# Patient Record
Sex: Female | Born: 1935
Health system: Southern US, Community
[De-identification: ages and names within clinical notes are randomized; demographics above are authoritative.]

## PROBLEM LIST (undated history)

## (undated) DIAGNOSIS — Z889 Allergy status to unspecified drugs, medicaments and biological substances status: Secondary | ICD-10-CM

## (undated) DIAGNOSIS — K589 Irritable bowel syndrome without diarrhea: Secondary | ICD-10-CM

## (undated) DIAGNOSIS — F419 Anxiety disorder, unspecified: Secondary | ICD-10-CM

## (undated) DIAGNOSIS — T7840XA Allergy, unspecified, initial encounter: Secondary | ICD-10-CM

## (undated) DIAGNOSIS — K5010 Crohn's disease of large intestine without complications: Secondary | ICD-10-CM

## (undated) DIAGNOSIS — R011 Cardiac murmur, unspecified: Secondary | ICD-10-CM

## (undated) DIAGNOSIS — I10 Essential (primary) hypertension: Secondary | ICD-10-CM

## (undated) DIAGNOSIS — I499 Cardiac arrhythmia, unspecified: Secondary | ICD-10-CM

## (undated) DIAGNOSIS — K52832 Lymphocytic colitis: Secondary | ICD-10-CM

## (undated) DIAGNOSIS — F32A Depression, unspecified: Secondary | ICD-10-CM

## (undated) DIAGNOSIS — F329 Major depressive disorder, single episode, unspecified: Secondary | ICD-10-CM

## (undated) DIAGNOSIS — I639 Cerebral infarction, unspecified: Secondary | ICD-10-CM

## (undated) DIAGNOSIS — K449 Diaphragmatic hernia without obstruction or gangrene: Secondary | ICD-10-CM

## (undated) DIAGNOSIS — K219 Gastro-esophageal reflux disease without esophagitis: Secondary | ICD-10-CM

## (undated) DIAGNOSIS — M1990 Unspecified osteoarthritis, unspecified site: Secondary | ICD-10-CM

## (undated) DIAGNOSIS — E119 Type 2 diabetes mellitus without complications: Secondary | ICD-10-CM

## (undated) DIAGNOSIS — G459 Transient cerebral ischemic attack, unspecified: Secondary | ICD-10-CM

## (undated) HISTORY — DX: Irritable bowel syndrome, unspecified: K58.9

## (undated) HISTORY — DX: Major depressive disorder, single episode, unspecified: F32.9

## (undated) HISTORY — DX: Type 2 diabetes mellitus without complications: E11.9

## (undated) HISTORY — DX: Gastro-esophageal reflux disease without esophagitis: K21.9

## (undated) HISTORY — DX: Crohn's disease of large intestine without complications: K50.10

## (undated) HISTORY — DX: Depression, unspecified: F32.A

## (undated) HISTORY — DX: Allergy, unspecified, initial encounter: T78.40XA

## (undated) HISTORY — DX: Essential (primary) hypertension: I10

## (undated) HISTORY — DX: Transient cerebral ischemic attack, unspecified: G45.9

## (undated) HISTORY — DX: Anxiety disorder, unspecified: F41.9

## (undated) HISTORY — DX: Cardiac murmur, unspecified: R01.1

## (undated) HISTORY — DX: Allergy status to unspecified drugs, medicaments and biological substances status: Z88.9

## (undated) HISTORY — DX: Diaphragmatic hernia without obstruction or gangrene: K44.9

## (undated) HISTORY — PX: ESOPHAGEAL MANOMETRY: SHX1526

## (undated) HISTORY — DX: Lymphocytic colitis: K52.832

## (undated) HISTORY — PX: CATARACT EXTRACTION, BILATERAL: SHX1313

## (undated) HISTORY — DX: Unspecified osteoarthritis, unspecified site: M19.90

## (undated) HISTORY — DX: Cerebral infarction, unspecified: I63.9

---

## 1990-11-24 HISTORY — PX: PARTIAL HYSTERECTOMY: SHX80

## 1993-11-24 HISTORY — PX: BLADDER REPAIR: SHX76

## 1993-11-24 HISTORY — PX: HERNIA REPAIR: SHX51

## 1997-11-24 HISTORY — PX: ANKLE SURGERY: SHX546

## 1998-07-31 ENCOUNTER — Ambulatory Visit (HOSPITAL_BASED_OUTPATIENT_CLINIC_OR_DEPARTMENT_OTHER): Admission: RE | Admit: 1998-07-31 | Discharge: 1998-07-31 | Payer: Self-pay | Admitting: Orthopedic Surgery

## 1998-08-21 ENCOUNTER — Other Ambulatory Visit: Admission: RE | Admit: 1998-08-21 | Discharge: 1998-08-21 | Payer: Self-pay | Admitting: Urology

## 1998-11-24 DIAGNOSIS — G459 Transient cerebral ischemic attack, unspecified: Secondary | ICD-10-CM

## 1998-11-24 HISTORY — DX: Transient cerebral ischemic attack, unspecified: G45.9

## 1999-06-27 ENCOUNTER — Other Ambulatory Visit: Admission: RE | Admit: 1999-06-27 | Discharge: 1999-06-27 | Payer: Self-pay | Admitting: Obstetrics and Gynecology

## 2000-07-01 ENCOUNTER — Other Ambulatory Visit: Admission: RE | Admit: 2000-07-01 | Discharge: 2000-07-01 | Payer: Self-pay | Admitting: Obstetrics and Gynecology

## 2001-06-28 ENCOUNTER — Other Ambulatory Visit: Admission: RE | Admit: 2001-06-28 | Discharge: 2001-06-28 | Payer: Self-pay | Admitting: Physical Therapy

## 2001-10-13 ENCOUNTER — Encounter: Payer: Self-pay | Admitting: Endocrinology

## 2001-10-13 ENCOUNTER — Ambulatory Visit (HOSPITAL_COMMUNITY): Admission: RE | Admit: 2001-10-13 | Discharge: 2001-10-13 | Payer: Self-pay | Admitting: Endocrinology

## 2002-09-01 ENCOUNTER — Encounter: Payer: Self-pay | Admitting: Family Medicine

## 2002-09-01 ENCOUNTER — Encounter: Admission: RE | Admit: 2002-09-01 | Discharge: 2002-09-01 | Payer: Self-pay | Admitting: Family Medicine

## 2002-11-24 HISTORY — PX: CHOLECYSTECTOMY: SHX55

## 2003-01-31 ENCOUNTER — Encounter: Admission: RE | Admit: 2003-01-31 | Discharge: 2003-01-31 | Payer: Self-pay | Admitting: Family Medicine

## 2003-01-31 ENCOUNTER — Encounter: Payer: Self-pay | Admitting: Family Medicine

## 2003-06-28 ENCOUNTER — Emergency Department (HOSPITAL_COMMUNITY): Admission: EM | Admit: 2003-06-28 | Discharge: 2003-06-28 | Payer: Self-pay | Admitting: Emergency Medicine

## 2003-12-29 ENCOUNTER — Ambulatory Visit (HOSPITAL_COMMUNITY): Admission: RE | Admit: 2003-12-29 | Discharge: 2003-12-29 | Payer: Self-pay | Admitting: Gastroenterology

## 2004-02-08 ENCOUNTER — Encounter (INDEPENDENT_AMBULATORY_CARE_PROVIDER_SITE_OTHER): Payer: Self-pay | Admitting: Gastroenterology

## 2004-02-08 DIAGNOSIS — K2940 Chronic atrophic gastritis without bleeding: Secondary | ICD-10-CM | POA: Insufficient documentation

## 2004-06-14 ENCOUNTER — Encounter (INDEPENDENT_AMBULATORY_CARE_PROVIDER_SITE_OTHER): Payer: Self-pay | Admitting: Specialist

## 2004-06-14 ENCOUNTER — Ambulatory Visit (HOSPITAL_COMMUNITY): Admission: RE | Admit: 2004-06-14 | Discharge: 2004-06-15 | Payer: Self-pay | Admitting: General Surgery

## 2004-09-16 ENCOUNTER — Other Ambulatory Visit: Admission: RE | Admit: 2004-09-16 | Discharge: 2004-09-16 | Payer: Self-pay | Admitting: Obstetrics and Gynecology

## 2004-11-05 ENCOUNTER — Ambulatory Visit: Payer: Self-pay | Admitting: Family Medicine

## 2004-12-11 ENCOUNTER — Ambulatory Visit: Payer: Self-pay | Admitting: Internal Medicine

## 2004-12-19 ENCOUNTER — Ambulatory Visit: Payer: Self-pay | Admitting: Internal Medicine

## 2005-01-10 ENCOUNTER — Ambulatory Visit: Payer: Self-pay | Admitting: Internal Medicine

## 2005-04-09 ENCOUNTER — Ambulatory Visit: Payer: Self-pay | Admitting: Internal Medicine

## 2005-06-05 IMAGING — RF DG CHOLANGIOGRAM OPERATIVE
1 series · 2 of 2 positions shown · non-contrast
Comparison: none

CLINICAL DATA: Laparoscopic cholecystectomy.  
 Laparoscopic cholangiogram
 Operative cholangiogram was performed.  A single C-arm spot film shows good visualization on the common bile duct after contrast was injected via the cystic duct.  There is flow of contrast into the duodenal loop and no obstruction is seen. 
 IMPRESSION
 Negative intraoperative cholangiogram.

[Series 1: run · 2 of 2 slices shown]
[im 1/2]
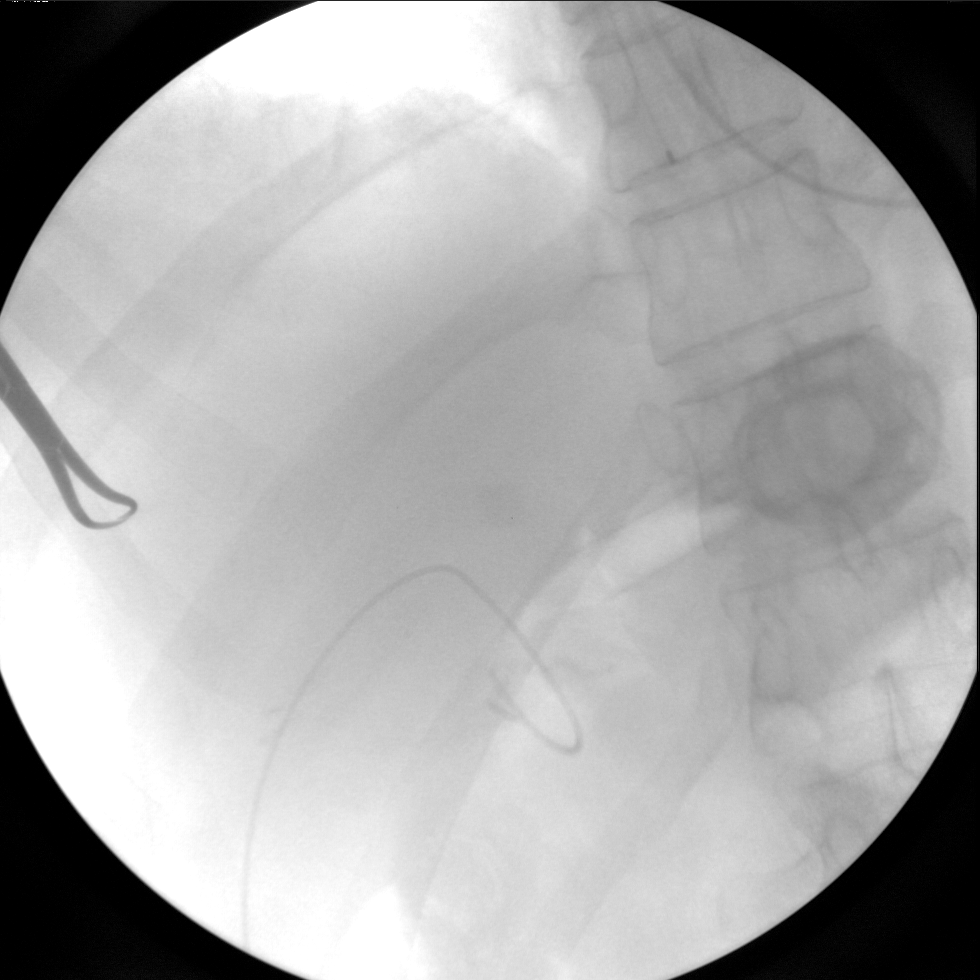
[im 2/2]
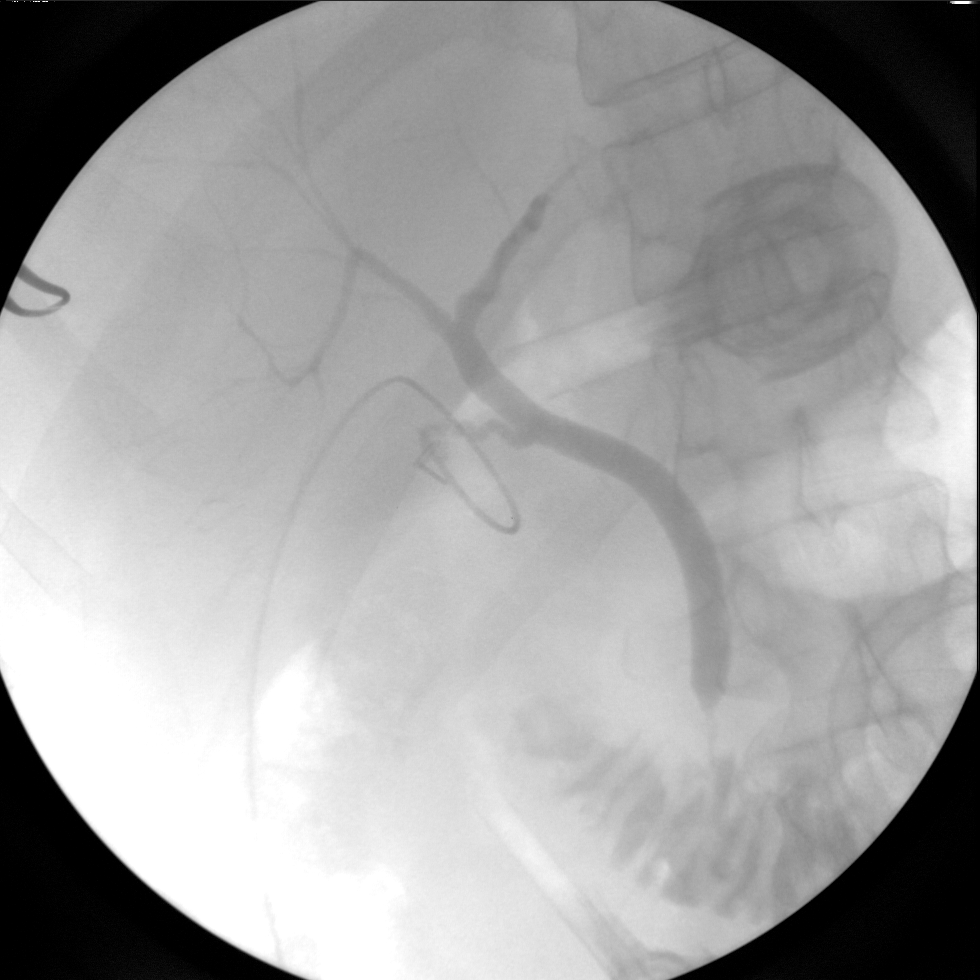

[2 of 2 positions shown; findings below may reference images not displayed]

## 2005-06-09 ENCOUNTER — Ambulatory Visit: Payer: Self-pay | Admitting: Internal Medicine

## 2005-06-20 ENCOUNTER — Ambulatory Visit: Payer: Self-pay | Admitting: Licensed Clinical Social Worker

## 2005-07-07 ENCOUNTER — Ambulatory Visit: Payer: Self-pay | Admitting: Internal Medicine

## 2005-07-21 ENCOUNTER — Ambulatory Visit: Payer: Self-pay | Admitting: Internal Medicine

## 2005-09-12 ENCOUNTER — Ambulatory Visit: Payer: Self-pay | Admitting: Family Medicine

## 2005-10-01 ENCOUNTER — Ambulatory Visit: Payer: Self-pay | Admitting: Family Medicine

## 2005-11-25 ENCOUNTER — Encounter: Admission: RE | Admit: 2005-11-25 | Discharge: 2006-02-23 | Payer: Self-pay | Admitting: Family Medicine

## 2005-12-01 ENCOUNTER — Ambulatory Visit: Payer: Self-pay | Admitting: Family Medicine

## 2006-01-29 ENCOUNTER — Ambulatory Visit: Payer: Self-pay | Admitting: Family Medicine

## 2006-02-02 ENCOUNTER — Ambulatory Visit: Payer: Self-pay | Admitting: Family Medicine

## 2006-06-29 ENCOUNTER — Ambulatory Visit: Payer: Self-pay | Admitting: Family Medicine

## 2006-09-18 ENCOUNTER — Ambulatory Visit: Payer: Self-pay | Admitting: Family Medicine

## 2006-10-20 ENCOUNTER — Ambulatory Visit: Payer: Self-pay | Admitting: Family Medicine

## 2006-10-20 ENCOUNTER — Encounter: Payer: Self-pay | Admitting: Internal Medicine

## 2006-11-03 ENCOUNTER — Ambulatory Visit: Payer: Self-pay | Admitting: Family Medicine

## 2006-12-30 ENCOUNTER — Ambulatory Visit: Payer: Self-pay | Admitting: Family Medicine

## 2006-12-30 LAB — CONVERTED CEMR LAB
ALT: 19 U/L
AST: 23 U/L
Albumin: 3.7 g/dL
Alkaline Phosphatase: 48 U/L
BUN: 9 mg/dL
Bilirubin, Direct: 0.1 mg/dL
CO2: 29 meq/L
Calcium: 9.2 mg/dL
Chloride: 106 meq/L
Creatinine, Ser: 0.6 mg/dL
GFR calc Af Amer: 127 mL/min
GFR calc non Af Amer: 105 mL/min
Glucose, Bld: 104 mg/dL — ABNORMAL HIGH
Hgb A1c MFr Bld: 6 %
Potassium: 4.8 meq/L
Sodium: 139 meq/L
Total Bilirubin: 0.5 mg/dL
Total Protein: 6.9 g/dL

## 2007-01-25 ENCOUNTER — Ambulatory Visit: Payer: Self-pay | Admitting: Family Medicine

## 2007-01-27 ENCOUNTER — Encounter: Payer: Self-pay | Admitting: Family Medicine

## 2007-01-30 DIAGNOSIS — IMO0002 Reserved for concepts with insufficient information to code with codable children: Secondary | ICD-10-CM | POA: Insufficient documentation

## 2007-01-30 DIAGNOSIS — R011 Cardiac murmur, unspecified: Secondary | ICD-10-CM

## 2007-01-30 DIAGNOSIS — I10 Essential (primary) hypertension: Secondary | ICD-10-CM

## 2007-01-30 DIAGNOSIS — K219 Gastro-esophageal reflux disease without esophagitis: Secondary | ICD-10-CM | POA: Insufficient documentation

## 2007-02-04 ENCOUNTER — Ambulatory Visit: Payer: Self-pay | Admitting: Family Medicine

## 2007-02-04 LAB — CONVERTED CEMR LAB
ALT: 18 units/L (ref 0–40)
AST: 22 units/L (ref 0–37)
Alkaline Phosphatase: 47 units/L (ref 39–117)
BUN: 9 mg/dL (ref 6–23)
Bilirubin, Direct: 0.1 mg/dL (ref 0.0–0.3)
CO2: 30 meq/L (ref 19–32)
Creatinine, Ser: 0.6 mg/dL (ref 0.4–1.2)
GFR calc non Af Amer: 105 mL/min
Glucose, Bld: 90 mg/dL (ref 70–99)
Hemoglobin: 14.3 g/dL (ref 12.0–15.0)
Lymphocytes Relative: 43 % (ref 12.0–46.0)
MCV: 89.9 fL (ref 78.0–100.0)
Neutrophils Relative %: 41.7 % — ABNORMAL LOW (ref 43.0–77.0)
Platelets: 288 10*3/uL (ref 150–400)
Potassium: 4.3 meq/L (ref 3.5–5.1)
RBC: 4.66 M/uL (ref 3.87–5.11)
RDW: 11.7 % (ref 11.5–14.6)
Sodium: 142 meq/L (ref 135–145)
TSH: 1.35 microintl units/mL (ref 0.35–5.50)
Total Bilirubin: 0.7 mg/dL (ref 0.3–1.2)

## 2007-02-23 ENCOUNTER — Ambulatory Visit: Payer: Self-pay | Admitting: Gastroenterology

## 2007-03-01 ENCOUNTER — Ambulatory Visit: Payer: Self-pay | Admitting: Family Medicine

## 2007-03-10 ENCOUNTER — Encounter: Payer: Self-pay | Admitting: Gastroenterology

## 2007-03-10 ENCOUNTER — Encounter (INDEPENDENT_AMBULATORY_CARE_PROVIDER_SITE_OTHER): Payer: Self-pay | Admitting: Specialist

## 2007-03-10 ENCOUNTER — Ambulatory Visit: Payer: Self-pay | Admitting: Gastroenterology

## 2007-03-10 DIAGNOSIS — K648 Other hemorrhoids: Secondary | ICD-10-CM | POA: Insufficient documentation

## 2007-03-10 DIAGNOSIS — D126 Benign neoplasm of colon, unspecified: Secondary | ICD-10-CM

## 2007-06-24 ENCOUNTER — Ambulatory Visit: Payer: Self-pay | Admitting: Family Medicine

## 2007-06-24 DIAGNOSIS — M79609 Pain in unspecified limb: Secondary | ICD-10-CM | POA: Insufficient documentation

## 2007-06-24 DIAGNOSIS — R42 Dizziness and giddiness: Secondary | ICD-10-CM

## 2007-06-25 LAB — CONVERTED CEMR LAB
Albumin: 3.8 g/dL (ref 3.5–5.2)
Alkaline Phosphatase: 54 units/L (ref 39–117)
BUN: 14 mg/dL (ref 6–23)
Basophils Relative: 0.3 % (ref 0.0–1.0)
CO2: 27 meq/L (ref 19–32)
Eosinophils Absolute: 0.1 10*3/uL (ref 0.0–0.6)
Eosinophils Relative: 1.8 % (ref 0.0–5.0)
GFR calc Af Amer: 127 mL/min
GFR calc non Af Amer: 105 mL/min
Glucose, Bld: 100 mg/dL — ABNORMAL HIGH (ref 70–99)
HCT: 39.9 % (ref 36.0–46.0)
HDL: 48.5 mg/dL (ref >39.0)
Hemoglobin: 14 g/dL (ref 12.0–15.0)
Neutrophils Relative %: 47.2 % (ref 43.0–77.0)
Platelets: 231 10*3/uL (ref 150–400)
Potassium: 4.2 meq/L (ref 3.5–5.1)
Total Bilirubin: 0.5 mg/dL (ref 0.3–1.2)
Total Protein: 6.7 g/dL (ref 6.0–8.3)
Triglycerides: 154 mg/dL — ABNORMAL HIGH (ref 0–149)

## 2007-09-21 ENCOUNTER — Ambulatory Visit: Payer: Self-pay | Admitting: Family Medicine

## 2007-10-11 ENCOUNTER — Emergency Department (HOSPITAL_COMMUNITY): Admission: EM | Admit: 2007-10-11 | Discharge: 2007-10-11 | Payer: Self-pay | Admitting: Unknown Physician Specialty

## 2007-10-14 ENCOUNTER — Ambulatory Visit: Payer: Self-pay | Admitting: Family Medicine

## 2007-10-14 DIAGNOSIS — J018 Other acute sinusitis: Secondary | ICD-10-CM | POA: Insufficient documentation

## 2007-10-19 ENCOUNTER — Encounter: Payer: Self-pay | Admitting: Family Medicine

## 2007-12-08 ENCOUNTER — Ambulatory Visit: Payer: Self-pay | Admitting: Family Medicine

## 2007-12-08 DIAGNOSIS — J069 Acute upper respiratory infection, unspecified: Secondary | ICD-10-CM | POA: Insufficient documentation

## 2007-12-09 ENCOUNTER — Telehealth (INDEPENDENT_AMBULATORY_CARE_PROVIDER_SITE_OTHER): Payer: Self-pay | Admitting: *Deleted

## 2007-12-17 ENCOUNTER — Encounter: Payer: Self-pay | Admitting: Family Medicine

## 2008-01-27 ENCOUNTER — Ambulatory Visit: Payer: Self-pay | Admitting: Family Medicine

## 2008-02-02 ENCOUNTER — Telehealth (INDEPENDENT_AMBULATORY_CARE_PROVIDER_SITE_OTHER): Payer: Self-pay | Admitting: *Deleted

## 2008-02-02 ENCOUNTER — Encounter (INDEPENDENT_AMBULATORY_CARE_PROVIDER_SITE_OTHER): Payer: Self-pay | Admitting: *Deleted

## 2008-02-23 ENCOUNTER — Encounter: Payer: Self-pay | Admitting: Internal Medicine

## 2008-03-17 ENCOUNTER — Encounter: Payer: Self-pay | Admitting: Internal Medicine

## 2008-03-17 ENCOUNTER — Ambulatory Visit: Payer: Self-pay | Admitting: Internal Medicine

## 2008-03-17 DIAGNOSIS — R1012 Left upper quadrant pain: Secondary | ICD-10-CM | POA: Insufficient documentation

## 2008-03-20 ENCOUNTER — Telehealth: Payer: Self-pay | Admitting: Internal Medicine

## 2008-04-13 ENCOUNTER — Telehealth (INDEPENDENT_AMBULATORY_CARE_PROVIDER_SITE_OTHER): Payer: Self-pay | Admitting: *Deleted

## 2008-05-11 ENCOUNTER — Telehealth (INDEPENDENT_AMBULATORY_CARE_PROVIDER_SITE_OTHER): Payer: Self-pay | Admitting: *Deleted

## 2008-05-25 ENCOUNTER — Telehealth (INDEPENDENT_AMBULATORY_CARE_PROVIDER_SITE_OTHER): Payer: Self-pay | Admitting: *Deleted

## 2008-05-29 ENCOUNTER — Telehealth (INDEPENDENT_AMBULATORY_CARE_PROVIDER_SITE_OTHER): Payer: Self-pay | Admitting: *Deleted

## 2008-06-02 ENCOUNTER — Ambulatory Visit: Payer: Self-pay | Admitting: Family Medicine

## 2008-06-15 ENCOUNTER — Encounter: Payer: Self-pay | Admitting: Family Medicine

## 2008-06-15 ENCOUNTER — Ambulatory Visit: Payer: Self-pay | Admitting: Internal Medicine

## 2008-06-15 ENCOUNTER — Telehealth (INDEPENDENT_AMBULATORY_CARE_PROVIDER_SITE_OTHER): Payer: Self-pay | Admitting: *Deleted

## 2008-06-15 DIAGNOSIS — R109 Unspecified abdominal pain: Secondary | ICD-10-CM

## 2008-06-19 ENCOUNTER — Telehealth: Payer: Self-pay | Admitting: Internal Medicine

## 2008-06-19 LAB — CONVERTED CEMR LAB: Lithium Lvl: <0.1 meq/L — ABNORMAL LOW (ref 0.80–1.40)

## 2008-06-29 ENCOUNTER — Ambulatory Visit: Payer: Self-pay | Admitting: Internal Medicine

## 2008-07-10 ENCOUNTER — Telehealth: Payer: Self-pay | Admitting: Internal Medicine

## 2008-07-11 ENCOUNTER — Ambulatory Visit: Payer: Self-pay | Admitting: Internal Medicine

## 2008-07-11 ENCOUNTER — Inpatient Hospital Stay (HOSPITAL_COMMUNITY): Admission: EM | Admit: 2008-07-11 | Discharge: 2008-07-12 | Payer: Self-pay | Admitting: Emergency Medicine

## 2008-07-12 ENCOUNTER — Ambulatory Visit: Payer: Self-pay | Admitting: Gastroenterology

## 2008-07-12 ENCOUNTER — Telehealth: Payer: Self-pay | Admitting: Internal Medicine

## 2008-07-12 ENCOUNTER — Encounter: Payer: Self-pay | Admitting: Internal Medicine

## 2008-07-13 ENCOUNTER — Telehealth: Payer: Self-pay | Admitting: Internal Medicine

## 2008-07-17 ENCOUNTER — Telehealth: Payer: Self-pay | Admitting: Internal Medicine

## 2008-07-18 ENCOUNTER — Ambulatory Visit: Payer: Self-pay | Admitting: Internal Medicine

## 2008-07-18 DIAGNOSIS — M159 Polyosteoarthritis, unspecified: Secondary | ICD-10-CM

## 2008-07-18 LAB — CONVERTED CEMR LAB
AST: 32 units/L (ref 0–37)
Albumin: 3.7 g/dL (ref 3.5–5.2)
Alkaline Phosphatase: 41 units/L (ref 39–117)
Amylase: 60 units/L (ref 27–131)
Basophils Absolute: 0 10*3/uL (ref 0.0–0.1)
Basophils Relative: 0.8 % (ref 0.0–3.0)
Bilirubin, Direct: 0.2 mg/dL (ref 0.0–0.3)
CO2: 29 meq/L (ref 19–32)
Creatinine, Ser: 0.7 mg/dL (ref 0.4–1.2)
Eosinophils Absolute: 0.4 10*3/uL (ref 0.0–0.7)
Eosinophils Relative: 7.4 % — ABNORMAL HIGH (ref 0.0–5.0)
GFR calc Af Amer: 106 mL/min
GFR calc non Af Amer: 87 mL/min
Lymphocytes Relative: 40.9 % (ref 12.0–46.0)
Monocytes Relative: 13 % — ABNORMAL HIGH (ref 3.0–12.0)
Neutro Abs: 2.2 10*3/uL (ref 1.4–7.7)
Neutrophils Relative %: 37.9 % — ABNORMAL LOW (ref 43.0–77.0)
Potassium: 4.1 meq/L (ref 3.5–5.1)
Sodium: 142 meq/L (ref 135–145)
Total Protein: 6.8 g/dL (ref 6.0–8.3)

## 2008-07-19 ENCOUNTER — Ambulatory Visit: Payer: Self-pay | Admitting: Gastroenterology

## 2008-07-19 ENCOUNTER — Encounter: Payer: Self-pay | Admitting: Internal Medicine

## 2008-07-19 DIAGNOSIS — K209 Esophagitis, unspecified without bleeding: Secondary | ICD-10-CM | POA: Insufficient documentation

## 2008-07-19 DIAGNOSIS — Z860100 Personal history of colon polyps, unspecified: Secondary | ICD-10-CM | POA: Insufficient documentation

## 2008-07-19 DIAGNOSIS — K52832 Lymphocytic colitis: Secondary | ICD-10-CM | POA: Insufficient documentation

## 2008-07-19 DIAGNOSIS — R12 Heartburn: Secondary | ICD-10-CM

## 2008-07-19 DIAGNOSIS — R1319 Other dysphagia: Secondary | ICD-10-CM

## 2008-07-24 ENCOUNTER — Ambulatory Visit: Payer: Self-pay | Admitting: Internal Medicine

## 2008-07-25 ENCOUNTER — Ambulatory Visit: Payer: Self-pay | Admitting: Internal Medicine

## 2008-07-25 DIAGNOSIS — K559 Vascular disorder of intestine, unspecified: Secondary | ICD-10-CM | POA: Insufficient documentation

## 2008-08-14 ENCOUNTER — Telehealth (INDEPENDENT_AMBULATORY_CARE_PROVIDER_SITE_OTHER): Payer: Self-pay | Admitting: *Deleted

## 2008-08-25 ENCOUNTER — Ambulatory Visit: Payer: Self-pay | Admitting: Internal Medicine

## 2008-08-25 DIAGNOSIS — R000 Tachycardia, unspecified: Secondary | ICD-10-CM

## 2008-08-29 ENCOUNTER — Telehealth: Payer: Self-pay | Admitting: Internal Medicine

## 2008-09-08 ENCOUNTER — Ambulatory Visit: Payer: Self-pay | Admitting: Internal Medicine

## 2008-09-15 ENCOUNTER — Encounter: Payer: Self-pay | Admitting: Internal Medicine

## 2008-09-19 ENCOUNTER — Ambulatory Visit: Payer: Self-pay

## 2008-09-19 ENCOUNTER — Encounter: Payer: Self-pay | Admitting: Internal Medicine

## 2008-10-02 ENCOUNTER — Ambulatory Visit: Payer: Self-pay | Admitting: Internal Medicine

## 2008-10-30 ENCOUNTER — Encounter: Payer: Self-pay | Admitting: Internal Medicine

## 2008-11-16 ENCOUNTER — Encounter: Payer: Self-pay | Admitting: Internal Medicine

## 2008-12-01 ENCOUNTER — Ambulatory Visit: Payer: Self-pay | Admitting: Internal Medicine

## 2008-12-18 ENCOUNTER — Telehealth (INDEPENDENT_AMBULATORY_CARE_PROVIDER_SITE_OTHER): Payer: Self-pay | Admitting: *Deleted

## 2009-01-05 ENCOUNTER — Telehealth: Payer: Self-pay | Admitting: Internal Medicine

## 2009-01-08 ENCOUNTER — Ambulatory Visit: Payer: Self-pay | Admitting: Internal Medicine

## 2009-01-09 ENCOUNTER — Ambulatory Visit: Payer: Self-pay | Admitting: Gastroenterology

## 2009-01-09 DIAGNOSIS — R1013 Epigastric pain: Secondary | ICD-10-CM

## 2009-01-09 DIAGNOSIS — Z888 Allergy status to other drugs, medicaments and biological substances status: Secondary | ICD-10-CM

## 2009-01-10 DIAGNOSIS — K222 Esophageal obstruction: Secondary | ICD-10-CM

## 2009-01-29 ENCOUNTER — Ambulatory Visit: Payer: Self-pay | Admitting: Internal Medicine

## 2009-01-29 LAB — CONVERTED CEMR LAB
CO2: 27 meq/L (ref 19–32)
Calcium: 8.9 mg/dL (ref 8.4–10.5)
Chloride: 103 meq/L (ref 96–112)
GFR calc non Af Amer: 104 mL/min
Glucose, Bld: 95 mg/dL (ref 70–99)

## 2009-02-08 ENCOUNTER — Ambulatory Visit: Payer: Self-pay | Admitting: Internal Medicine

## 2009-03-07 ENCOUNTER — Telehealth: Payer: Self-pay | Admitting: Internal Medicine

## 2009-03-14 ENCOUNTER — Ambulatory Visit: Payer: Self-pay | Admitting: Internal Medicine

## 2009-04-10 ENCOUNTER — Ambulatory Visit: Payer: Self-pay | Admitting: Internal Medicine

## 2009-05-22 ENCOUNTER — Telehealth: Payer: Self-pay | Admitting: Internal Medicine

## 2009-05-24 ENCOUNTER — Ambulatory Visit: Payer: Self-pay | Admitting: Internal Medicine

## 2009-05-24 DIAGNOSIS — R413 Other amnesia: Secondary | ICD-10-CM

## 2009-05-24 DIAGNOSIS — G4700 Insomnia, unspecified: Secondary | ICD-10-CM

## 2009-05-25 ENCOUNTER — Telehealth: Payer: Self-pay | Admitting: Internal Medicine

## 2009-06-04 ENCOUNTER — Telehealth: Payer: Self-pay | Admitting: Family Medicine

## 2009-06-19 ENCOUNTER — Ambulatory Visit: Payer: Self-pay | Admitting: Internal Medicine

## 2009-06-25 ENCOUNTER — Telehealth: Payer: Self-pay | Admitting: Internal Medicine

## 2009-07-02 ENCOUNTER — Telehealth: Payer: Self-pay | Admitting: Internal Medicine

## 2009-07-13 LAB — CONVERTED CEMR LAB
Albumin: 4.4 g/dL (ref 3.5–5.2)
Alkaline Phosphatase: 48 units/L (ref 39–117)
BUN: 11 mg/dL (ref 6–23)
CO2: 21 meq/L (ref 19–32)
Chloride: 105 meq/L (ref 96–112)
Free T4: 1.1 ng/dL (ref 0.80–1.80)
Glucose, Bld: 105 mg/dL — ABNORMAL HIGH (ref 70–99)
Indirect Bilirubin: 0.3 mg/dL (ref 0.0–0.9)
Potassium: 4.5 meq/L (ref 3.5–5.3)
Total Protein: 7.2 g/dL (ref 6.0–8.3)
Vitamin B-12: 709 pg/mL (ref 211–911)

## 2009-07-16 ENCOUNTER — Ambulatory Visit: Payer: Self-pay | Admitting: Internal Medicine

## 2009-08-21 ENCOUNTER — Encounter: Payer: Self-pay | Admitting: Internal Medicine

## 2009-08-22 ENCOUNTER — Encounter: Payer: Self-pay | Admitting: Internal Medicine

## 2009-09-13 ENCOUNTER — Ambulatory Visit: Payer: Self-pay | Admitting: Internal Medicine

## 2009-09-13 ENCOUNTER — Telehealth: Payer: Self-pay | Admitting: Internal Medicine

## 2009-09-13 DIAGNOSIS — J309 Allergic rhinitis, unspecified: Secondary | ICD-10-CM | POA: Insufficient documentation

## 2009-09-26 ENCOUNTER — Ambulatory Visit: Payer: Self-pay | Admitting: Internal Medicine

## 2009-09-27 ENCOUNTER — Encounter: Payer: Self-pay | Admitting: Internal Medicine

## 2009-10-15 ENCOUNTER — Telehealth: Payer: Self-pay | Admitting: Internal Medicine

## 2009-10-15 ENCOUNTER — Ambulatory Visit (HOSPITAL_COMMUNITY): Admission: RE | Admit: 2009-10-15 | Discharge: 2009-10-15 | Payer: Self-pay | Admitting: Internal Medicine

## 2009-10-15 ENCOUNTER — Encounter: Payer: Self-pay | Admitting: Internal Medicine

## 2009-10-17 ENCOUNTER — Ambulatory Visit (HOSPITAL_COMMUNITY): Admission: RE | Admit: 2009-10-17 | Discharge: 2009-10-17 | Payer: Self-pay | Admitting: Internal Medicine

## 2009-10-22 ENCOUNTER — Telehealth: Payer: Self-pay | Admitting: Internal Medicine

## 2009-10-29 ENCOUNTER — Telehealth: Payer: Self-pay | Admitting: Internal Medicine

## 2009-10-30 ENCOUNTER — Ambulatory Visit: Payer: Self-pay | Admitting: Internal Medicine

## 2009-12-17 ENCOUNTER — Ambulatory Visit: Payer: Self-pay | Admitting: Internal Medicine

## 2009-12-17 ENCOUNTER — Encounter (INDEPENDENT_AMBULATORY_CARE_PROVIDER_SITE_OTHER): Payer: Self-pay | Admitting: *Deleted

## 2009-12-17 DIAGNOSIS — H6980 Other specified disorders of Eustachian tube, unspecified ear: Secondary | ICD-10-CM

## 2009-12-24 ENCOUNTER — Encounter: Payer: Self-pay | Admitting: Internal Medicine

## 2009-12-26 ENCOUNTER — Telehealth: Payer: Self-pay | Admitting: Internal Medicine

## 2009-12-27 ENCOUNTER — Ambulatory Visit (HOSPITAL_BASED_OUTPATIENT_CLINIC_OR_DEPARTMENT_OTHER): Admission: RE | Admit: 2009-12-27 | Discharge: 2009-12-27 | Payer: Self-pay | Admitting: Internal Medicine

## 2009-12-27 ENCOUNTER — Ambulatory Visit: Payer: Self-pay | Admitting: Radiology

## 2009-12-27 ENCOUNTER — Telehealth: Payer: Self-pay | Admitting: Internal Medicine

## 2009-12-27 ENCOUNTER — Ambulatory Visit: Payer: Self-pay | Admitting: Internal Medicine

## 2009-12-27 DIAGNOSIS — R0789 Other chest pain: Secondary | ICD-10-CM

## 2009-12-27 LAB — CONVERTED CEMR LAB
BUN: 12 mg/dL (ref 6–23)
Basophils Absolute: 0 10*3/uL (ref 0.0–0.1)
CO2: 23 meq/L (ref 19–32)
Calcium: 9.2 mg/dL (ref 8.4–10.5)
Chloride: 104 meq/L (ref 96–112)
Eosinophils Absolute: 0.1 10*3/uL (ref 0.0–0.7)
Eosinophils Relative: 1 % (ref 0–5)
Glucose, Bld: 98 mg/dL (ref 70–99)
HCT: 44.5 % (ref 36.0–46.0)
MCV: 90.8 fL (ref 78.0–100.0)
Monocytes Absolute: 0.7 10*3/uL (ref 0.1–1.0)
Neutro Abs: 4.4 10*3/uL (ref 1.7–7.7)
Neutrophils Relative %: 54 % (ref 43–77)
Potassium: 4.4 meq/L (ref 3.5–5.3)
RBC: 4.9 M/uL (ref 3.87–5.11)
RDW: 13.1 % (ref 11.5–15.5)
Sodium: 139 meq/L (ref 135–145)

## 2009-12-28 ENCOUNTER — Ambulatory Visit: Payer: Self-pay | Admitting: Diagnostic Radiology

## 2009-12-28 ENCOUNTER — Ambulatory Visit (HOSPITAL_BASED_OUTPATIENT_CLINIC_OR_DEPARTMENT_OTHER): Admission: RE | Admit: 2009-12-28 | Discharge: 2009-12-28 | Payer: Self-pay | Admitting: Internal Medicine

## 2010-01-02 ENCOUNTER — Ambulatory Visit: Payer: Self-pay | Admitting: Internal Medicine

## 2010-01-10 ENCOUNTER — Ambulatory Visit: Payer: Self-pay | Admitting: Internal Medicine

## 2010-01-14 ENCOUNTER — Telehealth (INDEPENDENT_AMBULATORY_CARE_PROVIDER_SITE_OTHER): Payer: Self-pay | Admitting: Radiology

## 2010-01-16 ENCOUNTER — Telehealth: Payer: Self-pay | Admitting: Internal Medicine

## 2010-01-17 ENCOUNTER — Telehealth: Payer: Self-pay | Admitting: Internal Medicine

## 2010-01-22 ENCOUNTER — Telehealth (INDEPENDENT_AMBULATORY_CARE_PROVIDER_SITE_OTHER): Payer: Self-pay | Admitting: *Deleted

## 2010-01-22 ENCOUNTER — Ambulatory Visit: Payer: Self-pay | Admitting: Internal Medicine

## 2010-01-22 ENCOUNTER — Telehealth: Payer: Self-pay | Admitting: Internal Medicine

## 2010-01-22 LAB — CONVERTED CEMR LAB
Basophils Absolute: 0 10*3/uL (ref 0.0–0.1)
Basophils Relative: 1 % (ref 0–1)
Bilirubin Urine: NEGATIVE
Bilirubin, Direct: 0.1 mg/dL (ref 0.0–0.3)
Chloride: 107 meq/L (ref 96–112)
Cholesterol: 163 mg/dL (ref 0–200)
Glucose, Bld: 95 mg/dL (ref 70–99)
HCT: 39.8 % (ref 36.0–46.0)
Indirect Bilirubin: 0.2 mg/dL (ref 0.0–0.9)
LDL Cholesterol: 73 mg/dL (ref 0–99)
Leukocytes, UA: NEGATIVE
Monocytes Absolute: 0.7 10*3/uL (ref 0.1–1.0)
Neutrophils Relative %: 37 % — ABNORMAL LOW (ref 43–77)
Nitrite: NEGATIVE
Protein, ur: NEGATIVE mg/dL
RDW: 13.1 % (ref 11.5–15.5)
Sodium: 141 meq/L (ref 135–145)
Total Bilirubin: 0.3 mg/dL (ref 0.3–1.2)
Total CHOL/HDL Ratio: 3.1
Triglycerides: 189 mg/dL — ABNORMAL HIGH (ref <150)
Urine Glucose: NEGATIVE mg/dL
Urobilinogen, UA: 0.2 (ref 0.0–1.0)
VLDL: 38 mg/dL (ref 0–40)

## 2010-01-23 ENCOUNTER — Telehealth (INDEPENDENT_AMBULATORY_CARE_PROVIDER_SITE_OTHER): Payer: Self-pay | Admitting: *Deleted

## 2010-01-24 ENCOUNTER — Encounter (HOSPITAL_COMMUNITY): Admission: RE | Admit: 2010-01-24 | Discharge: 2010-03-26 | Payer: Self-pay | Admitting: *Deleted

## 2010-01-24 ENCOUNTER — Ambulatory Visit: Payer: Self-pay

## 2010-01-24 ENCOUNTER — Ambulatory Visit: Payer: Self-pay | Admitting: Internal Medicine

## 2010-01-30 ENCOUNTER — Ambulatory Visit: Payer: Self-pay | Admitting: Internal Medicine

## 2010-01-31 ENCOUNTER — Telehealth: Payer: Self-pay | Admitting: Internal Medicine

## 2010-01-31 DIAGNOSIS — E1149 Type 2 diabetes mellitus with other diabetic neurological complication: Secondary | ICD-10-CM

## 2010-02-11 ENCOUNTER — Ambulatory Visit: Payer: Self-pay | Admitting: Internal Medicine

## 2010-02-11 DIAGNOSIS — IMO0001 Reserved for inherently not codable concepts without codable children: Secondary | ICD-10-CM

## 2010-03-27 ENCOUNTER — Encounter: Admission: RE | Admit: 2010-03-27 | Discharge: 2010-03-27 | Payer: Self-pay | Admitting: Internal Medicine

## 2010-03-27 ENCOUNTER — Encounter: Payer: Self-pay | Admitting: Internal Medicine

## 2010-03-28 ENCOUNTER — Telehealth: Payer: Self-pay | Admitting: Internal Medicine

## 2010-04-03 LAB — HM PAP SMEAR

## 2010-04-03 LAB — HM MAMMOGRAPHY: HM Mammogram: NORMAL

## 2010-04-24 ENCOUNTER — Encounter: Payer: Self-pay | Admitting: Internal Medicine

## 2010-04-24 LAB — CONVERTED CEMR LAB
Calcium: 9.7 mg/dL (ref 8.4–10.5)
Chloride: 105 meq/L (ref 96–112)
Creatinine, Ser: 0.6 mg/dL (ref 0.40–1.20)

## 2010-05-01 ENCOUNTER — Ambulatory Visit: Payer: Self-pay | Admitting: Internal Medicine

## 2010-05-06 ENCOUNTER — Telehealth: Payer: Self-pay | Admitting: Internal Medicine

## 2010-05-14 ENCOUNTER — Telehealth: Payer: Self-pay | Admitting: Internal Medicine

## 2010-07-01 ENCOUNTER — Telehealth: Payer: Self-pay | Admitting: Internal Medicine

## 2010-07-30 ENCOUNTER — Ambulatory Visit: Payer: Self-pay | Admitting: Diagnostic Radiology

## 2010-07-30 ENCOUNTER — Ambulatory Visit (HOSPITAL_BASED_OUTPATIENT_CLINIC_OR_DEPARTMENT_OTHER): Admission: RE | Admit: 2010-07-30 | Discharge: 2010-07-30 | Payer: Self-pay | Admitting: Internal Medicine

## 2010-07-30 ENCOUNTER — Ambulatory Visit: Payer: Self-pay | Admitting: Internal Medicine

## 2010-07-30 DIAGNOSIS — R519 Headache, unspecified: Secondary | ICD-10-CM | POA: Insufficient documentation

## 2010-07-30 DIAGNOSIS — R51 Headache: Secondary | ICD-10-CM

## 2010-07-30 LAB — CONVERTED CEMR LAB
CO2: 22 meq/L (ref 19–32)
Chloride: 104 meq/L (ref 96–112)
IgE (Immunoglobulin E), Serum: 4.2 intl units/mL (ref 0.0–180.0)
Sed Rate: 4 mm/hr (ref 0–22)

## 2010-08-01 ENCOUNTER — Telehealth: Payer: Self-pay | Admitting: Internal Medicine

## 2010-08-09 ENCOUNTER — Telehealth: Payer: Self-pay | Admitting: Internal Medicine

## 2010-08-15 ENCOUNTER — Ambulatory Visit: Payer: Self-pay | Admitting: Internal Medicine

## 2010-10-16 ENCOUNTER — Telehealth: Payer: Self-pay | Admitting: Internal Medicine

## 2010-10-21 ENCOUNTER — Telehealth: Payer: Self-pay | Admitting: Internal Medicine

## 2010-10-24 ENCOUNTER — Encounter: Payer: Self-pay | Admitting: Internal Medicine

## 2010-10-24 LAB — CONVERTED CEMR LAB
CO2: 22 meq/L (ref 19–32)
Glucose, Bld: 103 mg/dL — ABNORMAL HIGH (ref 70–99)
Hgb A1c MFr Bld: 5.8 % — ABNORMAL HIGH (ref <5.7)
Potassium: 4.3 meq/L (ref 3.5–5.3)
Sodium: 137 meq/L (ref 135–145)

## 2010-10-31 ENCOUNTER — Ambulatory Visit: Payer: Self-pay | Admitting: Internal Medicine

## 2010-11-11 ENCOUNTER — Ambulatory Visit: Payer: Self-pay | Admitting: Internal Medicine

## 2010-11-11 DIAGNOSIS — F341 Dysthymic disorder: Secondary | ICD-10-CM | POA: Insufficient documentation

## 2010-11-24 DIAGNOSIS — K52832 Lymphocytic colitis: Secondary | ICD-10-CM

## 2010-11-24 HISTORY — DX: Lymphocytic colitis: K52.832

## 2010-12-03 ENCOUNTER — Telehealth: Payer: Self-pay | Admitting: Internal Medicine

## 2010-12-05 ENCOUNTER — Ambulatory Visit: Admit: 2010-12-05 | Payer: Self-pay | Admitting: Internal Medicine

## 2010-12-22 LAB — CONVERTED CEMR LAB
BUN: 10 mg/dL (ref 6–23)
BUN: 11 mg/dL (ref 6–23)
Basophils Absolute: 0.1 10*3/uL (ref 0.0–0.1)
Basophils Relative: 1 % (ref 0.0–3.0)
Bilirubin Urine: NEGATIVE
CO2: 23 meq/L (ref 19–32)
Chloride: 102 meq/L (ref 96–112)
Creatinine, Ser: 0.5 mg/dL (ref 0.4–1.2)
Creatinine, Ser: 0.6 mg/dL (ref 0.4–1.2)
Eosinophils Absolute: 0 10*3/uL (ref 0.0–0.7)
Eosinophils Relative: 0.3 % (ref 0.0–5.0)
Glucose, Bld: 147 mg/dL — ABNORMAL HIGH (ref 70–99)
Lymphocytes Relative: 19.7 % (ref 12.0–46.0)
Magnesium: 2.3 mg/dL (ref 1.5–2.5)
Monocytes Absolute: 0.5 10*3/uL (ref 0.1–1.0)
Monocytes Relative: 4.9 % (ref 3.0–12.0)
Neutro Abs: 8.4 10*3/uL — ABNORMAL HIGH (ref 1.4–7.7)
Nitrite: NEGATIVE
Potassium: 3.7 meq/L (ref 3.5–5.1)
Protein, U semiquant: 30
Specific Gravity, Urine: 1.02
TSH: 0.73 microintl units/mL (ref 0.35–5.50)
WBC: 11.2 10*3/uL — ABNORMAL HIGH (ref 4.5–10.5)
pH: 5

## 2010-12-26 NOTE — Assessment & Plan Note (Signed)
Summary: chest pain/sheri   History of Present Illness Visit Type: Follow-up Visit Primary GI MD: Stan Head MD Primary Provider: Dondra Spry DO Requesting Provider: n/a Chief Complaint: Epigastric pain, and chest pain  History of Present Illness:   12/28 she had right foot surgery EKG performed, anesthesiologist was rude to her and made the RN change a "new device" This was upsetting to her Since then she had nocturnal urination and then returned to bed with diffuse body aches of extremities and chest, neck and even her head. She had to sleep with foot up x 6 weeks. Unable to bear weight x 6 weeks.  She lost her hearing for  a week.  She saw Dr. Artist Pais and allred (stress test and EKG ok).  Still has painful swallowing at initiation but ok subsequently. "Things tok a toll on my nerves" They told me to see you.  Last 2 nights have been ok.    GI Review of Systems    Reports abdominal pain and  chest pain.     Location of  Abdominal pain: epigastric area.    Denies acid reflux, belching, bloating, dysphagia with liquids, dysphagia with solids, heartburn, loss of appetite, nausea, vomiting, vomiting blood, weight loss, and  weight gain.        Denies anal fissure, black tarry stools, change in bowel habit, constipation, diarrhea, diverticulosis, fecal incontinence, heme positive stool, hemorrhoids, irritable bowel syndrome, jaundice, light color stool, liver problems, rectal bleeding, and  rectal pain.    Current Medications (verified): 1)  Spironolactone 25 Mg  Tabs (Spironolactone) .... Take 1 Tablet By Mouth Once A Day 2)  Diovan 320 Mg  Tabs (Valsartan) .... Take 1 Tablet By Mouth Once A Day 3)  Premarin 1.25 Mg Tabs (Estrogens Conjugated) .... Take 1 Tablet By Mouth Once A Day 4)  Librium 5 Mg  Caps (Chlordiazepoxide Hcl) .... Take 3 Tablets By Mouth Two Times A Day 5)  Fish Oil 1000 Mg  Caps (Omega-3 Fatty Acids) .... Take 1 Tablet Every 6 Hours 6)  Citrucel 500 Mg Tabs  (Methylcellulose (Laxative)) .... Take 1 Tablet By Mouth Once Daily 7)  Vitamin D 2000 Unit Tabs (Cholecalciferol) .... Take 1 Tablet By Mouth Once A Day 8)  Flexeril 10 Mg Tabs (Cyclobenzaprine Hcl) .... Take 2 Tablets By Mouth Once Daily As Needed 9)  Multivitamins  Tabs (Multiple Vitamin) .... 2 Tablets By Mouth Once Daily 10)  Prilosec 20 Mg Cpdr (Omeprazole) .... Take 1/2 Tablet By Mouth As Needed 11)  Prevalite 4 Gm Pack (Cholestyramine Light) .Marland Kitchen.. 1 Pack At Bedtime May Need To Hold A Dose If Constipation Occurs 12)  Maalox Regular Strength 200-200-20 Mg/57ml Susp (Alum & Mag Hydroxide-Simeth) .... As Needed  Allergies (verified): 1)  ! * Azithromycin 2)  ! Xanax 3)  ! * Lamictal 4)  ! Depakote 5)  ! * Tranxene 6)  ! * Trimetho/sulamethox 7)  ! * Carafate 8)  ! * Ketorolac 9)  ! * Metronidazole 10)  ! Cipro 11)  ! Benicar (Olmesartan Medoxomil) 12)  ! Doxycycline 13)  ! * Hyomax 14)  ! * Lomotil 15)  ! Percocet 16)  ! Hydrochlorothiazide 17)  ! * Bextra 18)  ! * Ultracet 19)  ! Prevacid 20)  ! Ativan 21)  ! * Ritalin 22)  ! * Quetiapine 23)  ! * Seroquel 24)  ! Prozac 25)  ! * Tivan 26)  ! Keflex 27)  ! * Hydroduril 28)  ! *  Abilify 29)  ! * Pristiq 30)  ! * Remeron 31)  ! Imitrex 32)  ! Lortab 33)  ! * Verpamil 34)  ! Relafen 35)  ! Lasix 36)  ! Vioxx 37)  ! * Lovox 38)  ! * Claindent 39)  ! * Estratest 40)  ! Celebrex 41)  ! * Mobic 42)  ! Celexa 43)  ! Entex 44)  ! Aciphex 45)  ! Zestril 46)  ! Adalat 47)  ! * Provera 48)  ! Wellbutrin 49)  ! Effexor 50)  ! Paxil 51)  ! * Ketek 52)  ! Clonidine Hcl 53)  ! * Serzone 54)  ! * Protonix 55)  Topamax Sprinkle (Topiramate) 56)  Norvasc (Amlodipine Besylate) 57)  Zoloft (Sertraline Hcl) 58)  Cefaclor (Cefaclor) 59)  * Zithromycin  Past History:  Past Medical History: Reviewed history from 01/30/2010 and no changes required. Anxiety/Depression (?Bipolar) GERD Hypertension      HL ? TIA 2000      Osteoarthritis IBS Segmental iscemic colitis 8/09     Past Surgical History: Reviewed history from 01/30/2010 and no changes required. Bladder Repair 1995  Cholecystectomy 2004    Hernia Surgery 1995y  Hysterectomy - partial 1992  Right Ankle fracture surgery 1999   Cataract Extraction - bilateral           Family History: Reviewed history from 01/30/2010 and no changes required. No FH of Colon Cancer: Family History of Diabetes: mother, aunts, uncles Family History of Heart Disease: father, aunt            Social History: Reviewed history from 01/30/2010 and no changes required. Occupation: retired Patient is a former smoker. -stopped 1962 Alcohol Use - no  Illicit Drug Use - no      Daily Caffeine Use  -1 cup daily     Vital Signs:  Patient profile:   77 year old female Height:      65.5 inches Weight:      184 pounds BMI:     30.26 BSA:     1.92 Pulse rate:   72 / minute Pulse rhythm:   regular BP sitting:   130 / 62  (left arm) Cuff size:   regular  Vitals Entered By: Ok Anis CMA (February 11, 2010 4:05 PM)  Physical Exam  General:  alert, well-developed, and well-nourished.   Psych:  mildly anxious   Impression & Recommendations:  Problem # 1:  MYALGIA (ICD-729.1) Occurs only after sleeping, noticed when she awakens to urinate. Began soon if not immediately after foot surgery and general anesthesia. She was upset by comments anesthesiologist made. One can get post-anesthesia muscle pain but should be more constat I think possibly relted to sleeping position, but not a gastrointestinal issue. She just got a new mattress. No problems x 2 nights so maybe gone? 12 medications and 59 allergies on the list make additional therapy difficult would observe, I suggested she consdier massage therapy f/u GI prn  Patient Instructions: 1)  Copy sent to : Thomos Lemons, DO 2)  Please continue current medications.  3)  Please schedule a follow-up appointment as  needed.  4)  The medication list was reviewed and reconciled.  All changed / newly prescribed medications were explained.  A complete medication list was provided to the patient / caregiver. cc: Dr. Hillis Range

## 2010-12-26 NOTE — Assessment & Plan Note (Signed)
Summary: 1 wk. f/u - jr   Vital Signs:  Patient profile:   77 year old female Weight:      184 pounds BMI:     30.26 O2 Sat:      97 % on Room air Temp:     97.6 degrees F oral Pulse rate:   74 / minute Pulse rhythm:   regular Resp:     18 per minute BP sitting:   130 / 80  (left arm) Cuff size:   large  Vitals Entered By: Glendell Docker CMA (January 02, 2010 10:11 AM)  O2 Flow:  Room air  Primary Care Provider:  D. Thomos Lemons DO  CC:  1 Week Follow up.  History of Present Illness: 1 Week Follow up   77 y/o white female for f/u chest/back pain improved but not completedly resolved cxr noted ectasia of aortic aorta.  CT of  chest w/cm negative for dissection        No acute chest pathology.  Mild linear scarring in both lowerlungs.      Mild atherosclerosis of the aorta.  Maximal diameter of the      ascending aorta is 3.7 cm.  No dissection.   CBC, BMET - normal  She has been taking vinegar tabs which she stopped. Pt not able to take omeprazole regularly because of bloating and diarrehea  Allergies: 1)  ! * Azithromycin 2)  ! Xanax 3)  ! * Lamictal 4)  ! Depakote 5)  ! * Tranxene 6)  ! * Trimetho/sulamethox 7)  ! * Carafate 8)  ! * Ketorolac 9)  ! * Metronidazole 10)  ! Cipro 11)  ! Benicar (Olmesartan Medoxomil) 12)  ! Doxycycline 13)  ! * Hyomax 14)  ! * Lomotil 15)  ! Percocet 16)  ! Hydrochlorothiazide 17)  ! * Bextra 18)  ! * Ultracet 19)  ! Prevacid 20)  ! Ativan 21)  ! * Ritalin 22)  ! * Quetiapine 23)  ! * Seroquel 24)  ! Prozac 25)  ! * Tivan 26)  ! Keflex 27)  ! * Hydroduril 28)  ! * Abilify 29)  ! * Pristiq 30)  ! * Remeron 31)  ! Imitrex 32)  ! Lortab 33)  ! * Verpamil 34)  ! Relafen 35)  ! Lasix 36)  ! Vioxx 37)  ! * Lovox 38)  ! * Claindent 39)  ! * Estratest 40)  ! Celebrex 41)  ! * Mobic 42)  ! Celexa 43)  ! Entex 44)  ! Aciphex 45)  ! Zestril 46)  ! Adalat 47)  ! * Provera 48)  ! Wellbutrin 49)  !  Effexor 50)  ! Paxil 51)  ! * Ketek 52)  ! Clonidine Hcl 53)  ! * Serzone 54)  ! * Protonix 55)  Topamax Sprinkle (Topiramate) 56)  Norvasc (Amlodipine Besylate) 57)  Zoloft (Sertraline Hcl) 58)  Cefaclor (Cefaclor) 59)  * Zithromycin  Past History:  Past Medical History: Anxiety/Depression (?Bipolar) GERD Hypertension      ? TIA 2000    Osteoarthritis IBS Segmental iscemic colitis 8/09       Past Surgical History: Bladder Repair 1995  Cholecystectomy 2004    Hernia Surgery 1995y  Hysterectomy - partial 1992  Right Ankle fracture surgery 1999   Cataract Extraction - bilateral          Family History: No FH of Colon Cancer: Family History of Diabetes: mother, aunts, uncles Family  History of Heart Disease: father, aunt           Social History: Occupation: retired Patient is a former smoker. -stopped 1962 Alcohol Use - no  Illicit Drug Use - no      Daily Caffeine Use  -1 cup daily    Physical Exam  General:  alert, well-developed, and well-nourished.   Lungs:  normal respiratory effort and normal breath sounds.   Heart:  normal rate, regular rhythm, and no gallop.   Extremities:  No lower extremity edema no calf tenderness   Impression & Recommendations:  Problem # 1:  CHEST PAIN, ATYPICAL (ICD-786.59) CT of chest negative for dissection.  some improvement with Maalox.  I doubt cardiac etiology.  Pt to f/u with cardiology.  daily PPI limited by bloating and loose stools.   If persistent symptoms, refer pt back to Dr. Leone Payor for repeat EGD  Complete Medication List: 1)  Spironolactone 25 Mg Tabs (Spironolactone) .... Take 1 tablet by mouth once a day 2)  Diovan 320 Mg Tabs (Valsartan) .... Take 1 tablet by mouth once a day 3)  Premarin 1.25 Mg Tabs (Estrogens conjugated) .... Take 1 tablet by mouth once a day 4)  Librium 5 Mg Caps (Chlordiazepoxide hcl) .... Take 3 tablets by mouth two times a day 5)  Fish Oil 1000 Mg Caps (Omega-3 fatty acids) ....  Take 1 tablet every 6 hours 6)  Citrucel 500 Mg Tabs (Methylcellulose (laxative)) .... Take 1 tablet by mouth once daily 7)  Vitamin D 2000 Unit Tabs (Cholecalciferol) .... Take 1 tablet by mouth once a day 8)  Flexeril 10 Mg Tabs (Cyclobenzaprine hcl) .... Take 2 tablets by mouth once daily as needed 9)  Pepcid Ac 10 Mg Tabs (Famotidine) .... Take as needed 10)  Multivitamins Tabs (Multiple vitamin) .... 2 tablets by mouth once daily 11)  Prilosec 20 Mg Cpdr (Omeprazole) .... Take 1/2 tablet by mouth as needed 12)  Prevalite 4 Gm Pack (Cholestyramine light) .Marland Kitchen.. 1 pack at bedtime may need to hold a dose if constipation occurs 13)  Sucralfate 1 Gm/41ml Susp (Sucralfate) .Marland Kitchen.. 10 ml po once to two times a day  Patient Instructions: 1)  Please schedule a follow-up appointment in 1 month. Prescriptions: SUCRALFATE 1 GM/10ML SUSP (SUCRALFATE) 10 ml po once to two times a day  #1 month x 1   Entered and Authorized by:   D. Thomos Lemons DO   Signed by:   D. Thomos Lemons DO on 01/02/2010   Method used:   Electronically to        [facility]* (retail)       [address] [place], [place]  [address]       Ph: [phone] or [phone]       Fax: [phone]   RxID:   [id number]   Current Allergies (reviewed today): ! * AZITHROMYCIN ! Prudy Feeler ! * LAMICTAL ! DEPAKOTE ! * TRANXENE ! * TRIMETHO/SULAMETHOX ! * CARAFATE ! * KETOROLAC ! * METRONIDAZOLE ! CIPRO ! BENICAR (OLMESARTAN MEDOXOMIL) ! DOXYCYCLINE ! * HYOMAX ! * LOMOTIL ! PERCOCET ! HYDROCHLOROTHIAZIDE ! * BEXTRA ! * ULTRACET ! PREVACID ! ATIVAN ! * RITALIN ! * QUETIAPINE ! * SEROQUEL ! PROZAC ! Isabel Caprice ! KEFLEX ! * HYDRODURIL ! * ABILIFY ! * PRISTIQ ! * REMERON ! IMITREX ! LORTAB ! * VERPAMIL !  RELAFEN ! LASIX ! VIOXX ! * LOVOX ! * CLAINDENT ! * ESTRATEST ! CELEBREX ! * MOBIC ! CELEXA ! ENTEX ! ACIPHEX ! ZESTRIL ! ADALAT ! * PROVERA !  WELLBUTRIN ! [facility] ! PAXIL ! * KETEK ! CLONIDINE HCL ! * SERZONE ! * PROTONIX TOPAMAX SPRINKLE (TOPIRAMATE) NORVASC (AMLODIPINE BESYLATE) ZOLOFT (SERTRALINE HCL) CEFACLOR (CEFACLOR) * ZITHROMYCIN

## 2010-12-26 NOTE — Miscellaneous (Signed)
Summary: Orders Update  Clinical Lists Changes  Orders: Added new Test order of T-Basic Metabolic Panel ([id number]) - Signed Added new Test order of T- Hemoglobin A1C ([id number]) - Signed 

## 2010-12-26 NOTE — Assessment & Plan Note (Signed)
Summary: 5 month follow up/mhf   Vital Signs:  Patient profile:   77 year old female Height:      65.5 inches Weight:      173.25 pounds BMI:     28.49 O2 Sat:      100 % on Room air Temp:     98.0 degrees F oral Pulse rate:   81 / minute Resp:     20 per minute BP sitting:   140 / 60  (right arm) Cuff size:   large  Vitals Entered By: Glendell Docker CMA (October 31, 2010 2:16 PM)  O2 Flow:  Room air CC: follow-up visit Is Patient Diabetic? No Pain Assessment Patient in pain? no        Primary Care Steel Kerney:  Dondra Spry DO  CC:  follow-up visit.  History of Present Illness:  77 y/o white female with hx of bipolar (depression) for follow up pt notes mood has been worse.  librium not helping  she also has persistent chest discomfort after meals.  improves after belching  Preventive Screening-Counseling & Management  Alcohol-Tobacco     Smoking Status: quit  Allergies: 1)  ! * Azithromycin 2)  ! Xanax 3)  ! * Lamictal 4)  ! Depakote 5)  ! * Tranxene 6)  ! * Trimetho/sulamethox 7)  ! * Carafate 8)  ! * Ketorolac 9)  ! * Metronidazole 10)  ! Cipro 11)  ! Benicar (Olmesartan Medoxomil) 12)  ! Doxycycline 13)  ! * Hyomax 14)  ! * Lomotil 15)  ! Percocet 16)  ! Hydrochlorothiazide 17)  ! * Bextra 18)  ! * Ultracet 19)  ! Prevacid 20)  ! Ativan 21)  ! * Ritalin 22)  ! * Quetiapine 23)  ! * Seroquel 24)  ! Prozac 25)  ! * Tivan 26)  ! Keflex 27)  ! * Hydroduril 28)  ! * Abilify 29)  ! * Pristiq 30)  ! * Remeron 31)  ! Imitrex 32)  ! Lortab 33)  ! * Verpamil 34)  ! Relafen 35)  ! Lasix 36)  ! Vioxx 37)  ! * Lovox 38)  ! * Claindent 39)  ! * Estratest 40)  ! Celebrex 41)  ! * Mobic 42)  ! Celexa 43)  ! Entex 44)  ! Aciphex 45)  ! Zestril 46)  ! Adalat 47)  ! * Provera 48)  ! Wellbutrin 49)  ! Effexor 50)  ! Paxil 51)  ! * Ketek 52)  ! Clonidine Hcl 53)  ! * Serzone 54)  ! * Protonix 55)  Topamax Sprinkle (Topiramate) 56)   Norvasc (Amlodipine Besylate) 57)  Zoloft (Sertraline Hcl) 58)  Cefaclor (Cefaclor) 59)  * Zithromycin  Past History:  Past Medical History: Anxiety/Depression (?Bipolar) GERD   Hypertension       HL ? TIA 2000     Osteoarthritis  IBS Segmental iscemic colitis 8/09     Past Surgical History: Bladder Repair 1995  Cholecystectomy 2004    Hernia Surgery 1995 Hysterectomy - partial 1992     Right Ankle fracture surgery 1999   Cataract Extraction - bilateral           Family History: No FH of Colon Cancer: Family History of Diabetes: mother, aunts, uncles Family History of Heart Disease: father, aunt             Father:  Mother:  Siblings:      Social History: Occupation: retired  Patient is a former smoker. -stopped 1962 Alcohol Use - no   Illicit Drug Use - no       Daily Caffeine Use  -1 cup daily      Physical Exam  General:  alert, well-developed, and well-nourished.   Lungs:  normal respiratory effort and normal breath sounds.   Heart:  normal rate, regular rhythm, no murmur, and no gallop.   Extremities:  No lower extremity edema Neurologic:  cranial nerves II-XII intact and gait normal.     Impression & Recommendations:  Problem # 1:  DEPRESSION (ICD-311) Assessment Deteriorated increase in irritability.  also still feels anxious despite taking librium.  trial of lorazepam  The following medications were removed from the medication list:    Librium 5 Mg Caps (Chlordiazepoxide hcl) .Marland Kitchen... Take 1 tablet once to three times per day as needed Her updated medication list for this problem includes:    Amitriptyline Hcl 25 Mg Tabs (Amitriptyline hcl) .Marland Kitchen... 1/2 to one tab by mouth at bedtime as needed    Lorazepam 1 Mg Tabs (Lorazepam) .Marland Kitchen... 1/2 to one tab by mouth three times a day as needed  Problem # 2:  CHEST PAIN, ATYPICAL (ICD-786.59) prev myoview negative  unfortunately pt notes side effects with PPI consider restart ranitidine  01/2010 Exercise  Capacity: Lexiscan stress BP Response: Normal blood pressure response. Clinical Symptoms: No chest pain ECG Impression: No significant ST segment change suggestive of ischemia. Overall Impression: Normal stress nuclear study.  Complete Medication List: 1)  Spironolactone 25 Mg Tabs (Spironolactone) .... Take 1 tablet by mouth once a day 2)  Diovan 320 Mg Tabs (Valsartan) .... Take 1 tablet by mouth once a day 3)  Fish Oil 1000 Mg Caps (Omega-3 fatty acids) .... Take 1 tablet every 6 hours 4)  Citrucel 500 Mg Tabs (Methylcellulose (laxative)) .... Take 1 tablet by mouth once daily 5)  Vitamin D 2000 Unit Tabs (Cholecalciferol) .... Take 1 tablet by mouth once a day 6)  Prevalite 4 Gm Pack (Cholestyramine light) .Marland Kitchen.. 1 pack at bedtime may need to hold a dose if constipation occurs 7)  Maalox Regular Strength 200-200-20 Mg/34ml Susp (Alum & mag hydroxide-simeth) .... As needed 8)  Amitriptyline Hcl 25 Mg Tabs (Amitriptyline hcl) .... 1/2 to one tab by mouth at bedtime as needed 9)  Cyclobenzaprine Hcl 5 Mg Tabs (Cyclobenzaprine hcl) .... One by mouth at bedtime as needed 10)  Prometrium 100 Mg Caps (Progesterone micronized) .... Take 1 capsule by mouth once a day 11)  Lorazepam 1 Mg Tabs (Lorazepam) .... 1/2 to one tab by mouth three times a day as needed  Patient Instructions: 1)  Please schedule a follow-up appointment in 2 months. Prescriptions: SPIRONOLACTONE 25 MG  TABS (SPIRONOLACTONE) Take 1 tablet by mouth once a day  #90 x 1   Entered and Authorized by:   D. Thomos Lemons DO   Signed by:   D. Thomos Lemons DO on 10/31/2010   Method used:   Print then Give to Patient   RxID:   [id number] DIOVAN 320 MG  TABS (VALSARTAN) Take 1 tablet by mouth once a day  #90 x 1   Entered and Authorized by:   D. Thomos Lemons DO   Signed by:   D. Thomos Lemons DO on 10/31/2010   Method used:   Print then Give to Patient   RxID:   [id number] LORAZEPAM 1 MG TABS (LORAZEPAM) 1/2 to one tab by mouth  three times a day  as needed  #90 x 1   Entered and Authorized by:   D. Thomos Lemons DO   Signed by:   D. Thomos Lemons DO on 10/31/2010   Method used:   Print then Give to Patient   RxID:   [id number]    Orders Added: 1)  Est. Patient Level III [32440]    Current Allergies (reviewed today): ! * AZITHROMYCIN ! Prudy Feeler ! * LAMICTAL ! DEPAKOTE ! * TRANXENE ! * TRIMETHO/SULAMETHOX ! * CARAFATE ! * KETOROLAC ! * METRONIDAZOLE ! CIPRO ! BENICAR (OLMESARTAN MEDOXOMIL) ! DOXYCYCLINE ! * HYOMAX ! * LOMOTIL ! PERCOCET ! HYDROCHLOROTHIAZIDE ! * BEXTRA ! * ULTRACET ! PREVACID ! ATIVAN ! * RITALIN ! * QUETIAPINE ! * SEROQUEL ! PROZAC ! Isabel Caprice ! KEFLEX ! * HYDRODURIL ! * ABILIFY ! * PRISTIQ ! * REMERON ! IMITREX ! LORTAB ! * VERPAMIL ! RELAFEN ! LASIX ! VIOXX ! * LOVOX ! * CLAINDENT ! * ESTRATEST ! CELEBREX ! * MOBIC ! CELEXA ! ENTEX ! ACIPHEX ! ZESTRIL ! ADALAT ! * PROVERA ! WELLBUTRIN ! [facility] ! PAXIL ! * KETEK ! CLONIDINE HCL ! * SERZONE ! * PROTONIX TOPAMAX SPRINKLE (TOPIRAMATE) NORVASC (AMLODIPINE BESYLATE) ZOLOFT (SERTRALINE HCL) CEFACLOR (CEFACLOR) * ZITHROMYCIN

## 2010-12-26 NOTE — Assessment & Plan Note (Signed)
Summary: Pt having pain in shoulder and chest at night /pt did call he...   Vital Signs:  Patient profile:   77 year old female O2 Sat:      98 % on Room air Temp:     97.7 degrees F oral Pulse rate:   70 / minute Pulse rhythm:   irregular Resp:     18 per minute BP sitting:   120 / 72  (right arm) Cuff size:   large  Vitals Entered By: Glendell Docker CMA (December 27, 2009 11:34 AM)  O2 Flow:  Room air  Primary Care Provider:  Thomos Lemons, MD  CC:  Chest Pain.  History of Present Illness: Phone note:    Pt had surgery on her foot 11/20/09 and has had several problem since then. 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 She now is having all over body aching that seems to happen primarily at night. During this episode her middle chest is "aching," she has jaw pain, arm pain, leg pain, hand pain and pain between her shoulder blades. It last about 5 min then goes away  she describes pain as severe,  aching and hurting.  symptoms last 5-10 mins.  no shortness of breath.  pt has been taking gas ex.   symptoms only occur at night.  1 AM, 3 AM, 5-6 AM.   no change in symptoms with movement of change in movement. no change with ASA or excedrin. severity 10/10 pan unclear if radiation  but has pain going down the arms and legs.  also pain radiates to middle of back arm and leg 5/10 - aching  no daytime symptoms no exertional symptoms  Allergies: 1)  ! * Azithromycin 2)  ! Xanax 3)  ! * Lamictal 4)  ! Depakote 5)  ! * Tranxene 6)  ! * Trimetho/sulamethox 7)  ! * Carafate 8)  ! * Ketorolac 9)  ! * Metronidazole 10)  ! Cipro 11)  ! Benicar (Olmesartan Medoxomil) 12)  ! Doxycycline 13)  ! * Hyomax 14)  ! * Lomotil 15)  ! Percocet 16)  ! Hydrochlorothiazide 17)  ! * Bextra 18)  ! * Ultracet 19)  ! Prevacid 20)  ! Ativan 21)  ! * Ritalin 22)  ! * Quetiapine 23)  ! * Seroquel 24)  ! Prozac 25)  ! * Tivan 26)  ! Keflex 27)  ! * Hydroduril 28)  ! *  Abilify 29)  ! * Pristiq 30)  ! * Remeron 31)  ! Imitrex 32)  ! Lortab 33)  ! * Verpamil 34)  ! Relafen 35)  ! Lasix 36)  ! Vioxx 37)  ! * Lovox 38)  ! * Claindent 39)  ! * Estratest 40)  ! Celebrex 41)  ! * Mobic 42)  ! Celexa 43)  ! Entex 44)  ! Aciphex 45)  ! Zestril 46)  ! Adalat 47)  ! * Provera 48)  ! Wellbutrin 49)  ! Effexor 50)  ! Paxil 51)  ! * Ketek 52)  ! Clonidine Hcl 53)  ! * Serzone 54)  ! * Protonix 55)  Topamax Sprinkle (Topiramate) 56)  Norvasc (Amlodipine Besylate) 57)  Zoloft (Sertraline Hcl) 58)  Cefaclor (Cefaclor) 59)  * Zithromycin  Past History:  Past Medical History: Anxiety/Depression (?Bipolar) GERD Hypertension    ? TIA 2000    Osteoarthritis IBS Segmental iscemic colitis 8/09       Past  Surgical History: Bladder Repair 1995  Cholecystectomy 2004    Hernia Surgery 1995y Hysterectomy - partial 1992  Right Ankle fracture surgery 1999  Cataract Extraction - bilateral          Family History: No FH of Colon Cancer: Family History of Diabetes: mother, aunts, uncles Family History of Heart Disease: father, aunt         Social History: Occupation: retired Patient is a former smoker. -stopped 1962 Alcohol Use - no  Illicit Drug Use - no     Daily Caffeine Use  -1 cup daily   Review of Systems  The patient denies fever, dyspnea on exertion, peripheral edema, and prolonged cough.    Physical Exam  General:  alert, well-developed, and well-nourished.   Neck:  supple and no masses.   Chest Wall:  no chest wall pain Lungs:  normal respiratory effort and normal breath sounds.   Heart:  normal rate, regular rhythm, and no gallop.   Abdomen:  soft, non-tender, normal bowel sounds, and no masses.   Extremities:  No lower extremity edema no calf tenderness Neurologic:  cranial nerves II-XII intact, strength normal in all extremities, gait normal, and DTRs symmetrical and normal.   Psych:  normally interactive, good eye  contact, not anxious appearing, and not depressed appearing.     Impression & Recommendations:  Problem # 1:  CHEST PAIN, ATYPICAL (ICD-786.59) 77 y/o with noctunal atypical chest pain.  EKG is stabe - NSR at 72 bpm.  Right bundle branch block.  poor r wave progression.   I suspect her symptoms are GI related.  she is only able to take 1/2 of prilosec due to complaints of diarrhea when she takes higher dose.  Strict antireflux measures advised.    pt drinks coffee in AM.  tea with dinner and eats fatty foods at bedtime. CXR today.  Patient advised to call office if symptoms persist or worsen.  Orders: T-2 View CXR, Same Day ([id number]) T-Basic Metabolic Panel [phone]) T-CBC w/Diff [phone])  Complete Medication List: 1)  Spironolactone 25 Mg Tabs (Spironolactone) .... Take 1 tablet by mouth once a day 2)  Diovan 320 Mg Tabs (Valsartan) .... Take 1 tablet by mouth once a day 3)  Premarin 1.25 Mg Tabs (Estrogens conjugated) .... Take 1 tablet by mouth once a day 4)  Librium 5 Mg Caps (Chlordiazepoxide hcl) .... Take 3 tablets by mouth two times a day 5)  Fish Oil 1000 Mg Caps (Omega-3 fatty acids) .... Take 1 tablet every 6 hours 6)  Citrucel 500 Mg Tabs (Methylcellulose (laxative)) .... Take 1 tablet by mouth once daily 7)  Vitamin D 2000 Unit Tabs (Cholecalciferol) .... Take 1 tablet by mouth once a day 8)  Flexeril 10 Mg Tabs (Cyclobenzaprine hcl) .... Take 2 tablets by mouth once daily as needed 9)  Pepcid Ac 10 Mg Tabs (Famotidine) .... Take as needed 10)  Multivitamins Tabs (Multiple vitamin) .... 2 tablets by mouth once daily 11)  Prilosec 20 Mg Cpdr (Omeprazole) .... Take 1/2 tablet by mouth as needed 12)  Prevalite 4 Gm Pack (Cholestyramine light) .Marland Kitchen.. 1 pack at bedtime may need to hold a dose if constipation occurs  Patient Instructions: 1)  Strictly follow anti reflux measures.  2)  Use maalox at bedtime 3)  Call our office if your symptoms do not  improve or gets  worse. 4)  Please schedule a follow-up appointment in 1 week.  Current Allergies (reviewed today): ! * AZITHROMYCIN ! Prudy Feeler ! *  LAMICTAL ! DEPAKOTE ! * TRANXENE ! * TRIMETHO/SULAMETHOX ! * CARAFATE ! * KETOROLAC ! * METRONIDAZOLE ! CIPRO ! BENICAR (OLMESARTAN MEDOXOMIL) ! DOXYCYCLINE ! * HYOMAX ! * LOMOTIL ! PERCOCET ! HYDROCHLOROTHIAZIDE ! * BEXTRA ! * ULTRACET ! PREVACID ! ATIVAN ! * RITALIN ! * QUETIAPINE ! * SEROQUEL ! PROZAC ! Isabel Caprice ! KEFLEX ! * HYDRODURIL ! * ABILIFY ! * PRISTIQ ! * REMERON ! IMITREX ! LORTAB ! * VERPAMIL ! RELAFEN ! LASIX ! VIOXX ! * LOVOX ! * CLAINDENT ! * ESTRATEST ! CELEBREX ! * MOBIC ! CELEXA ! ENTEX ! ACIPHEX ! ZESTRIL ! ADALAT ! * PROVERA ! WELLBUTRIN ! [facility] ! PAXIL ! * KETEK ! CLONIDINE HCL ! * SERZONE ! * PROTONIX TOPAMAX SPRINKLE (TOPIRAMATE) NORVASC (AMLODIPINE BESYLATE) ZOLOFT (SERTRALINE HCL) CEFACLOR (CEFACLOR) * ZITHROMYCIN

## 2010-12-26 NOTE — Progress Notes (Signed)
Summary: Initial Lab & Annual Implant Therapy Sheet Brought by Patient/HR  Initial Lab & Annual Implant Therapy Sheet Brought by Patient/[facility]   Imported By: Lanelle Bal 08/12/2010 14:32:42  _____________________________________________________________________  External Attachment:    Type:   Image     Comment:   External Document

## 2010-12-26 NOTE — Progress Notes (Signed)
Summary: refill--Librium  Phone Note Refill Request Message from:  Fax from Pharmacy on August 09, 2010 9:24 AM  Refills Requested: Medication #1:  chlordiazepoxide hcl 5 mg cap   Dosage confirmed as above?Dosage Confirmed   Brand Name Necessary? No   Supply Requested: 3 months   Last Refilled: 05/28/2010  Method Requested: Electronic Next Appointment Scheduled: 08-15-10 215 Dr Artist Pais  Initial call taken by: Roselle Locus,  August 09, 2010 9:26 AM  Follow-up for Phone Call        ok to refill x 3 Follow-up by: D. Thomos Lemons DO,  August 09, 2010 5:25 PM    New/Updated Medications: LIBRIUM 5 MG  CAPS (CHLORDIAZEPOXIDE HCL) Take 1 tablet by mouth once a day. Prescriptions: LIBRIUM 5 MG  CAPS (CHLORDIAZEPOXIDE HCL) Take 1 tablet by mouth once a day.  #30 x 2   Entered by:   Mervin Kung CMA (AAMA)   Authorized by:   D. Thomos Lemons DO   Signed by:   Mervin Kung CMA (AAMA) on 08/09/2010   Method used:   Electronically to        [facility]* (retail)       [address] [place], [place]  [address]       Ph: [phone] or [phone]       Fax: [phone]   RxID:   [id number]

## 2010-12-26 NOTE — Assessment & Plan Note (Signed)
Summary: Cardiology Nuclear Study  Nuclear Med Background Indications for Stress Test: Evaluation for Ischemia   History: Echo, Myocardial Perfusion Study  History Comments: '01 MPS:no ischemia, EF=85%; '09 Echo:EF=55-65%  Symptoms: Chest Pain, Diaphoresis, Dizziness, Fatigue  Symptoms Comments: Last episode of CP:4 days ago.  "Chest pain started after foot surgery", 11/20/09, per pt.   Nuclear Pre-Procedure Cardiac Risk Factors: History of Smoking, Hypertension, Lipids, RBBB, TIA Caffeine/Decaff Intake: None NPO After: 6:00 PM Lungs: Clear.  O2 Sat 98% on RA. IV 0.9% NS with Angio Cath: 22g     IV Site: (R) Hand IV Started by: Irean Hong RN Chest Size (in) 38     Cup Size B     Height (in): 65.5 Weight (lb): 182 BMI: 29.93 Tech Comments: h/o TIA per patient.  Tylenol given to patient after lexiscan for headache.  Nuclear Med Study 1 or 2 day study:  1 day     Stress Test Type:  Eugenie Birks Reading MD:  Dietrich Pates, MD     Referring MD:  J.Allred Resting Radionuclide:  Technetium 10m Tetrofosmin     Resting Radionuclide Dose:  11.0 mCi  Stress Radionuclide:  Technetium 26m Tetrofosmin     Stress Radionuclide Dose:  32.0 mCi   Stress Protocol   Lexiscan: 0.4 mg   Stress Test Technologist:  Rea [employer] CMA-N     Nuclear Technologist:  Harlow Asa CNMT  Rest Procedure  Myocardial perfusion imaging was performed at rest 45 minutes following the intravenous administration of Myoview Technetium 59m Tetrofosmin.  Stress Procedure  The patient received IV Lexiscan 0.4 mg over 15-seconds.  Myoview injected at 30-seconds.  There were no significant changes with lexiscan.  Quantitative spect images were obtained after a 45 minute delay.  QPS Raw Data Images:  Normal; no motion artifact; normal heart/lung ratio. Stress Images:  There is normal uptake in all areas. Rest Images:  Normal homogeneous uptake in all areas of the myocardium. Subtraction (SDS):  No evidence of  ischemia. Transient Ischemic Dilatation:  1.13  (Normal <1.22)  Lung/Heart Ratio:  .30  (Normal <0.45)  Quantitative Gated Spect Images QGS EDV:  66 ml QGS ESV:  12 ml QGS EF:  82 %   Overall Impression  Exercise Capacity: Lexiscan stress BP Response: Normal blood pressure response. Clinical Symptoms: No chest pain ECG Impression: No significant ST segment change suggestive of ischemia. Overall Impression: Normal stress nuclear study.  Appended Document: Cardiology Nuclear Study Normal stress test.  No further cardiac testing at this time. Pt should follow-up with Dr Artist Pais to consider GI evaluation.

## 2010-12-26 NOTE — Progress Notes (Signed)
Summary: Cancelling Nuclear study  Phone Note Call from Patient   [name]: Patient Call For:  Dr. Jenel Lucks office Summary of Call: Pt called this AM to cancel Myoview sched for 2/24. Gave no particular reason, just wanted to cancel and did not wish to resched. Initial call taken by: Leonia Corona, RT-N,  January 14, 2010 10:19 AM     Appended Document: Cancelling Nuclear study I will be happy to assist should she change her mind. She will continue to follow with Dr Artist Pais.

## 2010-12-26 NOTE — Progress Notes (Signed)
Summary: Librium Refill  Phone Note Call from Patient   [name]: Patient Details for Reason: Medication refill Summary of Call: Pt needs  medication  Librium  refilled   has only enough for today    Initial call taken by: Darral Dash,  January 22, 2010 12:45 PM  Follow-up for Phone Call        ok to refill x 3 Follow-up by: D. Thomos Lemons DO,  January 22, 2010 9:56 PM  Additional Follow-up for Phone Call Additional follow up Details #1::        Phone Call Completed, rx's faxed to pharmacy, resent due to refill correction Additional Follow-up by: Glendell Docker CMA,  January 23, 2010 9:01 AM    Prescriptions: LIBRIUM 5 MG  CAPS (CHLORDIAZEPOXIDE HCL) Take 3 tablets by mouth two times a day  #180 x 3   Entered by:   Glendell Docker CMA   Authorized by:   D. Thomos Lemons DO   Signed by:   Glendell Docker CMA on 01/23/2010   Method used:   Faxed to ...       [facility]* (retail)       [address] [place], [place]  [address]       Ph: [phone] or [phone]       Fax: [phone]   RxID:   [id number] LIBRIUM 5 MG  CAPS (CHLORDIAZEPOXIDE HCL) Take 3 tablets by mouth two times a day  #180 x e   Entered by:   Glendell Docker CMA   Authorized by:   D. Thomos Lemons DO   Signed by:   Glendell Docker CMA on 01/23/2010   Method used:   Faxed to ...       [facility]* (retail)       [address] [place], [place]  [address]       Ph: [phone] or [phone]       Fax: [phone]   RxID:   [id number]

## 2010-12-26 NOTE — Progress Notes (Signed)
Summary: refill--Amitriptyline  Phone Note Refill Request Message from:  Fax from [facility] on July 01, 2010 5:08 PM  Refills Requested: Medication #1:  Amitriptyline HCL 25mg   Take 1 tablet at bedtime Pt last seen 05/01/10. It looks like this medication has been removed from pt's med list.  Please advise.   Next Appointment Scheduled: Dr Yoo--10/31/10 Initial call taken by: Mervin Kung CMA Duncan Dull),  July 01, 2010 5:09 PM  Follow-up for Phone Call        deny refill.  call pt re:  why pharm is requesting refill Follow-up by: D. Thomos Lemons DO,  July 01, 2010 5:24 PM  Additional Follow-up for Phone Call Additional follow up Details #1::        Pt states she is having trouble sleeping and had some Amitriptyline left over. She has been taking 1/2 tablet when needed at  bedtime and is requesting to go back on  medication. Please advise. Nicki Guadalajara Fergerson CMA Duncan Dull)  July 02, 2010 8:44 AM     Additional Follow-up for Phone Call Additional follow up Details #2::    ok to resume amitriptyline  Follow-up by: D. Thomos Lemons DO,  July 02, 2010 9:03 AM  Additional Follow-up for Phone Call Additional follow up Details #3:: Details for Additional Follow-up Action Taken: Pt notified. Nicki Guadalajara Fergerson CMA Duncan Dull)  July 02, 2010 3:19 PM   New/Updated Medications: AMITRIPTYLINE HCL 25 MG TABS (AMITRIPTYLINE HCL) 1/2 to one tab by mouth at bedtime as needed Prescriptions: AMITRIPTYLINE HCL 25 MG TABS (AMITRIPTYLINE HCL) 1/2 to one tab by mouth at bedtime as needed  #30 x 2   Entered and Authorized by:   D. Thomos Lemons DO   Signed by:   D. Thomos Lemons DO on 07/02/2010   Method used:   Electronically to        [facility]* (retail)       [address] [place], [place]  [address]       Ph: [phone] or [phone]       Fax: [phone]   RxID:   [id number]

## 2010-12-26 NOTE — Progress Notes (Signed)
Summary: REFERRAL   Phone Note Call from Patient Call back at Home Phone [phone]   [name]: Patient Call For: Cynthia Gilmore  Summary of Call: SHE WAS IN YESTERDAY AND TOLD DR Markisha Meding NO ABOUT GOING TO NUTRITIONIST.  SHE HAS CHANGED HER MIND  PLEASE GIVE HER A REFERRAL TO A NUTRITIONIST. Initial call taken by: Roselle Locus,  January 31, 2010 11:56 AM  Follow-up for Phone Call        Dr.Lott Seelbach, Do you want me to schedule this lab to be done now or in 3 months with her lab appointment? Let me know and I will call patient and set up. Thank you Follow-up by: Michaelle Copas,  January 31, 2010 2:08 PM  Additional Follow-up for Phone Call Additional follow up Details #1::        labs before next visit Additional Follow-up by: D. Thomos Lemons DO,  January 31, 2010 4:19 PM  New Problems: DIABETES MELLITUS, TYPE II, BORDERLINE (ICD-790.29)   Additional Follow-up for Phone Call Additional follow up Details #2::    I added on this lab with her June Lab appointment.. Follow-up by: Michaelle Copas,  February 01, 2010 9:02 AM  New Problems: DIABETES MELLITUS, TYPE II, BORDERLINE (ICD-790.29)

## 2010-12-26 NOTE — Progress Notes (Signed)
Summary: Sooner appt.  Phone Note Call from Patient Call back at Home Phone [phone] Call back at [phone]   [name]: Patient Call For: Dr. Leone Payor Reason for Call: Talk to Nurse Summary of Call: Pt is calling back she still has chest pains, arms/leg pain. Pt feels like she cannot wait until 02-11-10 to be seen. Initial call taken by: Karna Christmas,  January 17, 2010 9:47 AM  Follow-up for Phone Call        Patient  with pain in her chest and pain between her shoulder blades.  She was scheduled for a Myoview with Dr Johney Frame but the patient canceled it.  patient says she was cleared by her cardiologist.  I have asked her to reconsiderand reschedule the myoview.  She is offered an appointment wiht Amy Esterwood PA tomorrow to rule out a GI couse, she denies reflux, GERD nausea, vomiting.  She does report she is unable to burp.  Patient  has decided she wants to keep the appointment with Dr Leone Payor for March and that she is going to reschedule her myoview.  I have asked her to call back if she wants the earlier appointment with Mike Gip PA. Follow-up by: Darcey Nora RN, CGRN,  January 17, 2010 10:41 AM

## 2010-12-26 NOTE — Progress Notes (Signed)
Summary: refill--librium  Phone Note Refill Request Message from:  Fax from Pharmacy on January 22, 2010 2:47 PM  Refills Requested: Medication #1:  LIBRIUM 5 MG  CAPS Take 3 tablets by mouth two times a day   Dosage confirmed as above?Dosage Confirmed   Brand Name Necessary? No   Notes: 2 month supply [facility]    Method Requested: Electronic Next Appointment Scheduled: 01-23-10 lab  Initial call taken by: Roselle Locus,  January 22, 2010 2:48 PM  Follow-up for Phone Call        Notified pt. that Librium was refilled. Follow-up by: Mervin Kung CMA,  January 22, 2010 5:43 PM    Prescriptions: LIBRIUM 5 MG  CAPS (CHLORDIAZEPOXIDE HCL) Take 3 tablets by mouth two times a day  #180 x 0   Entered by:   Mervin Kung CMA   Authorized by:   D. Thomos Lemons DO   Signed by:   Mervin Kung CMA on 01/22/2010   Method used:   Electronically to        [facility]* (retail)       [address] [place], [place]  [address]       Ph: [phone] or [phone]       Fax: [phone]   RxID:   [id number]

## 2010-12-26 NOTE — Progress Notes (Signed)
Summary: c/o cp,    Phone Note Call from Patient Call back at Home Phone [phone]   [name]: Patient Reason for Call: Talk to Nurse Complaint: Chest Pain Details for Reason: surgery dec 28 on foot. c/o chestpain, ach all over, including shoulder blade. but goes away after few minutes. pcp was not contacted.  Initial call taken by: Lorne Skeens,  December 26, 2009 8:21 AM  Follow-up for Phone Call        Pt had surgery on her foot 11/20/09 and has had several problem since then. 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 She now is having all over body aching that seems to happen primarily at night. During this episode her middle chest is "aching," she has jaw pain, arm pain, leg pain, hand pain and pain between her shoulder blades. It last about 5 min then goes away. She has been lying down and has been off of her feet since Dec. No cough. Dr. Johney Frame is not in the office today. Pt was advised to see PCP, which she has not called, or if her pain gets worse to go to the ER for further evaluation. Pt understands and agrees with plan.  Follow-up by: Duncan Dull, RN, BSN,  December 26, 2009 8:39 AM     Appended Document: c/o cp,  Pt seen by Dr Artist Pais and felt to have atypical chest pain.  CT scan without significant pathology. If chest pain persists, I would be happy to see her in the office.

## 2010-12-26 NOTE — Assessment & Plan Note (Signed)
Summary: CANT TOLERATE NEW MED/MHF   Vital Signs:  Patient profile:   77 year old female Height:      65.5 inches Weight:      172.75 pounds BMI:     28.41 O2 Sat:      100 % on Room air Temp:     98.3 degrees F oral Pulse rate:   87 / minute Resp:     22 per minute BP sitting:   140 / 80  (right arm) Cuff size:   large  Vitals Entered By: Glendell Docker CMA (November 11, 2010 10:11 AM)  O2 Flow:  Room air CC: Medication Problems Is Patient Diabetic? No Pain Assessment Patient in pain? no      Comments c/o nausea and fatigue with Ativan, patient states that she has taken Benadryl over the weekend for anxiety   Primary Care Provider:  Dondra Spry DO  CC:  Medication Problems.  History of Present Illness: 77 y/o female with depression / anxiety for f/u she could not tolerate lorazepam - it caused fatigue and nausea she stopped over the weekend while she was visiting family she did not have her old rx of librium with her now she is experiencing tremors    Preventive Screening-Counseling & Management  Alcohol-Tobacco     Smoking Status: quit  Allergies: 1)  ! * Azithromycin 2)  ! Xanax 3)  ! * Lamictal 4)  ! Depakote 5)  ! * Tranxene 6)  ! * Trimetho/sulamethox 7)  ! * Carafate 8)  ! * Ketorolac 9)  ! * Metronidazole 10)  ! Cipro 11)  ! Benicar (Olmesartan Medoxomil) 12)  ! Doxycycline 13)  ! * Hyomax 14)  ! * Lomotil 15)  ! Percocet 16)  ! Hydrochlorothiazide 17)  ! * Bextra 18)  ! * Ultracet 19)  ! Prevacid 20)  ! Ativan 21)  ! * Ritalin 22)  ! * Quetiapine 23)  ! * Seroquel 24)  ! Prozac 25)  ! * Tivan 26)  ! Keflex 27)  ! * Hydroduril 28)  ! * Abilify 29)  ! * Pristiq 30)  ! * Remeron 31)  ! Imitrex 32)  ! Lortab 33)  ! * Verpamil 34)  ! Relafen 35)  ! Lasix 36)  ! Vioxx 37)  ! * Lovox 38)  ! * Claindent 39)  ! * Estratest 40)  ! Celebrex 41)  ! * Mobic 42)  ! Celexa 43)  ! Entex 44)  ! Aciphex 45)  ! Zestril 46)  !  Adalat 47)  ! * Provera 48)  ! Wellbutrin 49)  ! Effexor 50)  ! Paxil 51)  ! * Ketek 52)  ! Clonidine Hcl 53)  ! * Serzone 54)  ! * Protonix 55)  Topamax Sprinkle (Topiramate) 56)  Norvasc (Amlodipine Besylate) 57)  Zoloft (Sertraline Hcl) 58)  Cefaclor (Cefaclor) 59)  * Zithromycin  Past History:  Past Medical History: Anxiety/Depression (?Bipolar) GERD   Hypertension      HL ? TIA 2000     Osteoarthritis  IBS Segmental iscemic colitis 8/09     Past Surgical History: Bladder Repair 1995  Cholecystectomy 2004    Hernia Surgery 1995y   Hysterectomy - partial 1992   Right Ankle fracture surgery 1999   Cataract Extraction - bilateral           Physical Exam  General:  alert, well-developed, and well-nourished.   Lungs:  normal respiratory effort and normal  breath sounds.  normal respiratory effort and normal breath sounds.   Heart:  normal rate, regular rhythm, and no gallop.   Neurologic:  cranial nerves II-XII intact.  resting hand tremor   Impression & Recommendations:  Problem # 1:  DEPRESSION/ANXIETY (ICD-300.4) pt tried to switch from librium to lorazepam she could not tolerate lorazepam due to nausea and somnolence pt experiencing tremors assoc with withdrawal symptoms BZD restart librium at 10 mg three times a day  retry mood stabilizer - lamictal at low doses ( after to holidays )  Problem # 2:  GERD (ICD-530.81) Assessment: Unchanged PPIs exacerbates IBS symptoms.   use ranitidine as needed  Her updated medication list for this problem includes:    Maalox Regular Strength 200-200-20 Mg/41ml Susp (Alum & mag hydroxide-simeth) .Marland Kitchen... As needed    Ranitidine Hcl 150 Mg Tabs (Ranitidine hcl) ..... One by mouth two times a day as needed  Complete Medication List: 1)  Spironolactone 25 Mg Tabs (Spironolactone) .... Take 1 tablet by mouth once a day 2)  Diovan 320 Mg Tabs (Valsartan) .... Take 1 tablet by mouth once a day 3)  Fish Oil 1000 Mg Caps  (Omega-3 fatty acids) .... Take 1 tablet every 6 hours 4)  Citrucel 500 Mg Tabs (Methylcellulose (laxative)) .... Take 1 tablet by mouth once daily 5)  Vitamin D 2000 Unit Tabs (Cholecalciferol) .... Take 1 tablet by mouth once a day 6)  Prevalite 4 Gm Pack (Cholestyramine light) .Marland Kitchen.. 1 pack at bedtime may need to hold a dose if constipation occurs 7)  Maalox Regular Strength 200-200-20 Mg/63ml Susp (Alum & mag hydroxide-simeth) .... As needed 8)  Amitriptyline Hcl 25 Mg Tabs (Amitriptyline hcl) .... 1/2 to one tab by mouth at bedtime as needed 9)  Cyclobenzaprine Hcl 5 Mg Tabs (Cyclobenzaprine hcl) .... One by mouth at bedtime as needed 10)  Prometrium 100 Mg Caps (Progesterone micronized) .... Take 1 capsule by mouth once a day 11)  Chlordiazepoxide Hcl 10 Mg Caps (Chlordiazepoxide hcl) .... One by mouth three times a day 12)  Lamotrigine 25 Mg Tabs (Lamotrigine) .... One by mouth at bedtime x 7 days, then one by mouth two times a day 13)  Ranitidine Hcl 150 Mg Tabs (Ranitidine hcl) .... One by mouth two times a day as needed  Patient Instructions: 1)  Please schedule a follow-up appointment in 2 months. Prescriptions: RANITIDINE HCL 150 MG TABS (RANITIDINE HCL) one by mouth two times a day as needed  #60 x 5   Entered and Authorized by:   D. Thomos Lemons DO   Signed by:   D. Thomos Lemons DO on 11/11/2010   Method used:   Print then Give to Patient   RxID:   [id number] LAMOTRIGINE 25 MG TABS (LAMOTRIGINE) one by mouth at bedtime x 7 days, then one by mouth two times a day  #60 x 2   Entered and Authorized by:   D. Thomos Lemons DO   Signed by:   D. Thomos Lemons DO on 11/11/2010   Method used:   Print then Give to Patient   RxID:   [id number] CHLORDIAZEPOXIDE HCL 10 MG CAPS (CHLORDIAZEPOXIDE HCL) one by mouth three times a day  #90 x 2   Entered and Authorized by:   D. Thomos Lemons DO   Signed by:   D. Thomos Lemons DO on 11/11/2010   Method used:   Print then Give to Patient   RxID:    [id number]  Orders Added: 1)  Est. Patient Level III [16109]    Current Allergies (reviewed today): ! * AZITHROMYCIN ! XANAX ! * LAMICTAL ! DEPAKOTE ! * TRANXENE ! * TRIMETHO/SULAMETHOX ! * CARAFATE ! * KETOROLAC ! * METRONIDAZOLE ! CIPRO ! BENICAR (OLMESARTAN MEDOXOMIL) ! DOXYCYCLINE ! * HYOMAX ! * LOMOTIL ! PERCOCET ! HYDROCHLOROTHIAZIDE ! * BEXTRA ! * ULTRACET ! PREVACID ! ATIVAN ! * RITALIN ! * QUETIAPINE ! * SEROQUEL ! PROZAC ! Isabel Caprice ! KEFLEX ! * HYDRODURIL ! * ABILIFY ! * PRISTIQ ! * REMERON ! IMITREX ! LORTAB ! * VERPAMIL ! RELAFEN ! LASIX ! VIOXX ! * LOVOX ! * CLAINDENT ! * ESTRATEST ! CELEBREX ! * MOBIC ! CELEXA ! ENTEX ! ACIPHEX ! ZESTRIL ! ADALAT ! * PROVERA ! WELLBUTRIN ! [facility] ! PAXIL ! * KETEK ! CLONIDINE HCL ! * SERZONE ! * PROTONIX TOPAMAX SPRINKLE (TOPIRAMATE) NORVASC (AMLODIPINE BESYLATE) ZOLOFT (SERTRALINE HCL) CEFACLOR (CEFACLOR) * ZITHROMYCIN

## 2010-12-26 NOTE — Progress Notes (Signed)
Summary: is pulse too high   Phone Note Call from Patient Call back at [phone]   [name]: Patient Call For: Cynthia Gilmore  Summary of Call: her bp last night was 127/68 with a pulse of 105 the next time 120/70 104 pulse  108/76  109 pulse  She wants to know is the pulse rate too high.   Initial call taken by: Roselle Locus,  December 03, 2010 8:22 AM  Follow-up for Phone Call        yes, pulse rate is somewhat high.   If persistently >100, I suggest OV Follow-up by: D. Thomos Lemons DO,  December 03, 2010 10:59 AM  Additional Follow-up for Phone Call Additional follow up Details #1::        call was returned to patient at [phone], she has been advised per Dr Artist Pais instructions. She has scheduled office visit for Thursday @ 9am with Dr Artist Pais Additional Follow-up by: Glendell Docker CMA,  December 03, 2010 11:31 AM

## 2010-12-26 NOTE — Progress Notes (Signed)
Summary: Nuclear Pre-Procedure  Phone Note Outgoing Call   Call placed by: Milana Na, EMT-P,  January 23, 2010 12:59 PM Summary of Call: Left message with information on [employer] Information Sheet (see scanned document for details).      Nuclear Med Background Indications for Stress Test: Evaluation for Ischemia   History: Echo, Myocardial Perfusion Study   Symptoms: Chest Pain, Dizziness, Rapid HR    Nuclear Pre-Procedure Cardiac Risk Factors: History of Smoking, Hypertension, Lipids Height (in): 65.5  Nuclear Med Study Referring MD:  J.Allred

## 2010-12-26 NOTE — Progress Notes (Signed)
Summary: Sooner Appt.  Phone Note Call from Patient Call back at [phone]   [name]: Patient Call For: Dr. Leone Payor Reason for Call: Talk to Nurse Summary of Call: Pt is having chest pain, pain in between shoulder blades and arm/leg pain. Cardiologist ruled out her heart. First avail is not until 02-13-10 and she wants a sooner appt. Initial call taken by: Karna Christmas,  January 16, 2010 10:41 AM  Follow-up for Phone Call        Patient  has had several week hx of chest, back, neck, and leg pain.  She says allher symptoms started after she had foot surgery in December.  Patient  is taking carafate and maalox and that helps periodicaly.  Patient  is scheduled to see Dr Leone Payor 02-11-10 2:00.  I will put the patient on the cancelation list Follow-up by: Darcey Nora RN, CGRN,  January 16, 2010 11:37 AM

## 2010-12-26 NOTE — Assessment & Plan Note (Signed)
Summary: 3 month follow up/mhf   Vital Signs:  Patient profile:   77 year old female Weight:      176.25 pounds BMI:     28.99 O2 Sat:      98 % on Room air Temp:     98.2 degrees F oral Pulse rate:   66 / minute Pulse rhythm:   regular Resp:     16 per minute BP sitting:   134 / 70  (right arm) Cuff size:   large  Vitals Entered By: Glendell Docker CMA (May 01, 2010 10:57 AM)  O2 Flow:  Room air CC: Rm 3- 3 Month Follow up Is Patient Diabetic? No   Primary Care Provider:  DThomos Lemons DO  CC:  Rm 3- 3 Month Follow up.  History of Present Illness: 77 y/o female c/o sore throat , headaches  onset Monday, non productive cough, Mucinex with improvement  symptoms are worse at night   Preventive Screening-Counseling & Management  Alcohol-Tobacco     Smoking Status: quit  Allergies: 1)  ! * Azithromycin 2)  ! Xanax 3)  ! * Lamictal 4)  ! Depakote 5)  ! * Tranxene 6)  ! * Trimetho/sulamethox 7)  ! * Carafate 8)  ! * Ketorolac 9)  ! * Metronidazole 10)  ! Cipro 11)  ! Benicar (Olmesartan Medoxomil) 12)  ! Doxycycline 13)  ! * Hyomax 14)  ! * Lomotil 15)  ! Percocet 16)  ! Hydrochlorothiazide 17)  ! * Bextra 18)  ! * Ultracet 19)  ! Prevacid 20)  ! Ativan 21)  ! * Ritalin 22)  ! * Quetiapine 23)  ! * Seroquel 24)  ! Prozac 25)  ! * Tivan 26)  ! Keflex 27)  ! * Hydroduril 28)  ! * Abilify 29)  ! * Pristiq 30)  ! * Remeron 31)  ! Imitrex 32)  ! Lortab 33)  ! * Verpamil 34)  ! Relafen 35)  ! Lasix 36)  ! Vioxx 37)  ! * Lovox 38)  ! * Claindent 39)  ! * Estratest 40)  ! Celebrex 41)  ! * Mobic 42)  ! Celexa 43)  ! Entex 44)  ! Aciphex 45)  ! Zestril 46)  ! Adalat 47)  ! * Provera 48)  ! Wellbutrin 49)  ! Effexor 50)  ! Paxil 51)  ! * Ketek 52)  ! Clonidine Hcl 53)  ! * Serzone 54)  ! * Protonix 55)  Topamax Sprinkle (Topiramate) 56)  Norvasc (Amlodipine Besylate) 57)  Zoloft (Sertraline Hcl) 58)  Cefaclor (Cefaclor) 59)  *  Zithromycin  Family History: No FH of Colon Cancer: Family History of Diabetes: mother, aunts, uncles Family History of Heart Disease: father, aunt             Social History: Occupation: retired Patient is a former smoker. -stopped 29 Alcohol Use - no  Illicit Drug Use - no      Daily Caffeine Use  -1 cup daily      Physical Exam  General:  alert, well-developed, and well-nourished.   Ears:  R ear normal and L ear normal.   Mouth:  pharyngeal erythema.   Neck:  supple and no masses.   Lungs:  normal respiratory effort, normal breath sounds, no crackles, and no wheezes.   Heart:  normal rate, regular rhythm, and no gallop.     Impression & Recommendations:  Problem # 1:  RHINOSINUSITIS, ACUTE (ICD-461.8)  Probable allergic vs viral cause.  discussed symptomatic tx.  Patient advised to call office if symptoms persist or worsen.  Complete Medication List: 1)  Spironolactone 25 Mg Tabs (Spironolactone) .... Take 1 tablet by mouth once a day 2)  Diovan 320 Mg Tabs (Valsartan) .... Take 1 tablet by mouth once a day 3)  Premarin 1.25 Mg Tabs (Estrogens conjugated) .... Take 1 tablet by mouth once a day 4)  Librium 5 Mg Caps (Chlordiazepoxide hcl) .... Take 3 tablets by mouth two times a day 5)  Fish Oil 1000 Mg Caps (Omega-3 fatty acids) .... Take 1 tablet every 6 hours 6)  Citrucel 500 Mg Tabs (Methylcellulose (laxative)) .... Take 1 tablet by mouth once daily 7)  Vitamin D 2000 Unit Tabs (Cholecalciferol) .... Take 1 tablet by mouth once a day 8)  Multivitamins Tabs (Multiple vitamin) .... 2 tablets by mouth once daily 9)  Prilosec 20 Mg Cpdr (Omeprazole) .... Take 1/2 tablet by mouth as needed 10)  Prevalite 4 Gm Pack (Cholestyramine light) .Marland Kitchen.. 1 pack at bedtime may need to hold a dose if constipation occurs 11)  Maalox Regular Strength 200-200-20 Mg/29ml Susp (Alum & mag hydroxide-simeth) .... As needed 12)  Cefuroxime Axetil 500 Mg Tabs (Cefuroxime axetil) .... One by mouth  bid 13)  Prednisone 20 Mg Tabs (Prednisone) .... One by mouth two times a day x 4 days, 1/2 by mouth two times a day x 4 days, then 1/2 by mouth once daily x 4 days  Patient Instructions: 1)  Use neil med sinus rinse over the counter 2)  Please schedule a follow-up appointment in 6 months. 3)  BMP prior to visit, ICD-9: 790.29 4)  HbgA1C prior to visit, ICD-9:  790.29 5)  Please return for lab work one (1) week before your next appointment.  Prescriptions: SPIRONOLACTONE 25 MG  TABS (SPIRONOLACTONE) Take 1 tablet by mouth once a day  #30 x 5   Entered and Authorized by:   D. Thomos Lemons DO   Signed by:   D. Thomos Lemons DO on 05/01/2010   Method used:   Electronically to        [facility]* (retail)       [address] [place], [place]  [address]       Ph: [phone] or [phone]       Fax: [phone]   RxID:   [id number] DIOVAN 320 MG  TABS (VALSARTAN) Take 1 tablet by mouth once a day  #30 x 5   Entered and Authorized by:   D. Thomos Lemons DO   Signed by:   D. Thomos Lemons DO on 05/01/2010   Method used:   Electronically to        [facility]* (retail)       [address] [place], [place]  [address]       Ph: [phone] or [phone]       Fax: [phone]   RxID:   [id number]    Preventive Care Screening  Mammogram:    Date:  04/02/2010    Results:  normal    Current Allergies (reviewed today): ! * AZITHROMYCIN ! Prudy Feeler ! * LAMICTAL ! DEPAKOTE ! * TRANXENE ! * TRIMETHO/SULAMETHOX ! * CARAFATE ! * KETOROLAC ! *  METRONIDAZOLE ! CIPRO ! BENICAR (OLMESARTAN MEDOXOMIL) ! DOXYCYCLINE ! * HYOMAX ! * LOMOTIL ! PERCOCET ! HYDROCHLOROTHIAZIDE ! * BEXTRA ! * ULTRACET ! PREVACID ! ATIVAN ! * RITALIN ! * QUETIAPINE ! * SEROQUEL ! PROZAC ! Isabel Caprice ! KEFLEX ! * HYDRODURIL ! * ABILIFY ! * PRISTIQ ! * REMERON ! IMITREX !  LORTAB ! * VERPAMIL ! RELAFEN ! LASIX ! VIOXX ! * LOVOX ! * CLAINDENT ! * ESTRATEST ! CELEBREX ! * MOBIC ! CELEXA ! ENTEX ! ACIPHEX ! ZESTRIL ! ADALAT ! * PROVERA ! WELLBUTRIN ! [facility] ! PAXIL ! * KETEK ! CLONIDINE HCL ! * SERZONE ! * PROTONIX TOPAMAX SPRINKLE (TOPIRAMATE) NORVASC (AMLODIPINE BESYLATE) ZOLOFT (SERTRALINE HCL) CEFACLOR (CEFACLOR) * ZITHROMYCIN

## 2010-12-26 NOTE — Progress Notes (Signed)
Summary: lab results  Phone Note Outgoing Call   Summary of Call: call pt - allergy panel normal.  sed rate (marker of inflammation) - normal electrolytes and kidney function is normal Initial call taken by: D. Thomos Lemons DO,  August 01, 2010 11:33 AM  Follow-up for Phone Call        Left detailed message on machine of normal results. Cynthia Gilmore CMA (AAMA)  August 01, 2010 1:10 PM

## 2010-12-26 NOTE — Assessment & Plan Note (Signed)
Summary: CPX/MHF   Vital Signs:  Patient profile:   77 year old female Weight:      185 pounds BMI:     30.43 O2 Sat:      97 % on Room air Temp:     97.9 degrees F oral Pulse rate:   70 / minute Pulse rhythm:   regular Resp:     18 per minute BP sitting:   110 / 72  (right arm) Cuff size:   97large  Vitals Entered By: Glendell Docker CMA (January 30, 2010 9:29 AM)  O2 Flow:  Room air CC: Rm 2- CPX Comments s/p right  foot surgery, 12/28, evaluated by cardiology lastweek, refill on Librium     Last PAP Result Declined Hysterectomy   Contraindications/Deferment of Procedures/Staging:    Test/Procedure: PAP Smear    Reason for deferment: hysterectomy   Primary Care Provider:  Dondra Spry DO  CC:  Rm 2- CPX.  History of Present Illness:  77 y/o white female with pmhx of:      for routine cpx  Anxiety/Depression (?Bipolar) GERD Hypertension      IBS for routine CPX.   Recent stress test reviewed social hx reviewed blood tests results reviewed  no hx of falls hx of depression /anxiety - unable to tolerate any meds.  she has not started exercise program  Allergies: 1)  ! * Azithromycin 2)  ! Xanax 3)  ! * Lamictal 4)  ! Depakote 5)  ! * Tranxene 6)  ! * Trimetho/sulamethox 7)  ! * Carafate 8)  ! * Ketorolac 9)  ! * Metronidazole 10)  ! Cipro 11)  ! Benicar (Olmesartan Medoxomil) 12)  ! Doxycycline 13)  ! * Hyomax 14)  ! * Lomotil 15)  ! Percocet 16)  ! Hydrochlorothiazide 17)  ! * Bextra 18)  ! * Ultracet 19)  ! Prevacid 20)  ! Ativan 21)  ! * Ritalin 22)  ! * Quetiapine 23)  ! * Seroquel 24)  ! Prozac 25)  ! * Tivan 26)  ! Keflex 27)  ! * Hydroduril 28)  ! * Abilify 29)  ! * Pristiq 30)  ! * Remeron 31)  ! Imitrex 32)  ! Lortab 33)  ! * Verpamil 34)  ! Relafen 35)  ! Lasix 36)  ! Vioxx 37)  ! * Lovox 38)  ! * Claindent 39)  ! * Estratest 40)  ! Celebrex 41)  ! * Mobic 42)  ! Celexa 43)  ! Entex 44)  ! Aciphex 45)  ! Zestril 46)   ! Adalat 47)  ! * Provera 48)  ! Wellbutrin 49)  ! Effexor 50)  ! Paxil 51)  ! * Ketek 52)  ! Clonidine Hcl 53)  ! * Serzone 54)  ! * Protonix 55)  Topamax Sprinkle (Topiramate) 56)  Norvasc (Amlodipine Besylate) 57)  Zoloft (Sertraline Hcl) 58)  Cefaclor (Cefaclor) 59)  * Zithromycin  Past History:  Past Medical History: Anxiety/Depression (?Bipolar) GERD Hypertension      HL ? TIA 2000     Osteoarthritis IBS Segmental iscemic colitis 8/09     Past Surgical History: Bladder Repair 1995  Cholecystectomy 2004    Hernia Surgery 1995y  Hysterectomy - partial 1992  Right Ankle fracture surgery 1999   Cataract Extraction - bilateral           Family History: No FH of Colon Cancer: Family History of Diabetes: mother, aunts, uncles Family History  of Heart Disease: father, aunt            Social History: Occupation: retired Patient is a former smoker. -stopped 1962 Alcohol Use - no  Illicit Drug Use - no      Daily Caffeine Use  -1 cup daily     Physical Exam  General:  alert, well-developed, and well-nourished.   Head:  normocephalic and atraumatic.   Eyes:  pupils equal, pupils round, and pupils reactive to light.   Ears:  R ear normal and no external deformities.   Neck:  supple and no masses.   Lungs:  normal respiratory effort, normal breath sounds, no crackles, and no wheezes.   Heart:  normal rate, regular rhythm, and no gallop.   Abdomen:  soft, non-tender, no masses, no guarding, and no rigidity.   Extremities:  trace left pedal edema and trace right pedal edema.   Neurologic:  cranial nerves II-XII intact and gait normal.   Psych:  normally interactive, good eye contact, not anxious appearing, and not depressed appearing.     Impression & Recommendations:  Problem # 1:  PREVENTIVE HEALTH CARE (ICD-V70.0) Reviewed adult health maintenance protocols.  Pt counseled on diet and execise esp considering borderline diabetes.    Mammogram: normal  (03/29/2009) Pap smear: Declined Hysterectomy (01/30/2010) Colonoscopy: 7 mm polyp, pathology hyperplastic internal and external hemorrhoids elongated colon (03/10/2007) Bone Density: Done (04/03/2008) Td Booster: given (09/14/2006)   Flu Vax: given (08/21/2009)   Pneumovax: given (09/09/1999) Chol: 163 (01/22/2010)   HDL: 52 (01/22/2010)   LDL: 73 (01/22/2010)   TG: 189 (01/22/2010) TSH: 1.712 (01/22/2010)   HgbA1C: 6.3 (01/22/2010)   Next Colonoscopy due:: 03/2012 (03/17/2008)  Problem # 2:  DIABETES MELLITUS, TYPE II, BORDERLINE (ICD-790.29) A1c is 6.3.  Pt counseled on diet and exercise.  She has multiple drug intolerances.  refer to nutritionist.  Labs Reviewed: Creat: 0.58 (01/22/2010)     Complete Medication List: 1)  Spironolactone 25 Mg Tabs (Spironolactone) .... Take 1 tablet by mouth once a day 2)  Diovan 320 Mg Tabs (Valsartan) .... Take 1 tablet by mouth once a day 3)  Premarin 1.25 Mg Tabs (Estrogens conjugated) .... Take 1 tablet by mouth once a day 4)  Librium 5 Mg Caps (Chlordiazepoxide hcl) .... Take 3 tablets by mouth two times a day 5)  Fish Oil 1000 Mg Caps (Omega-3 fatty acids) .... Take 1 tablet every 6 hours 6)  Citrucel 500 Mg Tabs (Methylcellulose (laxative)) .... Take 1 tablet by mouth once daily 7)  Vitamin D 2000 Unit Tabs (Cholecalciferol) .... Take 1 tablet by mouth once a day 8)  Flexeril 10 Mg Tabs (Cyclobenzaprine hcl) .... Take 2 tablets by mouth once daily as needed 9)  Multivitamins Tabs (Multiple vitamin) .... 2 tablets by mouth once daily 10)  Prilosec 20 Mg Cpdr (Omeprazole) .... Take 1/2 tablet by mouth as needed 11)  Prevalite 4 Gm Pack (Cholestyramine light) .Marland Kitchen.. 1 pack at bedtime may need to hold a dose if constipation occurs  Patient Instructions: 1)  Please schedule a follow-up appointment in 3 months. 2)  BMP prior to visit, ICD-9:  790.29 3)  HbgA1C prior to visit, ICD-9:  790.29 4)  Please return for lab work one (1) week before your  next appointment.  5)  Avoid sweets 6)  Limit your carbohydrate intake to 100 grams per day 7)  Start walking program Prescriptions: LIBRIUM 5 MG  CAPS (CHLORDIAZEPOXIDE HCL) Take 3 tablets by mouth two times  a day  #180 x 5   Entered and Authorized by:   D. Thomos Lemons DO   Signed by:   D. Thomos Lemons DO on 01/30/2010   Method used:   Print then Give to Patient   RxID:   [id number] SPIRONOLACTONE 25 MG  TABS (SPIRONOLACTONE) Take 1 tablet by mouth once a day  #30 x 5   Entered and Authorized by:   D. Thomos Lemons DO   Signed by:   D. Thomos Lemons DO on 01/30/2010   Method used:   Electronically to        [facility]* (retail)       [address] [place], [place]  [address]       Ph: [phone] or [phone]       Fax: [phone]   RxID:   [id number]   Current Allergies (reviewed today): ! * AZITHROMYCIN ! Prudy Feeler ! * LAMICTAL ! DEPAKOTE ! * TRANXENE ! * TRIMETHO/SULAMETHOX ! * CARAFATE ! * KETOROLAC ! * METRONIDAZOLE ! CIPRO ! BENICAR (OLMESARTAN MEDOXOMIL) ! DOXYCYCLINE ! * HYOMAX ! * LOMOTIL ! PERCOCET ! HYDROCHLOROTHIAZIDE ! * BEXTRA ! * ULTRACET ! PREVACID ! ATIVAN ! * RITALIN ! * QUETIAPINE ! * SEROQUEL ! PROZAC ! Isabel Caprice ! KEFLEX ! * HYDRODURIL ! * ABILIFY ! * PRISTIQ ! * REMERON ! IMITREX ! LORTAB ! * VERPAMIL ! RELAFEN ! LASIX ! VIOXX ! * LOVOX ! * CLAINDENT ! * ESTRATEST ! CELEBREX ! * MOBIC ! CELEXA ! ENTEX ! ACIPHEX ! ZESTRIL ! ADALAT ! * PROVERA ! WELLBUTRIN ! [facility] ! PAXIL ! * KETEK ! CLONIDINE HCL ! * SERZONE ! * PROTONIX TOPAMAX SPRINKLE (TOPIRAMATE) NORVASC (AMLODIPINE BESYLATE) ZOLOFT (SERTRALINE HCL) CEFACLOR (CEFACLOR) * ZITHROMYCIN   Preventive Care Screening  Pap Smear:    Date:  01/30/2010    Results:  Declined Hysterectomy

## 2010-12-26 NOTE — Letter (Signed)
Summary: Primary Care Consult Scheduled Letter  [facility]  [address]   [place], [place] [address]   Phone: [phone]  Fax: [phone]      12/17/2009 MRN: [id number]  YMANI PORCHER [address] [place], [place]  [address]    Dear Ms. Dareen Piano,      We have scheduled an appointment for you.  At the recommendation of Dr.Yoo, we have scheduled you a consult with [facility] , Dr Jearld Fenton   on February 4,2011 at 1:40pm.  Their address [address]  [place]  . The office phone number is [phone].  If this appointment day and time is not convenient for you, please feel free to call the office of the doctor you are being referred to at the number listed above and reschedule the appointment.     It is important for you to keep your scheduled appointments. We are here to make sure you are given good patient care. If you have questions or you have made changes to your appointment, please notify us at  [phone], ask for [facility].    Thank you,  Patient Care Coordinator [facility]

## 2010-12-26 NOTE — Assessment & Plan Note (Signed)
Summary: sinus /mhf--Rm 3   Vital Signs:  Patient profile:   77 year old female Height:      65.5 inches Weight:      170.25 pounds BMI:     28.00 O2 Sat:      98 % on Room air Temp:     97.6 degrees F oral Pulse rate:   68 / minute Pulse rhythm:   regular Resp:     18 per minute BP sitting:   128 / 74  (right arm) Cuff size:   regular  Vitals Entered By: Mervin Kung CMA (AAMA) (July 30, 2010 1:12 PM)  O2 Flow:  Room air CC: Room 3   Headache daily x 3 months, eyes and nose hurts; not relieved by OTC anithistamines. Is Patient Diabetic? Yes Comments Pt states Premarin has been stopped by [facility]. Pt stopped Prilosec because she doesn't need it anymore. Nicki Guadalajara Fergerson CMA Duncan Dull)  July 30, 2010 1:21 PM    Primary Care Provider:  Dondra Spry DO  CC:  Room 3   Headache daily x 3 months and eyes and nose hurts; not relieved by OTC anithistamines..  History of Present Illness: 77 y/o white female chronic headache since June  bitemporal, sinuses hurt tried zyrtec, claritin she has been taking OTC meds,  usually takes ibuprofen 600 mg by mouth three times a day also takes excedrin migraine  front part of head sore to touch severity can be - 10 out of 10    Preventive Screening-Counseling & Management  Alcohol-Tobacco     Smoking Status: quit     Year Quit: 1970  Allergies: 1)  ! * Azithromycin 2)  ! Xanax 3)  ! * Lamictal 4)  ! Depakote 5)  ! * Tranxene 6)  ! * Trimetho/sulamethox 7)  ! * Carafate 8)  ! * Ketorolac 9)  ! * Metronidazole 10)  ! Cipro 11)  ! Benicar (Olmesartan Medoxomil) 12)  ! Doxycycline 13)  ! * Hyomax 14)  ! * Lomotil 15)  ! Percocet 16)  ! Hydrochlorothiazide 17)  ! * Bextra 18)  ! * Ultracet 19)  ! Prevacid 20)  ! Ativan 21)  ! * Ritalin 22)  ! * Quetiapine 23)  ! * Seroquel 24)  ! Prozac 25)  ! * Tivan 26)  ! Keflex 27)  ! * Hydroduril 28)  ! * Abilify 29)  ! * Pristiq 30)  ! * Remeron 31)  ! Imitrex 32)  !  Lortab 33)  ! * Verpamil 34)  ! Relafen 35)  ! Lasix 36)  ! Vioxx 37)  ! * Lovox 38)  ! * Claindent 39)  ! * Estratest 40)  ! Celebrex 41)  ! * Mobic 42)  ! Celexa 43)  ! Entex 44)  ! Aciphex 45)  ! Zestril 46)  ! Adalat 47)  ! * Provera 48)  ! Wellbutrin 49)  ! Effexor 50)  ! Paxil 51)  ! * Ketek 52)  ! Clonidine Hcl 53)  ! * Serzone 54)  ! * Protonix 55)  Topamax Sprinkle (Topiramate) 56)  Norvasc (Amlodipine Besylate) 57)  Zoloft (Sertraline Hcl) 58)  Cefaclor (Cefaclor) 59)  * Zithromycin  Past History:  Past Medical History: Anxiety/Depression (?Bipolar) GERD  Hypertension      HL ? TIA 2000     Osteoarthritis IBS Segmental iscemic colitis 8/09     Past Surgical History: Bladder Repair 1995  Cholecystectomy 2004  Hernia Surgery 1995y  Hysterectomy - partial 1992   Right Ankle fracture surgery 1999   Cataract Extraction - bilateral           Family History: No FH of Colon Cancer: Family History of Diabetes: mother, aunts, uncles Family History of Heart Disease: father, aunt              Social History: Occupation: retired Patient is a former smoker. -stopped 65 Alcohol Use - no  Illicit Drug Use - no       Daily Caffeine Use  -1 cup daily      Physical Exam  General:  alert, well-developed, and well-nourished.   Head:  no temporal tenderness Eyes:  pupils equal, pupils round, and pupils reactive to light.   Ears:  R ear normal and L ear normal.   Mouth:  pharynx pink and moist.   Lungs:  normal respiratory effort and normal breath sounds.   Heart:  normal rate, regular rhythm, and no gallop.   Neurologic:  cranial nerves II-XII intact, strength normal in all extremities, gait normal, and DTRs symmetrical and normal.   Psych:  normally interactive, good eye contact, not anxious appearing, and not depressed appearing.     Impression & Recommendations:  Problem # 1:  HEADACHE (ICD-784.0) 77 y/o chronic headache.  rule out arteritis.   check CT of brain I suspect tension vs tension migraine.  rebound analgesic HA also possibilty stop all OTC analgesics. trial of muscle relaxer   Orders: T-Sed Rate (Automated) ([id number]) CT without Contrast (CT w/o contrast)  Complete Medication List: 1)  Spironolactone 25 Mg Tabs (Spironolactone) .... Take 1 tablet by mouth once a day 2)  Diovan 320 Mg Tabs (Valsartan) .... Take 1 tablet by mouth once a day 3)  Premarin 1.25 Mg Tabs (Estrogens conjugated) .... Take 1 tablet by mouth once a day 4)  Librium 5 Mg Caps (Chlordiazepoxide hcl) .... Take 1 tablet by mouth once a day. 5)  Fish Oil 1000 Mg Caps (Omega-3 fatty acids) .... Take 1 tablet every 6 hours 6)  Citrucel 500 Mg Tabs (Methylcellulose (laxative)) .... Take 1 tablet by mouth once daily 7)  Vitamin D 2000 Unit Tabs (Cholecalciferol) .... Take 1 tablet by mouth once a day 8)  Multivitamins Tabs (Multiple vitamin) .... 2 tablets by mouth once daily 9)  Prilosec 20 Mg Cpdr (Omeprazole) .... Take 1/2 tablet by mouth as needed 10)  Prevalite 4 Gm Pack (Cholestyramine light) .Marland Kitchen.. 1 pack at bedtime may need to hold a dose if constipation occurs 11)  Maalox Regular Strength 200-200-20 Mg/81ml Susp (Alum & mag hydroxide-simeth) .... As needed 12)  Amitriptyline Hcl 25 Mg Tabs (Amitriptyline hcl) .... 1/2 to one tab by mouth at bedtime as needed 13)  Vitamin E  .... Take 1 capsule by mouth once a day. 14)  Vitamin C  .... Take 1 tablet by mouth once a day. 15)  Selenium  .... Take 1 tablet by mouth once a day. 16)  Cyclobenzaprine Hcl 5 Mg Tabs (Cyclobenzaprine hcl) .... One by mouth at bedtime as needed  Other Orders: T- * Misc. Laboratory test [id number]) T-Basic Metabolic Panel [id number])  Patient Instructions: 1)  Please schedule a follow-up appointment in 2 weeks. Prescriptions: CYCLOBENZAPRINE HCL 5 MG TABS (CYCLOBENZAPRINE HCL) one by mouth at bedtime as needed  #30 x 0   Entered and Authorized by:   D. Thomos Lemons DO    Signed by:   D. Thomos Lemons DO  on 07/30/2010   Method used:   Electronically to        [facility]* (retail)       [address] [place], [place]  [address]       Ph: [phone] or [phone]       Fax: [phone]   RxID:   [id number]   Current Allergies (reviewed today): ! * AZITHROMYCIN ! Prudy Feeler ! * LAMICTAL ! DEPAKOTE ! * TRANXENE ! * TRIMETHO/SULAMETHOX ! * CARAFATE ! * KETOROLAC ! * METRONIDAZOLE ! CIPRO ! BENICAR (OLMESARTAN MEDOXOMIL) ! DOXYCYCLINE ! * HYOMAX ! * LOMOTIL ! PERCOCET ! HYDROCHLOROTHIAZIDE ! * BEXTRA ! * ULTRACET ! PREVACID ! ATIVAN ! * RITALIN ! * QUETIAPINE ! * SEROQUEL ! PROZAC ! Isabel Caprice ! KEFLEX ! * HYDRODURIL ! * ABILIFY ! * PRISTIQ ! * REMERON ! IMITREX ! LORTAB ! * VERPAMIL ! RELAFEN ! LASIX ! VIOXX ! * LOVOX ! * CLAINDENT ! * ESTRATEST ! CELEBREX ! * MOBIC ! CELEXA ! ENTEX ! ACIPHEX ! ZESTRIL ! ADALAT ! * PROVERA ! WELLBUTRIN ! [facility] ! PAXIL ! * KETEK ! CLONIDINE HCL ! * SERZONE ! * PROTONIX TOPAMAX SPRINKLE (TOPIRAMATE) NORVASC (AMLODIPINE BESYLATE) ZOLOFT (SERTRALINE HCL) CEFACLOR (CEFACLOR) * ZITHROMYCIN

## 2010-12-26 NOTE — Progress Notes (Signed)
Summary: Librium Refill  Phone Note Call from Patient   [name]: Patient Call For: yoo  Summary of Call: [facility] says they did not get the librium rx please resend  Initial call taken by: Roselle Locus,  October 21, 2010 9:22 AM  Follow-up for Phone Call        Phone Call Completed Follow-up by: Glendell Docker CMA,  October 21, 2010 2:05 PM    Prescriptions: LIBRIUM 5 MG  CAPS (CHLORDIAZEPOXIDE HCL) Take 1 tablet once to three times per day as needed  #90 x 2   Entered by:   Glendell Docker CMA   Authorized by:   D. Thomos Lemons DO   Signed by:   Glendell Docker CMA on 10/21/2010   Method used:   Electronically to        [facility]* (retail)       [address] [place], [place]  [address]       Ph: [phone] or [phone]       Fax: [phone]   RxID:   [id number]

## 2010-12-26 NOTE — Progress Notes (Signed)
Summary: Congestion   Phone Note Call from Patient Call back at [phone]   [name]: patient live Call For: Cynthia Gilmore  Summary of Call: [name] is not working.  She has tried Mucinex Claritin and sudifed and none of that is working.  She is having headaches.  The mucus from her nose is thick and yellow.   Initial call taken by: Roselle Locus,  May 14, 2010 9:41 AM  Follow-up for Phone Call        call returned to patient, she states she has a continuos headache, nasal drainage yellow in color, head congestion, claritin, nasal spray, sudafed , net med all with no relief.Ongoing since last office visit, with worsening of symptoms Follow-up by: Glendell Docker CMA,  May 14, 2010 10:26 AM  Additional Follow-up for Phone Call Additional follow up Details #1::        see rx for ceftin and prednisone.  if no improvement, OV within 1 week Additional Follow-up by: D. Thomos Lemons DO,  May 14, 2010 5:02 PM    Additional Follow-up for Phone Call Additional follow up Details #2::    patient advised per Dr Artist Pais instructions Follow-up by: Glendell Docker CMA,  May 14, 2010 5:20 PM  New/Updated Medications: CEFUROXIME AXETIL 500 MG TABS (CEFUROXIME AXETIL) one by mouth bid PREDNISONE 20 MG TABS (PREDNISONE) one by mouth two times a day x 4 days, 1/2 by mouth two times a day x 4 days, then 1/2 by mouth once daily x 4 days Prescriptions: PREDNISONE 20 MG TABS (PREDNISONE) one by mouth two times a day x 4 days, 1/2 by mouth two times a day x 4 days, then 1/2 by mouth once daily x 4 days  #12 x 0   Entered and Authorized by:   D. Thomos Lemons DO   Signed by:   D. Thomos Lemons DO on 05/14/2010   Method used:   Electronically to        [facility]* (retail)       [address] [place], [place]  [address]       Ph: [phone] or [phone]       Fax: [phone]   RxID:   [id number] CEFUROXIME AXETIL 500 MG TABS (CEFUROXIME AXETIL) one by  mouth bid  #20 x 0   Entered and Authorized by:   D. Thomos Lemons DO   Signed by:   D. Thomos Lemons DO on 05/14/2010   Method used:   Electronically to        [facility]* (retail)       [address] [place], [place]  [address]       Ph: [phone] or [phone]       Fax: [phone]   RxID:   [id number]

## 2010-12-26 NOTE — Progress Notes (Signed)
Summary: chlordiazepoxide denial  Phone Note Other Incoming   [name]: Fax from [facility] [phone]  Fax) [phone] Summary of Call: Member ID [id number] Reference # [id number] Received fax from [facility] that Chlordiazepoxide has been denied because it is not covered under her plan. Info. has been forwarded to Provider for review.  Is there another alternative pt can try? Nicki Guadalajara Fergerson CMA Duncan Dull)  October 21, 2010 4:43 PM   Follow-up for Phone Call        I would call local pharmacy to see if they carry and associated cost Follow-up by: D. Thomos Lemons DO,  October 21, 2010 5:21 PM  Additional Follow-up for Phone Call Additional follow up Details #1::        Spoke to pharmacist at [facility] and she states that med is not coverd under Medicare Part D. A 90 day supply costs pt $28.72. Rx was filled yesterday and has already been picked up. Nicki Guadalajara Fergerson CMA Duncan Dull)  October 22, 2010 9:32 AM

## 2010-12-26 NOTE — Consult Note (Signed)
Summary: [facility]  [facility]   Imported By: Lanelle Bal 04/10/2010 08:47:45  _____________________________________________________________________  External Attachment:    Type:   Image     Comment:   External Document

## 2010-12-26 NOTE — Progress Notes (Signed)
Summary: CT Results  Phone Note Outgoing Call   Summary of Call: call pt - chest x ray shows ectasia or widening of thoracic aorta.   I suggest CT of Chest with IV contrast.   CT needs to be completed 12/28/09  pl Initial call taken by: D. Thomos Lemons DO,  December 27, 2009 6:53 PM  Follow-up for Phone Call        Appt   [facility]     Feb  4  @  4Pm   Follow-up by: Darral Dash,  December 28, 2009 2:56 PM  Additional Follow-up for Phone Call Additional follow up Details #1::        Call pt -  CT of chest - negative Additional Follow-up by: D. Thomos Lemons DO,  December 28, 2009 4:42 PM    Additional Follow-up for Phone Call Additional follow up Details #2::    attempted to contact patient at [phone] no answer,detailed voice message left informing patient per Dr Artist Pais instructions Follow-up by: Glendell Docker CMA,  December 28, 2009 4:51 PM

## 2010-12-26 NOTE — Assessment & Plan Note (Signed)
Summary: 2 WEEK FU/DT   Vital Signs:  Patient profile:   77 year old female Weight:      168.50 pounds BMI:     27.71 O2 Sat:      100 % on Room air Temp:     97.5 degrees F oral Pulse rate:   70 / minute Pulse rhythm:   regular Resp:     20 per minute BP sitting:   110 / 70  (right arm) Cuff size:   large  Vitals Entered By: Glendell Docker CMA (August 15, 2010 2:08 PM)  O2 Flow:  Room air CC: 2 Week Follow up  Is Patient Diabetic? No Pain Assessment Patient in pain? no      Comments would like to stop taking Aldactone   Primary Care Provider:  Dondra Spry DO  CC:  2 Week Follow up .  History of Present Illness: 77 y/o female for f/u  headaches are much better good response to muscle relaxer she stopped otc analgesics   Preventive Screening-Counseling & Management  Alcohol-Tobacco     Smoking Status: quit  Allergies: 1)  ! * Azithromycin 2)  ! Xanax 3)  ! * Lamictal 4)  ! Depakote 5)  ! * Tranxene 6)  ! * Trimetho/sulamethox 7)  ! * Carafate 8)  ! * Ketorolac 9)  ! * Metronidazole 10)  ! Cipro 11)  ! Benicar (Olmesartan Medoxomil) 12)  ! Doxycycline 13)  ! * Hyomax 14)  ! * Lomotil 15)  ! Percocet 16)  ! Hydrochlorothiazide 17)  ! * Bextra 18)  ! * Ultracet 19)  ! Prevacid 20)  ! Ativan 21)  ! * Ritalin 22)  ! * Quetiapine 23)  ! * Seroquel 24)  ! Prozac 25)  ! * Tivan 26)  ! Keflex 27)  ! * Hydroduril 28)  ! * Abilify 29)  ! * Pristiq 30)  ! * Remeron 31)  ! Imitrex 32)  ! Lortab 33)  ! * Verpamil 34)  ! Relafen 35)  ! Lasix 36)  ! Vioxx 37)  ! * Lovox 38)  ! * Claindent 39)  ! * Estratest 40)  ! Celebrex 41)  ! * Mobic 42)  ! Celexa 43)  ! Entex 44)  ! Aciphex 45)  ! Zestril 46)  ! Adalat 47)  ! * Provera 48)  ! Wellbutrin 49)  ! Effexor 50)  ! Paxil 51)  ! * Ketek 52)  ! Clonidine Hcl 53)  ! * Serzone 54)  ! * Protonix 55)  Topamax Sprinkle (Topiramate) 56)  Norvasc (Amlodipine Besylate) 57)  Zoloft  (Sertraline Hcl) 58)  Cefaclor (Cefaclor) 59)  * Zithromycin  Past History:  Past Medical History: Anxiety/Depression (?Bipolar) GERD  Hypertension      HL ? TIA 2000     Osteoarthritis  IBS Segmental iscemic colitis 8/09     Family History: No FH of Colon Cancer: Family History of Diabetes: mother, aunts, uncles Family History of Heart Disease: father, aunt             Father:  Mother:  Siblings:     Physical Exam  General:  alert, well-developed, and well-nourished.   Lungs:  normal respiratory effort and normal breath sounds.   Heart:  normal rate, regular rhythm, and no gallop.   Extremities:  No lower extremity edema   Impression & Recommendations:  Problem # 1:  HYPERTENSION (ICD-401.9) Assessment Unchanged  Her updated medication list for  this problem includes:    Spironolactone 25 Mg Tabs (Spironolactone) .Marland Kitchen... Take 1 tablet by mouth once a day    Diovan 320 Mg Tabs (Valsartan) .Marland Kitchen... Take 1 tablet by mouth once a day  BP today: 110/70 Prior BP: 128/74 (07/30/2010)  Labs Reviewed: K+: 4.3 (07/30/2010) Creat: : 0.63 (07/30/2010)   Chol: 163 (01/22/2010)   HDL: 52 (01/22/2010)   LDL: 73 (01/22/2010)   TG: 189 (01/22/2010)  Problem # 2:  HEADACHE (ICD-784.0) Assessment: Improved  Complete Medication List: 1)  Spironolactone 25 Mg Tabs (Spironolactone) .... Take 1 tablet by mouth once a day 2)  Diovan 320 Mg Tabs (Valsartan) .... Take 1 tablet by mouth once a day 3)  Librium 5 Mg Caps (Chlordiazepoxide hcl) .... Take 1 tablet by mouth once a day. 4)  Fish Oil 1000 Mg Caps (Omega-3 fatty acids) .... Take 1 tablet every 6 hours 5)  Citrucel 500 Mg Tabs (Methylcellulose (laxative)) .... Take 1 tablet by mouth once daily 6)  Vitamin D 2000 Unit Tabs (Cholecalciferol) .... Take 1 tablet by mouth once a day 7)  Multivitamins Tabs (Multiple vitamin) .... 2 tablets by mouth once daily 8)  Prevalite 4 Gm Pack (Cholestyramine light) .Marland Kitchen.. 1 pack at bedtime may  need to hold a dose if constipation occurs 9)  Maalox Regular Strength 200-200-20 Mg/77ml Susp (Alum & mag hydroxide-simeth) .... As needed 10)  Amitriptyline Hcl 25 Mg Tabs (Amitriptyline hcl) .... 1/2 to one tab by mouth at bedtime as needed 11)  Vitamin E  .... Take 1 capsule by mouth once a day. 12)  Vitamin C  .... Take 1 tablet by mouth once a day. 13)  Selenium  .... Take 1 tablet by mouth once a day. 14)  Cyclobenzaprine Hcl 5 Mg Tabs (Cyclobenzaprine hcl) .... One by mouth at bedtime as needed  Other Orders: Influenza Vaccine MCR ([id number]) Flu Vaccine 18yrs + MEDICARE PATIENTS (U9811)  Patient Instructions: 1)  Please schedule a follow-up appointment in 3 months.  Current Allergies (reviewed today): ! * AZITHROMYCIN ! XANAX ! * LAMICTAL ! DEPAKOTE ! * TRANXENE ! * TRIMETHO/SULAMETHOX ! * CARAFATE ! * KETOROLAC ! * METRONIDAZOLE ! CIPRO ! BENICAR (OLMESARTAN MEDOXOMIL) ! DOXYCYCLINE ! * HYOMAX ! * LOMOTIL ! PERCOCET ! HYDROCHLOROTHIAZIDE ! * BEXTRA ! * ULTRACET ! PREVACID ! ATIVAN ! * RITALIN ! * QUETIAPINE ! * SEROQUEL ! PROZAC ! Isabel Caprice ! KEFLEX ! * HYDRODURIL ! * ABILIFY ! * PRISTIQ ! * REMERON ! IMITREX ! LORTAB ! * VERPAMIL ! RELAFEN ! LASIX ! VIOXX ! * LOVOX ! * CLAINDENT ! * ESTRATEST ! CELEBREX ! * MOBIC ! CELEXA ! ENTEX ! ACIPHEX ! ZESTRIL ! ADALAT ! * PROVERA ! WELLBUTRIN ! [facility] ! PAXIL ! * KETEK ! CLONIDINE HCL ! * SERZONE ! * PROTONIX TOPAMAX SPRINKLE (TOPIRAMATE) NORVASC (AMLODIPINE BESYLATE) ZOLOFT (SERTRALINE HCL) CEFACLOR (CEFACLOR) * ZITHROMYCIN   Immunizations Administered:  Influenza Vaccine # 1:    Vaccine Type: Fluvax MCR    Site: right deltoid    Mfr: [email or web address]    Dose: 0.5 ml    Route: IM    Given by: Glendell Docker CMA    Exp. Date: 05/24/2011    Lot #: [id number]    VIS given: 06/18/10 version given August 15, 2010.  Flu Vaccine Consent Questions:    Do you have a history of severe  allergic reactions to this vaccine? no    Any prior history of allergic reactions  to egg and/or gelatin? no    Do you have a sensitivity to the preservative Thimersol? no    Do you have a past history of Guillan-Barre Syndrome? no    Do you currently have an acute febrile illness? no    Have you ever had a severe reaction to latex? no    Vaccine information given and explained to patient? yes    Are you currently pregnant? no

## 2010-12-26 NOTE — Progress Notes (Signed)
Summary: Librium Refill  Phone Note Refill Request Message from:  Fax from Pharmacy on Mar 28, 2010 5:27 PM  Refills Requested: Medication #1:  LIBRIUM 5 MG  CAPS Take 3 tablets by mouth two times a day   Dosage confirmed as above?Dosage Confirmed   Brand Name Necessary? No   Supply Requested: 3 months   Last Refilled: 01/24/2010  Method Requested: Electronic Next Appointment Scheduled: 05/01/10 Dr Artist Pais Initial call taken by: Glendell Docker CMA,  Mar 28, 2010 5:29 PM  Follow-up for Phone Call        Spoke to pharmacist re: refill sent in on 01/30/10 with 5 refills. She states they did not receive it but will add it to the pt's profile.  Mervin Kung CMA  Mar 29, 2010 9:22 AM

## 2010-12-26 NOTE — Progress Notes (Signed)
Summary: refill librium   Phone Note Refill Request Call back at Home Phone [phone] Message from:  Patient on October 16, 2010 8:26 AM  Refills Requested: Medication #1:  LIBRIUM 5 MG  CAPS Take 1 tablet by mouth once a day.   Dosage confirmed as above?Dosage Confirmed   Brand Name Necessary? No   Supply Requested: 1 month   Notes: hwe is taking 2 to 3 pills a day needs a quantity of 90 [facility]   she will run out over the weekend  *** Please advise how pt should be taking this med. ***   Method Requested: Electronic Next Appointment Scheduled: 10-31-2010 Dr Artist Pais  Initial call taken by: Roselle Locus,  October 16, 2010 8:27 AM  Follow-up for Phone Call        Pt notified that rx has been sent. Nicki Guadalajara Fergerson CMA (AAMA)  October 16, 2010 1:17 PM     New/Updated Medications: LIBRIUM 5 MG  CAPS (CHLORDIAZEPOXIDE HCL) Take 1 tablet once to three times per day as needed Prescriptions: LIBRIUM 5 MG  CAPS (CHLORDIAZEPOXIDE HCL) Take 1 tablet once to three times per day as needed  #90 x 2   Entered and Authorized by:   D. Thomos Lemons DO   Signed by:   D. Thomos Lemons DO on 10/16/2010   Method used:   Telephoned to ...       [facility]* (retail)       [address] [place], [place]  [address]       Ph: [phone] or [phone]       Fax: [phone]   RxID:   [id number]

## 2010-12-26 NOTE — Miscellaneous (Signed)
Summary: Lab Orders  Clinical Lists Changes  Orders: Added new Test order of T-Basic Metabolic Panel ([id number]) - Signed Added new Test order of T- Hemoglobin A1C ([id number]) - Signed 

## 2010-12-26 NOTE — Assessment & Plan Note (Signed)
Summary: rov    Visit Type:  Follow-up Referring Provider:  n/a Primary Provider:  Dondra Spry DO  CC:  chest pain.  History of Present Illness: The patient presents today for cardiology followup.   She reports having R foot surgery in December.  Since that time, she has noticed chest discomfort.  She describes her pain as sharp and radiating into her jaws.  This pain only occurs when supine and at night.  She frequently wakens from sleep with pain.  She has tried sucralfate, PPIs, and H2 blockers without relief.  She denies exertional chest pain.   The patient denies symptoms of palpitations,  shortness of breath, orthopnea, PND, lower extremity edema, dizziness, presyncope, syncope, or neurologic sequela. The patient is tolerating medications without difficulties and is otherwise without complaint today.   Current Medications (verified): 1)  Spironolactone 25 Mg  Tabs (Spironolactone) .... Take 1 Tablet By Mouth Once A Day 2)  Diovan 320 Mg  Tabs (Valsartan) .... Take 1 Tablet By Mouth Once A Day 3)  Premarin 1.25 Mg Tabs (Estrogens Conjugated) .... Take 1 Tablet By Mouth Once A Day 4)  Librium 5 Mg  Caps (Chlordiazepoxide Hcl) .... Take 3 Tablets By Mouth Two Times A Day 5)  Fish Oil 1000 Mg  Caps (Omega-3 Fatty Acids) .... Take 1 Tablet Every 6 Hours 6)  Citrucel 500 Mg Tabs (Methylcellulose (Laxative)) .... Take 1 Tablet By Mouth Once Daily 7)  Vitamin D 2000 Unit Tabs (Cholecalciferol) .... Take 1 Tablet By Mouth Once A Day 8)  Flexeril 10 Mg Tabs (Cyclobenzaprine Hcl) .... Take 2 Tablets By Mouth Once Daily As Needed 9)  Pepcid Ac 10 Mg Tabs (Famotidine) .... Take As Needed 10)  Multivitamins  Tabs (Multiple Vitamin) .... 2 Tablets By Mouth Once Daily 11)  Prilosec 20 Mg Cpdr (Omeprazole) .... Take 1/2 Tablet By Mouth As Needed 12)  Prevalite 4 Gm Pack (Cholestyramine Light) .Marland Kitchen.. 1 Pack At Bedtime May Need To Hold A Dose If Constipation Occurs 13)  Sucralfate 1 Gm/33ml Susp  (Sucralfate) .Marland Kitchen.. 10 Ml Po Once To Two Times A Day  Allergies: 1)  ! * Azithromycin 2)  ! Xanax 3)  ! * Lamictal 4)  ! Depakote 5)  ! * Tranxene 6)  ! * Trimetho/sulamethox 7)  ! * Carafate 8)  ! * Ketorolac 9)  ! * Metronidazole 10)  ! Cipro 11)  ! Benicar (Olmesartan Medoxomil) 12)  ! Doxycycline 13)  ! * Hyomax 14)  ! * Lomotil 15)  ! Percocet 16)  ! Hydrochlorothiazide 17)  ! * Bextra 18)  ! * Ultracet 19)  ! Prevacid 20)  ! Ativan 21)  ! * Ritalin 22)  ! * Quetiapine 23)  ! * Seroquel 24)  ! Prozac 25)  ! * Tivan 26)  ! Keflex 27)  ! * Hydroduril 28)  ! * Abilify 29)  ! * Pristiq 30)  ! * Remeron 31)  ! Imitrex 32)  ! Lortab 33)  ! * Verpamil 34)  ! Relafen 35)  ! Lasix 36)  ! Vioxx 37)  ! * Lovox 38)  ! * Claindent 39)  ! * Estratest 40)  ! Celebrex 41)  ! * Mobic 42)  ! Celexa 43)  ! Entex 44)  ! Aciphex 45)  ! Zestril 46)  ! Adalat 47)  ! * Provera 48)  ! Wellbutrin 49)  ! Effexor 50)  ! Paxil 51)  ! * Ketek 52)  !  Clonidine Hcl 53)  ! * Serzone 54)  ! * Protonix 55)  Topamax Sprinkle (Topiramate) 56)  Norvasc (Amlodipine Besylate) 57)  Zoloft (Sertraline Hcl) 58)  Cefaclor (Cefaclor) 59)  * Zithromycin  Past History:  Past Medical History: Anxiety/Depression (?Bipolar) GERD Hypertension      HL ? TIA 2000    Osteoarthritis IBS Segmental iscemic colitis 8/09     Past Surgical History: Reviewed history from 01/02/2010 and no changes required. Bladder Repair 1995  Cholecystectomy 2004    Hernia Surgery 1995y  Hysterectomy - partial 1992  Right Ankle fracture surgery 1999   Cataract Extraction - bilateral          Family History: Reviewed history from 01/02/2010 and no changes required. No FH of Colon Cancer: Family History of Diabetes: mother, aunts, uncles Family History of Heart Disease: father, aunt           Social History: Reviewed history from 01/02/2010 and no changes required. Occupation: retired Patient is  a former smoker. -stopped 1962 Alcohol Use - no  Illicit Drug Use - no      Daily Caffeine Use  -1 cup daily    Review of Systems       All systems are reviewed and negative except as listed in the HPI.   Vital Signs:  Patient profile:   77 year old female Height:      65.5 inches Weight:      185.75 pounds BMI:     30.55 Pulse rate:   68 / minute Pulse rhythm:   regular Resp:     18 per minute BP sitting:   139 / 70  (left arm) Cuff size:   large  Vitals Entered By: Vikki Ports (January 10, 2010 2:29 PM)  Physical Exam  General:  alert, well-developed, and well-nourished.   Head:  normocephalic and atraumatic.   Eyes:  vision grossly intact, pupils equal, pupils round, and pupils reactive to light.  no nystagmus Mouth:  No oral lesions. Tongue moist.  Neck:  supple and no masses.   Chest Wall:  nontender Lungs:  normal respiratory effort and normal breath sounds.   Heart:  Non-displaced PMI, chest non-tender; regular rate and rhythm, S1, S2 without murmurs, rubs or gallops. Carotid upstroke normal, no bruit. Normal abdominal aortic size, no bruits. Femorals normal pulses, no bruits. Pedals normal pulses. No edema, no varicosities. Abdomen:  Bowel sounds positive; abdomen soft and non-tender without masses, organomegaly, or hernias noted. No hepatosplenomegaly. Msk:  uses a cane.  R foot in brace Pulses:  pulses normal in all 4 extremities Extremities:  No clubbing or cyanosis.  no homans or cords Neurologic:  Alert and oriented x 3. Skin:  Intact without lesions or rashes. Cervical Nodes:  no significant adenopathy Psych:  Normal affect.   CT of Chest  Procedure date:  12/28/2009  Findings:         IMPRESSION:   No acute chest pathology.  Mild linear scarring in both lower   lungs.    Mild atherosclerosis of the aorta.  Maximal diameter of the   ascending aorta is 3.7 cm.  No dissection.    Read By:  Thomasenia Sales,  M.D.   Released By:  Thomasenia Sales,   M.D.  CXR  Procedure date:  12/27/2009  Findings:       Comparison: 07/10/2008    Findings: Normal heart size and shape.  Ectasia of the thoracic   aorta.  No pulmonary vascular  congestion.  Lungs are clear of an   active process.  The bony thorax is intact.  Cholecystectomy clips.    IMPRESSION:   No acute chest findings.  Concern for ectasia of mainly of the   ascending aorta. Does the patient have a murmur?    Read By:  Jonne Ply,  M.D.   Released By:  Jonne Ply,  M.D.  Echocardiogram  Procedure date:  09/19/2008  Findings:       SUMMARY   -  Overall left ventricular systolic function was normal. Left         ventricular ejection fraction was estimated , range being 55         % to 65 %. There were no left ventricular regional wall         motion abnormalities. There was mild focal basal septal         hypertrophy. There was systolic anterior motion of the mitral         valve.   -  The left atrium was mildly dilated.   -  The estimated peak pulmonary artery systolic pressure was mildly         increased.     ---------------------------------------------------------------    Prepared and Electronically Authenticated by    Olga Millers M.D.   Confirmed 19-Sep-2008 16:54:14      EKG  Procedure date:  01/10/2010  Findings:      sinus rhythm 68 bpm, PR 150, RBBB, nonspecific ST/T changes  Impression & Recommendations:  Problem # 1:  CHEST PAIN, ATYPICAL (ICD-786.59)  The patient presents today for further evaluation of chest pain.  Her symptoms have both typical and atypical features.  At this point, I would recommend lexiscan myoview.  If low risk, then continue GI evaluation and consideration of other causes of discomfort.  If moderate to high risk, then we will consider cath.  No medicine changes today.  Orders: Nuclear Stress Test (Nuc Stress Test)  Problem # 2:  HYPERTENSION (ICD-401.9)  stable no changes today  Her updated  medication list for this problem includes:    Spironolactone 25 Mg Tabs (Spironolactone) .Marland Kitchen... Take 1 tablet by mouth once a day    Diovan 320 Mg Tabs (Valsartan) .Marland Kitchen... Take 1 tablet by mouth once a day  Orders: Nuclear Stress Test (Nuc Stress Test)  Problem # 3:  TACHYCARDIA (ICD-785.0)  Prior sinus tachycardia appears resolved  Orders: Nuclear Stress Test (Nuc Stress Test)  Patient Instructions: 1)  Your physician has requested that you have an lexiscan myoview.  For further information please visit [email or web address]  Please follow instruction sheet, as given. 2)  Your physician recommends that you schedule a follow-up appointment in: as needed will call with your stress test results

## 2010-12-26 NOTE — Assessment & Plan Note (Signed)
Summary: Left Ear stopped-up- jr   Vital Signs:  Patient profile:   77 year old female O2 Sat:      97 % on Room air Temp:     98.0 degrees F oral Pulse rate:   78 / minute Pulse rhythm:   regular Resp:     16 per minute BP sitting:   112 / 70  (left arm) Cuff size:   regular  Vitals Entered By: Glendell Docker CMA (December 17, 2009 2:04 PM)  O2 Flow:  Room air  Primary Care Provider:  D. Thomos Lemons DO  CC:  Ear pain.  History of Present Illness:  Ear Pain      This is a 77 year old woman who presents with Ear pain.  The patient also reports sensation of fullness and hearing loss.  The pain is located in the left ear.  The pain is described as intermittent.  The patient denies dizziness and vertigo.    Allergies: 1)  ! * Azithromycin 2)  ! Xanax 3)  ! * Lamictal 4)  ! Depakote 5)  ! * Tranxene 6)  ! * Trimetho/sulamethox 7)  ! * Carafate 8)  ! * Ketorolac 9)  ! * Metronidazole 10)  ! Cipro 11)  ! Benicar (Olmesartan Medoxomil) 12)  ! Doxycycline 13)  ! * Hyomax 14)  ! * Lomotil 15)  ! Percocet 16)  ! Hydrochlorothiazide 17)  ! * Bextra 18)  ! * Ultracet 19)  ! Prevacid 20)  ! Ativan 21)  ! * Ritalin 22)  ! * Quetiapine 23)  ! * Seroquel 24)  ! Prozac 25)  ! * Tivan 26)  ! Keflex 27)  ! * Hydroduril 28)  ! * Abilify 29)  ! * Pristiq 30)  ! * Remeron 31)  ! Imitrex 32)  ! Lortab 33)  ! * Verpamil 34)  ! Relafen 35)  ! Lasix 36)  ! Vioxx 37)  ! * Lovox 38)  ! * Claindent 39)  ! * Estratest 40)  ! Celebrex 41)  ! * Mobic 42)  ! Celexa 43)  ! Entex 44)  ! Aciphex 45)  ! Zestril 46)  ! Adalat 47)  ! * Provera 48)  ! Wellbutrin 49)  ! Effexor 50)  ! Paxil 51)  ! * Ketek 52)  ! Clonidine Hcl 53)  ! * Serzone 54)  ! * Protonix 55)  Topamax Sprinkle (Topiramate) 56)  Norvasc (Amlodipine Besylate) 57)  Zoloft (Sertraline Hcl) 58)  Cefaclor (Cefaclor) 59)  * Zithromycin  Past History:  Past Medical History: Anxiety/Depression  (?Bipolar) GERD Hypertension   ? TIA 2000    Osteoarthritis  IBS Segmental iscemic colitis 8/09       Past Surgical History: Bladder Repair 1995 Cholecystectomy 2004    Hernia Surgery 1995y Hysterectomy - partial 1992  Right Ankle fracture surgery 1999   Cataract Extraction - bilateral          Family History: No FH of Colon Cancer: Family History of Diabetes: mother, aunts, uncles Family History of Heart Disease: father, aunt         Social History: Occupation: retired Patient is a former smoker. -stopped 1962  Alcohol Use - no  Illicit Drug Use - no     Daily Caffeine Use  -1 cup daily  Physical Exam  General:  alert, well-developed, and well-nourished.   Ears:  R ear normal and L ear normal.   Lungs:  normal respiratory  effort and normal breath sounds.   Heart:  normal rate, regular rhythm, and no gallop.   Psych:  normally interactive and good eye contact.     Impression & Recommendations:  Problem # 1:  EUSTACHIAN TUBE DYSFUNCTION, LEFT (ICD-381.81) Pt with left ear fullness and mild hearing loss.  I suspect ETD.  use claritin and nasal steroids.  follow up with ENT for further eval  Orders: ENT Referral (ENT)  Complete Medication List: 1)  Spironolactone 25 Mg Tabs (Spironolactone) .... Take 1 tablet by mouth once a day 2)  Diovan 320 Mg Tabs (Valsartan) .... Take 1 tablet by mouth once a day 3)  Premarin 1.25 Mg Tabs (Estrogens conjugated) .... Take 1 tablet by mouth once a day 4)  Librium 5 Mg Caps (Chlordiazepoxide hcl) .... Take 3 tablets by mouth two times a day 5)  Fish Oil 1000 Mg Caps (Omega-3 fatty acids) .... Take 1 tablet every 6 hours 6)  Citrucel 500 Mg Tabs (Methylcellulose (laxative)) .... Take 1 tablet by mouth once daily 7)  Vitamin D 2000 Unit Tabs (Cholecalciferol) .... Take 1 tablet by mouth once a day 8)  Flexeril 10 Mg Tabs (Cyclobenzaprine hcl) .... Take 2 tablets by mouth once daily as needed 9)  Pepcid Ac 10 Mg Tabs (Famotidine)  .... Take as needed 10)  Multivitamins Tabs (Multiple vitamin) .... 2 tablets by mouth once daily 11)  Prilosec 20 Mg Cpdr (Omeprazole) .... Take 1/2 tablet by mouth as needed 12)  Prevalite 4 Gm Pack (Cholestyramine light) .Marland Kitchen.. 1 pack at bedtime may need to hold a dose if constipation occurs  Patient Instructions: 1)  Take claritin 10 mg OTC once daily. Prescriptions: DIOVAN 320 MG  TABS (VALSARTAN) Take 1 tablet by mouth once a day  #30 x 6   Entered by:   Glendell Docker CMA   Authorized by:   D. Thomos Lemons DO   Signed by:   Glendell Docker CMA on 12/17/2009   Method used:   Electronically to        [facility]* (retail)       [address] [place], [place]  [address]       Ph: [phone] or [phone]       Fax: [phone]   RxID:   [id number] FLUTICASONE PROPIONATE 50 MCG/ACT SUSP (FLUTICASONE PROPIONATE) 2 sprays each nostril once daily  #1 x 1   Entered and Authorized by:   D. Thomos Lemons DO   Signed by:   D. Thomos Lemons DO on 12/17/2009   Method used:   Electronically to        [facility]* (retail)       [address] [place], [place]  [address]       Ph: [phone] or [phone]       Fax: [phone]   RxID:   [id number]

## 2010-12-26 NOTE — Progress Notes (Signed)
Summary: Nose bleed  Phone Note Call from Patient   [name]: Patient Details for Reason: Allergies Summary of Call: pt seen last week for allergies   has been using   over the counter meds- not any better -,has had bloody muscus .  please call [phone] or  [phone]   told to call if no better Initial call taken by: Darral Dash,  May 06, 2010 5:08 PM  Follow-up for Phone Call        apply Ayr Gel (to inside of nose with Q tip) two times a day. ayr gel can be obtained over the counter  If nose bleeds get worse, I can arrange referral to ENT Follow-up by: D. Thomos Lemons DO,  May 06, 2010 5:43 PM  Additional Follow-up for Phone Call Additional follow up Details #1::        pt informed and will call back if sxs do not improve Additional Follow-up by: Margaret Pyle, CMA,  May 07, 2010 9:11 AM

## 2010-12-26 NOTE — Consult Note (Signed)
Summary: [facility]  [facility]   Imported By: Lanelle Bal 12/31/2009 12:51:22  _____________________________________________________________________  External Attachment:    Type:   Image     Comment:   External Document

## 2011-01-06 ENCOUNTER — Ambulatory Visit: Payer: Self-pay | Admitting: Internal Medicine

## 2011-01-09 ENCOUNTER — Encounter: Payer: Self-pay | Admitting: Internal Medicine

## 2011-01-09 ENCOUNTER — Ambulatory Visit (INDEPENDENT_AMBULATORY_CARE_PROVIDER_SITE_OTHER): Payer: Medicare Other | Admitting: Internal Medicine

## 2011-01-09 DIAGNOSIS — F341 Dysthymic disorder: Secondary | ICD-10-CM

## 2011-01-16 ENCOUNTER — Telehealth: Payer: Self-pay | Admitting: Internal Medicine

## 2011-01-16 ENCOUNTER — Encounter: Payer: Self-pay | Admitting: Internal Medicine

## 2011-01-21 ENCOUNTER — Telehealth: Payer: Self-pay | Admitting: Internal Medicine

## 2011-01-22 ENCOUNTER — Telehealth: Payer: Self-pay | Admitting: Internal Medicine

## 2011-01-23 ENCOUNTER — Encounter: Payer: Self-pay | Admitting: Internal Medicine

## 2011-01-30 NOTE — Progress Notes (Signed)
Summary: Prior Auth --Viibryd  Phone Note Outgoing Call   Call placed by: Darral Dash Call placed to: Insurer Details for Reason: Prior Auth  Viibryd Summary of Call: Prior Auth  Viibryd 10mg     form faxed   Initial call taken by: Darral Dash,  January 16, 2011 4:06 PM  Follow-up for Phone Call        Prior authorization form recived and forwarded to Dr Artist Pais for completion Follow-up by: Glendell Docker CMA,  January 17, 2011 8:46 AM  Additional Follow-up for Phone Call Additional follow up Details #1::        pt called stating that she is out of med. can she have samples until prior auth is completed? Additional Follow-up by: Elba Barman,  January 20, 2011 10:14 AM    Additional Follow-up for Phone Call Additional follow up Details #2::    Spoke to pt, she states that she has the starter pack that we gave her from the office and she is wanting to know if she can cut the 20mg  tabs in half. Advised pt to contact pharmacist re: cutting the tablet in half. Pt states the pharmacist told her it would be ok but she wanted to check with [place]. That will give pt a 2 week supply until determination can be reached by ins. Advised pt to call back if determination has not been reached and she will need additional supply. Nicki Guadalajara Fergerson CMA Duncan Dull)  January 20, 2011 3:17 PM   Additional Follow-up for Phone Call Additional follow up Details #3:: Details for Additional Follow-up Action Taken: she can have addt'l samples Additional Follow-up by: D. Thomos Lemons DO,  January 21, 2011 12:56 PM    call placed to patient at  [phone], she has been informed samples of Vibryd would be left at front desk for patient pick up. Glendell Docker CMA  January 22, 2011 8:30 AM

## 2011-01-30 NOTE — Progress Notes (Signed)
Summary: Vibryd Status  Phone Note Outgoing Call   Call placed by: Glendell Docker CMA,  January 22, 2011 3:50 PM Call placed to: Insurer Summary of Call: Prior  authorization for Vibryd faxed to [employer]  [phone]. Medications tried and failed Pristiq, Zoloft, Paxil, Effexor, Wellbutrin, Celexa- Awaiting approval/denial status Initial call taken by: Glendell Docker CMA,  January 22, 2011 3:52 PM  Follow-up for Phone Call        Received fax from [employer] that Viibryd 10mg  has been approved through 11/24/11. Notified Sarah at [employer]. Left detailed message on pt's phone re: approval status and to call if any questions. Nicki Guadalajara Fergerson CMA Duncan Dull)  January 23, 2011 2:54 PM

## 2011-01-30 NOTE — Assessment & Plan Note (Signed)
Summary: 2 month fu/ss   Vital Signs:  Patient profile:   77 year old female Height:      65.5 inches Weight:      178.25 pounds BMI:     29.32 O2 Sat:      100 % on Room air Temp:     97.5 degrees F oral Pulse rate:   70 / minute Resp:     18 per minute BP sitting:   110 / 80  (right arm) Cuff size:   large  Vitals Entered By: Glendell Docker CMA (January 09, 2011 3:13 PM)  O2 Flow:  Room air CC: 2 Month follow up  Is Patient Diabetic? No Pain Assessment Patient in pain? no      Comments follow up on medication, stopped taking medication on Feb 6th due to feeling drained, difficulty sleeping, crying spells, tremors, and depression, she states she is still uptight   Primary Care Provider:  D. Thomos Lemons DO  CC:  2 Month follow up .  History of Present Illness: 77 y/o with hx of depression / bipolar d/o for follow up  she could not tolerate lamictal depressive symtpoms worse - feels like crying all the time   Allergies: 1)  ! * Azithromycin 2)  ! Xanax 3)  ! * Lamictal 4)  ! Depakote 5)  ! * Tranxene 6)  ! * Trimetho/sulamethox 7)  ! * Carafate 8)  ! * Ketorolac 9)  ! * Metronidazole 10)  ! Cipro 11)  ! Benicar (Olmesartan Medoxomil) 12)  ! Doxycycline 13)  ! * Hyomax 14)  ! * Lomotil 15)  ! Percocet 16)  ! Hydrochlorothiazide 17)  ! * Bextra 18)  ! * Ultracet 19)  ! Prevacid 20)  ! Ativan 21)  ! * Ritalin 22)  ! * Quetiapine 23)  ! * Seroquel 24)  ! Prozac 25)  ! * Tivan 26)  ! Keflex 27)  ! * Hydroduril 28)  ! * Abilify 29)  ! * Pristiq 30)  ! * Remeron 31)  ! Imitrex 32)  ! Lortab 33)  ! * Verpamil 34)  ! Relafen 35)  ! Lasix 36)  ! Vioxx 37)  ! * Lovox 38)  ! * Claindent 39)  ! * Estratest 40)  ! Celebrex 41)  ! * Mobic 42)  ! Celexa 43)  ! Entex 44)  ! Aciphex 45)  ! Zestril 46)  ! Adalat 47)  ! * Provera 48)  ! Wellbutrin 49)  ! Effexor 50)  ! Paxil 51)  ! * Ketek 52)  ! Clonidine Hcl 53)  ! * Serzone 54)  ! *  Protonix 55)  Topamax Sprinkle (Topiramate) 56)  Norvasc (Amlodipine Besylate) 57)  Zoloft (Sertraline Hcl) 58)  Cefaclor (Cefaclor) 59)  * Zithromycin  Past History:  Past Medical History: Anxiety/Depression (?Bipolar) GERD   Hypertension      HL  ? TIA 2000     Osteoarthritis  IBS Segmental iscemic colitis 8/09     Past Surgical History: Bladder Repair 1995  Cholecystectomy 2004    Hernia Surgery 1995y   Hysterectomy - partial 1992   Right Ankle fracture surgery 1999    Cataract Extraction - bilateral           Family History: No FH of Colon Cancer: Family History of Diabetes: mother, aunts, uncles Family History of Heart Disease: father, aunt              Physical  Exam  General:  alert, well-developed, and well-nourished.   Lungs:  normal respiratory effort and normal breath sounds.  normal respiratory effort and normal breath sounds.   Heart:  normal rate, regular rhythm, and no gallop.     Impression & Recommendations:  Problem # 1:  DEPRESSION/ANXIETY (ICD-300.4) Assessment Deteriorated she could not tolerate lamictal trial of vibryd samples provided Patient advised to call office if depressive symptoms worsen.  Complete Medication List: 1)  Spironolactone 25 Mg Tabs (Spironolactone) .... Take 1 tablet by mouth once a day 2)  Diovan 320 Mg Tabs (Valsartan) .... Take 1 tablet by mouth once a day 3)  Fish Oil 1000 Mg Caps (Omega-3 fatty acids) .... Take 1 tablet every 6 hours 4)  Citrucel 500 Mg Tabs (Methylcellulose (laxative)) .... Take 1 tablet by mouth once daily 5)  Prevalite 4 Gm Pack (Cholestyramine light) .Marland Kitchen.. 1 pack at bedtime may need to hold a dose if constipation occurs 6)  Maalox Regular Strength 200-200-20 Mg/27ml Susp (Alum & mag hydroxide-simeth) .... As needed 7)  Amitriptyline Hcl 25 Mg Tabs (Amitriptyline hcl) .... 1/2 to one tab by mouth at bedtime as needed 8)  Cyclobenzaprine Hcl 5 Mg Tabs (Cyclobenzaprine hcl) .... One by mouth at  bedtime as needed 9)  Ranitidine Hcl 150 Mg Tabs (Ranitidine hcl) .... One by mouth two times a day as needed 10)  Viibryd 10 Mg Tabs (Vilazodone hcl) .... One by mouth once daily  Patient Instructions: 1)  Please schedule a follow-up appointment in 1 month. Prescriptions: VIIBRYD 10 MG TABS (VILAZODONE HCL) one by mouth once daily  #30 x 0   Entered and Authorized by:   D. Thomos Lemons DO   Signed by:   D. Thomos Lemons DO on 01/09/2011   Method used:   Print then Give to Patient   RxID:   [id number]    Orders Added: 1)  Est. Patient Level III [56213]     Current Allergies (reviewed today): ! * AZITHROMYCIN ! Prudy Feeler ! * LAMICTAL ! DEPAKOTE ! * TRANXENE ! * TRIMETHO/SULAMETHOX ! * CARAFATE ! * KETOROLAC ! * METRONIDAZOLE ! CIPRO ! BENICAR (OLMESARTAN MEDOXOMIL) ! DOXYCYCLINE ! * HYOMAX ! * LOMOTIL ! PERCOCET ! HYDROCHLOROTHIAZIDE ! * BEXTRA ! * ULTRACET ! PREVACID ! ATIVAN ! * RITALIN ! * QUETIAPINE ! * SEROQUEL ! PROZAC ! Isabel Caprice ! KEFLEX ! * HYDRODURIL ! * ABILIFY ! * PRISTIQ ! * REMERON ! IMITREX ! LORTAB ! * VERPAMIL ! RELAFEN ! LASIX ! VIOXX ! * LOVOX ! * CLAINDENT ! * ESTRATEST ! CELEBREX ! * MOBIC ! CELEXA ! ENTEX ! ACIPHEX ! ZESTRIL ! ADALAT ! * PROVERA ! WELLBUTRIN ! [facility] ! PAXIL ! * KETEK ! CLONIDINE HCL ! * SERZONE ! * PROTONIX TOPAMAX SPRINKLE (TOPIRAMATE) NORVASC (AMLODIPINE BESYLATE) ZOLOFT (SERTRALINE HCL) CEFACLOR (CEFACLOR) * ZITHROMYCIN

## 2011-01-30 NOTE — Progress Notes (Signed)
Summary: refill-cyclobenzaprine  Phone Note Refill Request Message from:  Fax from Pharmacy on January 21, 2011 10:01 AM  Refills Requested: Medication #1:  CYCLOBENZAPRINE HCL 5 MG TABS one by mouth at bedtime as needed   Dosage confirmed as above?Dosage Confirmed   Brand Name Necessary? No   Supply Requested: 1 month   Last Refilled: 07/30/2010 [facility] [address] [place], [place] [address] fax [phone]   Method Requested: Electronic Next Appointment Scheduled: 3.15.12 yoo Initial call taken by: Elba Barman,  January 21, 2011 10:02 AM  Follow-up for Phone Call        call placed to patient at [phone], patient requested refills on Flexiril for her headaches. She states the pharmacist informed her that she will need approval from the doctor. Follow-up by: Glendell Docker CMA,  January 21, 2011 11:00 AM  Additional Follow-up for Phone Call Additional follow up Details #1::        ok to refill x 1 Additional Follow-up by: D. Thomos Lemons DO,  January 21, 2011 1:16 PM    Additional Follow-up for Phone Call Additional follow up Details #2::    call placed to patient at [phone], no answer. A detailed voice message was left informing patient rx sent to pharmacy Follow-up by: Glendell Docker CMA,  January 21, 2011 2:18 PM  Prescriptions: CYCLOBENZAPRINE HCL 5 MG TABS (CYCLOBENZAPRINE HCL) one by mouth at bedtime as needed  #30 x 0   Entered by:   Glendell Docker CMA   Authorized by:   D. Thomos Lemons DO   Signed by:   Glendell Docker CMA on 01/21/2011   Method used:   Electronically to        [facility]* (retail)       [address] [place], [place]  [address]       Ph: [phone] or [phone]       Fax: [phone]   RxID:   [id number]

## 2011-02-04 NOTE — Medication Information (Signed)
Summary: Prior Authorization Request for Viibryd  Prior Authorization Request for Viibryd   Imported By: Maryln Gottron 01/27/2011 09:01:48  _____________________________________________________________________  External Attachment:    Type:   Image     Comment:   External Document

## 2011-02-04 NOTE — Medication Information (Signed)
Summary: Approval for Viibryd/[facility]  Approval for Viibryd/[facility]   Imported By: Lanelle Bal 01/30/2011 10:03:20  _____________________________________________________________________  External Attachment:    Type:   Image     Comment:   External Document

## 2011-02-06 ENCOUNTER — Ambulatory Visit (INDEPENDENT_AMBULATORY_CARE_PROVIDER_SITE_OTHER): Payer: Medicare Other | Admitting: Internal Medicine

## 2011-02-06 ENCOUNTER — Encounter: Payer: Self-pay | Admitting: Internal Medicine

## 2011-02-06 DIAGNOSIS — F341 Dysthymic disorder: Secondary | ICD-10-CM

## 2011-02-19 ENCOUNTER — Telehealth: Payer: Self-pay | Admitting: Internal Medicine

## 2011-02-19 DIAGNOSIS — F341 Dysthymic disorder: Secondary | ICD-10-CM

## 2011-02-19 NOTE — Telephone Encounter (Signed)
Refill- chlordiazepoxide hcl 10 mg cap. Take 1 capsule by mouth three times daily. Qty 90. Last fill 3.2.12

## 2011-02-20 MED ORDER — CHLORDIAZEPOXIDE HCL 10 MG PO CAPS: 10.0000 mg | ORAL_CAPSULE | Freq: Three times a day (TID) | ORAL | Status: AC

## 2011-02-20 NOTE — Telephone Encounter (Signed)
rx refill sent to [facility]

## 2011-02-20 NOTE — Telephone Encounter (Signed)
Ok to refill x 3 

## 2011-02-25 NOTE — Assessment & Plan Note (Signed)
Summary: 1 MONTH FOLLOW UP/MHF--rm 2   Vital Signs:  Patient profile:   77 year old female Height:      65.5 inches Weight:      179.75 pounds BMI:     29.56 O2 Sat:      97 % on Room air Temp:     97.9 degrees F oral Pulse rate:   71 / minute Pulse rhythm:   regular Resp:     18 per minute BP sitting:   130 / 60  (left arm) Cuff size:   large  Vitals Entered By: Mervin Kung CMA Duncan Dull) (February 06, 2011 2:51 PM)  O2 Flow:  Room air CC: Pt here for 1 month follow up. Is Patient Diabetic? No   Primary Care Provider:  Dondra Spry DO  CC:  Pt here for 1 month follow up.Marland Kitchen  History of Present Illness:  Pt states she is coming off of natural hormone replacements and she is having mood swings; not sure if Viibryd is causing the mood swings?.  Pt also states she is taking Chlordiazepoxide 10mg  three times a day. she notes significant improvement in mood within 1-2 weeks of starting viibryd  Preventive Screening-Counseling & Management  Alcohol-Tobacco     Smoking Status: quit     Year Quit: 1970  Allergies: 1)  ! * Azithromycin 2)  ! Xanax 3)  ! * Lamictal 4)  ! Depakote 5)  ! * Tranxene 6)  ! * Trimetho/sulamethox 7)  ! * Carafate 8)  ! * Ketorolac 9)  ! * Metronidazole 10)  ! Cipro 11)  ! Benicar (Olmesartan Medoxomil) 12)  ! Doxycycline 13)  ! * Hyomax 14)  ! * Lomotil 15)  ! Percocet 16)  ! Hydrochlorothiazide 17)  ! * Bextra 18)  ! * Ultracet 19)  ! Prevacid 20)  ! Ativan 21)  ! * Ritalin 22)  ! * Quetiapine 23)  ! * Seroquel 24)  ! Prozac 25)  ! * Tivan 26)  ! Keflex 27)  ! * Hydroduril 28)  ! * Abilify 29)  ! * Pristiq 30)  ! * Remeron 31)  ! Imitrex 32)  ! Lortab 33)  ! * Verpamil 34)  ! Relafen 35)  ! Lasix 36)  ! Vioxx 37)  ! * Lovox 38)  ! * Claindent 39)  ! * Estratest 40)  ! Celebrex 41)  ! * Mobic 42)  ! Celexa 43)  ! Entex 44)  ! Aciphex 45)  ! Zestril 46)  ! Adalat 47)  ! * Provera 48)  ! Wellbutrin 49)  ! Effexor 50)   ! Paxil 51)  ! * Ketek 52)  ! Clonidine Hcl 53)  ! * Serzone 54)  ! * Protonix 55)  Topamax Sprinkle (Topiramate) 56)  Norvasc (Amlodipine Besylate) 57)  Zoloft (Sertraline Hcl) 58)  Cefaclor (Cefaclor) 59)  * Zithromycin  Past History:  Past Medical History: Anxiety/Depression (?Bipolar) GERD   Hypertension      HL  ? TIA 2000     Osteoarthritis  IBS Segmental iscemic colitis 8/09      Family History: No FH of Colon Cancer: Family History of Diabetes: mother, aunts, uncles Family History of Heart Disease: father, aunt                Social History: Occupation: retired Patient is a former smoker. -stopped 3 Alcohol Use - no   Illicit Drug Use - no  Daily Caffeine Use  -1 cup daily       Physical Exam  General:  alert, well-developed, and well-nourished.   Lungs:  normal respiratory effort and normal breath sounds.  normal respiratory effort and normal breath sounds.   Heart:  normal rate, regular rhythm, and no gallop.   Psych:  normally interactive, good eye contact, not anxious appearing, and not depressed appearing.     Impression & Recommendations:  Problem # 1:  DEPRESSION/ANXIETY (ICD-300.4) Assessment Improved Pt having issues coming off hormone replacement. I suggest pt titrate viibryd to 20 mg  Complete Medication List: 1)  Spironolactone 25 Mg Tabs (Spironolactone) .... Take 1 tablet by mouth once a day 2)  Diovan 320 Mg Tabs (Valsartan) .... Take 1 tablet by mouth once a day 3)  Fish Oil 1000 Mg Caps (Omega-3 fatty acids) .... Take 1 tablet every 6 hours 4)  Citrucel 500 Mg Tabs (Methylcellulose (laxative)) .... Take 1 tablet by mouth once daily 5)  Prevalite 4 Gm Pack (Cholestyramine light) .Marland Kitchen.. 1 pack at bedtime may need to hold a dose if constipation occurs 6)  Maalox Regular Strength 200-200-20 Mg/11ml Susp (Alum & mag hydroxide-simeth) .... As needed 7)  Amitriptyline Hcl 25 Mg Tabs (Amitriptyline hcl) .... 1/2 to one tab by mouth at  bedtime as needed 8)  Cyclobenzaprine Hcl 5 Mg Tabs (Cyclobenzaprine hcl) .... One by mouth at bedtime as needed 9)  Ranitidine Hcl 150 Mg Tabs (Ranitidine hcl) .... One by mouth two times a day as needed 10)  Viibryd 20 Mg Tabs (Vilazodone hcl) .... One by mouth qd  Patient Instructions: 1)  Please schedule a follow-up appointment in 2 months. Prescriptions: VIIBRYD 20 MG TABS (VILAZODONE HCL) one by mouth qd  #30 x 1   Entered and Authorized by:   D. Thomos Lemons DO   Signed by:   D. Thomos Lemons DO on 02/06/2011   Method used:   Electronically to        [facility]* (retail)       [address] [place], [place]  [address]       Ph: [phone] or [phone]       Fax: [phone]   RxID:   [id number]    Orders Added: 1)  Est. Patient Level III [69678]     Current Allergies (reviewed today): ! * AZITHROMYCIN ! Prudy Feeler ! * LAMICTAL ! DEPAKOTE ! * TRANXENE ! * TRIMETHO/SULAMETHOX ! * CARAFATE ! * KETOROLAC ! * METRONIDAZOLE ! CIPRO ! BENICAR (OLMESARTAN MEDOXOMIL) ! DOXYCYCLINE ! * HYOMAX ! * LOMOTIL ! PERCOCET ! HYDROCHLOROTHIAZIDE ! * BEXTRA ! * ULTRACET ! PREVACID ! ATIVAN ! * RITALIN ! * QUETIAPINE ! * SEROQUEL ! PROZAC ! Isabel Caprice ! KEFLEX ! * HYDRODURIL ! * ABILIFY ! * PRISTIQ ! * REMERON ! IMITREX ! LORTAB ! * VERPAMIL ! RELAFEN ! LASIX ! VIOXX ! * LOVOX ! * CLAINDENT ! * ESTRATEST ! CELEBREX ! * MOBIC ! CELEXA ! ENTEX ! ACIPHEX ! ZESTRIL ! ADALAT ! * PROVERA ! WELLBUTRIN ! [facility] ! PAXIL ! * KETEK ! CLONIDINE HCL ! * SERZONE ! * PROTONIX TOPAMAX SPRINKLE (TOPIRAMATE) NORVASC (AMLODIPINE BESYLATE) ZOLOFT (SERTRALINE HCL) CEFACLOR (CEFACLOR) * ZITHROMYCIN

## 2011-03-26 ENCOUNTER — Encounter: Payer: Self-pay | Admitting: Internal Medicine

## 2011-03-27 ENCOUNTER — Ambulatory Visit (INDEPENDENT_AMBULATORY_CARE_PROVIDER_SITE_OTHER): Payer: Medicare Other | Admitting: Internal Medicine

## 2011-03-27 ENCOUNTER — Encounter: Payer: Self-pay | Admitting: Internal Medicine

## 2011-03-27 DIAGNOSIS — F341 Dysthymic disorder: Secondary | ICD-10-CM

## 2011-03-27 DIAGNOSIS — R29898 Other symptoms and signs involving the musculoskeletal system: Secondary | ICD-10-CM

## 2011-03-27 MED ORDER — CHLORDIAZEPOXIDE HCL 10 MG PO CAPS: 10.0000 mg | ORAL_CAPSULE | Freq: Four times a day (QID) | ORAL | Status: DC | PRN

## 2011-03-27 MED ORDER — GABAPENTIN 100 MG PO CAPS: ORAL_CAPSULE | ORAL | Status: DC

## 2011-03-27 NOTE — Progress Notes (Signed)
Subjective:    Patient ID: Cynthia Gilmore, female    DOB: [birth year], 77 y.o.   MRN: [id number]  HPI 77 y/o female for follow up re:  Depression and anxiety.  Pt was unable to tolerate viibryd. She c/o she can't sleep at night.  Legs are twitchy.  Pt also notes poor short memory problems.   Sleeping one hr night.  She has changed dose of librium  Review of Systems    no chest pains.  No abd pain Past Medical History  Diagnosis Date  . Anxiety and depression     ? bipolar  . GERD (gastroesophageal reflux disease)   . Hypertension   . TIA (transient ischemic attack) 2000  . Osteoarthritis (arthritis due to wear and tear of joints)   . IBS (irritable bowel syndrome)   . Segmental colitis     iscemic colitis 2009    History   Social History  . Marital Status: Married    Spouse Name: N/A    Number of Children: N/A  . Years of Education: N/A   Occupational History  . Not on file.   Social History Main Topics  . Smoking status: Former Games developer  . Smokeless tobacco: Not on file   Comment: stopped in 1962  . Alcohol Use: Not on file  . Drug Use: Not on file  . Sexually Active: Not on file   Other Topics Concern  . Not on file   Social History Narrative   Occupation: retiredPatient is a former smoker. -stopped 1962Alcohol Use - no  Illicit Drug Use - no      Daily Caffeine Use  -1 cup daily         Past Surgical History  Procedure Date  . Bladder repair 1995  . Cholecystectomy 2004  . Hernia repair 1995  . Total abdominal hysterectomy 1992    partial  . Ankle surgery 1999    right ankle fracture surgery  . Cataract extraction, bilateral     Family History  Problem Relation Age of Onset  . Diabetes Mother   . Diabetes      aunts, uncles  . Heart disease Father     aunt    Allergies  Allergen Reactions  . Alprazolam     REACTION: sleepy  . Amlodipine Besylate     REACTION: swelling  . Aripiprazole   . Azithromycin   . Bupropion Hcl     REACTION:  insomnia  . Cefaclor     REACTION: mouth blisters  . Celecoxib     REACTION: "drained"  . Cephalexin   . Ciprofloxacin   . Citalopram Hydrobromide     REACTION: memory loss  . Clonidine Hydrochloride     REACTION: headache, dizziness  . Desvenlafaxine   . Divalproex Sodium     REACTION: nausea,tremors,fatigue  . Doxycycline   . Entex     REACTION: nausea, headache  . Estratest     REACTION: headache  . Fluoxetine Hcl   . Furosemide   . Hydrochlorothiazide   . Hydrocodone-Acetaminophen     REACTION: headache  . Lamotrigine     REACTION: mood swing,chills, nausea  . Lansoprazole   . Lisinopril     REACTION: cough  . Lomotil   . Lorazepam   . Meloxicam     REACTION: nausea, diarrhea  . Methylphenidate Hcl   . Metronidazole   . Mirtazapine     REACTION: swelling  . Nabumetone     REACTION: nausea, headache  .  Olmesartan Medoxomil   . Oxycodone-Acetaminophen   . Pantoprazole Sodium   . Paroxetine     REACTION: tremor, insomnia, nausea  . Quetiapine   . Rabeprazole Sodium     REACTION: diarrhea  . Rofecoxib   . Sertraline Hcl     REACTION: exreme confusion  . Sucralfate   . Sumatriptan     REACTION: cardiologist says "no"  . Telithromycin     REACTION: "drained"  . Tramadol-Acetaminophen   . Venlafaxine     REACTION: hypertension, nausea, headache  . Vilazodone Hcl (Viibryd)     Current Outpatient Prescriptions on File Prior to Visit  Medication Sig Dispense Refill  . cyclobenzaprine (FLEXERIL) 5 MG tablet Take 5 mg by mouth at bedtime as needed.        . Methylcellulose, Laxative, (CITRUCEL) 500 MG TABS Take 500 mg by mouth daily.        . Omega-3 Fatty Acids (FISH OIL) 1000 MG CAPS Take 1,000 mg by mouth 2 (two) times daily.        . ranitidine (ZANTAC) 150 MG tablet Take 150 mg by mouth 2 (two) times daily.        . valsartan (DIOVAN) 320 MG tablet Take 320 mg by mouth daily.          BP 132/68  Pulse 61  Temp(Src) 97.8 F (36.6 C) (Oral)  Resp  18  Wt 178 lb (80.74 kg)  SpO2 100%    Objective:   Physical Exam     Constitutional: Appears well-developed and well-nourished. No distress.  Head: Normocephalic and atraumatic.  Neck: Normal range of motion. Neck supple. No thyromegaly present. No carotid bruit Cardiovascular: Normal rate, regular rhythm and normal heart sounds.  No murmur heard. Pulmonary/Chest: Effort normal and breath sounds normal.  No wheezes. No rales.  Abdominal: Soft. Bowel sounds are normal. No mass. There is no tenderness.  Neurological: Alert. No cranial nerve deficit.  Skin: Skin is warm and dry.  Psychiatric: Normal mood and affect. Behavior is normal.    Assessment & Plan:

## 2011-03-27 NOTE — Patient Instructions (Addendum)
Take 1/2 of 20 mg Vibryd x 1 week, then take 5 mg x 1 week, then 5 mg every other day x 1 week. Do not take extra dose of librium if you are taking gabapentin.  Do not take both medications as same time.

## 2011-04-08 NOTE — Discharge Summary (Signed)
NAME:  Cynthia Gilmore, Cynthia Gilmore NO.:  [id number]   MEDICAL RECORD NO.:  [id number]          PATIENT TYPE:  INP   LOCATION:  1534                         FACILITY[facility]  [facility]   PHYSICIAN:  Raenette Rover. Felicity Coyer, MDDATE OF BIRTH:  January 21, 1934   DATE OF ADMISSION:  07/10/2008  DATE OF DISCHARGE:  07/12/2008                               DISCHARGE SUMMARY   PRIMARY CARE PHYSICIAN:  Dr. Thomos Lemons.   GASTROENTEROLOGIST:  Dr. Stan Head.   DISCHARGE DIAGNOSES:  1. Abdominal pain and melena in setting of mild ischemic colitis.  2. Irritable bowel syndrome.   HISTORY OF PRESENT ILLNESS:  Cynthia Gilmore is a very pleasant 77 year old  female with past medical history of hypertension and irritable bowel  syndrome who presented to [facility] Emergency Room on day of admission  with reports of 24 hours of cramping left lower quadrant abdominal pain  with bright red blood and dark stools.  Emergency room evaluation found  slightly elevated white count of 12.1 with no neutrophilic shift.  The  patient's H&H were within normal range.  A CT scan of the abdomen and  pelvis was obtained due to patient's symptoms revealing generalized  bowel wall thickening with pericolonic inflammation in the descending  colon consistent with colitis.  Of note patient was treated empirically  in the emergency room with IV Cipro and Flagyl.  She was admitted to our  service for further evaluation and treatment.   PAST MEDICAL HISTORY:  1. Hypertension.  2. Irritable bowel syndrome.  3. Depression.  4. Status post cholecystectomy 2005.   CONSULTATIONS DURING THIS ADMISSION:  [facility].   COURSE OF HOSPITALIZATION:  Abdominal pain with melena in setting of  mild ischemic colitis.  CT abdomen and pelvis results as listed in HPI.  The patient was seen in consultation by gastroenterology who recommended  increasing diet slowly and observing clinically.  Per Dr. Christella Hartigan'  consultation he felt the  patient's mild ischemic colitis likely  secondary to long stay outside while at [facility] last week in very hot  conditions likely causing dehydration and hypotension leading to  ischemic hit to the colon.  Patient has been without recurrent diarrhea  or melena since time of admission.  She denies abdominal pain at time of  dictation.  In regards to patient's irritable bowel syndrome GI has  recommended that patient start daily fiber supplement such as Citrucel.   MEDICATIONS AT TIME OF DISCHARGE:  1. Citrucel fiber supplement p.o. t.i.d.  2. Premarin 1.25 mg p.o. daily.  3. Fish oil 1000 mg 2 capsules b.i.d.  4. Aldactone 25 mg p.o. daily.  5. Diovan 320 mg p.o. daily.  6. Prilosec 20 mg p.o. daily.  7. Restoril 15 mg p.o. p.r.n.   PERTINENT LABORATORY WORK AT DISCHARGE:  White cell count 8.0, platelet  count 202, hemoglobin 13.0, hematocrit 38.9.  Sodium 135, potassium 3.7,  BUN 7, creatinine 0.58.   Chest x-ray done at time of admission with no active cardiopulmonary  disease.  CT abdomen and pelvis results as mentioned above.   DISPOSITION:  The patient felt medically stable for  discharge home at  this time.  She is instructed to follow up with Dr. Leone Payor as needed  for any GI issues.  The patient also instructed to keep appointment with  her primary care physician, Dr. Thomos Lemons, on September 1 as previously  scheduled.      Cynthia Pen, NP      Raenette Rover. Felicity Coyer, MD  Electronically Signed    LE/MEDQ  D:  07/12/2008  T:  07/12/2008  Job:  [id number]   cc:   Barbette Hair. Luther, DO  [address] [place], [place] [address]   Iva Boop, MD,FACG  [facility]  [address] [place], [place] [address]

## 2011-04-08 NOTE — Letter (Signed)
September 08, 2008    Barbette Hair. Artist Pais, DO  [address], [address]  [place], [place] [address]   RE:  Cynthia, Gilmore  MRN:  [id number]  /  DOB:  1934-10-10   Dear Dr. Artist Pais:   It was my pleasure to see your patient, Cynthia Gilmore in cardiology  consultation today.  As you recall, she is a very pleasant 77 year old  female with a history of profound depression and hypertension who has  recently developed symptomatic dizziness.  The patient tells me that  she was in her usual state of health which includes chronic depression  with anxiety until August 2009.  She was initiated on amitriptyline.  She reports that her symptoms of depression significantly improved with  this therapy.  Unfortunately, she developed dizzy spells.  She reports  that the first episode occurred on August 14, 2008, while walking.  The patient reports that her head felt funny, and she found herself  staggering with an altered gait.  She found herself walking towards the  left.  She denies any other neurologic symptoms at that time including  headache, slurred speech, numbness, weakness, or other.  Her symptoms  only lasted several seconds before resolving.  She had 2 separate  episodes over the remainder of the day.  When her symptoms continued to  occur over the next few days, she subsequently stopped her amitriptyline  on August 24, 2008, thinking that this might be the culprit.  Upon being  evaluated in your office, an initial workup was unremarkable.  Since  that time, she has had multiple episodes of mild dizziness,  these  episodes last 1-2 seconds but have increased in frequency.  She is  unable to completely articulate the quality of these symptoms, but  denies significant palpitations, presyncope, or syncope.  She denies  chest pain, shortness of breath, or other neurologic symptoms.  The  patient reports chronically having an increased temper with easy  agitation.  During episodes of  agitation, she does note sinus  tachycardia but denies any other cardiac symptoms.   PAST MEDICAL HISTORY:  1. Depression.  2. Hypertension.  3. Anxiety.  4. Irritable bowel syndrome.  5. Fevers as a child.  6. GERD.  7. Degenerative joint disease.  8. Small vessel cerebrovascular disease, diagnosed in 2004.  9. Hospitalized in August for colitis.   SURGICAL HISTORY:  1. Status post total abdominal hysterectomy in 1972.  2. Bladder tack in 1995.  3. Hernia repair in 1995.  4. Bilateral cataract surgery in 2005.  5. Cholecystectomy in 2004.  6. ORIF of the right ankle in 1999.   CURRENT MEDICATIONS:  1. Diovan 320 mg daily.  2. Aldactone 25 mg daily.  3. Premarin 1.25 mg daily.  4. Flexeril 10 mg b.i.d.  5. Advil p.r.n.  6. Citrucel daily.  7. Darvocet p.r.n.  8. Restoril 15 mg p.r.n.  9. Librium 5 mg daily.  10.Amitriptyline 25 mg nightly (currently on hold).  11.Excedrin p.r.n.  12.Vitamin C 500 mg daily.  13.Vitamin D3 2000 International Units daily.  14.Fish oil 1200 mg q.i.d.  15.Prilosec 20 mg daily.  16.Multivitamin daily.   ALLERGIES:  The patient is unaware of any significant allergies;  however, she has 2 pages of medicine intolerances which are included in  her medical record as you are aware.   SOCIAL HISTORY:  The patient lives near [place].  She is retired.  She quit tobacco in 1964.  She denies  alcohol or drug use.   FAMILY HISTORY:  Diabetes.  The patient's father died of myocardial  infarction at age 33.   REVIEW OF SYSTEMS:  All systems are negative except as outlined in the  HPI above.   PHYSICAL EXAMINATION:  VITAL SIGNS:  Blood pressure 140/78, heart rate  76, respirations 18, weight 178 pounds.  The patient is not orthostatic  today.  GENERAL:  The patient is an obese female in no acute distress.  She is  alert, mildly anxious, and in no acute distress.  HEENT:  Normocephalic, atraumatic.  Sclerae clear.  Conjunctivae pink.   Oropharynx clear.  NECK:  Supple.  No JVD, lymphadenopathy, or bruits.  LUNGS:  Clear to auscultation bilaterally.  HEART:  Regular rate and rhythm.  No murmurs, rubs, or gallops.  GI:  Soft, nontender, nondistended.  Positive bowel sounds.  EXTREMITIES:  No clubbing, cyanosis, or edema.  NEUROLOGIC:  Cranial nerves II through XII are intact.  Strength and  sensation are intact.  SKIN:  No ecchymosis or laceration.  MUSCULOSKELETAL:  No deformity or atrophy.  PSYCH:  Depressed mood, anxious affect.   EKG reveals sinus arrhythmia with an average heart rate of 75 beats per  minute.  A right bundle-branch block is noted.  No other acute changes  are observed.  The patient had an episode of dizziness which was  completely like her prior episodes, and during this episode, she was  observed to have sinus tachycardia at 105 beats per minute with a right  bundle-branch block with no other EKG changes.   IMPRESSION:  Ms. Cynthia Gilmore is a 77 year old female with a history of  depression, hypertension, and hyperlipidemia who presents with symptoms  of dizziness.  The etiology of these episodes are not completely clear.  The patient previously attributed her symptoms to amitriptyline;  however, despite cessation of this medicine, they persist.  The patient  was kind enough to have an episode in our office today and we were able  to document her rhythm during an episode of dizziness.  Her rhythm is  sinus tachycardia at 105 beats per minute with no other EKG changes.  I,  therefore, think that this patient's symptoms are not related to an  arrhythmia.  Her sinus tachycardia reflects they have been secondary to  the primary event whatever that might be which causes her dizziness.  She is not orthostatic today.   PLAN:  At the present time, I do not think that a Holter or an event  monitor would be of value as I have been able to capture an episode  today on EKG and the patient only has a reactive  sinus tachycardia at  105 beats per minute during an episode.  I, therefore, did not think  that an arrhythmia can explain her symptoms.  We will obtain an  echocardiogram to evaluate for any structural causes for her dizziness,  though I think this would be certainly unlikely.  I have encouraged the  patient to stop caffeine and Excedrin.  I also think that she might  benefit from oral hydration on a daily basis.  I do not think that  further cardiac evaluation is warranted at this time.  Given that these  episodes occur very quickly and are associated with the sinus  tachycardia, I wonder if she might have an excess of cortisol or  catecholamines as a cause.  I do think this is unlikely, but I think it  would be reasonable  to obtain both serum cortisol and urine  catecholamine levels.  As the patient lives rather far from our my  office, she wishes to have this performed through your office.  This may  be something that you consider in the future.   I appreciate the opportunity to participate in the care of Ms. Maclin.  Please feel free to give me a call if you wish to discuss this further.    Sincerely,      Hillis Range, MD  Electronically Signed    JA/MedQ  DD: 09/08/2008  DT: 09/09/2008  Job #: [id number]

## 2011-04-08 NOTE — H&P (Signed)
NAME:  Cynthia Gilmore, ALL NO.:  [id number]   MEDICAL RECORD NO.:  [id number]          PATIENT TYPE:  EMS   LOCATION:  ED                           FACILITY:  [facility]   PHYSICIAN:  Hollice Espy, M.D.DATE OF BIRTH:  [birth year]   DATE OF ADMISSION:  07/10/2008  DATE OF DISCHARGE:                              HISTORY & PHYSICAL   PRIMARY CARE PHYSICIAN:  Barbette Hair. Artist Pais, DO.   CONSULTANTWilhemina Bonito. Marina Goodell, MD, [facility].   CHIEF COMPLAINT:  Abdominal pain and bloody stools.   HISTORY OF PRESENT ILLNESS:  The patient is a 77 year old white female  with a past medical history of hypertension and irritable bowel  syndrome, who approximately in the last 24 hours started complaining of  cramping lower quadrant abdominal pain and started noticing some blood  in stools, initially described as bright red, but then started becoming  maroon-like in nature and darker.  She became concerned and came into  the emergency room for further evaluation.  In the emergency room she  was noted to have a normal blood pressure and heart rate however, her  labs were drawn and she was found to have a slightly elevated white  count of 12.1, but with no shift.  Her hemoglobin/hematocrit were within  normal range.  A CT scan of the abdomen and pelvis noted a generalized  bowel wall thickening and pericolonic information affecting the  descending colon, consistent with a long segment colitis.  The patient,  herself, was given medication for pain and nausea and starting to feel a  little bit better.  She was given a dose of antibiotics in the emergency  room.  She otherwise is doing okay.  She denies any headaches, vision  changes, dysphagia, no chest pain, palpitations, shortness of breath,  wheeze, cough.  Denies any hematuria or dysuria.  No constipation.  She  has had diarrhea she says for years.  No focal extremity numbness,  weakness or pain.   REVIEW OF SYSTEMS:  Otherwise negative.   PAST MEDICAL HISTORY:  Includes hypertension, what sounds to be  irritable bowel syndrome, depression.   MEDICATIONS:  She is on Advil, Aldactone, Darvocet, Diovan, Excedrin,  fish well, flax seed oil, Flexeril,  multivitamins, Premarin, Prilosec,  Restoril, and she is about to be started on when she thinks is Antivert  soon.  She gives multiple,  multiple medication allergies.  However,  when I spoke to the ER doctor about every one of these medications, none  of these cause a rash or sob baseline when these medications none of  these cause rash room for shortness of breath and now apparently caused  the onset of GI upset or make her feel shaky and these are more  considered to be side effects.  She denies any tobacco, alcohol or drug  use.   FAMILY HISTORY:  Noncontributory.   PHYSICAL EXAMINATION:  VITALS SIGNS:  On admission temperature 97.9,  heart rate 96 now down to 70, blood pressure 156/95, now down to 142/81,  respirations 20, O2 sat 98% on room air.  GENERAL:  She  is alert and oriented.  HEENT:  Normocephalic, atraumatic.  Her mucous membranes are slightly  dry.  She has no carotid bruits.  HEART:  Regular rate and rhythm S1 and S2.  Soft 2/6, what sounds to be  a diastolic murmur, benign.  LUNGS:  Clear to auscultation bilaterally.  ABDOMEN:  Soft, nontender, nondistended.  Positive bowel sounds.  Currently no tenderness.  EXTREMITIES:  Show no clubbing, cyanosis, trace pitting edema.   LAB WORK:  Coags are unremarkable.  Sodium was 139, potassium 3.9,  chloride 107, bicarb 23,  BUN 9, creatinine 0.6, glucose 111.  LFTs are  noted for albumin an of 3.3.  UA notes moderate hemoglobin, but only 3-6  red cells.  Hemoccult positive.  White count 12.1.  H and H 14.6 and 44,  MCV of 89, platelet count 247, no neutrophils.  Lipase level is normal.   ASSESSMENT AND PLAN:  1. Nonspecific colitis, likely ischemic.  I have discussed this with      Dr. Marina Goodell of [facility].  Will  plan to put the      patient on clear liquids, supportive measures, intravenous hydrate      and he will come see the patient in the morning.  2. Hypertension.  Currently holding p.o. meds.  If her blood pressure      starts to elevate, will treat with p.r.n. Lopressor.  3. History of irritable bowel syndrome, currently stable.      Hollice Espy, M.D.  Electronically Signed     SKK/MEDQ  D:  07/10/2008  T:  07/11/2008  Job:  [id number]   cc:   Wilhemina Bonito. Marina Goodell, MD  [address]  [place]  [place] [address]   Iva Boop, MD,FACG  [facility]  [address] [place], [place] [address]   Barbette Hair. [place], DO  [address] [place], [place] [address]

## 2011-04-11 NOTE — Op Note (Signed)
NAME:  Cynthia Gilmore, Cynthia Gilmore NO.:  [id number]   MEDICAL RECORD NO.:  [id number]                   PATIENT TYPE:  OIB   LOCATION:  5733                                 FACILITY:  [facility]   PHYSICIAN:  Adolph Pollack, M.D.            DATE OF BIRTH:  [birth year]   DATE OF PROCEDURE:  06/14/2004  DATE OF DISCHARGE:  06/15/2004                                 OPERATIVE REPORT   PREOPERATIVE DIAGNOSIS:  Symptomatic cholelithiasis.   POSTOPERATIVE DIAGNOSIS:  Symptomatic cholelithiasis.   OPERATION PERFORMED:  Laparoscopic cholecystectomy with intraoperative  cholangiogram.   SURGEON:  Adolph Pollack, M.D.   ASSISTANT:  Jimmye Norman, M.D.   ANESTHESIA:  General.   INDICATIONS FOR PROCEDURE:  The patient is a 77 year old female with a  hiatal hernia and reflux, who takes a proton pump inhibitor.  She has been  having some postprandial epigastric pain and nausea that is not responding  to her antireflux measures.  Abdominal ultrasound demonstrated gallstone and  normal sized common bile duct and she now presents for cholecystectomy.  The  procedure and the risks were explained to her preoperatively.   DESCRIPTION OF PROCEDURE:  She was seen in the holding area and brought to  the operating room and placed supine on the operating table and a general  anesthetic was administered.  The abdominal wall was sterilely prepped and  draped.  Dilute Marcaine was infiltrated in the subumbilical region and a  small subumbilical incision was made through the skin and subcutaneous  tissue in midline fashion.  The peritoneal cavity was entered sharply and  under direct vision.  The pursestring suture of 0 Vicryl was placed around  the fascial edges.  A Hasson trocar was introduced into the peritoneal  cavity and the pneumoperitoneum was created by insufflation of CO2 gas.   Next the laparoscope was introduced and she was placed in reversed  Trendelenburg position with  the right side tilted slightly upward.  Under  direct vision, an 11 mm trocar was placed through an epigastric incision and  two 5 mm trocars placed in the right midabdomen.  The fundus of the  gallbladder was grasped and no acute inflammatory changes were noted.  No  significant adhesions were noted.  The fundus was retracted to the right  shoulder.  The infundibulum was grasped and mobilized using blunt dissection  and select cautery.  The cystic duct was then isolated and a window created  around it. A clip was placed at the cystic duct gallbladder junction.  A  small incision was made in the cystic duct.  A cholangiocatheter was passed  through the anterior abdominal wall, placed into the cystic duct and a  cholangiogram was performed.   Under real time fluoroscopy, dilute contrast material was injected into the  cystic duct which was of moderate length.  The common hepatic, right and  left hepatic and common bile ducts were  all opacified rapidly.  Contrast  spilled from the common bile duct into the duodenum without obvious evidence  of obstruction.  Final reports pending the radiologist's interpretation.   The cholangiocatheter was then removed, the cystic duct was clipped three  times on the staying in side and then divided.  The cystic artery was  identified, clipped and divided.  I then began dissecting the gallbladder  free from the liver bed using electrocautery.  I identified what appeared to  be an accessory duct of Luschka and I clipped this as well.  A small  perforation was made in the gallbladder but only bile spilled and no stones.  Once the gallbladder was freed from the liver bed, it was placed in an  EndoPouch bag.  I then copiously irrigated out the gallbladder fossa,  perihepatic area with saline solution.  Bleeding points in the gallbladder  fossa were controlled with electrocautery.  I inspected the gallbladder  fossa once again and noted no bile leak or bleeding.   I evacuated as much of  the irrigation fluid as possible.   The gallbladder was then removed in an EndoPouch bag through the  subumbilical port and the subumbilical fascial defect closed under  laparoscopic vision by tightening up and tying down the pursestring suture.  The remaining trocars were removed and a pneumoperitoneum was released.  The  skin incisions were closed with 4-0 Monocryl subcuticular stitches.  Steri-  Strips and sterile dressings were applied.  The patient tolerated the  procedure well without any apparent complications and was taken to recovery  room in satisfactory condition.                                               Adolph Pollack, M.D.    Kari Baars  D:  06/14/2004  T:  06/15/2004  Job:  [id number]   cc:   Angelena Sole, M.D. [facility]   Ulyess Mort, M.D. [facility]

## 2011-04-28 ENCOUNTER — Ambulatory Visit: Payer: Medicare Other | Admitting: Internal Medicine

## 2011-04-28 ENCOUNTER — Ambulatory Visit (INDEPENDENT_AMBULATORY_CARE_PROVIDER_SITE_OTHER): Payer: Medicare Other | Admitting: Family Medicine

## 2011-04-28 ENCOUNTER — Encounter: Payer: Self-pay | Admitting: Family Medicine

## 2011-04-28 VITALS — BP 134/80 | HR 68 | Temp 98.0°F | Resp 18 | Wt 181.0 lb

## 2011-04-28 DIAGNOSIS — F341 Dysthymic disorder: Secondary | ICD-10-CM

## 2011-04-28 NOTE — Progress Notes (Signed)
OFFICE NOTE  04/28/2011  CC:  Chief Complaint  Patient presents with  . Medication Problem    weaning off Vibryd last dose 5/20/201-discuss Librium     HPI:   Patient is a 77 y.o. Caucasian female who is here for depression f/u. Unfortunately she couldn't tolerate viibryd, says it made her feel off balance and tremulous.  She was titrated up slowly over a few months and still had these problems, plus says depression was no better. Says she feels the worst when she gets up in a.m., cries on/off through the day, feels a bit better towards the evenings.  Husband is with her today, says nothing much has changed. She asks if increasing her librium would help: she currently takes 1 tab four times per day and finds it helpful for anxiety.  Reviewed past depression meds that she has either failed or been intolerant of and she seems to not have any left to try except the older tricyclics.   She is not wanting to try any new meds at this time.  Says she has tried psychologist/counselor multiple times in the past and has never gone more than 2 sessions b/c she couldn't see that it helped any.   Sleep is a bit improved since starting neurontin 100mg  qhs. Of note, she had been on hormone replacement therapy for many years and then stopped them relatively recently.  Has f/u visit with her OB/GYN 06/2011.  Pertinent PMH:  Depression Anxiety HTN Insomnia GERD  MEDS;   Outpatient Prescriptions Prior to Visit  Medication Sig Dispense Refill  . chlordiazePOXIDE (LIBRIUM) 10 MG capsule Take 1 capsule (10 mg total) by mouth 4 (four) times daily as needed for anxiety.  120 capsule  1  . cyclobenzaprine (FLEXERIL) 5 MG tablet Take 5 mg by mouth at bedtime as needed.        . gabapentin (NEURONTIN) 100 MG capsule Take one to two caps at night as directed  60 capsule  1  . Methylcellulose, Laxative, (CITRUCEL) 500 MG TABS Take 500 mg by mouth daily.        . Omega-3 Fatty Acids (FISH OIL) 1000 MG CAPS Take  1,000 mg by mouth 2 (two) times daily.        . ranitidine (ZANTAC) 150 MG tablet Take 150 mg by mouth 2 (two) times daily.        Marland Kitchen spironolactone (ALDACTONE) 25 MG tablet Take 25 mg by mouth daily.        . valsartan (DIOVAN) 320 MG tablet Take 320 mg by mouth daily.          PE: Blood pressure 134/80, pulse 68, temperature 98 F (36.7 C), temperature source Oral, resp. rate 18, weight 181 lb (82.101 kg), SpO2 97.00%. Gen: Alert, well appearing.  Patient is oriented to person, place, time, and situation. Affect is pleasant.  Thought and speech are lucid. No further exam today.  IMPRESSION AND PLAN:  DEPRESSION/ANXIETY Her depression is ongoing; long history of intolerance to MANY, MANY meds. Anxiety well controlled with qid librium. Spent 25 min with patient today, with 50% of this time spent in counseling regarding her dep/anx and discussing treatment options.  Encouraged patient to focus on nonmedication therapies, strongly consider giving therapy another try and for longer period of time.  Continue librium, continue neurontin 156m qhs (although I encouraged her to try 200mg  dose qhs), and she was given the contact info for behavioral health locally.     FOLLOW UP:  Return  in about 2 months (around 06/28/2011).

## 2011-04-28 NOTE — Assessment & Plan Note (Signed)
Her depression is ongoing; long history of intolerance to MANY, MANY meds. Anxiety well controlled with qid librium. Spent 25 min with patient today, with 50% of this time spent in counseling regarding her dep/anx and discussing treatment options.  Encouraged patient to focus on nonmedication therapies, strongly consider giving therapy another try and for longer period of time.  Continue librium, continue neurontin 136m qhs (although I encouraged her to try 200mg  dose qhs), and she was given the contact info for behavioral health locally.

## 2011-05-26 ENCOUNTER — Telehealth: Payer: Self-pay | Admitting: Internal Medicine

## 2011-05-26 MED ORDER — SPIRONOLACTONE 25 MG PO TABS: 25.0000 mg | ORAL_TABLET | Freq: Every day | ORAL | Status: DC

## 2011-05-26 NOTE — Telephone Encounter (Signed)
Rx refill sent to pharmacy. 

## 2011-05-26 NOTE — Telephone Encounter (Signed)
Refill- spironolactone 25mg  tab. Take 1 tablet by mouth once daily. Qty 90. Last fill 3.28.12

## 2011-06-11 ENCOUNTER — Telehealth: Payer: Self-pay | Admitting: Internal Medicine

## 2011-06-11 MED ORDER — CHOLESTYRAMINE LIGHT 4 G PO PACK: PACK | ORAL | Status: DC

## 2011-06-11 NOTE — Telephone Encounter (Signed)
Pt states that Dr. Leone Payor gave her a prescription for Prevalite to use when she has diarrhea. What she had expired and she had to take it yesterday. Pt is requesting a refill on the Prevalite. Please advise.

## 2011-06-11 NOTE — Telephone Encounter (Signed)
Ok to refill - 1 packet at bedtime, hold for constipation # 30 with 2 refills

## 2011-06-12 NOTE — Assessment & Plan Note (Addendum)
Pt was not able to tolerate Viibryd.  She has multiple psychotropic intolerances.   Continue librium qid.  I don't think she will be able to taper off librium.   I encouraged regular exercise.  She has multiple aches and pains which worsen insomnia.  Trial of gabapentin as directed. Pt advised to avoid all caffienated beverages.  Reconsider trying counseling therapy.

## 2011-06-13 ENCOUNTER — Telehealth: Payer: Self-pay | Admitting: Internal Medicine

## 2011-06-13 NOTE — Telephone Encounter (Signed)
Patient advised to try imodium and the cholestyramine over the weekend.  If she still has diarrhea next week call me back to work in with the extender.

## 2011-06-17 ENCOUNTER — Encounter: Payer: Self-pay | Admitting: Family

## 2011-06-17 ENCOUNTER — Telehealth: Payer: Self-pay | Admitting: Internal Medicine

## 2011-06-17 ENCOUNTER — Telehealth: Payer: Self-pay | Admitting: Family

## 2011-06-17 ENCOUNTER — Ambulatory Visit (HOSPITAL_BASED_OUTPATIENT_CLINIC_OR_DEPARTMENT_OTHER)
Admission: RE | Admit: 2011-06-17 | Discharge: 2011-06-17 | Disposition: A | Discharge status: Home / Self Care | Payer: Medicare Other | Source: Ambulatory Visit | Attending: Family | Admitting: Family

## 2011-06-17 ENCOUNTER — Encounter (HOSPITAL_BASED_OUTPATIENT_CLINIC_OR_DEPARTMENT_OTHER): Payer: Self-pay

## 2011-06-17 ENCOUNTER — Ambulatory Visit (INDEPENDENT_AMBULATORY_CARE_PROVIDER_SITE_OTHER): Payer: Medicare Other | Admitting: Family

## 2011-06-17 ENCOUNTER — Telehealth: Payer: Self-pay | Admitting: *Deleted

## 2011-06-17 DIAGNOSIS — K219 Gastro-esophageal reflux disease without esophagitis: Secondary | ICD-10-CM

## 2011-06-17 DIAGNOSIS — R109 Unspecified abdominal pain: Secondary | ICD-10-CM

## 2011-06-17 DIAGNOSIS — K7689 Other specified diseases of liver: Secondary | ICD-10-CM

## 2011-06-17 DIAGNOSIS — R197 Diarrhea, unspecified: Secondary | ICD-10-CM

## 2011-06-17 LAB — CBC WITH DIFFERENTIAL/PLATELET
HCT: 45.8 % (ref 36.0–46.0)
Hemoglobin: 15.9 g/dL — ABNORMAL HIGH (ref 12.0–15.0)
Lymphocytes Relative: 31 % (ref 12–46)
Lymphs Abs: 2.5 10*3/uL (ref 0.7–4.0)
Monocytes Absolute: 0.9 10*3/uL (ref 0.1–1.0)
Monocytes Relative: 11 % (ref 3–12)
Neutro Abs: 4.6 10*3/uL (ref 1.7–7.7)
Neutrophils Relative %: 57 % (ref 43–77)
RBC: 5.13 MIL/uL — ABNORMAL HIGH (ref 3.87–5.11)

## 2011-06-17 LAB — COMPREHENSIVE METABOLIC PANEL
Albumin: 4.6 g/dL (ref 3.5–5.2)
BUN: 13 mg/dL (ref 6–23)
Calcium: 9.8 mg/dL (ref 8.4–10.5)
Chloride: 104 mEq/L (ref 96–112)
Creat: 0.73 mg/dL (ref 0.50–1.10)
Glucose, Bld: 100 mg/dL — ABNORMAL HIGH (ref 70–99)
Potassium: 4.2 mEq/L (ref 3.5–5.3)

## 2011-06-17 LAB — LIPASE: Lipase: 21 U/L (ref 0–75)

## 2011-06-17 MED ORDER — IOHEXOL 300 MG/ML  SOLN
100.0000 mL | Freq: Once | INTRAMUSCULAR | Status: AC | PRN
  Administered 2011-06-17: 100 mL via INTRAVENOUS

## 2011-06-17 NOTE — Assessment & Plan Note (Addendum)
Case was discussed with pt's GI- Dr. Stan Head.  He recommended that we check labs and a CT abdomen to exclude ischemic colitis.   If recurrent ischemic colitis, he recommends supportive care- BRAT diet.  He requested that the results be forwarded to him She is scheduled for a f/u apt on Friday at GI.    Reviewed CT abdomen/pelvis and labs- unremarkable.  Suspect that diarrhea is either viral etiology or due to flare up of IBS.  See phone note.

## 2011-06-17 NOTE — Patient Instructions (Signed)
Please complete your lab work on the first floor and CT on the first floor this afternoon. Follow up with Willette Cluster on Friday as scheduled. Call if you develop recurrent blood in stool- go to ER if severe. Try to stick to a low residue bland diet BRAT diet: Bananas, Rice, Applesauce, Toast.  Drink plenty of fluids.

## 2011-06-17 NOTE — Progress Notes (Signed)
Subjective:    Patient ID: Cynthia Gilmore, female    DOB: [birth year], 77 y.o.   MRN: [id number]  HPI  Cynthia Gilmore is a 77 yr old female who presents today with chief complaint of diarrhea.  She reports that symptoms started on Thursday.  Started out "black" and now is "real light."  She saw a small amount of blood on a "piece of the stool" this AM.  She has associated abdominal cramping on Thursday- worse on Saturday and Sunday.  Pain wraps around the lower portion of her abdomen.  She denies any recent antibiotic use.  She is using prevalite this AM.  She has had 6-8 BM's this AM- somewhat formed in texture.  + associated weakness. She has a history of ischemic colitis back in 2009.       Review of Systems See HPI  Past Medical History  Diagnosis Date  . Anxiety and depression     ? bipolar  . GERD (gastroesophageal reflux disease)   . Hypertension   . TIA (transient ischemic attack) 2000  . Osteoarthritis (arthritis due to wear and tear of joints)   . IBS (irritable bowel syndrome)   . Segmental colitis     iscemic colitis 2009    History   Social History  . Marital Status: Married    Spouse Name: N/A    Number of Children: N/A  . Years of Education: N/A   Occupational History  . Not on file.   Social History Main Topics  . Smoking status: Former Games developer  . Smokeless tobacco: Not on file   Comment: stopped in 1962  . Alcohol Use: Not on file  . Drug Use: Not on file  . Sexually Active: Not on file   Other Topics Concern  . Not on file   Social History Narrative   Occupation: retiredPatient is a former smoker. -stopped 1962Alcohol Use - no  Illicit Drug Use - no      Daily Caffeine Use  -1 cup daily         Past Surgical History  Procedure Date  . Bladder repair 1995  . Cholecystectomy 2004  . Hernia repair 1995  . Total abdominal hysterectomy 1992    partial  . Ankle surgery 1999    right ankle fracture surgery  . Cataract extraction, bilateral      Family History  Problem Relation Age of Onset  . Diabetes Mother   . Diabetes      aunts, uncles  . Heart disease Father     aunt    Allergies  Allergen Reactions  . Alprazolam     REACTION: sleepy  . Amlodipine Besylate     REACTION: swelling  . Aripiprazole   . Azithromycin   . Bupropion Hcl     REACTION: insomnia  . Cefaclor     REACTION: mouth blisters  . Celecoxib     REACTION: "drained"  . Cephalexin   . Ciprofloxacin   . Citalopram Hydrobromide     REACTION: memory loss  . Clonidine Hydrochloride     REACTION: headache, dizziness  . Desvenlafaxine   . Divalproex Sodium     REACTION: nausea,tremors,fatigue  . Doxycycline   . Entex     REACTION: nausea, headache  . Estratest     REACTION: headache  . Fluoxetine Hcl   . Furosemide   . Hydrochlorothiazide   . Hydrocodone-Acetaminophen     REACTION: headache  . Lamotrigine     REACTION: mood  swing,chills, nausea  . Lansoprazole   . Lisinopril     REACTION: cough  . Lomotil   . Lorazepam   . Meloxicam     REACTION: nausea, diarrhea  . Methylphenidate Hcl   . Metronidazole   . Mirtazapine     REACTION: swelling  . Nabumetone     REACTION: nausea, headache  . Olmesartan Medoxomil   . Oxycodone-Acetaminophen   . Pantoprazole Sodium   . Paroxetine     REACTION: tremor, insomnia, nausea  . Quetiapine   . Rabeprazole Sodium     REACTION: diarrhea  . Rofecoxib   . Sertraline Hcl     REACTION: exreme confusion  . Sucralfate   . Sumatriptan     REACTION: cardiologist says "no"  . Telithromycin     REACTION: "drained"  . Tramadol-Acetaminophen   . Venlafaxine     REACTION: hypertension, nausea, headache  . Vilazodone Hcl (Viibryd)     Current Outpatient Prescriptions on File Prior to Visit  Medication Sig Dispense Refill  . chlordiazePOXIDE (LIBRIUM) 10 MG capsule Take 1 capsule (10 mg total) by mouth 4 (four) times daily as needed for anxiety.  120 capsule  1  . cholestyramine light  (PREVALITE) 4 G packet Use 1 packet at bedtime as needed for diarrhea  30 packet  2  . cyclobenzaprine (FLEXERIL) 5 MG tablet Take 5 mg by mouth at bedtime as needed.        . gabapentin (NEURONTIN) 100 MG capsule Take one to two caps at night as directed  60 capsule  1  . Omega-3 Fatty Acids (FISH OIL) 1000 MG CAPS Take 1,000 mg by mouth 2 (two) times daily.        . ranitidine (ZANTAC) 150 MG tablet Take 150 mg by mouth 2 (two) times daily.        Marland Kitchen spironolactone (ALDACTONE) 25 MG tablet Take 1 tablet (25 mg total) by mouth daily.  90 tablet  1  . valsartan (DIOVAN) 320 MG tablet Take 320 mg by mouth daily.          BP 130/66  Pulse 84  Temp(Src) 97.9 F (36.6 C) (Oral)  Resp 16  Ht 5' 5.5" (1.664 m)  Wt 179 lb 0.6 oz (81.212 kg)  BMI 29.34 kg/m2       Objective:   Physical Exam  Constitutional: She appears well-developed and well-nourished.  Cardiovascular: Normal rate and regular rhythm.   Pulmonary/Chest: Effort normal and breath sounds normal.  Abdominal: Soft. She exhibits no distension.       + RLQ and LLQ tenderness without guarding.   Musculoskeletal: She exhibits no edema.  Psychiatric: She has a normal mood and affect. Her behavior is normal. Judgment and thought content normal.          Assessment & Plan:

## 2011-06-17 NOTE — Telephone Encounter (Signed)
Patient is scheduled for Friday with Willette Cluster RNP at 1:30.  See previous phone note from 06/13/11

## 2011-06-17 NOTE — Telephone Encounter (Signed)
Received call from [facility] in Imaging stating pt had stat BUN / Creat picked up at 3:45pm today. Results may take up to 4 hours to be completed. Notified Sandford Craze, NP and received authorization to do CT without IV contrast, oral solution only. Authorization given to [facility] to proceed without IV contrast.

## 2011-06-17 NOTE — Telephone Encounter (Signed)
prevalite not helping the diarrhea. Tender in LLQ and some rectal bleeding. No fever. Advised labs and CT abd/pelvis to look for possible ischemic colitis Will f/u

## 2011-06-18 NOTE — Telephone Encounter (Signed)
Late entry, called pt last night and reviewed results of CT- unremarkable. Recommended that she stay well hydrated and that she keep her upcoming apt with GI on Friday.  She verbalized understanding.

## 2011-06-20 ENCOUNTER — Encounter: Payer: Self-pay | Admitting: *Deleted

## 2011-06-20 ENCOUNTER — Ambulatory Visit (INDEPENDENT_AMBULATORY_CARE_PROVIDER_SITE_OTHER): Payer: Medicare Other | Admitting: Nurse Practitioner

## 2011-06-20 ENCOUNTER — Encounter: Payer: Self-pay | Admitting: Nurse Practitioner

## 2011-06-20 VITALS — BP 120/80 | HR 88 | Ht 65.5 in | Wt 178.2 lb

## 2011-06-20 DIAGNOSIS — K219 Gastro-esophageal reflux disease without esophagitis: Secondary | ICD-10-CM

## 2011-06-20 DIAGNOSIS — R1013 Epigastric pain: Secondary | ICD-10-CM

## 2011-06-20 DIAGNOSIS — K589 Irritable bowel syndrome without diarrhea: Secondary | ICD-10-CM

## 2011-06-20 DIAGNOSIS — R197 Diarrhea, unspecified: Secondary | ICD-10-CM

## 2011-06-20 MED ORDER — GI COCKTAIL ~~LOC~~: 30.0000 mL | Freq: Two times a day (BID) | ORAL | Status: DC

## 2011-06-20 NOTE — Telephone Encounter (Signed)
Addended by: Annett Fabian on: 06/20/2011 09:33 AM   Modules accepted: Orders

## 2011-06-20 NOTE — Progress Notes (Signed)
Cynthia Gilmore [id number] 04-02-1934   HISTORY OR PRESENT ILLNESS : Patient is a 77 year old female followed by Dr. Leone Payor for history of GERD and IBS though she hasn't been seen in over a year now. Last Thursday morning patient developed nausea, upper abdominal cramps and diarrhea. She has diarrhea predominate IBS associated with lower abdominal cramping but this diarrhea has been much worse and cramps were in upper abdomen. She has had some "flecks" of blood in her stool, no significant bleeding. She has a history of ischemic colitis and inquires about whether this is recurrent colitis. She had been eating spicy foods and wonders whether it contributed. No fever. Her upper abdominal cramping and diarrhea are resolving. Yesterday had only three BMs and today she hasn't had any.   Lately patient has been having post-pandial epigastric pain relieved with Maalox, which she cannot find in the stores now. Cannot take PPIs, they all give her diarrhea. Takes Zantac but that also tends to give her diarrhea. She has numerous allergies, including Carafate so treating her GI symptoms has been challenging.   Current Medications, Allergies, Past Medical History, Past Surgical History, Family History and Social History were reviewed in [employer] record.   PHYSICAL EXAMINATION : General: Well developed  female in no acute distress Head: Normocephalic and atraumatic Eyes:  sclerae anicteric,conjunctive pink. Ears: Normal auditory acuity Mouth: No deformity or lesions Neck: Supple, no masses.  Lungs: Clear throughout to auscultation Heart: Regular rate and rhythm. Abdomen: Soft, nondistended, nontender. No masses or hepatomegaly noted. Normal bowel sounds Rectal: not done Musculoskeletal: Symmetrical with no gross deformities  Skin: No lesions on visible extremities Extremities: No edema or deformities noted Neurological: Alert oriented x 4, grossly nonfocal Cervical Nodes:  No  significant cervical adenopathy Psychological:  Alert and cooperative. Normal mood and affect  ASSESSMENT AND PLAN :

## 2011-06-20 NOTE — Patient Instructions (Addendum)
We have sent a prescription to [facility]  For GI Cocktail. Take Prevlite ( Cholestyramine) daily.  Hold if you get more than usual number of loose stools next week. If you do have a lot of loose stools, then call us and we will order stool studies for you. Stay on a low fiber diet for 1 week. Call us if you have any questions or further problems.

## 2011-06-20 NOTE — Telephone Encounter (Signed)
Records sent to [facility] functional GI clinic.  She will see Willette Cluster RNP today she can discuss with the patient

## 2011-06-20 NOTE — Telephone Encounter (Signed)
CT scan is unrevealing. This must be recurent functional GI problem. She has follow-up with [place] Willette Cluster) today. The patient should be seen at [facility] functional bowel center if she has not followed through with that. Will try to insist that she do that given her complex psychiatric co-morbidities - await today's evaluation. She can keep the 8/28 appointment with me also but she should go to [facility]. I can call her abut this when I am back next week, if needed.

## 2011-06-23 NOTE — Assessment & Plan Note (Addendum)
CTscan negative for colitis and basic labs are unremarkable.This doesn't sound like IBS flare as it was associated with upper abdominal cramps and nausea. Her symptoms are already resolving and I suspect this was viral. Continue daily Cholestyramine, hold for constipation.

## 2011-06-26 NOTE — Progress Notes (Signed)
i agree with plan oulined in this note

## 2011-06-26 NOTE — Assessment & Plan Note (Signed)
Recent episode of upper abdominal cramping, nausea and acute on chronic diarrhea. Basic labs and a CTscan of abdomen and pelvis with contrast negative for acute processes. Not entirely sure her recent GI symptoms were secondary to IBS flare, sounds more like gastroenteritis. She is already improving. Recommended she continue Cholestyramine once daily until BMs back to baseline then continue to use as needed as she normally does. May use Immodium before going out to dinner.

## 2011-06-26 NOTE — Assessment & Plan Note (Signed)
Chronic, intermittent upper abdominal pain felt to be functional in nature. Numerous medication allergies have made it difficult to treat her GI symptoms through the years.  Will try GI cocktail and her primary gastroenterologist, Dr. Leone Payor, would like to refer her to [facility] for further evaluation and treatment of functional bowel.

## 2011-06-27 ENCOUNTER — Ambulatory Visit: Payer: Medicare Other | Admitting: Internal Medicine

## 2011-07-17 ENCOUNTER — Encounter: Payer: Self-pay | Admitting: Internal Medicine

## 2011-07-17 NOTE — Progress Notes (Signed)
  Seen by Dr. Cheron Schaumann at [facility].  He agreed with IBS and a superimposed element of bile salt diarrhea. He wanted her to use a small portion of Questran every day to avoid swings in diarrhea and constipation starting out with a quarter pack of Questran. She was to minimize the use of Imodium. She was given a prescription for hyoscyamine 125 mg for bad cramps but predicted she will probably be intolerant given her overall history. He did celiac testing with gtt. antibody that was normal as well as a normal IgA level. He thought she should followup here for a colonoscopy and possible upper endoscopy.

## 2011-07-17 NOTE — Progress Notes (Signed)
This encounter was created in error - please disregard.

## 2011-07-22 ENCOUNTER — Encounter: Payer: Self-pay | Admitting: Internal Medicine

## 2011-07-22 ENCOUNTER — Ambulatory Visit (INDEPENDENT_AMBULATORY_CARE_PROVIDER_SITE_OTHER): Payer: Medicare Other | Admitting: Internal Medicine

## 2011-07-22 VITALS — BP 128/72 | HR 60 | Ht 65.0 in | Wt 180.0 lb

## 2011-07-22 DIAGNOSIS — R1013 Epigastric pain: Secondary | ICD-10-CM

## 2011-07-22 DIAGNOSIS — R1319 Other dysphagia: Secondary | ICD-10-CM

## 2011-07-22 DIAGNOSIS — K589 Irritable bowel syndrome without diarrhea: Secondary | ICD-10-CM

## 2011-07-22 DIAGNOSIS — R197 Diarrhea, unspecified: Secondary | ICD-10-CM

## 2011-07-22 MED ORDER — NA SULFATE-K SULFATE-MG SULF 17.5-3.13-1.6 GM/177ML PO SOLN: 1.0000 | ORAL | Status: DC

## 2011-07-22 NOTE — Assessment & Plan Note (Signed)
Though low yield I think colonoscopy with biopsies to exclude microscopic colitis is reasonable and will do so. TTG Ab neg at [facility]. Continue cholestyramine at low dose every day. Underlying anxiety an issue and suspect possible somatization component.

## 2011-07-22 NOTE — Patient Instructions (Signed)
You have been scheduled for an Endoscopy/Colonoscopy with separate instructions given. Your prep kit has been sent to your pharmacy for you to pick up. 

## 2011-07-22 NOTE — Assessment & Plan Note (Signed)
Chronic and recurrent - likely IBS Colonoscopy with random bxs to investigate - use propofol due to anxiety and medications she is on - to provide better sedation I did explain that many of her suspected allergies and side effects could just be her chronic GI sxs waxing and waning and not cause and effect I think her husband understood but not sure she did.

## 2011-07-22 NOTE — Assessment & Plan Note (Addendum)
Chronic and recurrent - most likely non-ulcer dyspepsia Consider carafate - though she says she has side effects they are her chronic sxs of diarrhea and gas, etc

## 2011-07-22 NOTE — Progress Notes (Signed)
  Subjective:    Patient ID: Cynthia Gilmore, female    DOB: 1934/08/22, 77 y.o.   MRN: [id number]  HPI She got diarrhea when Zantac restarted. And taken for two days. Any time she takes a PPI or H2 blocker more than 2 days in a row she will get diarrhea. Does use Tums and other antacids with some success, intermittently. GI cocktail gave her diarrhea also. Still has upper abdominal pain - continuous hurting. Has not had a cramp so has not used hyoscyamine prescribed by Dr. Cheron Schaumann. Recurrent dysphagia - painful swallowing and liquids and solids stick in suprasternal notch and has recurred over past few months. Very painful.   Review of Systems + anxiety    Objective:   Physical Exam General: overweight NAD Eyes: anicteric Lungs: clear Heart: S1S2 no rubs, murmurs or gallops Abdomen: soft and nontender, BS+ Ext: no edema          Assessment & Plan:

## 2011-07-22 NOTE — Assessment & Plan Note (Signed)
She has responded to dilation in the past Will try that again  Manometry was normal in 2009

## 2011-07-29 ENCOUNTER — Encounter: Payer: Self-pay | Admitting: Internal Medicine

## 2011-07-29 ENCOUNTER — Ambulatory Visit (AMBULATORY_SURGERY_CENTER): Payer: Medicare Other | Admitting: Internal Medicine

## 2011-07-29 ENCOUNTER — Telehealth: Payer: Self-pay | Admitting: Internal Medicine

## 2011-07-29 VITALS — BP 136/67 | HR 72 | Temp 98.8°F | Resp 12 | Ht 65.0 in | Wt 180.0 lb

## 2011-07-29 DIAGNOSIS — K219 Gastro-esophageal reflux disease without esophagitis: Secondary | ICD-10-CM

## 2011-07-29 DIAGNOSIS — K648 Other hemorrhoids: Secondary | ICD-10-CM

## 2011-07-29 DIAGNOSIS — K449 Diaphragmatic hernia without obstruction or gangrene: Secondary | ICD-10-CM

## 2011-07-29 DIAGNOSIS — K5289 Other specified noninfective gastroenteritis and colitis: Secondary | ICD-10-CM

## 2011-07-29 DIAGNOSIS — R1013 Epigastric pain: Secondary | ICD-10-CM

## 2011-07-29 DIAGNOSIS — R1319 Other dysphagia: Secondary | ICD-10-CM

## 2011-07-29 DIAGNOSIS — K319 Disease of stomach and duodenum, unspecified: Secondary | ICD-10-CM

## 2011-07-29 DIAGNOSIS — R197 Diarrhea, unspecified: Secondary | ICD-10-CM

## 2011-07-29 DIAGNOSIS — K2970 Gastritis, unspecified, without bleeding: Secondary | ICD-10-CM

## 2011-07-29 HISTORY — PX: ESOPHAGOGASTRODUODENOSCOPY: SHX1529

## 2011-07-29 HISTORY — PX: COLONOSCOPY W/ BIOPSIES AND POLYPECTOMY: SHX1376

## 2011-07-29 MED ORDER — SODIUM CHLORIDE 0.9 % IV SOLN: 500.0000 mL | INTRAVENOUS | Status: DC

## 2011-07-29 NOTE — Telephone Encounter (Signed)
Pt. Concerned about still going to bathroom when attempting to get to office, informed pt. That most people are still going when they arrive that we recommend going by instructions given. Advised pt. To use restroom just before leaving home. Pt. Stated she wear depends anyway.

## 2011-07-29 NOTE — Progress Notes (Signed)
SEE MULTIPLE (#47 DRUG ALLERGIES) ALLERGIES.  LIST IN CHART.  MAW

## 2011-07-29 NOTE — Patient Instructions (Addendum)
There was inflammation in the esophagus from acid reflux. The stomach was inflamed also. Biopsies of the stomach were taken. The esophagus was dilated. Please follow the special diet given today.  The colonoscopy looked ok overall. I think it was normal but took biopsies to look for microscopic inflammation or colitis. You do have some small hemorrhoids which are not a significant health issue.  I will call with the results and plans by next week.  Iva Boop, MD, [facility]

## 2011-07-30 ENCOUNTER — Telehealth: Payer: Self-pay | Admitting: Internal Medicine

## 2011-07-30 ENCOUNTER — Telehealth: Payer: Self-pay

## 2011-07-30 MED ORDER — VALSARTAN 320 MG PO TABS: 320.0000 mg | ORAL_TABLET | Freq: Every day | ORAL | Status: DC

## 2011-07-30 NOTE — Telephone Encounter (Signed)
Rx refill sent to pharmacy. 

## 2011-07-30 NOTE — Telephone Encounter (Signed)
No ID on answering machine. 

## 2011-07-30 NOTE — Telephone Encounter (Signed)
Refill- diovan 320mg  tab. Take one tablet by mouth once daily. Qty 90. Last fill 7.30.12

## 2011-08-04 ENCOUNTER — Encounter: Payer: Self-pay | Admitting: Internal Medicine

## 2011-08-04 NOTE — Progress Notes (Signed)
Quick Note:  Office : let her know she has lymphocytic colitis - benign but tricky disease at times - may respond to budesonide or prednisone - can she take either? If says ok will try ( I will let you know Rx)  [facility] no letter and no recall    ______

## 2011-08-05 MED ORDER — BUDESONIDE 3 MG PO CP24: 9.0000 mg | ORAL_CAPSULE | ORAL | Status: DC

## 2011-08-05 NOTE — Assessment & Plan Note (Signed)
Trial of budesonide 9 mg/day REV 6-8 weeks Coordinated via phone call

## 2011-08-05 NOTE — Progress Notes (Signed)
Addended by: Stan Head E on: 08/05/2011 12:10 PM   Modules accepted: Orders

## 2011-08-05 NOTE — Progress Notes (Signed)
I prescribed it via addendum to procedure visit note 9 mg daily  Needs REV 6-8 weeks

## 2011-08-11 ENCOUNTER — Telehealth: Payer: Self-pay | Admitting: Internal Medicine

## 2011-08-11 NOTE — Telephone Encounter (Signed)
Dr Rodena Medin is it okay to refill Chlordiazepoxide for this patient?

## 2011-08-11 NOTE — Telephone Encounter (Signed)
Refill- chlordiazepoxide hcl 10mg  cap. Take one capsule by mouth three times daily. Qty 90. Last fill 8.23.12

## 2011-08-11 NOTE — Telephone Encounter (Signed)
Ok as previous

## 2011-08-12 ENCOUNTER — Telehealth: Payer: Self-pay | Admitting: Internal Medicine

## 2011-08-12 DIAGNOSIS — K52832 Lymphocytic colitis: Secondary | ICD-10-CM

## 2011-08-12 MED ORDER — CHLORDIAZEPOXIDE HCL 10 MG PO CAPS: 10.0000 mg | ORAL_CAPSULE | Freq: Four times a day (QID) | ORAL | Status: DC | PRN

## 2011-08-12 NOTE — Telephone Encounter (Signed)
Rx refill sent to pharmacy. 

## 2011-08-13 NOTE — Telephone Encounter (Signed)
Patient called stating Budesonide is too expensive. Stated her insurance said if she just got the regular it would be cheaper. I called her insurance and the stated that the alternatives are Methylpred tablets, Prednilosone, or Prednisone. Do you want to switch patient to one of these or should the patient stay on current med?

## 2011-08-14 MED ORDER — PREDNISONE 10 MG PO TABS: ORAL_TABLET | ORAL | Status: DC

## 2011-08-14 NOTE — Telephone Encounter (Signed)
I talked with the patient to let her know tat medication was changed to Prednisone and patient stated that she can't take prednisone. She has taken it before and she had side effect from it (swelling). She would rather not take that. Patient question is there anything else?

## 2011-08-14 NOTE — Telephone Encounter (Signed)
Changed to prednisone - prescription sent

## 2011-08-14 NOTE — Telephone Encounter (Signed)
Probably not - only symptomatic control  Cancel the prednisone rx and schedule an office visit (not urgent) to discuss this

## 2011-08-14 NOTE — Telephone Encounter (Signed)
Left a message on the patient's voice mail to return my call.

## 2011-08-14 NOTE — Assessment & Plan Note (Signed)
Cannot afford budesonide will try prednisone

## 2011-08-18 ENCOUNTER — Other Ambulatory Visit: Payer: Self-pay | Admitting: Internal Medicine

## 2011-08-18 MED ORDER — SPIRONOLACTONE 25 MG PO TABS: 25.0000 mg | ORAL_TABLET | Freq: Every day | ORAL | Status: DC

## 2011-08-18 NOTE — Telephone Encounter (Signed)
rx sent in electronically 

## 2011-08-18 NOTE — Telephone Encounter (Signed)
Pt req refill of spironolactone (ALDACTONE) 25 MG to [facility]. Pt req refill asap.

## 2011-08-20 NOTE — Telephone Encounter (Signed)
Patient finally called back and stated that she picked up the prednisone prescription and she can't take it. Prednisone causes swelling and nausea. Patient stated that the lady at the insurance office stated the would work with her on the Budesonide and the co-pay. She stated she would call her insurance back and they may be contacting our office.

## 2011-08-25 ENCOUNTER — Telehealth: Payer: Self-pay | Admitting: Internal Medicine

## 2011-08-25 NOTE — Telephone Encounter (Signed)
Patient called stating that she can't take the Budesonide either. She is having side effects from it (heartburn, indigestion, can't sleep at night), states she wakes up every morning crying. Has taken medication from Sept 12-28 th. What can she do or what else can she take?

## 2011-08-25 NOTE — Telephone Encounter (Signed)
I do not know - she has so many sensitivities and allergies that I am unable to recommend anything else. She may schedule an REV next available and would give her two slots.

## 2011-08-25 NOTE — Telephone Encounter (Signed)
Talked to the patient and appointment was scheduled for 09/26/11 at 3:45 pm.

## 2011-09-02 ENCOUNTER — Ambulatory Visit: Payer: Medicare Other | Admitting: Internal Medicine

## 2011-09-02 LAB — BASIC METABOLIC PANEL
CO2: 23
Chloride: 103
Creatinine, Ser: 0.65
GFR calc Af Amer: >60
Glucose, Bld: 102 — ABNORMAL HIGH

## 2011-09-02 LAB — DIFFERENTIAL
Basophils Absolute: 0
Basophils Relative: 0
Eosinophils Absolute: 0.1 — ABNORMAL LOW
Eosinophils Relative: 1
Lymphocytes Relative: 30
Lymphs Abs: 2.3
Monocytes Absolute: 0.8
Monocytes Relative: 10
Neutro Abs: 4.7
Neutrophils Relative %: 60

## 2011-09-02 LAB — POCT CARDIAC MARKERS
CKMB, poc: 1.4
CKMB, poc: 1.6
Myoglobin, poc: 69.7
Troponin i, poc: <0.05

## 2011-09-02 LAB — BASIC METABOLIC PANEL WITH GFR
BUN: 11
Calcium: 9
GFR calc non Af Amer: >60
Potassium: 4.8
Sodium: 136

## 2011-09-02 LAB — CBC
HCT: 43.5
Hemoglobin: 14.9
MCHC: 34.3
MCV: 89.4
Platelets: 288
RBC: 4.86
RDW: 12.7
WBC: 7.9

## 2011-09-02 LAB — PROTIME-INR
INR: 0.9
Prothrombin Time: 12.8

## 2011-09-02 LAB — APTT: aPTT: 24

## 2011-09-04 ENCOUNTER — Telehealth: Payer: Self-pay | Admitting: Internal Medicine

## 2011-09-04 NOTE — Telephone Encounter (Signed)
Received copies from [facility],on . Forwarded  8pages to Dr. Leone Payor ,for review.

## 2011-09-08 ENCOUNTER — Ambulatory Visit: Payer: Medicare Other | Admitting: Internal Medicine

## 2011-09-09 ENCOUNTER — Ambulatory Visit: Payer: Medicare Other

## 2011-09-26 ENCOUNTER — Ambulatory Visit: Payer: Medicare Other | Admitting: Internal Medicine

## 2011-10-07 ENCOUNTER — Other Ambulatory Visit: Payer: Self-pay | Admitting: *Deleted

## 2011-10-07 NOTE — Telephone Encounter (Signed)
Refill chlordiazepoxide 10 mg 1 capsule po qid prn for anxiety

## 2011-10-08 ENCOUNTER — Encounter: Payer: Self-pay | Admitting: Internal Medicine

## 2011-10-08 ENCOUNTER — Ambulatory Visit (INDEPENDENT_AMBULATORY_CARE_PROVIDER_SITE_OTHER): Payer: Medicare Other | Admitting: Internal Medicine

## 2011-10-08 VITALS — BP 132/74 | HR 76 | Temp 97.5°F | Wt 184.0 lb

## 2011-10-08 DIAGNOSIS — R42 Dizziness and giddiness: Secondary | ICD-10-CM | POA: Insufficient documentation

## 2011-10-08 DIAGNOSIS — F341 Dysthymic disorder: Secondary | ICD-10-CM

## 2011-10-08 MED ORDER — CHLORDIAZEPOXIDE HCL 10 MG PO CAPS: 10.0000 mg | ORAL_CAPSULE | Freq: Four times a day (QID) | ORAL | Status: DC | PRN

## 2011-10-08 MED ORDER — MECLIZINE HCL 12.5 MG PO TABS
12.5000 mg | ORAL_TABLET | Freq: Three times a day (TID) | ORAL | Status: AC | PRN
Start: 2011-10-08 — End: 2011-10-18

## 2011-10-08 NOTE — Telephone Encounter (Signed)
rx called in

## 2011-10-08 NOTE — Assessment & Plan Note (Signed)
77 year old female with intermittent vertigo. I suspect her symptoms are positional. I recommend trial of vestibular rehabilitation.  Use meclizine as needed.

## 2011-10-08 NOTE — Progress Notes (Signed)
Subjective:    Patient ID: Cynthia Gilmore, female    DOB: Dec 01, 1933, 77 y.o.   MRN: [id number]  HPI  77 year old white female complains of intermittent balance problems x1 month.  Her last symptom occurred while she was walking her dog. She feels like she was leaning to the left. She also has associated lightheadedness. Lightheadedness is not aggravated by prolonged sitting.  Her dizziness does get worse with leaning forward / changes in head position.  During one of these episodes, patient measured her blood pressure and pulse which were both normal.  Anxiety-patient continues to have intermittent mood swings. She has tried multiple medications in the past which she could not tolerate.   Review of Systems Negative for chest pain.  Negative for headache  Past Medical History  Diagnosis Date  . Anxiety and depression     ? bipolar  . GERD (gastroesophageal reflux disease)   . Hypertension   . TIA (transient ischemic attack) 2000    x 3   . Osteoarthritis (arthritis due to wear and tear of joints)   . IBS (irritable bowel syndrome)   . Segmental colitis     iscemic colitis 2009  . Allergy   . Cataract     bil cataracts removed    History   Social History  . Marital Status: Married    Spouse Name: N/A    Number of Children: 2  . Years of Education: N/A   Occupational History  . Retired    Social History Main Topics  . Smoking status: Former Games developer  . Smokeless tobacco: Not on file   Comment: stopped in 1962  . Alcohol Use: No  . Drug Use: Not on file  . Sexually Active: Not on file   Other Topics Concern  . Not on file   Social History Narrative   Occupation: retiredPatient is a former smoker. -stopped 1962Alcohol Use - no  Illicit Drug Use - no      Daily Caffeine Use  -1 cup daily         Past Surgical History  Procedure Date  . Bladder repair 1995  . Cholecystectomy 2004  . Hernia repair 1995  . Partial hysterectomy 1992    partial  . Ankle surgery  1999    right ankle fracture surgery  . Cataract extraction, bilateral   . Colonoscopy w/ biopsies and polypectomy  07/29/2011    internal hemorrhoids, lymphocytic colitis  . Esophagogastroduodenoscopy 07/29/2011    gastritis,hiatal hernia, tortuous esophagus  . Esophageal manometry     Normal    Family History  Problem Relation Age of Onset  . Diabetes Mother   . Diabetes Maternal Aunt   . Heart disease Father   . Colon cancer Neg Hx   . Diabetes Maternal Uncle   . Heart disease Paternal Aunt     Allergies  Allergen Reactions  . Alprazolam     REACTION: sleepy  . Amlodipine Besylate     REACTION: swelling  . Aripiprazole   . Azithromycin   . Bupropion Hcl     REACTION: insomnia  . Cefaclor     REACTION: mouth blisters  . Celecoxib     REACTION: "drained"  . Cephalexin   . Ciprofloxacin   . Citalopram Hydrobromide     REACTION: memory loss  . Clonidine Hydrochloride     REACTION: headache, dizziness  . Desvenlafaxine   . Divalproex Sodium     REACTION: nausea,tremors,fatigue  . Doxycycline   .  Entex     REACTION: nausea, headache  . Estratest     REACTION: headache  . Fluoxetine Hcl   . Furosemide   . Hydrochlorothiazide   . Hydrocodone-Acetaminophen     REACTION: headache  . Lamotrigine     REACTION: mood swing,chills, nausea  . Lansoprazole   . Lisinopril     REACTION: cough  . Lomotil   . Lorazepam   . Meloxicam     REACTION: nausea, diarrhea  . Methylphenidate Hcl   . Metronidazole   . Mirtazapine     REACTION: swelling  . Nabumetone     REACTION: nausea, headache  . Olmesartan Medoxomil   . Other     Alum and Mag Hydroxide Simeth(Solution) GI Cocktail. Diarrhea   . Oxycodone-Acetaminophen   . Pantoprazole Sodium   . Paroxetine     REACTION: tremor, insomnia, nausea  . Quetiapine   . Rabeprazole Sodium     REACTION: diarrhea  . Rofecoxib   . Sertraline Hcl     REACTION: exreme confusion  . Sucralfate   . Sumatriptan     REACTION:  cardiologist says "no"  . Telithromycin     REACTION: "drained"  . Topamax     Confusion and mood swings  . Tramadol-Acetaminophen   . Venlafaxine     REACTION: hypertension, nausea, headache  . Verapamil     Headaches and hot flashes   . Vilazodone Hcl (Viibryd)     Current Outpatient Prescriptions on File Prior to Visit  Medication Sig Dispense Refill  . cholestyramine light (PREVALITE) 4 G packet As directed and  as needed for diarrhea       . cyclobenzaprine (FLEXERIL) 5 MG tablet Take 5 mg by mouth at bedtime as needed.        . gabapentin (NEURONTIN) 100 MG capsule Take one to two caps at night as directed  60 capsule  1  . hyoscyamine (ANASPAZ) 0.125 MG TBDP Place 0.125 mg under the tongue as needed.        . Loperamide HCl (IMODIUM PO) Take 1 tablet by mouth daily as needed.        . NON FORMULARY Take 1 tablet by mouth at bedtime. SNOOZE IN.       . ranitidine (ZANTAC) 150 MG tablet Take 150 mg by mouth 2 (two) times daily.        Marland Kitchen spironolactone (ALDACTONE) 25 MG tablet Take 1 tablet (25 mg total) by mouth daily.  90 tablet  1  . valsartan (DIOVAN) 320 MG tablet Take 1 tablet (320 mg total) by mouth daily.  90 tablet  0  . chlordiazePOXIDE (LIBRIUM) 10 MG capsule Take 1 capsule (10 mg total) by mouth 4 (four) times daily as needed for anxiety.  120 capsule  1    BP 132/74  Pulse 76  Temp(Src) 97.5 F (36.4 C) (Oral)  Wt 184 lb (83.462 kg)     Objective:   Physical Exam   Constitutional: Appears well-developed and well-nourished. No distress.  Head: Normocephalic and atraumatic.  Ear:  Right and left ear normal.  TMs clear.  Hearing is grossly normal Mouth/Throat: Oropharynx is clear and moist.  Eyes: Conjunctivae are normal. Pupils are equal, round, and reactive to light.  Neck: Normal range of motion. Neck supple. No thyromegaly present. No carotid bruit Cardiovascular: Normal rate, regular rhythm and normal heart sounds.  Exam reveals no gallop and no friction  rub.  No murmur heard. Pulmonary/Chest: Effort normal and breath  sounds normal.  No wheezes. No rales.  Abdominal: Soft. Bowel sounds are normal. No mass. There is no tenderness.  Neurological: Alert. No cranial nerve deficit.  negative for pronator drift, no dysmetria.  Negative Romberg.  Gait is normal.  Reflexes are equal and symmetric Skin: Skin is warm and dry.  Psychiatric: Normal mood and affect. Behavior is normal.       Assessment & Plan:

## 2011-10-08 NOTE — Assessment & Plan Note (Signed)
No change.  Continue current librium dose.

## 2011-10-08 NOTE — Telephone Encounter (Signed)
Ok to refill x 2  

## 2011-11-03 ENCOUNTER — Other Ambulatory Visit: Payer: Self-pay | Admitting: *Deleted

## 2011-11-03 MED ORDER — VALSARTAN 320 MG PO TABS: 320.0000 mg | ORAL_TABLET | Freq: Every day | ORAL | Status: DC

## 2011-12-10 ENCOUNTER — Ambulatory Visit: Payer: Medicare Other | Admitting: Internal Medicine

## 2012-01-07 ENCOUNTER — Telehealth: Payer: Self-pay | Admitting: *Deleted

## 2012-01-07 NOTE — Telephone Encounter (Signed)
Ok to refill x 2  

## 2012-01-07 NOTE — Telephone Encounter (Signed)
Refill librium 10 mg 4 times daily last ROV 10/08/11

## 2012-01-08 MED ORDER — CHLORDIAZEPOXIDE HCL 10 MG PO CAPS: 10.0000 mg | ORAL_CAPSULE | Freq: Four times a day (QID) | ORAL | Status: DC | PRN

## 2012-01-08 NOTE — Telephone Encounter (Signed)
Librium 10 mg #120 refill x 2 called to [facility].

## 2012-01-14 ENCOUNTER — Encounter: Payer: Self-pay | Admitting: Internal Medicine

## 2012-01-14 ENCOUNTER — Ambulatory Visit (INDEPENDENT_AMBULATORY_CARE_PROVIDER_SITE_OTHER): Payer: Medicare Other | Admitting: Internal Medicine

## 2012-01-14 DIAGNOSIS — J069 Acute upper respiratory infection, unspecified: Secondary | ICD-10-CM | POA: Insufficient documentation

## 2012-01-14 DIAGNOSIS — I10 Essential (primary) hypertension: Secondary | ICD-10-CM

## 2012-01-14 LAB — BASIC METABOLIC PANEL
BUN: 9 mg/dL (ref 6–23)
Calcium: 9.2 mg/dL (ref 8.4–10.5)
Chloride: 103 mEq/L (ref 96–112)
Creatinine, Ser: 0.8 mg/dL (ref 0.4–1.2)
GFR: 78.63 mL/min (ref >60.00)

## 2012-01-14 NOTE — Progress Notes (Signed)
Subjective:    Patient ID: Cynthia Gilmore, female    DOB: 01-08-1934, 78 y.o.   MRN: [id number]  URI  This is a new problem. The current episode started yesterday. The problem has been unchanged. There has been no fever. Associated symptoms include congestion, coughing and a sore throat.   Her husband had similar symptoms for 2-3 weeks.   Review of Systems  HENT: Positive for congestion and sore throat.   Respiratory: Positive for cough.     Past Medical History  Diagnosis Date  . Anxiety and depression     ? bipolar  . GERD (gastroesophageal reflux disease)   . Hypertension   . TIA (transient ischemic attack) 2000    x 3   . Osteoarthritis (arthritis due to wear and tear of joints)   . IBS (irritable bowel syndrome)   . Segmental colitis     iscemic colitis 2009  . Allergy   . Cataract     bil cataracts removed    History   Social History  . Marital Status: Married    Spouse Name: N/A    Number of Children: 2  . Years of Education: N/A   Occupational History  . Retired    Social History Main Topics  . Smoking status: Former Games developer  . Smokeless tobacco: Not on file   Comment: stopped in 1962  . Alcohol Use: No  . Drug Use: Not on file  . Sexually Active: Not on file   Other Topics Concern  . Not on file   Social History Narrative   Occupation: retiredPatient is a former smoker. -stopped 1962Alcohol Use - no  Illicit Drug Use - no      Daily Caffeine Use  -1 cup daily         Past Surgical History  Procedure Date  . Bladder repair 1995  . Cholecystectomy 2004  . Hernia repair 1995  . Partial hysterectomy 1992    partial  . Ankle surgery 1999    right ankle fracture surgery  . Cataract extraction, bilateral   . Colonoscopy w/ biopsies and polypectomy  07/29/2011    internal hemorrhoids, lymphocytic colitis  . Esophagogastroduodenoscopy 07/29/2011    gastritis,hiatal hernia, tortuous esophagus  . Esophageal manometry     Normal    Family History    Problem Relation Age of Onset  . Diabetes Mother   . Diabetes Maternal Aunt   . Heart disease Father   . Colon cancer Neg Hx   . Diabetes Maternal Uncle   . Heart disease Paternal Aunt     Allergies  Allergen Reactions  . Alprazolam     REACTION: sleepy  . Amlodipine Besylate     REACTION: swelling  . Aripiprazole   . Azithromycin   . Bupropion Hcl     REACTION: insomnia  . Cefaclor     REACTION: mouth blisters  . Celecoxib     REACTION: "drained"  . Cephalexin   . Ciprofloxacin   . Citalopram Hydrobromide     REACTION: memory loss  . Clonidine Hydrochloride     REACTION: headache, dizziness  . Desvenlafaxine   . Divalproex Sodium     REACTION: nausea,tremors,fatigue  . Doxycycline   . Entex     REACTION: nausea, headache  . Estratest     REACTION: headache  . Fluoxetine Hcl   . Furosemide   . Hydrochlorothiazide   . Hydrocodone-Acetaminophen     REACTION: headache  . Lamotrigine  REACTION: mood swing,chills, nausea  . Lansoprazole   . Lisinopril     REACTION: cough  . Lomotil   . Lorazepam   . Meloxicam     REACTION: nausea, diarrhea  . Methylphenidate Hcl   . Metronidazole   . Mirtazapine     REACTION: swelling  . Nabumetone     REACTION: nausea, headache  . Olmesartan Medoxomil   . Other     Alum and Mag Hydroxide Simeth(Solution) GI Cocktail. Diarrhea   . Oxycodone-Acetaminophen   . Pantoprazole Sodium   . Paroxetine     REACTION: tremor, insomnia, nausea  . Quetiapine   . Rabeprazole Sodium     REACTION: diarrhea  . Rofecoxib   . Sertraline Hcl     REACTION: exreme confusion  . Sucralfate   . Sumatriptan     REACTION: cardiologist says "no"  . Telithromycin     REACTION: "drained"  . Topamax     Confusion and mood swings  . Tramadol-Acetaminophen   . Venlafaxine     REACTION: hypertension, nausea, headache  . Verapamil     Headaches and hot flashes   . Vilazodone Hcl (Viibryd)     Current Outpatient Prescriptions on File  Prior to Visit  Medication Sig Dispense Refill  . budesonide (ENTOCORT EC) 3 MG 24 hr capsule Take 1 capsule by mouth Three times a day.      . chlordiazePOXIDE (LIBRIUM) 10 MG capsule Take 1 capsule (10 mg total) by mouth 4 (four) times daily as needed for anxiety.  120 capsule  1  . cholestyramine light (PREVALITE) 4 G packet As directed and  as needed for diarrhea       . cyclobenzaprine (FLEXERIL) 5 MG tablet Take 5 mg by mouth at bedtime as needed.        . gabapentin (NEURONTIN) 100 MG capsule Take one to two caps at night as directed  60 capsule  1  . hyoscyamine (ANASPAZ) 0.125 MG TBDP Place 0.125 mg under the tongue as needed.        . Loperamide HCl (IMODIUM PO) Take 1 tablet by mouth daily as needed.        . NON FORMULARY Take 1 tablet by mouth at bedtime. SNOOZE IN.       . ranitidine (ZANTAC) 150 MG tablet Take 150 mg by mouth 2 (two) times daily.        Marland Kitchen spironolactone (ALDACTONE) 25 MG tablet Take 1 tablet (25 mg total) by mouth daily.  90 tablet  1  . valsartan (DIOVAN) 320 MG tablet Take 1 tablet (320 mg total) by mouth daily.  90 tablet  1    BP 130/74  Pulse 65  Temp(Src) 97.8 F (36.6 C) (Oral)  Wt 191 lb (86.637 kg)  SpO2 94%       Objective:   Physical Exam  Constitutional: She appears well-developed and well-nourished.  HENT:  Right Ear: External ear normal.  Left Ear: External ear normal.  Mouth/Throat: Oropharynx is clear and moist.  Cardiovascular: Normal rate, regular rhythm and normal heart sounds.   Pulmonary/Chest: Effort normal and breath sounds normal. She has no wheezes. She has no rales.          Assessment & Plan:

## 2012-01-14 NOTE — Assessment & Plan Note (Signed)
78 year old white female with signs and symptoms of viral URI. Symptomatic treatment discussed.  Patient advised to call office if symptoms persist or worsen.

## 2012-01-14 NOTE — Patient Instructions (Signed)
Please call our office if your symptoms do not improve or gets worse. Please complete the following lab tests before your next follow up appointment: BMET, Fasting lipid panel, TSH - 401.9 Gargle with warm salt water Use mucinex or mucinex DM as needed

## 2012-01-16 NOTE — Progress Notes (Signed)
Quick Note:  Pt aware ______ 

## 2012-03-22 ENCOUNTER — Ambulatory Visit (INDEPENDENT_AMBULATORY_CARE_PROVIDER_SITE_OTHER): Payer: Medicare Other | Admitting: Internal Medicine

## 2012-03-22 ENCOUNTER — Encounter: Payer: Self-pay | Admitting: Internal Medicine

## 2012-03-22 VITALS — BP 134/76 | HR 60 | Ht 65.0 in | Wt 187.0 lb

## 2012-03-22 DIAGNOSIS — R1319 Other dysphagia: Secondary | ICD-10-CM

## 2012-03-22 DIAGNOSIS — R109 Unspecified abdominal pain: Secondary | ICD-10-CM

## 2012-03-22 DIAGNOSIS — R933 Abnormal findings on diagnostic imaging of other parts of digestive tract: Secondary | ICD-10-CM

## 2012-03-22 DIAGNOSIS — R1010 Upper abdominal pain, unspecified: Secondary | ICD-10-CM

## 2012-03-22 DIAGNOSIS — K589 Irritable bowel syndrome without diarrhea: Secondary | ICD-10-CM

## 2012-03-22 DIAGNOSIS — R1013 Epigastric pain: Secondary | ICD-10-CM

## 2012-03-22 DIAGNOSIS — K5289 Other specified noninfective gastroenteritis and colitis: Secondary | ICD-10-CM

## 2012-03-22 DIAGNOSIS — K52832 Lymphocytic colitis: Secondary | ICD-10-CM

## 2012-03-22 MED ORDER — AMITRIPTYLINE HCL 25 MG PO TABS: 25.0000 mg | ORAL_TABLET | Freq: Every day | ORAL | Status: DC

## 2012-03-22 NOTE — Progress Notes (Signed)
  Subjective:    Patient ID: Cynthia Gilmore, female    DOB: 07/16/1934, 78 y.o.   MRN: [id number]  HPI This elderly white woman is here with her husband today because of increasing chronic abdominal pain and new features. She has IBS and history of lymphocytic colitis diagnosed in September 2012. She did probably take a trial of Entocort but did not keep followup so she cannot really remember taking that for sure. It was prescribed. At any rate she says diarrhea is controlled using Prevalite. She occasionally takes hyoscyamine for lower abdominal cramps with relief. She is now having several weeks to 2 months of upper abdominal pain in the left upper quadrant epigastrium and periumbilical. It is sharp and postprandial and there is intense burning pain at times. It's not associated with her diarrhea though most of this is postprandial. She has some early satiety. It is similar to previous problems in that she tends to always have postprandial pain but does burning component is new she says. Occasional reflux in general. She is intolerant of acid suppression for the most part. Zantac causes diarrhea as do other acid suppressive agents.  Wt Readings from Last 3 Encounters:  03/22/12 187 lb (84.823 kg)  01/14/12 191 lb (86.637 kg)  10/08/11 184 lb (83.462 kg)   Medications, allergies, past medical history, past surgical history, family history and social history are reviewed and updated in the EMR.  Review of Systems Currently weaning Librium without problems though the burning abdominal pain started around that time    Objective:   Physical Exam General:  NAD Eyes:   anicteric Lungs:  clear Heart:  S1S2 no rubs, murmurs or gallops Abdomen:  Obese soft and nontender, BS+ Ext:   no edema    Data Reviewed:  Previous pathology, endoscopic evaluations        Assessment & Plan:  Please see the problem oriented charting. Overall I am most suspicious of a functional GI syndrome so we'll  start amitriptyline 25 mg at bedtime if her psychiatrist agrees. She has been to [facility] and they have but she has IBS as well. I know she has a diagnosis of lymphocytic colitis but it is thought there can be inflammatory IBS as well and I think that's probably what she has had. At any rate she is doing reasonably well with respect to the diarrhea. Will address his upper abdominal pain, also evaluate previous gastric nodule see if it has any relationship though low yield but can be hard to determine in the setting of chronic problem so I think a repeat endoscopy is worthwhile.

## 2012-03-22 NOTE — Assessment & Plan Note (Signed)
Probably a manifestation of IBS more than anything. She did receive a trial of Entocort but didn't followup so it's not clear how much of that she ever took. Currently managing diarrhea with Prevalite and for the most part that's working well. Will continue that.

## 2012-03-22 NOTE — Assessment & Plan Note (Signed)
She's having recurrent epigastric as well as periumbilical pain and some left upper quadrant pain. There is now a severe burning component at times and she has postprandial pain and early satiety. Overall this is most likely a manifestation of her IBS and functional gastrointestinal disturbance. I am starting amitriptyline 25 mg at bedtime if her psychiatrist thinks that it's okay. I have explained the rationale for treatment. She has taken in the past to help insomnia. She is weaning off Librium so she think she may end up needing something to help sleep anyway.  She did have a submucosal nodule about 1 cm in size in the antrum and EGD in September. No unlikely to be anything significant, given the increase in her symptoms I think a repeat endoscopy to reassess this as appropriate. Further plans pending those findings and clinical course.The risks and benefits as well as alternatives of endoscopic procedure(s) have been discussed and reviewed. All questions answered. The patient agrees to proceed.

## 2012-03-22 NOTE — Assessment & Plan Note (Signed)
Continues with strangling. I saw multiple rings and endoscopy in September 2012. 54 [address] Maloney dilation did not help. Consider esophageal biopsies. Plan to try and amitriptyline and low dose which may help this. Note that manometry has been normal.

## 2012-03-22 NOTE — Assessment & Plan Note (Signed)
Overall I think this is her unifying GI diagnosis. She will continue Prevalite, she takes hyoscyamine intermittently for lower abdominal cramps. I told her I would refill that, she received that from Dr. Cheron Schaumann at [facility] when she was referred there. Hopefully amitriptyline will provide some benefit.

## 2012-03-22 NOTE — Patient Instructions (Signed)
You have been scheduled for an endoscopy with propofol. Please follow written instructions given to you at your visit today. We have given you a prescription for amitriptyline to take to your pharmacy. Please discuss this medication with your psychiatrist before getting prescription filled.

## 2012-05-05 ENCOUNTER — Encounter: Payer: Self-pay | Admitting: Internal Medicine

## 2012-05-05 ENCOUNTER — Ambulatory Visit (AMBULATORY_SURGERY_CENTER): Payer: Medicare Other | Admitting: Internal Medicine

## 2012-05-05 VITALS — BP 139/110 | HR 75 | Temp 96.9°F | Resp 16 | Ht 65.0 in | Wt 187.0 lb

## 2012-05-05 DIAGNOSIS — R1319 Other dysphagia: Secondary | ICD-10-CM

## 2012-05-05 DIAGNOSIS — K3189 Other diseases of stomach and duodenum: Secondary | ICD-10-CM

## 2012-05-05 DIAGNOSIS — R197 Diarrhea, unspecified: Secondary | ICD-10-CM

## 2012-05-05 DIAGNOSIS — D130 Benign neoplasm of esophagus: Secondary | ICD-10-CM

## 2012-05-05 DIAGNOSIS — K319 Disease of stomach and duodenum, unspecified: Secondary | ICD-10-CM

## 2012-05-05 DIAGNOSIS — R1013 Epigastric pain: Secondary | ICD-10-CM

## 2012-05-05 MED ORDER — SODIUM CHLORIDE 0.9 % IV SOLN: 500.0000 mL | INTRAVENOUS | Status: DC

## 2012-05-05 NOTE — Progress Notes (Signed)
No complaints noted in the recovery room. Maw   

## 2012-05-05 NOTE — Patient Instructions (Addendum)
Handouts were given to your care partner on gastritis and hiatal hernia.  You may resume your prior medications today.  Please call if any questions or concerns.    YOU HAD AN ENDOSCOPIC PROCEDURE TODAY AT THE [facility]: Refer to the procedure report that was given to you for any specific questions about what was found during the examination.  If the procedure report does not answer your questions, please call your gastroenterologist to clarify.  If you requested that your care partner not be given the details of your procedure findings, then the procedure report has been included in a sealed envelope for you to review at your convenience later.  YOU SHOULD EXPECT: Some feelings of bloating in the abdomen. Passage of more gas than usual.  Walking can help get rid of the air that was put into your GI tract during the procedure and reduce the bloating. If you had a lower endoscopy (such as a colonoscopy or flexible sigmoidoscopy) you may notice spotting of blood in your stool or on the toilet paper. If you underwent a bowel prep for your procedure, then you may not have a normal bowel movement for a few days.  DIET: Your first meal following the procedure should be a light meal and then it is ok to progress to your normal diet.  A half-sandwich or bowl of soup is an example of a good first meal.  Heavy or fried foods are harder to digest and may make you feel nauseous or bloated.  Likewise meals heavy in dairy and vegetables can cause extra gas to form and this can also increase the bloating.  Drink plenty of fluids but you should avoid alcoholic beverages for 24 hours.  ACTIVITY: Your care partner should take you home directly after the procedure.  You should plan to take it easy, moving slowly for the rest of the day.  You can resume normal activity the day after the procedure however you should NOT DRIVE or use heavy machinery for 24 hours (because of the sedation medicines used during the  test).    SYMPTOMS TO REPORT IMMEDIATELY: A gastroenterologist can be reached at any hour.  During normal business hours, 8:30 AM to 5:00 PM Monday through Friday, call [phone].  After hours and on weekends, please call the GI answering service at [phone] who will take a message and have the physician on call contact you.     Following upper endoscopy (EGD)  Vomiting of blood or coffee ground material  New chest pain or pain under the shoulder blades  Painful or persistently difficult swallowing  New shortness of breath  Fever of 100F or higher  Black, tarry-looking stools  FOLLOW UP: If any biopsies were taken you will be contacted by phone or by letter within the next 1-3 weeks.  Call your gastroenterologist if you have not heard about the biopsies in 3 weeks.  Our staff will call the home number listed on your records the next business day following your procedure to check on you and address any questions or concerns that you may have at that time regarding the information given to you following your procedure. This is a courtesy call and so if there is no answer at the home number and we have not heard from you through the emergency physician on call, we will assume that you have returned to your regular daily activities without incident.  SIGNATURES/CONFIDENTIALITY: You and/or your care partner have signed paperwork which will  be entered into your electronic medical record.  These signatures attest to the fact that that the information above on your After Visit Summary has been reviewed and is understood.  Full responsibility of the confidentiality of this discharge information lies with you and/or your care-partner.

## 2012-05-05 NOTE — Progress Notes (Signed)
Patient did not experience any of the following events: a burn prior to discharge; a fall within the facility; wrong site/side/patient/procedure/implant event; or a hospital transfer or hospital admission upon discharge from the facility. (G8907) Patient did not have preoperative order for IV antibiotic SSI prophylaxis. (G8918)  

## 2012-05-05 NOTE — Op Note (Signed)
[facility] [address] [place], [place]  [address]  ENDOSCOPY PROCEDURE REPORT  PATIENT:  Tumeka, Chimenti  MR#:  [id number] BIRTHDATE:  March 17, 1934, 78 yrs. old  GENDER:  female  ENDOSCOPIST:  Iva Boop, MD, [facility]  PROCEDURE DATE:  05/05/2012 PROCEDURE:  EGD with biopsy, 19147 ASA CLASS:  Class III INDICATIONS:  Follow-up submucosal gastric nodule, epigastric pain, dysphagia  MEDICATIONS:   These medications were titrated to patient response per physician's verbal order, MAC sedation, administered by CRNA, propofol (Diprivan) 200 mg IV TOPICAL ANESTHETIC:  none  DESCRIPTION OF PROCEDURE:   After the risks benefits and alternatives of the procedure were thoroughly explained, informed consent was obtained.  The LB-GIF Q180 [id number] endoscope was introduced through the mouth and advanced to the second portion of the duodenum, without limitations.  The instrument was slowly withdrawn as the mucosa was fully examined. <<PROCEDUREIMAGES>>  A nodule was found in the antrum. 1.5-2 cm submucosal nodule in mid-antrum greater curve.  Moderate gastritis was found in the body and the antrum of the stomach. Red spots, mottled and erythematous as before.  Multiple rings were seen in the total esophagus. Intermittently seen, spasm likely. Multiple biopsies were obtained and sent to pathology.  A hiatal hernia was found. It was 2 cm in size.  Otherwise the examination was normal. Retroflexed views revealed no abnormalities.    The scope was then withdrawn from the patient and the procedure completed.  COMPLICATIONS:  None  ENDOSCOPIC IMPRESSION: 1) Nodule in the antrum - submucosal, now up to 2 cm - I think it is enlarging 2) Moderate gastritis in the body and the antrum of the stomach  3) Rings, multiple in the total esophagus - intermittently seen, probably spasm - biopsies taken 4) 2 cm hiatal hernia 5) Otherwise normal examination RECOMMENDATIONS: 1) Await  biopsy results 2) will consider EUS of nodule - it appears to be enlarging and she has pain  Iva Boop, MD, [facility]  CC:  The Patient  n. eSIGNED:   Iva Boop at 05/05/2012 09:24 AM  Kerin Ransom, [phone]

## 2012-05-06 ENCOUNTER — Telehealth: Payer: Self-pay | Admitting: *Deleted

## 2012-05-06 NOTE — Telephone Encounter (Signed)
  Follow up Call-  Call back number 05/05/2012 07/29/2011  Post procedure Call Back phone  # [phone]  Permission to leave phone message Yes -     Patient questions:  Do you have a fever, pain , or abdominal swelling? no Pain Score  0 *  Have you tolerated food without any problems? yes  Have you been able to return to your normal activities? yes  Do you have any questions about your discharge instructions: Diet   no Medications  no Follow up visit  no  Do you have questions or concerns about your Care? no  Actions: * If pain score is 4 or above: No action needed, pain <4.

## 2012-05-10 ENCOUNTER — Telehealth: Payer: Self-pay | Admitting: Internal Medicine

## 2012-05-10 MED ORDER — RANITIDINE HCL 150 MG PO TABS: 150.0000 mg | ORAL_TABLET | Freq: Two times a day (BID) | ORAL | Status: DC

## 2012-05-10 NOTE — Telephone Encounter (Signed)
rx sent in electronically 

## 2012-05-10 NOTE — Telephone Encounter (Signed)
Refill-ranitidine hcl 150mg  tab. Take one tablet by mouth twice daily as needed. Qty 60

## 2012-05-11 ENCOUNTER — Telehealth: Payer: Self-pay

## 2012-05-11 DIAGNOSIS — K3189 Other diseases of stomach and duodenum: Secondary | ICD-10-CM

## 2012-05-11 NOTE — Progress Notes (Signed)
Quick Note:  Office - let her know esophageal bxs ok  She is going to have EUS of gastric nodule - Patty will schedule  [facility]  No letter or recall ______

## 2012-05-11 NOTE — Telephone Encounter (Signed)
Pt request call tomorrow if not able to call her before noon today

## 2012-05-11 NOTE — Telephone Encounter (Signed)
Message copied by Donata Duff on Tue May 11, 2012  7:59 AM ------      Message from: Rachael Fee      Created: Tue May 11, 2012  7:25 AM      Regarding: RE: EUs possible?       Paris Lore get her scheduled.                  Glen Kesinger,      She needs upper EUS, radial +/- linear.  60 min, + propofol, next available EUS time. For gastric mass.            Thanks                  ----- Message -----         From: Iva Boop, MD         Sent: 05/10/2012   3:48 PM           To: Rachael Fee, MD      Subject: EUs possible?                                            Jesusita Oka,            This lady has a small submucosal lesion in distal stomach. She has lots of abd pain that is likely functional but ? If this is growing.            Please review EGD report and let me know what you think about EUS - she would need propofol            Thanks            Baldo Ash

## 2012-05-12 ENCOUNTER — Other Ambulatory Visit: Payer: Self-pay

## 2012-05-12 NOTE — Telephone Encounter (Signed)
Pt has been notified and meds reviewed pt was also mailed a copy of instructions to the home

## 2012-05-14 ENCOUNTER — Telehealth: Payer: Self-pay | Admitting: Internal Medicine

## 2012-05-14 MED ORDER — VALSARTAN 320 MG PO TABS: 320.0000 mg | ORAL_TABLET | Freq: Every day | ORAL | Status: DC

## 2012-05-14 MED ORDER — SPIRONOLACTONE 25 MG PO TABS: 25.0000 mg | ORAL_TABLET | Freq: Every day | ORAL | Status: DC

## 2012-05-14 NOTE — Telephone Encounter (Signed)
Pt requesting refill on valsartan (DIOVAN) 320 MG tablet and   spironolactone (ALDACTONE) 25 MG tablet     [facility]

## 2012-05-14 NOTE — Telephone Encounter (Signed)
rx sent in electronically 

## 2012-06-03 ENCOUNTER — Ambulatory Visit (HOSPITAL_COMMUNITY)
Admission: RE | Admit: 2012-06-03 | Discharge: 2012-06-03 | Disposition: A | Discharge status: Home / Self Care | Payer: Medicare Other | Source: Ambulatory Visit | Attending: Gastroenterology | Admitting: Gastroenterology

## 2012-06-03 ENCOUNTER — Ambulatory Visit (HOSPITAL_COMMUNITY): Payer: Medicare Other

## 2012-06-03 ENCOUNTER — Encounter (HOSPITAL_COMMUNITY): Payer: Self-pay

## 2012-06-03 ENCOUNTER — Encounter (HOSPITAL_COMMUNITY): Payer: Self-pay | Admitting: Gastroenterology

## 2012-06-03 ENCOUNTER — Encounter (HOSPITAL_COMMUNITY)
Admission: RE | Disposition: A | Discharge status: Home / Self Care | Payer: Self-pay | Source: Ambulatory Visit | Attending: Gastroenterology

## 2012-06-03 DIAGNOSIS — I10 Essential (primary) hypertension: Secondary | ICD-10-CM | POA: Insufficient documentation

## 2012-06-03 DIAGNOSIS — R1013 Epigastric pain: Secondary | ICD-10-CM

## 2012-06-03 DIAGNOSIS — R109 Unspecified abdominal pain: Secondary | ICD-10-CM | POA: Insufficient documentation

## 2012-06-03 DIAGNOSIS — R933 Abnormal findings on diagnostic imaging of other parts of digestive tract: Secondary | ICD-10-CM

## 2012-06-03 DIAGNOSIS — Z8673 Personal history of transient ischemic attack (TIA), and cerebral infarction without residual deficits: Secondary | ICD-10-CM | POA: Insufficient documentation

## 2012-06-03 DIAGNOSIS — K219 Gastro-esophageal reflux disease without esophagitis: Secondary | ICD-10-CM | POA: Insufficient documentation

## 2012-06-03 DIAGNOSIS — D175 Benign lipomatous neoplasm of intra-abdominal organs: Secondary | ICD-10-CM | POA: Insufficient documentation

## 2012-06-03 HISTORY — PX: EUS: SHX5427

## 2012-06-03 SURGERY — UPPER ENDOSCOPIC ULTRASOUND (EUS) LINEAR
Anesthesia: Monitor Anesthesia Care

## 2012-06-03 MED ORDER — PROPOFOL 10 MG/ML IV EMUL
INTRAVENOUS | Status: DC | PRN
  Administered 2012-06-03: 50 ug/kg/min via INTRAVENOUS

## 2012-06-03 MED ORDER — FENTANYL CITRATE 0.05 MG/ML IJ SOLN
INTRAMUSCULAR | Status: DC | PRN
  Administered 2012-06-03: 100 ug via INTRAVENOUS

## 2012-06-03 MED ORDER — MIDAZOLAM HCL 5 MG/5ML IJ SOLN
INTRAMUSCULAR | Status: DC | PRN
  Administered 2012-06-03: 2 mg via INTRAVENOUS

## 2012-06-03 MED ORDER — LACTATED RINGERS IV SOLN
INTRAVENOUS | Status: DC
  Administered 2012-06-03: 1000 mL via INTRAVENOUS

## 2012-06-03 MED ORDER — PROPOFOL 10 MG/ML IV EMUL
INTRAVENOUS | Status: DC | PRN
  Administered 2012-06-03: 30 mg via INTRAVENOUS

## 2012-06-03 NOTE — H&P (Signed)
HPI: This is a woman with abd pain, subepithelial lesion in stomach    Past Medical History  Diagnosis Date  . Anxiety and depression     ? bipolar  . GERD (gastroesophageal reflux disease)   . Hypertension   . TIA (transient ischemic attack) 2000    x 3   . Osteoarthritis (arthritis due to wear and tear of joints)   . IBS (irritable bowel syndrome)   . Segmental colitis     iscemic colitis 2009  . Allergy   . Cataract     bil cataracts removed  . Hiatal hernia   . Lymphocytic colitis 2012  . Hemorrhoids 2012   . Anxiety   . Depression   . Heart murmur   . Stroke     states 2 "'mini strokes" 1980    Past Surgical History  Procedure Date  . Bladder repair 1995  . Cholecystectomy 2004  . Hernia repair 1995  . Partial hysterectomy 1992    partial  . Ankle surgery 1999    right ankle fracture surgery  . Cataract extraction, bilateral   . Colonoscopy w/ biopsies and polypectomy  07/29/2011    internal hemorrhoids, lymphocytic colitis  . Esophagogastroduodenoscopy 07/29/2011    gastritis,hiatal hernia, tortuous esophagus  . Esophageal manometry     Normal    No current facility-administered medications for this encounter.    Allergies as of 05/12/2012 - Review Complete 05/05/2012  Allergen Reaction Noted  . Alprazolam  01/27/2008  . Amlodipine besylate  02/23/2007  . Aripiprazole  01/09/2009  . Azithromycin  12/09/2007  . Bupropion hcl  09/26/2009  . Cefaclor  02/23/2007  . Celecoxib  09/26/2009  . Cephalexin  01/09/2009  . Ciprofloxacin  01/09/2009  . Citalopram hydrobromide  09/26/2009  . Clonidine hydrochloride  09/26/2009  . Desvenlafaxine  09/26/2009  . Diphenoxylate-atropine  01/09/2009  . Divalproex sodium  01/27/2008  . Doxycycline  01/09/2009  . Entex  09/26/2009  . Est estrogens-methyltest  09/26/2009  . Fluoxetine hcl  01/09/2009  . Furosemide  09/26/2009  . Hydrochlorothiazide  01/09/2009  . Hydrocodone-acetaminophen  09/26/2009  .  Lamotrigine  01/27/2008  . Lansoprazole  01/09/2009  . Lisinopril  09/26/2009  . Lorazepam  01/09/2009  . Meloxicam  09/26/2009  . Methylphenidate hcl  01/09/2009  . Metronidazole  01/09/2009  . Mirtazapine  09/26/2009  . Nabumetone  09/26/2009  . Olmesartan medoxomil  01/09/2009  . Other  07/22/2011  . Oxycodone-acetaminophen  01/09/2009  . Pantoprazole sodium  09/26/2009  . Paroxetine  09/26/2009  . Quetiapine  01/09/2009  . Rabeprazole sodium  09/26/2009  . Rofecoxib  09/26/2009  . Sertraline hcl  02/23/2007  . Sucralfate  01/09/2009  . Sumatriptan  09/26/2009  . Telithromycin  09/26/2009  . Topamax  07/22/2011  . Tramadol-acetaminophen  01/09/2009  . Venlafaxine  09/26/2009  . Verapamil  07/22/2011  . Vilazodone hcl (vilazodone hcl)  03/27/2011    Family History  Problem Relation Age of Onset  . Diabetes Mother   . Diabetes Maternal Aunt   . Heart disease Father   . Diabetes Maternal Uncle   . Heart disease Paternal Aunt   . Colon cancer Neg Hx     History   Social History  . Marital Status: Married    Spouse Name: N/A    Number of Children: 2  . Years of Education: N/A   Occupational History  . Retired    Social History Main Topics  . Smoking status:  Former Smoker  . Smokeless tobacco: Never Used   Comment: stopped in 1962  . Alcohol Use: No  . Drug Use: No  . Sexually Active: Not on file   Other Topics Concern  . Not on file   Social History Narrative   Occupation: retiredPatient is a former smoker. -stopped 1962Alcohol Use - no  Illicit Drug Use - no      Daily Caffeine Use  -1 cup daily           Physical Exam: There were no vitals taken for this visit. Constitutional: generally well-appearing Psychiatric: alert and oriented x3 Abdomen: soft, nontender, nondistended, no obvious ascites, no peritoneal signs, normal bowel sounds     Assessment and plan: 78 y.o. female with subepitheleal gastric lesion  For EUS today

## 2012-06-03 NOTE — Preoperative (Signed)
Beta Blockers   Reason not to administer Beta Blockers:Not Applicable 

## 2012-06-03 NOTE — Anesthesia Preprocedure Evaluation (Addendum)
Anesthesia Evaluation  Patient identified by MRN, date of birth, ID band Patient awake    Reviewed: Allergy & Precautions, H&P , NPO status , Patient's Chart, lab work & pertinent test results  Airway Mallampati: II TM Distance: >3 FB     Dental  (+) Teeth Intact, Caps and Dental Advisory Given,    Pulmonary neg pulmonary ROS,  breath sounds clear to auscultation  Pulmonary exam normal       Cardiovascular hypertension, Pt. on medications Rhythm:Regular Rate:Normal     Neuro/Psych negative neurological ROS  negative psych ROS   GI/Hepatic Neg liver ROS, hiatal hernia, GERD-  Medicated,  Endo/Other  negative endocrine ROS  Renal/GU negative Renal ROS  negative genitourinary   Musculoskeletal negative musculoskeletal ROS (+)   Abdominal   Peds negative pediatric ROS (+)  Hematology negative hematology ROS (+)   Anesthesia Other Findings   Reproductive/Obstetrics negative OB ROS                          Anesthesia Physical Anesthesia Plan  ASA: II  Anesthesia Plan: MAC   Post-op Pain Management:    Induction: Intravenous  Airway Management Planned: Nasal Cannula  Additional Equipment:   Intra-op Plan:   Post-operative Plan:   Informed Consent: I have reviewed the patients History and Physical, chart, labs and discussed the procedure including the risks, benefits and alternatives for the proposed anesthesia with the patient or authorized representative who has indicated his/her understanding and acceptance.   Dental advisory given  Plan Discussed with: CRNA  Anesthesia Plan Comments:        Anesthesia Quick Evaluation

## 2012-06-03 NOTE — Transfer of Care (Signed)
Immediate Anesthesia Transfer of Care Note  Patient: Cynthia Gilmore  Procedure(s) Performed: Procedure(s) (LRB): UPPER ENDOSCOPIC ULTRASOUND (EUS) LINEAR (N/A)  Patient Location: Endoscopy Unit  Anesthesia Type: MAC  Level of Consciousness: sedated  Airway & Oxygen Therapy: Patient Spontanous Breathing and Patient connected to nasal cannula oxygen  Post-op Assessment: Report given to PACU RN and Post -op Vital signs reviewed and stable  Post vital signs: Reviewed and stable  Complications: No apparent anesthesia complications

## 2012-06-03 NOTE — Op Note (Signed)
[facility] [address] [place], [place]  [address]  ENDOSCOPIC ULTRASOUND PROCEDURE REPORT  PATIENT:  Cynthia Gilmore, Cynthia Gilmore  MR#:  [id number] BIRTHDATE:  10-21-1934  GENDER:  female ENDOSCOPIST:  Rachael Fee, MD REFERRED BY:  Iva Boop, M.D., [facility] PROCEDURE DATE:  06/03/2012 PROCEDURE:  Upper EUS ASA CLASS:  Class II INDICATIONS:  chronic abdomina pains.  EGD in 2012 and also last month show subepithelial mass in distal stomach MEDICATIONS:   MAC sedation, administered by CRNA  DESCRIPTION OF PROCEDURE:   After the risks benefits and alternatives of the procedure were  explained, informed consent was obtained. The patient was then placed in the left, lateral, decubitus postion and IV sedation was administered. Throughout the procedure, the patient's blood pressure, pulse and oxygen saturations were monitored continuously.  Under direct visualization, the Pentax EUS Radial [id number] endoscope was introduced through the mouth and advanced to the second portion of the duodenum.  Water was used as necessary to provide an acoustic interface.  Upon completion of the imaging, water was removed and the patient was sent to the recovery room in satisfactory condition. <<PROCEDUREIMAGES>>  Endoscopic findings 1. Normal esophagus 2. Small, yellowish subepithelial mass in distal body of stomach. Normal ovelying mucosa. 3. Normal duodenum  EUS findings: 1. The lesion above corresponded with a hyperechoic 6.68mm mass confined to the deep mucosa layer of gastric wall. 2. No perigastric adenopathy 3. Limited views of pancreas, liver, spleen, portal and splenic vessels were all normal  Impression: 6.72mm gastric lipoma (distal body). This was not sampled today, it is not causing or contributing to any GI symptoms and does not require surveillance.  ______________________________ Rachael Fee, MD  n. eSIGNED:   Rachael Fee at 06/03/2012 12:45 PM  Kerin Ransom, [phone]

## 2012-06-06 ENCOUNTER — Encounter (HOSPITAL_COMMUNITY): Payer: Self-pay | Admitting: Gastroenterology

## 2012-06-07 ENCOUNTER — Telehealth: Payer: Self-pay

## 2012-06-07 NOTE — Telephone Encounter (Signed)
Message copied by Annett Fabian on Mon Jun 07, 2012  3:24 PM ------      Message from: Stan Head E      Created: Mon Jun 07, 2012  1:36 PM       I think she will need an appointment with me - non-urgent      Unless she thinks otherwise      ----- Message -----         From: Rachael Fee, MD         Sent: 06/03/2012  12:45 PM           To: Iva Boop, MD            Just completed EUS                  6.5mm gastric lipoma (distal body). This was not sampled today, it is not causing or contributing to any GI symptoms and does not require surveillance.            dj

## 2012-06-07 NOTE — Telephone Encounter (Signed)
Patient is scheduled for follow up appt for 06/29/12 4:00.  She report that she developed a rash on her arms and blisters on her tongue and the back of her throat Friday night.  She reports it is a burning itch that only happens at night.  She had an EUS on 06/03/12, but symptoms did not start until 06/04/12 in the evening. She only has itching and burning at night when she gets up in the am all symptoms are gone.   She is advised to take benadryl 25-50 mg for the rash and that she should follow up with her primary care.  Dr. Leone Payor she is questioning if the cetacaine spray could be responsible

## 2012-06-07 NOTE — Telephone Encounter (Signed)
Patient advised.

## 2012-06-07 NOTE — Telephone Encounter (Signed)
I agree with PCP assessment of rash Do not think cetacaine would have caused it.

## 2012-06-07 NOTE — Anesthesia Postprocedure Evaluation (Signed)
Anesthesia Post Note  Patient: Cynthia Gilmore  Procedure(s) Performed: Procedure(s) (LRB): UPPER ENDOSCOPIC ULTRASOUND (EUS) LINEAR (N/A)  Anesthesia type: MAC  Patient location: PACU  Post pain: Pain level controlled  Post assessment: Post-op Vital signs reviewed  Last Vitals:  Filed Vitals:   06/03/12 1308  BP: 123/63  Pulse:   Temp:   Resp: 24    Post vital signs: Reviewed  Level of consciousness: sedated  Complications: No apparent anesthesia complications

## 2012-06-29 ENCOUNTER — Ambulatory Visit (INDEPENDENT_AMBULATORY_CARE_PROVIDER_SITE_OTHER): Payer: Medicare Other | Admitting: Internal Medicine

## 2012-06-29 ENCOUNTER — Encounter: Payer: Self-pay | Admitting: Internal Medicine

## 2012-06-29 VITALS — BP 140/60 | HR 64 | Ht 65.5 in | Wt 180.6 lb

## 2012-06-29 DIAGNOSIS — K589 Irritable bowel syndrome without diarrhea: Secondary | ICD-10-CM

## 2012-06-29 DIAGNOSIS — K3189 Other diseases of stomach and duodenum: Secondary | ICD-10-CM

## 2012-06-29 DIAGNOSIS — R1013 Epigastric pain: Secondary | ICD-10-CM

## 2012-06-29 MED ORDER — SUCRALFATE 1 G PO TABS: 1.0000 g | ORAL_TABLET | Freq: Four times a day (QID) | ORAL | Status: DC

## 2012-06-29 NOTE — Progress Notes (Signed)
Patient ID: Cynthia Gilmore, female   DOB: 12-03-1933, 78 y.o.   MRN: [id number]  The patient presents with her husband for follow-up of abdominal pain and IBS. She also has GERD and dyspepsia.  She reports that she has had a bitter taste in her mouth since EUS 2-3 weeks ago (gastric lipoma). She also has intermittent abdominal cramps and still has intermittent diarrhea. Sometimes svere and frequent, as in the past. She believes it is associated with her medication use, especially ranitidine though her allergy list is filled with similar reported symptoms due to a multitude of medications.  She has used gabapentin intermittently and says it helps abdominal pain and GI symptoms. She says hyoscyamine helps some but not as much as the gabapentin.  Medications, allergies, past medical history, past surgical history, family history and social history are reviewed and updated in the EMR.  Ass/Plam:  1. Irritable bowel syndrome   2. Dyspepsia   3. History of lymphocytic colitis 2012   Her challenging problems continue. They remain the same though now has a bitter taste in her mouth. i told her I did not think the EUS caused this. She does have sinus problems which can affect taste and smell. Medicating her is challenging due to numerous allergies or side effects perceived to be caused by medications, though most of the reactions appear to be her IBS symptoms. She did have lymphocytic colitis on bxs 2012 - ? Inflammatory IBS. Could not take Entocort or prednisone. She will become constipated when she takes Imodium so doubt true microscopic colitis. Has negative celiac testing.   1. Continue intermittent hyoscyamine 2. Talk to Dr. Artist Pais about refilling gabapentin - I explained that this medicine is typically taken every day and she should do so if Dr. Artist Pais agrees. 3. Add carafate 1 gram qid (was listed as allergy but diarrhea listed - worth a retry and discussed with her) 4. Return as needed - do not think  additional work-up warranted. She has been to [facility] and they agree with IBS dx.  Cc: Thomos Lemons, DO

## 2012-06-29 NOTE — Patient Instructions (Addendum)
We have sent the following medications to your pharmacy for you to pick up at your convenience: Carafate  Talk to Dr. Artist Pais at your visit 07/14/12 regarding restarting Gabepentin on a regular basis.  Thank you for choosing me and [facility].  Iva Boop, M.D., [facility]

## 2012-07-01 ENCOUNTER — Other Ambulatory Visit (INDEPENDENT_AMBULATORY_CARE_PROVIDER_SITE_OTHER): Payer: Medicare Other

## 2012-07-01 DIAGNOSIS — I10 Essential (primary) hypertension: Secondary | ICD-10-CM

## 2012-07-01 LAB — BASIC METABOLIC PANEL
BUN: 13 mg/dL (ref 6–23)
CO2: 25 mEq/L (ref 19–32)
Chloride: 103 mEq/L (ref 96–112)
Potassium: 3.7 mEq/L (ref 3.5–5.1)

## 2012-07-01 LAB — LIPID PANEL
Cholesterol: 179 mg/dL (ref 0–200)
LDL Cholesterol: 91 mg/dL (ref 0–99)
VLDL: 39.2 mg/dL (ref 0.0–40.0)

## 2012-07-07 ENCOUNTER — Encounter: Payer: Self-pay | Admitting: Internal Medicine

## 2012-07-07 ENCOUNTER — Ambulatory Visit (INDEPENDENT_AMBULATORY_CARE_PROVIDER_SITE_OTHER): Payer: Medicare Other | Admitting: Internal Medicine

## 2012-07-07 VITALS — BP 160/92 | HR 71 | Temp 98.0°F | Wt 181.0 lb

## 2012-07-07 DIAGNOSIS — R000 Tachycardia, unspecified: Secondary | ICD-10-CM

## 2012-07-07 DIAGNOSIS — K589 Irritable bowel syndrome without diarrhea: Secondary | ICD-10-CM

## 2012-07-07 DIAGNOSIS — I10 Essential (primary) hypertension: Secondary | ICD-10-CM

## 2012-07-07 DIAGNOSIS — R002 Palpitations: Secondary | ICD-10-CM

## 2012-07-07 MED ORDER — GABAPENTIN 100 MG PO CAPS: ORAL_CAPSULE | ORAL | Status: DC

## 2012-07-07 MED ORDER — MOMETASONE FUROATE 50 MCG/ACT NA SUSP: 2.0000 | Freq: Every day | NASAL | Status: DC

## 2012-07-07 MED ORDER — METOPROLOL TARTRATE 25 MG PO TABS: 12.5000 mg | ORAL_TABLET | Freq: Two times a day (BID) | ORAL | Status: DC

## 2012-07-07 NOTE — Assessment & Plan Note (Signed)
78 year old white female complains of sense of heart pounding with minimal exertion. I doubt her symptoms are secondary to cardiac ischemia. No change in her EKG. She has chronic right bundle branch block. I suspect her symptoms may be anxiety related. Start low-dose metoprolol 25 mg one half tablet twice daily. Patient advised to call office if symptoms persist or worsen.

## 2012-07-07 NOTE — Assessment & Plan Note (Signed)
Good response to gabapentin.  Take 200 mg at bedtime and 100 mg in AM

## 2012-07-07 NOTE — Assessment & Plan Note (Signed)
Blood pressure is suboptimally controlled. Add beta blocker. Continue Diovan. BP: 160/92 mmHg  Lab Results  Component Value Date   CREATININE 0.5 07/01/2012   Lab Results  Component Value Date   NA 138 07/01/2012   K 3.7 07/01/2012   CL 103 07/01/2012   CO2 25 07/01/2012

## 2012-07-07 NOTE — Progress Notes (Signed)
Subjective:    Patient ID: Cynthia Gilmore, female    DOB: 1934/09/03, 78 y.o.   MRN: [id number]  HPI  78 year old white female with history of depression/anxiety, IBS, and hypertension for followup. Patient reports intermittent dizziness and sensation of her heart pounding with short walks over the last several weeks. She is also experienced nonexertional chest pain. Husband notes that patient has been tapered off Librium within the last several months. She has been on this medication for the last 6 years.  She has had extensive gastrointestinal workup with Dr. Leone Payor. She's underwent 2 endoscopies. No obvious source of epigastric pain has been discovered. However gabapentin seems to help her symptoms.  She's had a history of atypical chest pain in the past. She was seen by Dr. Johney Frame in 2011. Patient noted to have normal echocardiogram and normal stress testing in 2011.   Review of Systems  Chronic nausea, no vomiting Labile mood  Past Medical History  Diagnosis Date  . Anxiety and depression     ? bipolar  . GERD (gastroesophageal reflux disease)   . Hypertension   . TIA (transient ischemic attack) 2000    x 3   . Osteoarthritis (arthritis due to wear and tear of joints)   . IBS (irritable bowel syndrome)   . Segmental colitis     iscemic colitis 2009  . Allergy   . Cataract     bil cataracts removed  . Hiatal hernia   . Lymphocytic colitis 2012  . Hemorrhoids 2012   . Anxiety   . Depression   . Heart murmur   . Stroke     states 2 "'mini strokes" 1980    History   Social History  . Marital Status: Married    Spouse Name: N/A    Number of Children: 2  . Years of Education: N/A   Occupational History  . Retired    Social History Main Topics  . Smoking status: Former Games developer  . Smokeless tobacco: Never Used   Comment: stopped in 1962  . Alcohol Use: No  . Drug Use: No  . Sexually Active: Not on file   Other Topics Concern  . Not on file   Social  History Narrative   Occupation: retiredPatient is a former smoker. -stopped 1962Alcohol Use - no  Illicit Drug Use - no      Daily Caffeine Use  -1 cup daily         Past Surgical History  Procedure Date  . Bladder repair 1995  . Cholecystectomy 2004  . Hernia repair 1995  . Partial hysterectomy 1992    partial  . Ankle surgery 1999    right ankle fracture surgery  . Cataract extraction, bilateral   . Colonoscopy w/ biopsies and polypectomy  07/29/2011    internal hemorrhoids, lymphocytic colitis  . Esophagogastroduodenoscopy 07/29/2011    gastritis,hiatal hernia, tortuous esophagus  . Esophageal manometry     Normal  . Eus 06/03/2012    Procedure: UPPER ENDOSCOPIC ULTRASOUND (EUS) LINEAR;  Surgeon: Rachael Fee, MD;  Location: [facility];  Service: Endoscopy;  Laterality: N/A;    Family History  Problem Relation Age of Onset  . Diabetes Mother   . Diabetes Maternal Aunt   . Heart disease Father   . Diabetes Maternal Uncle   . Heart disease Paternal Aunt   . Colon cancer Neg Hx     Allergies  Allergen Reactions  . Alprazolam     REACTION: sleepy  .  Amlodipine Besylate     REACTION: swelling  . Aripiprazole   . Azithromycin   . Bupropion Hcl     REACTION: insomnia  . Cefaclor     REACTION: mouth blisters  . Celecoxib     REACTION: "drained"  . Cephalexin   . Ciprofloxacin   . Citalopram Hydrobromide     REACTION: memory loss  . Clonidine Hydrochloride     REACTION: headache, dizziness  . Desvenlafaxine   . Diphenoxylate-Atropine   . Divalproex Sodium     REACTION: nausea,tremors,fatigue  . Doxycycline   . Entex     REACTION: nausea, headache  . Est Estrogens-Methyltest     REACTION: headache  . Fluoxetine Hcl   . Furosemide   . Hydrochlorothiazide   . Hydrocodone-Acetaminophen     REACTION: headache  . Lamotrigine     REACTION: mood swing,chills, nausea  . Lansoprazole   . Lisinopril     REACTION: cough  . Lorazepam   . Meloxicam     REACTION:  nausea, diarrhea  . Methylphenidate Hcl   . Metronidazole   . Mirtazapine     REACTION: swelling  . Nabumetone     REACTION: nausea, headache  . Olmesartan Medoxomil   . Other     Alum and Mag Hydroxide Simeth(Solution) GI Cocktail. Diarrhea   . Oxycodone-Acetaminophen   . Pantoprazole Sodium   . Paroxetine     REACTION: tremor, insomnia, nausea  . Quetiapine   . Rabeprazole Sodium     REACTION: diarrhea  . Rofecoxib   . Sertraline Hcl     REACTION: exreme confusion  . Sumatriptan     REACTION: cardiologist says "no"  . Telithromycin     REACTION: "drained"  . Topamax     Confusion and mood swings  . Tramadol-Acetaminophen   . Venlafaxine     REACTION: hypertension, nausea, headache  . Verapamil     Headaches and hot flashes   . Vilazodone Hcl (Vilazodone Hcl)     Current Outpatient Prescriptions on File Prior to Visit  Medication Sig Dispense Refill  . amitriptyline (ELAVIL) 25 MG tablet Take 1 tablet (25 mg total) by mouth at bedtime. Start with 1/2 tablet first week  30 tablet  5  . cholestyramine light (PREVALITE) 4 G packet As directed and  as needed for diarrhea       . cyclobenzaprine (FLEXERIL) 5 MG tablet Take 5 mg by mouth at bedtime as needed.        . fish oil-omega-3 fatty acids 1000 MG capsule Take 2 g by mouth daily.      . hyoscyamine (ANASPAZ) 0.125 MG TBDP Place 0.125 mg under the tongue as needed.        . Loperamide HCl (IMODIUM PO) Take 2 tablets by mouth daily as needed.       . NON FORMULARY Take 1 tablet by mouth at bedtime. SNOOZE IN.       . ranitidine (ZANTAC) 150 MG tablet Take 1 tablet (150 mg total) by mouth 2 (two) times daily.  60 tablet  3  . spironolactone (ALDACTONE) 25 MG tablet Take 1 tablet (25 mg total) by mouth daily.  90 tablet  1  . sucralfate (CARAFATE) 1 G tablet Take 1 tablet (1 g total) by mouth 4 (four) times daily.  120 tablet  5  . valsartan (DIOVAN) 320 MG tablet Take 1 tablet (320 mg total) by mouth daily.  90 tablet  1   . DISCONTD: gabapentin (  NEURONTIN) 100 MG capsule Take one to two caps at night as directed  60 capsule  1  . metoprolol tartrate (LOPRESSOR) 25 MG tablet Take 0.5 tablets (12.5 mg total) by mouth 2 (two) times daily.  30 tablet  2  . mometasone (NASONEX) 50 MCG/ACT nasal spray Place 2 sprays into the nose daily.  17 g  5    BP 160/92  Pulse 71  Temp 98 F (36.7 C) (Oral)  Wt 181 lb (82.101 kg)  SpO2 95%  EKG shows normal sinus rhythm at 68 beats per minute. She has a right bundle branch block. No significant change when compared to EKG of 01/10/2010.     Objective:   Physical Exam  Constitutional: She is oriented to person, place, and time. She appears well-developed and well-nourished. No distress.  HENT:  Head: Normocephalic and atraumatic.  Mouth/Throat: Oropharynx is clear and moist.  Cardiovascular: Normal rate, regular rhythm and normal heart sounds.   No murmur heard. Pulmonary/Chest: Effort normal. She has no wheezes.  Abdominal: Soft. Bowel sounds are normal.  Musculoskeletal: She exhibits no edema.  Neurological: She is alert and oriented to person, place, and time. No cranial nerve deficit.  Skin: Skin is dry.  Psychiatric: She has a normal mood and affect. Her behavior is normal.          Assessment & Plan:

## 2012-07-08 ENCOUNTER — Other Ambulatory Visit: Payer: Medicare Other

## 2012-07-14 ENCOUNTER — Ambulatory Visit: Payer: Medicare Other | Admitting: Internal Medicine

## 2012-08-17 ENCOUNTER — Other Ambulatory Visit: Payer: Self-pay

## 2012-08-17 MED ORDER — CHOLESTYRAMINE LIGHT 4 G PO PACK: 4.0000 g | PACK | Freq: Every evening | ORAL | Status: DC | PRN

## 2012-09-06 ENCOUNTER — Ambulatory Visit: Payer: Medicare Other | Admitting: Internal Medicine

## 2012-09-10 ENCOUNTER — Other Ambulatory Visit: Payer: Self-pay | Admitting: Internal Medicine

## 2012-09-10 DIAGNOSIS — Z1231 Encounter for screening mammogram for malignant neoplasm of breast: Secondary | ICD-10-CM

## 2012-09-24 ENCOUNTER — Ambulatory Visit
Admission: RE | Admit: 2012-09-24 | Discharge: 2012-09-24 | Disposition: A | Discharge status: Home / Self Care | Payer: Medicare Other | Source: Ambulatory Visit | Attending: Internal Medicine | Admitting: Internal Medicine

## 2012-09-24 DIAGNOSIS — Z1231 Encounter for screening mammogram for malignant neoplasm of breast: Secondary | ICD-10-CM

## 2012-11-22 ENCOUNTER — Telehealth: Payer: Self-pay | Admitting: Internal Medicine

## 2012-11-22 MED ORDER — VALSARTAN 320 MG PO TABS: 320.0000 mg | ORAL_TABLET | Freq: Every day | ORAL | Status: DC

## 2012-11-22 MED ORDER — SPIRONOLACTONE 25 MG PO TABS: 25.0000 mg | ORAL_TABLET | Freq: Every day | ORAL | Status: DC

## 2012-11-22 NOTE — Telephone Encounter (Signed)
Refill- spironolactone 25mg  tab. Take one tablet by mouth once daily. Qty 90 last fill 9.23.13  Refill- diovan 320mg  tab. Take one tablet by mouth daily. Qty 30 last fill 11.22.13

## 2012-11-22 NOTE — Telephone Encounter (Signed)
Refills sent

## 2013-01-20 ENCOUNTER — Ambulatory Visit (INDEPENDENT_AMBULATORY_CARE_PROVIDER_SITE_OTHER): Payer: Medicare Other | Admitting: Physician Assistant

## 2013-01-20 ENCOUNTER — Encounter: Payer: Self-pay | Admitting: Physician Assistant

## 2013-01-20 ENCOUNTER — Ambulatory Visit: Payer: Medicare Other | Admitting: Internal Medicine

## 2013-01-20 VITALS — BP 129/80 | HR 100 | Ht 65.5 in | Wt 181.8 lb

## 2013-01-20 NOTE — Patient Instructions (Addendum)
FOLLOW UP AS NEEDED  DISCUSS WITH PSYCHIATRIST ABOUT AMITRIPTYLINE  CALL IF SYMPTOMS ARE NOT BETTER TO SCHEDULE FOR A MONITOR.  YOU HAVE BEEN GIVEN A PRESCRIPTION FOR LAB WORK WITH YOUR PCP TOMORROW (01/21/2013),  WITH RESULTS TO BE FAXED TO SCOTT WEAVER, PAC. [phone].

## 2013-01-20 NOTE — Progress Notes (Signed)
[address], [address] [place], [place]  [address] Phone: [phone], Fax:  [phone]  Date:  01/20/2013   ID:  Cynthia Gilmore, DOB 01-04-1934, MRN [id number]  PCP:  Thomos Lemons, DO  Primary Cardiologist/Electrophysiologist:  Dr. Hillis Range    History of Present Illness: Cynthia Gilmore is a 79 y.o. female who returns for evaluation of dizziness and elevated HRs.  She has a hx of HTN, HL, IBS, GERD, ischemic colitis.  Echo 10/09: EF 55-65%, mild focal basal septal hypertrophy, SAM, mild LAE, mild increased PASP.  Lexiscan Myoview 3/11:  EF 82%, no ischemia.  She has been under a lot of stress for the last 2 mos.  She had to put her dog to sleep.  She overall has not felt good for the last several weeks.  Notes increases in her HR at times into the 100s.  Also notes dizziness with standing.  No syncope or near syncope.  No chest pain, DOE, orthopnea, PND, edema.  She is NYHA Class II.  She notes a long hx of rapid palpitations with activity over years without change.    Labs (7/12):  Hgb 15.9 Labs (8/13):  K 3.7, creatinine 0.5, LDL 91, TSH 0.95  Wt Readings from Last 3 Encounters:  01/20/13 181 lb 12.8 oz (82.464 kg)  07/07/12 181 lb (82.101 kg)  06/29/12 180 lb 9.6 oz (81.92 kg)     Past Medical History  Diagnosis Date  . Anxiety and depression     ? bipolar  . GERD (gastroesophageal reflux disease)   . Hypertension   . TIA (transient ischemic attack) 2000    x 3   . Osteoarthritis (arthritis due to wear and tear of joints)   . IBS (irritable bowel syndrome)   . Segmental colitis     iscemic colitis 2009  . Allergy   . Cataract     bil cataracts removed  . Hiatal hernia   . Lymphocytic colitis 2012  . Hemorrhoids 2012   . Anxiety   . Depression   . Heart murmur   . Stroke     states 2 "'mini strokes" 1980    Current Outpatient Prescriptions  Medication Sig Dispense Refill  . amitriptyline (ELAVIL) 25 MG tablet Take 75 mg by mouth at  bedtime. 25mg  in the a.m. 50mg  p.m.      Marland Kitchen cholestyramine light (PREVALITE) 4 G packet Take 1 packet (4 g total) by mouth at bedtime as needed. As directed and  as needed for diarrhea  42 packet  11  . cyclobenzaprine (FLEXERIL) 5 MG tablet Take 5 mg by mouth at bedtime as needed.        . fish oil-omega-3 fatty acids 1000 MG capsule Take 2 g by mouth daily.      Marland Kitchen gabapentin (NEURONTIN) 100 MG capsule Take 100 mg by mouth as needed. Take two caps at bedtime and one cap in the AM      . hyoscyamine (ANASPAZ) 0.125 MG TBDP Place 0.125 mg under the tongue as needed.        . Loperamide HCl (IMODIUM PO) Take 2 tablets by mouth daily as needed.       . ranitidine (ZANTAC) 150 MG tablet Take 1 tablet (150 mg total) by mouth 2 (two) times daily.  60 tablet  3  . spironolactone (ALDACTONE) 25 MG tablet Take 1 tablet (25 mg total) by mouth daily.  90 tablet  1  . valsartan (DIOVAN) 320 MG  tablet Take 1 tablet (320 mg total) by mouth daily.  90 tablet  1   No current facility-administered medications for this visit.    Allergies:    Allergies  Allergen Reactions  . Alprazolam     REACTION: sleepy  . Amlodipine Besylate     REACTION: swelling  . Aripiprazole   . Azithromycin   . Bupropion Hcl     REACTION: insomnia  . Cefaclor     REACTION: mouth blisters  . Celecoxib     REACTION: "drained"  . Cephalexin   . Ciprofloxacin   . Citalopram Hydrobromide     REACTION: memory loss  . Clonidine Hydrochloride     REACTION: headache, dizziness  . Desvenlafaxine   . Diphenoxylate-Atropine   . Divalproex Sodium     REACTION: nausea,tremors,fatigue  . Doxycycline   . Entex     REACTION: nausea, headache  . Est Estrogens-Methyltest     REACTION: headache  . Fluoxetine Hcl   . Furosemide   . Hydrochlorothiazide   . Hydrocodone-Acetaminophen     REACTION: headache  . Lamotrigine     REACTION: mood swing,chills, nausea  . Lansoprazole   . Lisinopril     REACTION: cough  . Lorazepam   .  Meloxicam     REACTION: nausea, diarrhea  . Methylphenidate Hcl   . Metronidazole   . Mirtazapine     REACTION: swelling  . Nabumetone     REACTION: nausea, headache  . Olmesartan Medoxomil   . Other     Alum and Mag Hydroxide Simeth(Solution) GI Cocktail. Diarrhea   . Oxycodone-Acetaminophen   . Pantoprazole Sodium   . Paroxetine     REACTION: tremor, insomnia, nausea  . Quetiapine   . Rabeprazole Sodium     REACTION: diarrhea  . Rofecoxib   . Sertraline Hcl     REACTION: exreme confusion  . Sumatriptan     REACTION: cardiologist says "no"  . Telithromycin     REACTION: "drained"  . Topamax     Confusion and mood swings  . Tramadol-Acetaminophen   . Venlafaxine     REACTION: hypertension, nausea, headache  . Verapamil     Headaches and hot flashes   . Vilazodone Hcl (Vilazodone Hcl)     Social History:  The patient  reports that she has quit smoking. She has never used smokeless tobacco. She reports that she does not drink alcohol or use illicit drugs.   ROS:  Please see the history of present illness.   She has occasional BRBPR assoc with diarrhea from IBS.  No changes.   All other systems reviewed and negative.   PHYSICAL EXAM: VS:  BP 158/82  Pulse 83  Ht 5' 5.5" (1.664 m)  Wt 181 lb 12.8 oz (82.464 kg)  BMI 29.78 kg/m2  Filed Vitals:   01/20/13 1217 01/20/13 1218 01/20/13 1219 01/20/13 1220  BP: 138/83 138/76 138/78 129/80  Pulse: 86 92 94 100  Height:      Weight:         Well nourished, well developed, in no acute distress HEENT: normal Neck: no JVD Vascular:  No carotid bruits Cardiac:  normal S1, S2; RRR; no murmur Lungs:  clear to auscultation bilaterally, no wheezing, rhonchi or rales Abd: soft, nontender, no hepatomegaly Ext: no edema Skin: warm and dry Neuro:  CNs 2-12 intact, no focal abnormalities noted  EKG:  NSR, HR 83, normal axis, RBBB, no change from prior     ASSESSMENT AND  PLAN:  1. Dizziness:  She has some orthostatic  intolerance which may be a side effect of her Amitriptyline.  Her symptoms started after starting back on this.  She does not have a BP drop on her orthostatic VS today.  HR increases, but not significantly so.  We had a long discussion regarding further testing.  I suggest increase fluids and get compression stockings.  I also recommended discussing an alternative to amitriptyline with her psychiatrist.  2. Palpitations:  We discussed changing her amitriptyline first vs proceeding with an event monitor.  She prefers the former.  She will let us know if her symptoms persist with changing the amitriptyline or if they worsen.  At that point, would pursue an event monitor. 3. Hypertension:  Controlled.  Continue current therapy.  4. Disposition:  Follow up with Dr. Hillis Range as needed.   Signed, Tereso Newcomer, PA-C  12:12 PM 01/20/2013

## 2013-01-21 ENCOUNTER — Encounter: Payer: Self-pay | Admitting: Internal Medicine

## 2013-01-21 ENCOUNTER — Ambulatory Visit (INDEPENDENT_AMBULATORY_CARE_PROVIDER_SITE_OTHER): Payer: Medicare Other | Admitting: Internal Medicine

## 2013-01-21 VITALS — BP 142/80 | HR 92 | Temp 97.5°F | Wt 181.0 lb

## 2013-01-21 LAB — BASIC METABOLIC PANEL
BUN: 13 mg/dL (ref 6–23)
Calcium: 9.7 mg/dL (ref 8.4–10.5)
GFR: 78.42 mL/min (ref >60.00)
Glucose, Bld: 111 mg/dL — ABNORMAL HIGH (ref 70–99)
Sodium: 137 mEq/L (ref 135–145)

## 2013-01-21 LAB — CBC WITH DIFFERENTIAL/PLATELET
Basophils Absolute: 0 10*3/uL (ref 0.0–0.1)
Hemoglobin: 16 g/dL — ABNORMAL HIGH (ref 12.0–15.0)
Lymphocytes Relative: 37.5 % (ref 12.0–46.0)
Monocytes Relative: 10.3 % (ref 3.0–12.0)
Platelets: 222 10*3/uL (ref 150.0–400.0)
RDW: 13.2 % (ref 11.5–14.6)

## 2013-01-21 LAB — T4, FREE: Free T4: 0.89 ng/dL (ref 0.60–1.60)

## 2013-01-21 NOTE — Patient Instructions (Signed)
Please discuss lowering your amitriptyline dose with your psychiatrist

## 2013-01-21 NOTE — Progress Notes (Signed)
Subjective:    Patient ID: Cynthia Gilmore, female    DOB: 10/01/1934, 79 y.o.   MRN: [id number]  HPI  79 year old with history of depression/anxiety, hypertension and palpitations for followup. Patient recently seen by Tereso Newcomer regarding palpitations and dizziness. Her symptoms have been more noticeable over last 3 months. During this time, her psychiatrist increased her amitriptyline dose to 25 mg in the morning and 50 mg at bedtime. Patient reports her depressive symptoms have improved.  She has intermittent dizziness. Her symptoms are not positional. Symptoms worse after prolonged sitting.  Review of Systems Negative for hearing loss or tinnitus  Past Medical History  Diagnosis Date  . Anxiety and depression     ? bipolar  . GERD (gastroesophageal reflux disease)   . Hypertension   . TIA (transient ischemic attack) 2000    x 3   . Osteoarthritis (arthritis due to wear and tear of joints)   . IBS (irritable bowel syndrome)   . Segmental colitis     iscemic colitis 2009  . Allergy   . Cataract     bil cataracts removed  . Hiatal hernia   . Lymphocytic colitis 2012  . Hemorrhoids 2012   . Anxiety   . Depression   . Heart murmur   . Stroke     states 2 "'mini strokes" 1980    History   Social History  . Marital Status: Married    Spouse Name: N/A    Number of Children: 2  . Years of Education: N/A   Occupational History  . Retired    Social History Main Topics  . Smoking status: Former Games developer  . Smokeless tobacco: Never Used     Comment: stopped in 1962  . Alcohol Use: No  . Drug Use: No  . Sexually Active: Not on file   Other Topics Concern  . Not on file   Social History Narrative   Occupation: retired   Patient is a former smoker. -stopped 1962   Alcohol Use - no     Illicit Drug Use - no         Daily Caffeine Use  -1 cup daily               Past Surgical History  Procedure Laterality Date  . Bladder repair  1995  . Cholecystectomy   2004  . Hernia repair  1995  . Partial hysterectomy  1992    partial  . Ankle surgery  1999    right ankle fracture surgery  . Cataract extraction, bilateral    . Colonoscopy w/ biopsies and polypectomy   07/29/2011    internal hemorrhoids, lymphocytic colitis  . Esophagogastroduodenoscopy  07/29/2011    gastritis,hiatal hernia, tortuous esophagus  . Esophageal manometry      Normal  . Eus  06/03/2012    Procedure: UPPER ENDOSCOPIC ULTRASOUND (EUS) LINEAR;  Surgeon: Rachael Fee, MD;  Location: [facility];  Service: Endoscopy;  Laterality: N/A;    Family History  Problem Relation Age of Onset  . Diabetes Mother   . Diabetes Maternal Aunt   . Heart disease Father   . Diabetes Maternal Uncle   . Heart disease Paternal Aunt   . Colon cancer Neg Hx     Allergies  Allergen Reactions  . Alprazolam     REACTION: sleepy  . Amlodipine Besylate     REACTION: swelling  . Aripiprazole   . Azithromycin   . Bupropion Hcl  REACTION: insomnia  . Cefaclor     REACTION: mouth blisters  . Celecoxib     REACTION: "drained"  . Cephalexin   . Ciprofloxacin   . Citalopram Hydrobromide     REACTION: memory loss  . Clonidine Hydrochloride     REACTION: headache, dizziness  . Desvenlafaxine   . Diphenoxylate-Atropine   . Divalproex Sodium     REACTION: nausea,tremors,fatigue  . Doxycycline   . Entex     REACTION: nausea, headache  . Est Estrogens-Methyltest     REACTION: headache  . Fluoxetine Hcl   . Furosemide   . Hydrochlorothiazide   . Hydrocodone-Acetaminophen     REACTION: headache  . Lamotrigine     REACTION: mood swing,chills, nausea  . Lansoprazole   . Lisinopril     REACTION: cough  . Lorazepam   . Meloxicam     REACTION: nausea, diarrhea  . Methylphenidate Hcl   . Metronidazole   . Mirtazapine     REACTION: swelling  . Nabumetone     REACTION: nausea, headache  . Olmesartan Medoxomil   . Other     Alum and Mag Hydroxide Simeth(Solution) GI  Cocktail. Diarrhea   . Oxycodone-Acetaminophen   . Pantoprazole Sodium   . Paroxetine     REACTION: tremor, insomnia, nausea  . Quetiapine   . Rabeprazole Sodium     REACTION: diarrhea  . Rofecoxib   . Sertraline Hcl     REACTION: exreme confusion  . Sumatriptan     REACTION: cardiologist says "no"  . Telithromycin     REACTION: "drained"  . Topamax     Confusion and mood swings  . Tramadol-Acetaminophen   . Venlafaxine     REACTION: hypertension, nausea, headache  . Verapamil     Headaches and hot flashes   . Vilazodone Hcl (Vilazodone Hcl)     Current Outpatient Prescriptions on File Prior to Visit  Medication Sig Dispense Refill  . amitriptyline (ELAVIL) 25 MG tablet Take 75 mg by mouth at bedtime. 25mg  in the a.m. 50mg  p.m.      Marland Kitchen cholestyramine light (PREVALITE) 4 G packet Take 1 packet (4 g total) by mouth at bedtime as needed. As directed and  as needed for diarrhea  42 packet  11  . cyclobenzaprine (FLEXERIL) 5 MG tablet Take 5 mg by mouth at bedtime as needed.        . fish oil-omega-3 fatty acids 1000 MG capsule Take 2 g by mouth daily.      Marland Kitchen gabapentin (NEURONTIN) 100 MG capsule Take 100 mg by mouth as needed. Take two caps at bedtime and one cap in the AM      . hyoscyamine (ANASPAZ) 0.125 MG TBDP Place 0.125 mg under the tongue as needed.        . Loperamide HCl (IMODIUM PO) Take 2 tablets by mouth daily as needed.       . ranitidine (ZANTAC) 150 MG tablet Take 1 tablet (150 mg total) by mouth 2 (two) times daily.  60 tablet  3  . spironolactone (ALDACTONE) 25 MG tablet Take 1 tablet (25 mg total) by mouth daily.  90 tablet  1  . valsartan (DIOVAN) 320 MG tablet Take 1 tablet (320 mg total) by mouth daily.  90 tablet  1   No current facility-administered medications on file prior to visit.    BP 142/80  Pulse 92  Temp(Src) 97.5 F (36.4 C) (Oral)  Wt 181 lb (82.101 kg)  BMI  29.65 kg/m2       Objective:   Physical Exam  Constitutional: She is  oriented to person, place, and time. She appears well-developed and well-nourished.  HENT:  Head: Normocephalic and atraumatic.  Eyes: EOM are normal. Pupils are equal, round, and reactive to light.  Cardiovascular: Normal rate, regular rhythm and normal heart sounds.   Pulmonary/Chest: Effort normal and breath sounds normal. She has no wheezes.  Musculoskeletal: She exhibits no edema.  Neurological: She is alert and oriented to person, place, and time. No cranial nerve deficit.  Psychiatric: She has a normal mood and affect. Her behavior is normal.          Assessment & Plan:

## 2013-01-21 NOTE — Assessment & Plan Note (Signed)
Blood pressure is well-controlled. Continue Diovan 320 mg and spironolactone 25 mg once daily. Monitor electrolytes and kidney function. BP: 142/80 mmHg

## 2013-01-21 NOTE — Assessment & Plan Note (Signed)
Patient could not tolerate metoprolol. She reports it caused significant fatigue and stomach upset. Patient recently seen by cardiology for palpitations and dizziness. Her symptoms may be secondary to higher dose of amitriptyline. Patient advised to discuss lowering dose with her psychiatrist.

## 2013-02-07 ENCOUNTER — Telehealth: Payer: Self-pay | Admitting: Physician Assistant

## 2013-02-07 NOTE — Telephone Encounter (Signed)
New problem   Dr Alben Spittle had order bloodwork for pt to have done at Dr Artist Pais office and she was wanting to know if you have the results.

## 2013-02-07 NOTE — Telephone Encounter (Signed)
cb pt to let he know that yes we did rcv the blood work 2/28 that was done at PCP Dr. Artist Pais. pt asked me what the results stated, so gave pt results again, though PCP office already had; pt verbalized understanding to lab results

## 2013-02-07 NOTE — Telephone Encounter (Signed)
cb pt to let he know that yes we did rcv the blood work 2/28 that was done at PCP Dr. Yoo. pt asked me what the results stated, so gave pt results again, though PCP office already had; pt verbalized understanding to lab results 

## 2013-02-18 ENCOUNTER — Other Ambulatory Visit (INDEPENDENT_AMBULATORY_CARE_PROVIDER_SITE_OTHER): Payer: Medicare Other

## 2013-02-18 DIAGNOSIS — E1310 Other specified diabetes mellitus with ketoacidosis without coma: Secondary | ICD-10-CM

## 2013-02-18 DIAGNOSIS — E1110 Type 2 diabetes mellitus with ketoacidosis without coma: Secondary | ICD-10-CM

## 2013-03-18 ENCOUNTER — Ambulatory Visit: Payer: Medicare Other | Admitting: Internal Medicine

## 2013-03-30 ENCOUNTER — Ambulatory Visit: Payer: Medicare Other | Admitting: Internal Medicine

## 2013-04-05 ENCOUNTER — Ambulatory Visit (INDEPENDENT_AMBULATORY_CARE_PROVIDER_SITE_OTHER): Payer: Medicare Other | Admitting: Internal Medicine

## 2013-04-05 ENCOUNTER — Encounter: Payer: Self-pay | Admitting: Internal Medicine

## 2013-04-05 VITALS — BP 128/84 | HR 88 | Temp 98.1°F | Wt 182.0 lb

## 2013-04-05 DIAGNOSIS — E119 Type 2 diabetes mellitus without complications: Secondary | ICD-10-CM

## 2013-04-05 MED ORDER — SPIRONOLACTONE 25 MG PO TABS: 25.0000 mg | ORAL_TABLET | Freq: Every day | ORAL | Status: DC

## 2013-04-05 MED ORDER — VALSARTAN 320 MG PO TABS: 320.0000 mg | ORAL_TABLET | Freq: Every day | ORAL | Status: DC

## 2013-04-05 NOTE — Progress Notes (Signed)
Subjective:    Patient ID: Cynthia Gilmore, female    DOB: 1934/03/12, 79 y.o.   MRN: [id number]  HPI  79 year old white female with history of depression/anxiety, hypertension and palpitations for followup. Patient reports palpitations are less would use a lower dose of amitriptyline.  Previous blood work showed worsening A1c. Patient followed for borderline type 2 diabetes. Her husband reports she has "sweet tooth".  Patient has tried to reduce her carbohydrate intake. She has also started regular walking program.  Review of Systems    Negative for polyuria or polydipsia  Past Medical History  Diagnosis Date  . Anxiety and depression     ? bipolar  . GERD (gastroesophageal reflux disease)   . Hypertension   . TIA (transient ischemic attack) 2000    x 3   . Osteoarthritis (arthritis due to wear and tear of joints)   . IBS (irritable bowel syndrome)   . Segmental colitis     iscemic colitis 2009  . Allergy   . Cataract     bil cataracts removed  . Hiatal hernia   . Lymphocytic colitis 2012  . Hemorrhoids 2012   . Anxiety   . Depression   . Heart murmur   . Stroke     states 2 "'mini strokes" 1980    History   Social History  . Marital Status: Married    Spouse Name: N/A    Number of Children: 2  . Years of Education: N/A   Occupational History  . Retired    Social History Main Topics  . Smoking status: Former Games developer  . Smokeless tobacco: Never Used     Comment: stopped in 1962  . Alcohol Use: No  . Drug Use: No  . Sexually Active: Not on file   Other Topics Concern  . Not on file   Social History Narrative   Occupation: retired   Patient is a former smoker. -stopped 1962   Alcohol Use - no     Illicit Drug Use - no         Daily Caffeine Use  -1 cup daily               Past Surgical History  Procedure Laterality Date  . Bladder repair  1995  . Cholecystectomy  2004  . Hernia repair  1995  . Partial hysterectomy  1992    partial  . Ankle  surgery  1999    right ankle fracture surgery  . Cataract extraction, bilateral    . Colonoscopy w/ biopsies and polypectomy   07/29/2011    internal hemorrhoids, lymphocytic colitis  . Esophagogastroduodenoscopy  07/29/2011    gastritis,hiatal hernia, tortuous esophagus  . Esophageal manometry      Normal  . Eus  06/03/2012    Procedure: UPPER ENDOSCOPIC ULTRASOUND (EUS) LINEAR;  Surgeon: Rachael Fee, MD;  Location: [facility];  Service: Endoscopy;  Laterality: N/A;    Family History  Problem Relation Age of Onset  . Diabetes Mother   . Diabetes Maternal Aunt   . Heart disease Father   . Diabetes Maternal Uncle   . Heart disease Paternal Aunt   . Colon cancer Neg Hx     Allergies  Allergen Reactions  . Alprazolam     REACTION: sleepy  . Amlodipine Besylate     REACTION: swelling  . Aripiprazole   . Azithromycin   . Bupropion Hcl     REACTION: insomnia  . Cefaclor  REACTION: mouth blisters  . Celecoxib     REACTION: "drained"  . Cephalexin   . Ciprofloxacin   . Citalopram Hydrobromide     REACTION: memory loss  . Clonidine Hydrochloride     REACTION: headache, dizziness  . Desvenlafaxine   . Diphenoxylate-Atropine   . Divalproex Sodium     REACTION: nausea,tremors,fatigue  . Doxycycline   . Entex     REACTION: nausea, headache  . Est Estrogens-Methyltest     REACTION: headache  . Fluoxetine Hcl   . Furosemide   . Hydrochlorothiazide   . Hydrocodone-Acetaminophen     REACTION: headache  . Lamotrigine     REACTION: mood swing,chills, nausea  . Lansoprazole   . Lisinopril     REACTION: cough  . Lorazepam   . Meloxicam     REACTION: nausea, diarrhea  . Methylphenidate Hcl   . Metronidazole   . Mirtazapine     REACTION: swelling  . Nabumetone     REACTION: nausea, headache  . Olmesartan Medoxomil   . Other     Alum and Mag Hydroxide Simeth(Solution) GI Cocktail. Diarrhea   . Oxycodone-Acetaminophen   . Pantoprazole Sodium   . Paroxetine      REACTION: tremor, insomnia, nausea  . Quetiapine   . Rabeprazole Sodium     REACTION: diarrhea  . Rofecoxib   . Sertraline Hcl     REACTION: exreme confusion  . Sumatriptan     REACTION: cardiologist says "no"  . Telithromycin     REACTION: "drained"  . Topamax     Confusion and mood swings  . Tramadol-Acetaminophen   . Venlafaxine     REACTION: hypertension, nausea, headache  . Verapamil     Headaches and hot flashes   . Vilazodone Hcl (Vilazodone Hcl)     Current Outpatient Prescriptions on File Prior to Visit  Medication Sig Dispense Refill  . amitriptyline (ELAVIL) 25 MG tablet Take 75 mg by mouth at bedtime. 25mg  in the a.m. 50mg  p.m.      Marland Kitchen cholestyramine light (PREVALITE) 4 G packet Take 1 packet (4 g total) by mouth at bedtime as needed. As directed and  as needed for diarrhea  42 packet  11  . cyclobenzaprine (FLEXERIL) 5 MG tablet Take 5 mg by mouth at bedtime as needed.        . fish oil-omega-3 fatty acids 1000 MG capsule Take 2 g by mouth daily.      . hyoscyamine (ANASPAZ) 0.125 MG TBDP Place 0.125 mg under the tongue as needed.        . Loperamide HCl (IMODIUM PO) Take 2 tablets by mouth daily as needed.       . ranitidine (ZANTAC) 150 MG tablet Take 1 tablet (150 mg total) by mouth 2 (two) times daily.  60 tablet  3   No current facility-administered medications on file prior to visit.    BP 128/84  Pulse 88  Temp(Src) 98.1 F (36.7 C) (Oral)  Wt 182 lb (82.555 kg)  BMI 29.82 kg/m2    Objective:   Physical Exam  Constitutional: She is oriented to person, place, and time. She appears well-developed and well-nourished.  Cardiovascular: Normal rate, regular rhythm and normal heart sounds.   Pulmonary/Chest: Effort normal. She has no wheezes.  Neurological: She is alert and oriented to person, place, and time. No cranial nerve deficit.  Psychiatric: She has a normal mood and affect. Her behavior is normal.  Assessment & Plan:

## 2013-04-05 NOTE — Patient Instructions (Addendum)
Please complete the following lab tests before your next follow up appointment: BMET, A1c - 250.00 IBC panel, ferritin level - 250.00 Limit your carbohydrate intake to 30 grams per meal.  Exercise daily.

## 2013-04-05 NOTE — Assessment & Plan Note (Signed)
79 year old female with previous history of borderline diabetes has progressed to type II diabetes.  Patient has history of intolerance to multiple medications. I suggest significant changes in lifestyle/diet. Reassess in 3 months. If worsening A1c, consider starting oral agents.

## 2013-06-21 ENCOUNTER — Other Ambulatory Visit: Payer: Self-pay | Admitting: *Deleted

## 2013-06-21 MED ORDER — RANITIDINE HCL 150 MG PO TABS: 150.0000 mg | ORAL_TABLET | Freq: Two times a day (BID) | ORAL | Status: DC

## 2013-07-06 ENCOUNTER — Other Ambulatory Visit (INDEPENDENT_AMBULATORY_CARE_PROVIDER_SITE_OTHER): Payer: Medicare Other

## 2013-07-06 DIAGNOSIS — E1059 Type 1 diabetes mellitus with other circulatory complications: Secondary | ICD-10-CM

## 2013-07-07 LAB — BASIC METABOLIC PANEL
BUN: 15 mg/dL (ref 6–23)
CO2: 23 mEq/L (ref 19–32)
Chloride: 102 mEq/L (ref 96–112)
Glucose, Bld: 145 mg/dL — ABNORMAL HIGH (ref 70–99)
Potassium: 4 mEq/L (ref 3.5–5.1)
Sodium: 135 mEq/L (ref 135–145)

## 2013-07-07 LAB — FERRITIN: Ferritin: 136.8 ng/mL (ref 10.0–291.0)

## 2013-07-07 LAB — IBC PANEL
Saturation Ratios: 31.5 % (ref 20.0–50.0)
Transferrin: 251.9 mg/dL (ref 212.0–360.0)

## 2013-07-08 ENCOUNTER — Other Ambulatory Visit: Payer: Medicare Other

## 2013-07-15 ENCOUNTER — Ambulatory Visit (INDEPENDENT_AMBULATORY_CARE_PROVIDER_SITE_OTHER): Payer: Medicare Other | Admitting: Internal Medicine

## 2013-07-15 ENCOUNTER — Encounter: Payer: Self-pay | Admitting: Internal Medicine

## 2013-07-15 VITALS — BP 124/76 | HR 98 | Temp 97.7°F | Wt 179.0 lb

## 2013-07-15 DIAGNOSIS — Z23 Encounter for immunization: Secondary | ICD-10-CM

## 2013-07-15 DIAGNOSIS — R05 Cough: Secondary | ICD-10-CM | POA: Insufficient documentation

## 2013-07-15 DIAGNOSIS — E119 Type 2 diabetes mellitus without complications: Secondary | ICD-10-CM

## 2013-07-15 DIAGNOSIS — I10 Essential (primary) hypertension: Secondary | ICD-10-CM

## 2013-07-15 DIAGNOSIS — E1165 Type 2 diabetes mellitus with hyperglycemia: Secondary | ICD-10-CM

## 2013-07-15 MED ORDER — FLUTICASONE PROPIONATE 50 MCG/ACT NA SUSP: 2.0000 | Freq: Every day | NASAL | Status: DC

## 2013-07-15 MED ORDER — FEXOFENADINE HCL 180 MG PO TABS: 180.0000 mg | ORAL_TABLET | Freq: Every day | ORAL | Status: DC

## 2013-07-15 MED ORDER — METFORMIN HCL ER 500 MG PO TB24: 500.0000 mg | ORAL_TABLET | Freq: Every day | ORAL | Status: DC

## 2013-07-15 MED ORDER — GLUCOSE BLOOD VI STRP: 1.0000 | ORAL_STRIP | Freq: Every day | Status: DC

## 2013-07-15 NOTE — Assessment & Plan Note (Addendum)
A1c slightly worse. Start metformin XR 500 mg once daily.  The patient may have difficulty tolerating metformin due to history of IBS and history of multiple intolerances to medication. Continue carb modified diet and stressed importance of avoiding concentrated sweets. Provided glucometer with instruction and blood sugar log. Lab Results  Component Value Date   HGBA1C 7.4* 07/06/2013   Lab Results  Component Value Date   CREATININE 0.9 07/06/2013   Her foot exam is normal. She has slight scaliness to skin on right foot. Patient advised to use Lamisil cream over-the-counter. Patient updated with pneumococcal vaccine.

## 2013-07-15 NOTE — Assessment & Plan Note (Addendum)
Patient complains of chronic cough. She has symptoms of allergic rhinitis. Treat with over-the-counter fexofenadine 180 mg once daily. Also use intranasal this steroids (Flonase) 2 sprays each nostril once daily.  Her last chest x-ray was completed in February of 2011. He was unremarkable. If persistent cough, consider repeat chest x-ray.

## 2013-07-15 NOTE — Assessment & Plan Note (Signed)
Well controlled.  No change in medication. BP: 124/76 mmHg   Lab Results  Component Value Date   NA 135 07/06/2013   K 4.0 07/06/2013   CL 102 07/06/2013   CO2 23 07/06/2013   Lab Results  Component Value Date   CREATININE 0.9 07/06/2013

## 2013-07-15 NOTE — Progress Notes (Signed)
Subjective:    Patient ID: Cynthia Gilmore, female    DOB: 1934-10-15, 79 y.o.   MRN: [id number]  HPI  79 year old white female with history of depression/anxiety, hypertension and type 2 diabetes for followup. Patient reports decreasing her carbohydrate intake. She usually limits herself to approximately 90 carbohydrates per day. However, she still enjoys sweets. She is exercising twice daily. Unfortunately her A1c is slightly worse at 7.4.  Patient also complains of chronic cough. She has associated nasal congestion that is worse in the evening. She denies any discolored nasal discharge. She denies any facial pain or fever. She has been using over-the-counter cough medication.  Review of Systems Negative for chest pain, no shortness of breath  Past Medical History  Diagnosis Date  . Anxiety and depression     ? bipolar  . GERD (gastroesophageal reflux disease)   . Hypertension   . TIA (transient ischemic attack) 2000    x 3   . Osteoarthritis (arthritis due to wear and tear of joints)   . IBS (irritable bowel syndrome)   . Segmental colitis     iscemic colitis 2009  . Allergy   . Cataract     bil cataracts removed  . Hiatal hernia   . Lymphocytic colitis 2012  . Hemorrhoids 2012   . Anxiety   . Depression   . Heart murmur   . Stroke     states 2 "'mini strokes" 1980    History   Social History  . Marital Status: Married    Spouse Name: N/A    Number of Children: 2  . Years of Education: N/A   Occupational History  . Retired    Social History Main Topics  . Smoking status: Former Games developer  . Smokeless tobacco: Never Used     Comment: stopped in 1962  . Alcohol Use: No  . Drug Use: No  . Sexual Activity: Not on file   Other Topics Concern  . Not on file   Social History Narrative   Occupation: retired   Patient is a former smoker. -stopped 1962   Alcohol Use - no     Illicit Drug Use - no         Daily Caffeine Use  -1 cup daily               Past  Surgical History  Procedure Laterality Date  . Bladder repair  1995  . Cholecystectomy  2004  . Hernia repair  1995  . Partial hysterectomy  1992    partial  . Ankle surgery  1999    right ankle fracture surgery  . Cataract extraction, bilateral    . Colonoscopy w/ biopsies and polypectomy   07/29/2011    internal hemorrhoids, lymphocytic colitis  . Esophagogastroduodenoscopy  07/29/2011    gastritis,hiatal hernia, tortuous esophagus  . Esophageal manometry      Normal  . Eus  06/03/2012    Procedure: UPPER ENDOSCOPIC ULTRASOUND (EUS) LINEAR;  Surgeon: Rachael Fee, MD;  Location: [facility];  Service: Endoscopy;  Laterality: N/A;    Family History  Problem Relation Age of Onset  . Diabetes Mother   . Diabetes Maternal Aunt   . Heart disease Father   . Diabetes Maternal Uncle   . Heart disease Paternal Aunt   . Colon cancer Neg Hx     Allergies  Allergen Reactions  . Alprazolam     REACTION: sleepy  . Amlodipine Besylate     REACTION:  swelling  . Aripiprazole   . Azithromycin   . Bupropion Hcl     REACTION: insomnia  . Cefaclor     REACTION: mouth blisters  . Celecoxib     REACTION: "drained"  . Cephalexin   . Ciprofloxacin   . Citalopram Hydrobromide     REACTION: memory loss  . Clonidine Hydrochloride     REACTION: headache, dizziness  . Desvenlafaxine   . Diphenoxylate-Atropine   . Divalproex Sodium     REACTION: nausea,tremors,fatigue  . Doxycycline   . Entex     REACTION: nausea, headache  . Est Estrogens-Methyltest     REACTION: headache  . Fluoxetine Hcl   . Furosemide   . Hydrochlorothiazide   . Hydrocodone-Acetaminophen     REACTION: headache  . Lamotrigine     REACTION: mood swing,chills, nausea  . Lansoprazole   . Lisinopril     REACTION: cough  . Lorazepam   . Meloxicam     REACTION: nausea, diarrhea  . Methylphenidate Hcl   . Metronidazole   . Mirtazapine     REACTION: swelling  . Nabumetone     REACTION: nausea, headache  .  Olmesartan Medoxomil   . Other     Alum and Mag Hydroxide Simeth(Solution) GI Cocktail. Diarrhea   . Oxycodone-Acetaminophen   . Pantoprazole Sodium   . Paroxetine     REACTION: tremor, insomnia, nausea  . Quetiapine   . Rabeprazole Sodium     REACTION: diarrhea  . Rofecoxib   . Sertraline Hcl     REACTION: exreme confusion  . Sumatriptan     REACTION: cardiologist says "no"  . Telithromycin     REACTION: "drained"  . Topamax     Confusion and mood swings  . Tramadol-Acetaminophen   . Venlafaxine     REACTION: hypertension, nausea, headache  . Verapamil     Headaches and hot flashes   . Vilazodone Hcl [Vilazodone Hcl]     Current Outpatient Prescriptions on File Prior to Visit  Medication Sig Dispense Refill  . amitriptyline (ELAVIL) 25 MG tablet Take 75 mg by mouth at bedtime. 25mg  in the a.m. 50mg  p.m.      Marland Kitchen cholestyramine light (PREVALITE) 4 G packet Take 1 packet (4 g total) by mouth at bedtime as needed. As directed and  as needed for diarrhea  42 packet  11  . cyclobenzaprine (FLEXERIL) 5 MG tablet Take 5 mg by mouth at bedtime as needed.        . fish oil-omega-3 fatty acids 1000 MG capsule Take 2 g by mouth daily.      . hyoscyamine (ANASPAZ) 0.125 MG TBDP Place 0.125 mg under the tongue as needed.        . Loperamide HCl (IMODIUM PO) Take 2 tablets by mouth daily as needed.       . ranitidine (ZANTAC) 150 MG tablet Take 1 tablet (150 mg total) by mouth 2 (two) times daily.  60 tablet  3  . spironolactone (ALDACTONE) 25 MG tablet Take 1 tablet (25 mg total) by mouth daily.  90 tablet  1  . valsartan (DIOVAN) 320 MG tablet Take 1 tablet (320 mg total) by mouth daily.  90 tablet  1   No current facility-administered medications on file prior to visit.    BP 124/76  Pulse 98  Temp(Src) 97.7 F (36.5 C) (Oral)  Wt 179 lb (81.194 kg)  BMI 29.32 kg/m2       Objective:   Physical  Exam  Constitutional: She is oriented to person, place, and time. She appears  well-developed and well-nourished. No distress.  HENT:  Head: Normocephalic and atraumatic.  Right Ear: External ear normal.  Left Ear: External ear normal.  Mouth/Throat: Oropharynx is clear and moist.  Signs of postnasal drip, mild paleness to nasal mucosa bilaterally  Cardiovascular: Normal rate, regular rhythm and normal heart sounds.   No murmur heard. Pulmonary/Chest: Effort normal and breath sounds normal. She has no wheezes.  Abdominal: Soft. Bowel sounds are normal.  Musculoskeletal: She exhibits no edema.  Neurological: She is alert and oriented to person, place, and time. No cranial nerve deficit.  Skin: Skin is warm and dry.  Psychiatric: She has a normal mood and affect. Her behavior is normal.          Assessment & Plan:

## 2013-07-15 NOTE — Patient Instructions (Addendum)
Take Fexofenadine 180 mg (over the counter) once daily Bring your glucometer or blood sugar log to your next follow up appointment Avoid concentrated sweets

## 2013-07-19 ENCOUNTER — Telehealth: Payer: Self-pay | Admitting: Internal Medicine

## 2013-07-19 MED ORDER — GLUCOSE BLOOD VI STRP: 1.0000 | ORAL_STRIP | Freq: Every day | Status: DC

## 2013-07-19 NOTE — Telephone Encounter (Signed)
Pt unable to understand how the meter works.  Gave pt the Accu-chek meter and called in new test strips

## 2013-07-19 NOTE — Telephone Encounter (Signed)
Patient would like you to call her back about her FREESTYLE METER. She will not be home between 1:30 - 3:30.

## 2013-08-30 ENCOUNTER — Telehealth: Payer: Self-pay | Admitting: Internal Medicine

## 2013-08-30 NOTE — Telephone Encounter (Signed)
Patient Information:  [name] Name: Randa Evens  Phone: [phone]  Patient: Cynthia Gilmore  Gender: Female  DOB: 09/13/1934  Age: 79 Years  PCP: Artist Pais [facility]uro) (Adults only)  Office Follow Up:  Does the office need to follow up with this patient?: No  Instructions For The Office: N/A   Symptoms  Reason For Call & Symptoms: PT calling regarding left ear pain that shoots up from ear to eye and then down to nose. Comes and goes quickly. Pt recently put on nose spray and Allegra for allergies. Pt thinks it may be related to allergies.  Reviewed Health History In EMR: Yes  Reviewed Medications In EMR: Yes  Reviewed Allergies In EMR: Yes  Reviewed Surgeries / Procedures: Yes  Date of Onset of Symptoms: 08/29/2013  Treatments Tried: 3 Ibuprofen at hs on 08/29/13  Treatments Tried Worked: Yes  Guideline(s) Used:  Earache  Disposition Per Guideline:   See Today in Office  Reason For Disposition Reached:   Diabetes mellitus or a weak immune system (e.g., HIV positive, cancer chemotherapy, transplant patient)  Advice Given:  N/A  Patient Will Follow Care Advice:  YES  Appointment Scheduled:  08/31/2013 09:45:00 Appointment Scheduled Provider:  Evelena Peat (Family Practice)

## 2013-08-31 ENCOUNTER — Ambulatory Visit (INDEPENDENT_AMBULATORY_CARE_PROVIDER_SITE_OTHER): Payer: Medicare Other | Admitting: Family Medicine

## 2013-08-31 ENCOUNTER — Encounter: Payer: Self-pay | Admitting: Family Medicine

## 2013-08-31 VITALS — BP 128/72 | HR 86 | Temp 97.6°F | Wt 172.0 lb

## 2013-08-31 DIAGNOSIS — Z23 Encounter for immunization: Secondary | ICD-10-CM

## 2013-08-31 DIAGNOSIS — R51 Headache: Secondary | ICD-10-CM

## 2013-08-31 NOTE — Patient Instructions (Signed)
Otalgia  The most common reason for this in children is an infection of the middle ear. Pain from the middle ear is usually caused by a build-up of fluid and pressure behind the eardrum. Pain from an earache can be sharp, dull, or burning. The pain may be temporary or constant. The middle ear is connected to the nasal passages by a short narrow tube called the Eustachian tube. The Eustachian tube allows fluid to drain out of the middle ear, and helps keep the pressure in your ear equalized.  CAUSES   A cold or allergy can block the Eustachian tube with inflammation and the build-up of secretions. This is especially likely in small children, because their Eustachian tube is shorter and more horizontal. When the Eustachian tube closes, the normal flow of fluid from the middle ear is stopped. Fluid can accumulate and cause stuffiness, pain, hearing loss, and an ear infection if germs start growing in this area.  SYMPTOMS   The symptoms of an ear infection may include fever, ear pain, fussiness, increased crying, and irritability. Many children will have temporary and minor hearing loss during and right after an ear infection. Permanent hearing loss is rare, but the risk increases the more infections a child has. Other causes of ear pain include retained water in the outer ear canal from swimming and bathing.  Ear pain in adults is less likely to be from an ear infection. Ear pain may be referred from other locations. Referred pain may be from the joint between your jaw and the skull. It may also come from a tooth problem or problems in the neck. Other causes of ear pain include:   A foreign body in the ear.   Outer ear infection.   Sinus infections.   Impacted ear wax.   Ear injury.   Arthritis of the jaw or TMJ problems.   Middle ear infection.   Tooth infections.   Sore throat with pain to the ears.  DIAGNOSIS   Your caregiver can usually make the diagnosis by examining you. Sometimes other special studies,  including x-rays and lab work may be necessary.  TREATMENT    If antibiotics were prescribed, use them as directed and finish them even if you or your child's symptoms seem to be improved.   Sometimes PE tubes are needed in children. These are little plastic tubes which are put into the eardrum during a simple surgical procedure. They allow fluid to drain easier and allow the pressure in the middle ear to equalize. This helps relieve the ear pain caused by pressure changes.  HOME CARE INSTRUCTIONS    Only take over-the-counter or prescription medicines for pain, discomfort, or fever as directed by your caregiver. DO NOT GIVE CHILDREN ASPIRIN because of the association of Reye's Syndrome in children taking aspirin.   Use a cold pack applied to the outer ear for 15-20 minutes, 3-4 times per day or as needed may reduce pain. Do not apply ice directly to the skin. You may cause frost bite.   Over-the-counter ear drops used as directed may be effective. Your caregiver may sometimes prescribe ear drops.   Resting in an upright position may help reduce pressure in the middle ear and relieve pain.   Ear pain caused by rapidly descending from high altitudes can be relieved by swallowing or chewing gum. Allowing infants to suck on a bottle during airplane travel can help.   Do not smoke in the house or near children. If you are   relief is not obtained with medicine.  You or your child's symptoms (pain, fever, or irritability) do not improve within 24 to 48 hours or as instructed.  Severe pain suddenly stops hurting. This may indicate a ruptured eardrum.  You or your children develop new problems such as severe headaches, stiff neck, difficulty swallowing, or swelling of the face or around the ear. Document Released: 06/27/2004 Document Revised: 02/02/2012  Document Reviewed: 11/01/2008 [facility] Patient Information 2014 [place], [place].  Follow up for any rash, fever, severe headache, facial weakness, or worsening pain.

## 2013-08-31 NOTE — Progress Notes (Signed)
Subjective:    Patient ID: Cynthia Gilmore, female    DOB: November 15, 1934, 79 y.o.   MRN: [id number]  HPI Patient seen for acute visit She is seen with some algia and left facial pain. Onset yesterday around 1 PM and this lasted until she went to sleep. She describes sharp intermittent pain moderate intensity. Relieved with Advil. She denies any TMJ pain. No clear exacerbating factors. No associated rash. No hearing changes. No ear discharge. No associated headaches. No history of similar pain. She does complain of some dry mouth which is been more chronic but his been treated for years with amitriptyline per psychiatry. No history of trigeminal neuralgia. No recent appetite or weight changes.  Past Medical History  Diagnosis Date  . Anxiety and depression     ? bipolar  . GERD (gastroesophageal reflux disease)   . Hypertension   . TIA (transient ischemic attack) 2000    x 3   . Osteoarthritis (arthritis due to wear and tear of joints)   . IBS (irritable bowel syndrome)   . Segmental colitis     iscemic colitis 2009  . Allergy   . Cataract     bil cataracts removed  . Hiatal hernia   . Lymphocytic colitis 2012  . Hemorrhoids 2012   . Anxiety   . Depression   . Heart murmur   . Stroke     states 2 "'mini strokes" 1980   Past Surgical History  Procedure Laterality Date  . Bladder repair  1995  . Cholecystectomy  2004  . Hernia repair  1995  . Partial hysterectomy  1992    partial  . Ankle surgery  1999    right ankle fracture surgery  . Cataract extraction, bilateral    . Colonoscopy w/ biopsies and polypectomy   07/29/2011    internal hemorrhoids, lymphocytic colitis  . Esophagogastroduodenoscopy  07/29/2011    gastritis,hiatal hernia, tortuous esophagus  . Esophageal manometry      Normal  . Eus  06/03/2012    Procedure: UPPER ENDOSCOPIC ULTRASOUND (EUS) LINEAR;  Surgeon: Rachael Fee, MD;  Location: [facility];  Service: Endoscopy;  Laterality: N/A;    reports that  she has quit smoking. She has never used smokeless tobacco. She reports that she does not drink alcohol or use illicit drugs. family history includes Diabetes in her maternal aunt, maternal uncle, and mother; Heart disease in her father and paternal aunt. There is no history of Colon cancer. Allergies  Allergen Reactions  . Alprazolam     REACTION: sleepy  . Amlodipine Besylate     REACTION: swelling  . Aripiprazole   . Azithromycin   . Bupropion Hcl     REACTION: insomnia  . Cefaclor     REACTION: mouth blisters  . Celecoxib     REACTION: "drained"  . Cephalexin   . Ciprofloxacin   . Citalopram Hydrobromide     REACTION: memory loss  . Clonidine Hydrochloride     REACTION: headache, dizziness  . Desvenlafaxine   . Diphenoxylate-Atropine   . Divalproex Sodium     REACTION: nausea,tremors,fatigue  . Doxycycline   . Entex     REACTION: nausea, headache  . Est Estrogens-Methyltest     REACTION: headache  . Fluoxetine Hcl   . Furosemide   . Hydrochlorothiazide   . Hydrocodone-Acetaminophen     REACTION: headache  . Lamotrigine     REACTION: mood swing,chills, nausea  . Lansoprazole   . Lisinopril  REACTION: cough  . Lorazepam   . Meloxicam     REACTION: nausea, diarrhea  . Methylphenidate Hcl   . Metronidazole   . Mirtazapine     REACTION: swelling  . Nabumetone     REACTION: nausea, headache  . Olmesartan Medoxomil   . Other     Alum and Mag Hydroxide Simeth(Solution) GI Cocktail. Diarrhea   . Oxycodone-Acetaminophen   . Pantoprazole Sodium   . Paroxetine     REACTION: tremor, insomnia, nausea  . Quetiapine   . Rabeprazole Sodium     REACTION: diarrhea  . Rofecoxib   . Sertraline Hcl     REACTION: exreme confusion  . Sumatriptan     REACTION: cardiologist says "no"  . Telithromycin     REACTION: "drained"  . Topamax     Confusion and mood swings  . Tramadol-Acetaminophen   . Venlafaxine     REACTION: hypertension, nausea, headache  . Verapamil      Headaches and hot flashes   . Vilazodone Hcl [Vilazodone Hcl]       Review of Systems  Constitutional: Negative for appetite change and unexpected weight change.  HENT: Positive for ear pain. Negative for ear discharge, hearing loss, sore throat and trouble swallowing.   Respiratory: Negative for shortness of breath.   Skin: Negative for rash.  Neurological: Negative for headaches.  Hematological: Negative for adenopathy.       Objective:   Physical Exam  Constitutional: She is oriented to person, place, and time. She appears well-developed and well-nourished.  HENT:  Head: Normocephalic and atraumatic.  Right Ear: External ear normal.  Left Ear: External ear normal.  Mouth/Throat: Oropharynx is clear and moist.  Patient has no TMJ tenderness  Eyes: Pupils are equal, round, and reactive to light.  Neck: Neck supple.  Cardiovascular: Normal rate.   Pulmonary/Chest: Effort normal and breath sounds normal. No respiratory distress. She has no wheezes. She has no rales.  Lymphadenopathy:    She has no cervical adenopathy.  Neurological: She is alert and oriented to person, place, and time. No cranial nerve deficit.  Skin: No rash noted.          Assessment & Plan:  Left facial pain. She complains of left otalgia but also left facial pain. No evidence for ear infection. No evidence for dental etiology. No skin rash. No evidence for TMJ syndrome. Pain somewhat trigeminal distribution but is absent today. We've recommended observation this time. Be in touch if she has recurrent pain or any new symptoms such as rash, facial pain, or headache.

## 2013-09-08 ENCOUNTER — Other Ambulatory Visit: Payer: Self-pay | Admitting: *Deleted

## 2013-09-08 ENCOUNTER — Other Ambulatory Visit: Payer: Self-pay

## 2013-09-08 MED ORDER — METFORMIN HCL ER 500 MG PO TB24: 500.0000 mg | ORAL_TABLET | Freq: Every day | ORAL | Status: DC

## 2013-09-08 MED ORDER — CHOLESTYRAMINE LIGHT 4 G PO PACK: 4.0000 g | PACK | Freq: Every evening | ORAL | Status: DC | PRN

## 2013-10-03 ENCOUNTER — Ambulatory Visit (INDEPENDENT_AMBULATORY_CARE_PROVIDER_SITE_OTHER): Payer: Medicare Other | Admitting: Internal Medicine

## 2013-10-03 ENCOUNTER — Encounter: Payer: Self-pay | Admitting: Internal Medicine

## 2013-10-03 VITALS — BP 120/72 | Temp 97.9°F | Wt 164.0 lb

## 2013-10-03 DIAGNOSIS — R059 Cough, unspecified: Secondary | ICD-10-CM

## 2013-10-03 DIAGNOSIS — R05 Cough: Secondary | ICD-10-CM

## 2013-10-03 DIAGNOSIS — E119 Type 2 diabetes mellitus without complications: Secondary | ICD-10-CM

## 2013-10-03 DIAGNOSIS — I10 Essential (primary) hypertension: Secondary | ICD-10-CM

## 2013-10-03 DIAGNOSIS — R6889 Other general symptoms and signs: Secondary | ICD-10-CM

## 2013-10-03 DIAGNOSIS — F341 Dysthymic disorder: Secondary | ICD-10-CM

## 2013-10-03 NOTE — Progress Notes (Signed)
Subjective:    Patient ID: Cynthia Gilmore, female    DOB: [birth year], 79 y.o.   MRN: [id number]  HPI  79 year old white female with type 2 diabetes, chronic cough and hypertension for routine followup. Patient has been monitoring her blood sugar diligently. Her morning blood sugars are between 110 and 120. Patient has been trying her best to follow carb diet. Has been somewhat difficult due to her fondness of sweets.  Patient complains that since starting amitriptyline she is experience alteration in her sense of taste. She denies any change in her sense of smell.  Patient complains of persistent cough. No improvement with using fexofenadine and intranasal steroids. She is using Robitussin at bedtime. She is former smoker. She quit in 1960. She has a 15-pack-year history.    Review of Systems Negative for postnasal drip, negative for chest pain, negative for numbness or tingling in her feet  Past Medical History  Diagnosis Date  . Anxiety and depression     ? bipolar  . GERD (gastroesophageal reflux disease)   . Hypertension   . TIA (transient ischemic attack) 2000    x 3   . Osteoarthritis (arthritis due to wear and tear of joints)   . IBS (irritable bowel syndrome)   . Segmental colitis     iscemic colitis 2009  . Allergy   . Cataract     bil cataracts removed  . Hiatal hernia   . Lymphocytic colitis 2012  . Hemorrhoids 2012   . Anxiety   . Depression   . Heart murmur   . Stroke     states 2 "'mini strokes" 1980    History   Social History  . Marital Status: Married    Spouse Name: N/A    Number of Children: 2  . Years of Education: N/A   Occupational History  . Retired    Social History Main Topics  . Smoking status: Former Games developer  . Smokeless tobacco: Never Used     Comment: stopped in 1962  . Alcohol Use: No  . Drug Use: No  . Sexual Activity: Not on file   Other Topics Concern  . Not on file   Social History Narrative   Occupation: retired   Patient is a former smoker. -stopped 1962   Alcohol Use - no     Illicit Drug Use - no         Daily Caffeine Use  -1 cup daily               Past Surgical History  Procedure Laterality Date  . Bladder repair  1995  . Cholecystectomy  2004  . Hernia repair  1995  . Partial hysterectomy  1992    partial  . Ankle surgery  1999    right ankle fracture surgery  . Cataract extraction, bilateral    . Colonoscopy w/ biopsies and polypectomy   07/29/2011    internal hemorrhoids, lymphocytic colitis  . Esophagogastroduodenoscopy  07/29/2011    gastritis,hiatal hernia, tortuous esophagus  . Esophageal manometry      Normal  . Eus  06/03/2012    Procedure: UPPER ENDOSCOPIC ULTRASOUND (EUS) LINEAR;  Surgeon: Rachael Fee, MD;  Location: [facility];  Service: Endoscopy;  Laterality: N/A;    Family History  Problem Relation Age of Onset  . Diabetes Mother   . Diabetes Maternal Aunt   . Heart disease Father   . Diabetes Maternal Uncle   . Heart disease Paternal Aunt   .  Colon cancer Neg Hx     Allergies  Allergen Reactions  . Alprazolam     REACTION: sleepy  . Amlodipine Besylate     REACTION: swelling  . Aripiprazole   . Azithromycin   . Bupropion Hcl     REACTION: insomnia  . Cefaclor     REACTION: mouth blisters  . Celecoxib     REACTION: "drained"  . Cephalexin   . Ciprofloxacin   . Citalopram Hydrobromide     REACTION: memory loss  . Clonidine Hydrochloride     REACTION: headache, dizziness  . Desvenlafaxine   . Diphenoxylate-Atropine   . Divalproex Sodium     REACTION: nausea,tremors,fatigue  . Doxycycline   . Entex     REACTION: nausea, headache  . Est Estrogens-Methyltest     REACTION: headache  . Fluoxetine Hcl   . Furosemide   . Hydrochlorothiazide   . Hydrocodone-Acetaminophen     REACTION: headache  . Lamotrigine     REACTION: mood swing,chills, nausea  . Lansoprazole   . Lisinopril     REACTION: cough  . Lorazepam   . Meloxicam      REACTION: nausea, diarrhea  . Methylphenidate Hcl   . Metronidazole   . Mirtazapine     REACTION: swelling  . Nabumetone     REACTION: nausea, headache  . Olmesartan Medoxomil   . Other     Alum and Mag Hydroxide Simeth(Solution) GI Cocktail. Diarrhea   . Oxycodone-Acetaminophen   . Pantoprazole Sodium   . Paroxetine     REACTION: tremor, insomnia, nausea  . Quetiapine   . Rabeprazole Sodium     REACTION: diarrhea  . Rofecoxib   . Sertraline Hcl     REACTION: exreme confusion  . Sumatriptan     REACTION: cardiologist says "no"  . Telithromycin     REACTION: "drained"  . Topamax     Confusion and mood swings  . Tramadol-Acetaminophen   . Venlafaxine     REACTION: hypertension, nausea, headache  . Verapamil     Headaches and hot flashes   . Vilazodone Hcl [Vilazodone Hcl]     Current Outpatient Prescriptions on File Prior to Visit  Medication Sig Dispense Refill  . amitriptyline (ELAVIL) 25 MG tablet Take 75 mg by mouth at bedtime. 25mg  in the a.m. 50mg  p.m.      Marland Kitchen cholestyramine light (PREVALITE) 4 G packet Take 1 packet (4 g total) by mouth at bedtime as needed. As directed and  as needed for diarrhea  60 packet  0  . cyclobenzaprine (FLEXERIL) 5 MG tablet Take 5 mg by mouth at bedtime as needed.        . fexofenadine (ALLEGRA) 180 MG tablet Take 1 tablet (180 mg total) by mouth daily.      . fish oil-omega-3 fatty acids 1000 MG capsule Take 2 g by mouth daily.      . fluticasone (FLONASE) 50 MCG/ACT nasal spray Place 2 sprays into the nose daily.  16 g  3  . glucose blood (ACCU-CHEK SMARTVIEW) test strip 1 each by Other route daily.  100 each  12  . hyoscyamine (ANASPAZ) 0.125 MG TBDP Place 0.125 mg under the tongue as needed.        . Loperamide HCl (IMODIUM PO) Take 2 tablets by mouth daily as needed.       . metFORMIN (GLUCOPHAGE-XR) 500 MG 24 hr tablet Take 1 tablet (500 mg total) by mouth daily with breakfast.  30 tablet  5  . ranitidine (ZANTAC) 150 MG tablet  Take 1 tablet (150 mg total) by mouth 2 (two) times daily.  60 tablet  3  . spironolactone (ALDACTONE) 25 MG tablet Take 1 tablet (25 mg total) by mouth daily.  90 tablet  1  . valsartan (DIOVAN) 320 MG tablet Take 1 tablet (320 mg total) by mouth daily.  90 tablet  1   No current facility-administered medications on file prior to visit.    BP 120/72  Temp(Src) 97.9 F (36.6 C) (Oral)  Wt 164 lb (74.39 kg)       Objective:   Physical Exam  Constitutional: She is oriented to person, place, and time. She appears well-developed and well-nourished. No distress.  HENT:  Head: Normocephalic and atraumatic.  Right Ear: External ear normal.  Left Ear: External ear normal.  Mouth/Throat: Oropharynx is clear and moist.  Neck: Neck supple.  Cardiovascular: Normal rate and regular rhythm.   No murmur heard. Pulmonary/Chest: Effort normal and breath sounds normal. She has no wheezes.  Musculoskeletal: She exhibits no edema.  Lymphadenopathy:    She has no cervical adenopathy.  Neurological: She is alert and oriented to person, place, and time. No cranial nerve deficit.  Skin: Skin is warm and dry.  Psychiatric: She has a normal mood and affect. Her behavior is normal.          Assessment & Plan:

## 2013-10-03 NOTE — Assessment & Plan Note (Addendum)
Stable. No change in medication. BP: 120/72 mmHg  Lab Results  Component Value Date   CREATININE 0.9 07/06/2013

## 2013-10-03 NOTE — Assessment & Plan Note (Signed)
Patient was able to taper off Librium by working with a psychiatrist. She is doing well on amitriptyline but is experiencing dry mouth and altered taste.

## 2013-10-03 NOTE — Patient Instructions (Addendum)
Follow up with Dr. Dione Booze as directed for diabetic eye exam.  Your last A1c in August was 7.4. Please complete the following lab tests before your next follow up appointment: BMET, A1c - 250.02

## 2013-10-03 NOTE — Assessment & Plan Note (Signed)
Patient has persistent chronic cough going on for several months. She has some nasal congestion but she was also a smoker in the past. Obtain CT of sinuses and CT of chest without contrast.

## 2013-10-03 NOTE — Assessment & Plan Note (Signed)
Blood sugars are fairly well controlled.  Continue same dose of metformin and carb modified diet. Monitor A1c.  Patient's foot exam is normal. Patient updated with appropriate vaccines. Patient has appointment with ophthalmologist in December. She understands to notify her ophthalmologist of new onset type 2 diabetes and her previous A1c of 7.4 in August of 2014.

## 2013-10-03 NOTE — Progress Notes (Signed)
Pre visit review using our clinic review tool, if applicable. No additional management support is needed unless otherwise documented below in the visit note. 

## 2013-10-04 LAB — BASIC METABOLIC PANEL
Calcium: 9.7 mg/dL (ref 8.4–10.5)
Chloride: 102 mEq/L (ref 96–112)
GFR: 89 mL/min (ref >60.00)
Potassium: 4.4 mEq/L (ref 3.5–5.1)
Sodium: 136 mEq/L (ref 135–145)

## 2013-10-04 LAB — MICROALBUMIN / CREATININE URINE RATIO
Creatinine,U: 143.6 mg/dL
Microalb, Ur: 1.1 mg/dL (ref 0.0–1.9)

## 2013-10-04 LAB — HEMOGLOBIN A1C: Hgb A1c MFr Bld: 6.7 % — ABNORMAL HIGH (ref 4.6–6.5)

## 2013-10-04 LAB — T4, FREE: Free T4: 0.96 ng/dL (ref 0.60–1.60)

## 2013-10-04 LAB — TSH: TSH: 0.36 u[IU]/mL (ref 0.35–5.50)

## 2013-10-07 ENCOUNTER — Telehealth: Payer: Self-pay | Admitting: Internal Medicine

## 2013-10-07 NOTE — Telephone Encounter (Signed)
Angie from [facility] is calling to obtain additional clinical info for ct of sinuses. Case # [id number]

## 2013-10-11 NOTE — Telephone Encounter (Signed)
Authorization recieved

## 2013-10-14 ENCOUNTER — Ambulatory Visit (INDEPENDENT_AMBULATORY_CARE_PROVIDER_SITE_OTHER)
Admission: RE | Admit: 2013-10-14 | Discharge: 2013-10-14 | Disposition: A | Discharge status: Home / Self Care | Payer: Medicare Other | Source: Ambulatory Visit | Attending: Internal Medicine | Admitting: Internal Medicine

## 2013-10-14 DIAGNOSIS — R05 Cough: Secondary | ICD-10-CM

## 2013-10-17 ENCOUNTER — Ambulatory Visit: Payer: Medicare Other | Admitting: Internal Medicine

## 2013-11-01 ENCOUNTER — Other Ambulatory Visit: Payer: Self-pay | Admitting: Internal Medicine

## 2013-11-03 ENCOUNTER — Encounter (INDEPENDENT_AMBULATORY_CARE_PROVIDER_SITE_OTHER): Payer: Self-pay

## 2013-11-03 ENCOUNTER — Ambulatory Visit (INDEPENDENT_AMBULATORY_CARE_PROVIDER_SITE_OTHER): Payer: Medicare Other | Admitting: Interventional Cardiology

## 2013-11-03 ENCOUNTER — Encounter: Payer: Self-pay | Admitting: Interventional Cardiology

## 2013-11-03 VITALS — BP 126/70 | HR 82 | Ht 65.5 in | Wt 166.0 lb

## 2013-11-03 DIAGNOSIS — I4891 Unspecified atrial fibrillation: Secondary | ICD-10-CM

## 2013-11-03 DIAGNOSIS — I471 Supraventricular tachycardia: Secondary | ICD-10-CM | POA: Insufficient documentation

## 2013-11-03 DIAGNOSIS — E119 Type 2 diabetes mellitus without complications: Secondary | ICD-10-CM

## 2013-11-03 DIAGNOSIS — I350 Nonrheumatic aortic (valve) stenosis: Secondary | ICD-10-CM

## 2013-11-03 DIAGNOSIS — I359 Nonrheumatic aortic valve disorder, unspecified: Secondary | ICD-10-CM

## 2013-11-03 DIAGNOSIS — I10 Essential (primary) hypertension: Secondary | ICD-10-CM

## 2013-11-03 MED ORDER — ASPIRIN EC 81 MG PO TBEC: 81.0000 mg | DELAYED_RELEASE_TABLET | Freq: Every day | ORAL | Status: DC

## 2013-11-03 NOTE — Progress Notes (Signed)
Patient ID: Cynthia Gilmore, female   DOB: 05-Aug-1934, 79 y.o.   MRN: [id number]   Date: 11/03/2013 ID: Cynthia Gilmore, DOB 07-Jun-1934, MRN [id number] PCP: Thomos Lemons, DO  Reason: Palpitations  ASSESSMENT;  1. Prolonged tachycardia, suspicious for atrial fibrillation 2. Prior history of CVA 3. Systolic murmur compatible with aortic stenosis 4. Diabetes mellitus  PLAN:  1. 30 day continuous monitor to rule out atrial fibrillation 2. 2-D Doppler echocardiogram to assess LV size, cardiac structure and function, and valve function 3. Aspirin 81 mg per day 4. If recurrent tachycardia go immediately to the emergency room or come to the office if during office hours and an EKG performed   SUBJECTIVE: Cynthia Gilmore is a 79 y.o. female who is here for evaluation after having a prolonged episode of tachycardia lasting greater than one hour 72 hours ago. It started spontaneously. She did feel heart racing. He cause her to feel jittery and somewhat weak. There was no chest discomfort. She was able to ambulate. She has had prior brief episodes but nothing lasting this long. There is a history of prior neurological evaluation where she was found to have 3 small areas of possible prior stroke. She denies any specific stroke of in-stent require hospitalization. She denies angina. No orthopnea, PND, edema, or claudication.   Allergies  Allergen Reactions  . Alprazolam     REACTION: sleepy  . Amlodipine Besylate     REACTION: swelling  . Aripiprazole   . Azithromycin   . Bupropion Hcl     REACTION: insomnia  . Cefaclor     REACTION: mouth blisters  . Celecoxib     REACTION: "drained"  . Cephalexin   . Ciprofloxacin   . Citalopram Hydrobromide     REACTION: memory loss  . Clonidine Hydrochloride     REACTION: headache, dizziness  . Desvenlafaxine   . Diphenoxylate-Atropine   . Divalproex Sodium     REACTION: nausea,tremors,fatigue  . Doxycycline   . Entex     REACTION: nausea,  headache  . Est Estrogens-Methyltest     REACTION: headache  . Fluoxetine Hcl   . Furosemide   . Hydrochlorothiazide   . Hydrocodone-Acetaminophen     REACTION: headache  . Lamotrigine     REACTION: mood swing,chills, nausea  . Lansoprazole   . Lisinopril     REACTION: cough  . Lorazepam   . Meloxicam     REACTION: nausea, diarrhea  . Methylphenidate Hcl   . Metronidazole   . Mirtazapine     REACTION: swelling  . Nabumetone     REACTION: nausea, headache  . Olmesartan Medoxomil   . Other     Alum and Mag Hydroxide Simeth(Solution) GI Cocktail. Diarrhea   . Oxycodone-Acetaminophen   . Pantoprazole Sodium   . Paroxetine     REACTION: tremor, insomnia, nausea  . Quetiapine   . Rabeprazole Sodium     REACTION: diarrhea  . Rofecoxib   . Sertraline Hcl     REACTION: exreme confusion  . Sumatriptan     REACTION: cardiologist says "no"  . Telithromycin     REACTION: "drained"  . Topamax     Confusion and mood swings  . Tramadol-Acetaminophen   . Venlafaxine     REACTION: hypertension, nausea, headache  . Verapamil     Headaches and hot flashes   . Vilazodone Hcl [Vilazodone Hcl]     Current Outpatient Prescriptions on File Prior to Visit  Medication Sig Dispense Refill  .  amitriptyline (ELAVIL) 25 MG tablet Take 75 mg by mouth 3 (three) times daily. \      . cholestyramine light (PREVALITE) 4 G packet Take 1 packet (4 g total) by mouth at bedtime as needed. As directed and  as needed for diarrhea  60 packet  0  . cyclobenzaprine (FLEXERIL) 5 MG tablet Take 5 mg by mouth at bedtime as needed.        . fexofenadine (ALLEGRA) 180 MG tablet Take 1 tablet (180 mg total) by mouth daily.      . fish oil-omega-3 fatty acids 1000 MG capsule Take 2 g by mouth daily.      . fluticasone (FLONASE) 50 MCG/ACT nasal spray Place 2 sprays into the nose daily.  16 g  3  . glucose blood (ACCU-CHEK SMARTVIEW) test strip 1 each by Other route daily.  100 each  12  . hyoscyamine  (ANASPAZ) 0.125 MG TBDP Place 0.125 mg under the tongue as needed.        . Loperamide HCl (IMODIUM PO) Take 2 tablets by mouth daily as needed.       . metFORMIN (GLUCOPHAGE-XR) 500 MG 24 hr tablet Take 1 tablet (500 mg total) by mouth daily with breakfast.  30 tablet  5  . ranitidine (ZANTAC) 150 MG tablet Take 1 tablet (150 mg total) by mouth 2 (two) times daily.  60 tablet  3  . spironolactone (ALDACTONE) 25 MG tablet TAKE 1 TABLET BY MOUTH DAILY  90 tablet  1  . valsartan (DIOVAN) 320 MG tablet TAKE 1 TABLET BY MOUTH EVERY DAY  90 tablet  1   No current facility-administered medications on file prior to visit.    Past Medical History  Diagnosis Date  . Anxiety and depression     ? bipolar  . GERD (gastroesophageal reflux disease)   . Hypertension   . TIA (transient ischemic attack) 2000    x 3   . Osteoarthritis (arthritis due to wear and tear of joints)   . IBS (irritable bowel syndrome)   . Segmental colitis     iscemic colitis 2009  . Allergy   . Cataract     bil cataracts removed  . Hiatal hernia   . Lymphocytic colitis 2012  . Hemorrhoids 2012   . Anxiety   . Depression   . Heart murmur   . Stroke     states 2 "'mini strokes" 1980    Past Surgical History  Procedure Laterality Date  . Bladder repair  1995  . Cholecystectomy  2004  . Hernia repair  1995  . Partial hysterectomy  1992    partial  . Ankle surgery  1999    right ankle fracture surgery  . Cataract extraction, bilateral    . Colonoscopy w/ biopsies and polypectomy   07/29/2011    internal hemorrhoids, lymphocytic colitis  . Esophagogastroduodenoscopy  07/29/2011    gastritis,hiatal hernia, tortuous esophagus  . Esophageal manometry      Normal  . Eus  06/03/2012    Procedure: UPPER ENDOSCOPIC ULTRASOUND (EUS) LINEAR;  Surgeon: Rachael Fee, MD;  Location: [facility];  Service: Endoscopy;  Laterality: N/A;    History   Social History  . Marital Status: Married    Spouse Name: N/A     Number of Children: 2  . Years of Education: N/A   Occupational History  . Retired    Social History Main Topics  . Smoking status: Former Games developer  . Smokeless  tobacco: Never Used     Comment: stopped in 1962  . Alcohol Use: No  . Drug Use: No  . Sexual Activity: Not on file   Other Topics Concern  . Not on file   Social History Narrative   Occupation: retired   Patient is a former smoker. -stopped 1962   Alcohol Use - no     Illicit Drug Use - no         Daily Caffeine Use  -1 cup daily               Family History  Problem Relation Age of Onset  . Diabetes Mother   . Diabetes Maternal Aunt   . Heart disease Father   . Diabetes Maternal Uncle   . Heart disease Paternal Aunt   . Colon cancer Neg Hx     ROS: Multiple drug intolerances. No history of asthma. Denies stroke. Recent diagnosis of diabetes. Walks up to 3 miles daily with no cardiopulmonary complaints. She denies orthopnea, PND, transient neurological symptoms, and syncope.. Other systems negative for complaints.  OBJECTIVE: BP 126/70  Pulse 82  Ht 5' 5.5" (1.664 m)  Wt 166 lb (75.297 kg)  BMI 27.19 kg/m2,  General: No acute distress, mild obesity HEENT: normal  Neck: JVD flat. Carotids 2+ bilateral Chest: Clear Cardiac: Murmur: 1-2 of 6 systolic murmur left midsternal border radiating to right upper sternal border. Gallop: Absent. Rhythm: Regular. Other: Normal Abdomen: Bruit: Absent. Pulsation: Absent Extremities: Edema: Absent. Pulses: 2+ and symmetric Neuro: Normal Psych: Normal  ECG: Normal sinus rhythm, right bundle branch block, otherwise unremarkable

## 2013-11-03 NOTE — Patient Instructions (Signed)
Start Aspirin 81mg  daily  Your physician has requested that you have an echocardiogram. Echocardiography is a painless test that uses sound waves to create images of your heart. It provides your doctor with information about the size and shape of your heart and how well your heart's chambers and valves are working. This procedure takes approximately one hour. There are no restrictions for this procedure.   Your physician has recommended that you wear an event monitor. Event monitors are medical devices that record the heart's electrical activity. Doctors most often [place] these monitors to diagnose arrhythmias. Arrhythmias are problems with the speed or rhythm of the heartbeat. The monitor is a small, portable device. You can wear one while you do your normal daily activities. This is usually used to diagnose what is causing palpitations/syncope (passing out).  Your physician recommends that you schedule a follow-up appointment in: 6-8 weeks   Call the office if you are experiencing the following symptoms chest pain, shortness of breath, syncope( passing out) [phone]

## 2013-11-07 ENCOUNTER — Telehealth: Payer: Self-pay | Admitting: Internal Medicine

## 2013-11-07 MED ORDER — AMOXICILLIN 875 MG PO TABS: 875.0000 mg | ORAL_TABLET | Freq: Two times a day (BID) | ORAL | Status: DC

## 2013-11-07 NOTE — Telephone Encounter (Signed)
Call in - Amoxicillin 875 mg  One po bid .  # 20.  No RF

## 2013-11-07 NOTE — Telephone Encounter (Signed)
rx sent in electronically, pt aware 

## 2013-11-07 NOTE — Telephone Encounter (Signed)
Pt has xray 11/21. Was told she had sinus inf. Pt would like something for that, she still has the infection, and a productive cough. pls advise. New Pharm: [facility], [place] [address]

## 2013-11-09 ENCOUNTER — Encounter (INDEPENDENT_AMBULATORY_CARE_PROVIDER_SITE_OTHER): Payer: Medicare Other

## 2013-11-09 ENCOUNTER — Encounter: Payer: Self-pay | Admitting: *Deleted

## 2013-11-09 DIAGNOSIS — I4891 Unspecified atrial fibrillation: Secondary | ICD-10-CM

## 2013-11-09 NOTE — Progress Notes (Signed)
Patient ID: Cynthia Gilmore, female   DOB: Jun 01, 1934, 79 y.o.   MRN: [id number] [employer] 30 day cardiac event monitor applied to patient.

## 2013-11-22 ENCOUNTER — Ambulatory Visit (HOSPITAL_COMMUNITY): Payer: Medicare Other | Attending: Cardiovascular Disease | Admitting: Cardiology

## 2013-11-22 ENCOUNTER — Encounter: Payer: Self-pay | Admitting: Cardiovascular Disease

## 2013-11-22 DIAGNOSIS — I10 Essential (primary) hypertension: Secondary | ICD-10-CM | POA: Insufficient documentation

## 2013-11-22 DIAGNOSIS — Z87891 Personal history of nicotine dependence: Secondary | ICD-10-CM | POA: Insufficient documentation

## 2013-11-22 DIAGNOSIS — I350 Nonrheumatic aortic (valve) stenosis: Secondary | ICD-10-CM

## 2013-11-22 DIAGNOSIS — I471 Supraventricular tachycardia: Secondary | ICD-10-CM

## 2013-11-22 DIAGNOSIS — R011 Cardiac murmur, unspecified: Secondary | ICD-10-CM

## 2013-11-22 DIAGNOSIS — I059 Rheumatic mitral valve disease, unspecified: Secondary | ICD-10-CM | POA: Insufficient documentation

## 2013-11-22 DIAGNOSIS — E119 Type 2 diabetes mellitus without complications: Secondary | ICD-10-CM | POA: Insufficient documentation

## 2013-11-22 DIAGNOSIS — I359 Nonrheumatic aortic valve disorder, unspecified: Secondary | ICD-10-CM

## 2013-11-22 DIAGNOSIS — R002 Palpitations: Secondary | ICD-10-CM

## 2013-11-22 DIAGNOSIS — I4891 Unspecified atrial fibrillation: Secondary | ICD-10-CM

## 2013-11-22 NOTE — Progress Notes (Signed)
Echo performed. 

## 2013-12-09 ENCOUNTER — Telehealth: Payer: Self-pay

## 2013-12-09 NOTE — Telephone Encounter (Signed)
Message copied by Lamar Laundry on Fri Dec 09, 2013  8:21 AM ------      Message from: Daneen Schick      Created: Sun Dec 04, 2013  4:13 PM       Mild increased thickness. With LV outflow obstruction.Awaiting monitor result and will need f/u if not already scheduled. ------

## 2013-12-12 NOTE — Telephone Encounter (Signed)
pt given echo results.Mild increased thickness. With LV outflow obstruction.Awaiting monitor result.pt adv to keep appt sch for 12/26/13.pt verbalized understanding.

## 2013-12-12 NOTE — Telephone Encounter (Signed)
Message copied by Lamar Laundry on Mon Dec 12, 2013  8:19 AM ------      Message from: Daneen Schick      Created: Sun Dec 04, 2013  4:13 PM       Mild increased thickness. With LV outflow obstruction.Awaiting monitor result and will need f/u if not already scheduled. ------

## 2013-12-14 ENCOUNTER — Telehealth: Payer: Self-pay

## 2013-12-14 NOTE — Telephone Encounter (Signed)
pt given results of of cardiac monitor. No arrhythmia , no atrial fib identified.pt to f/u in 4-6 mo or sooner if rapid hr reoccurs.pt agreeable with plan and verbalized understanding.

## 2013-12-26 ENCOUNTER — Ambulatory Visit: Payer: Medicare Other | Admitting: Interventional Cardiology

## 2014-01-03 ENCOUNTER — Ambulatory Visit: Payer: Medicare Other | Admitting: Internal Medicine

## 2014-01-04 ENCOUNTER — Encounter: Payer: Self-pay | Admitting: Internal Medicine

## 2014-01-04 ENCOUNTER — Ambulatory Visit (INDEPENDENT_AMBULATORY_CARE_PROVIDER_SITE_OTHER): Payer: Medicare Other | Admitting: Internal Medicine

## 2014-01-04 VITALS — BP 124/70 | HR 76 | Temp 98.5°F | Ht 65.5 in | Wt 166.0 lb

## 2014-01-04 DIAGNOSIS — R05 Cough: Secondary | ICD-10-CM

## 2014-01-04 DIAGNOSIS — R059 Cough, unspecified: Secondary | ICD-10-CM

## 2014-01-04 DIAGNOSIS — I10 Essential (primary) hypertension: Secondary | ICD-10-CM

## 2014-01-04 DIAGNOSIS — I471 Supraventricular tachycardia: Secondary | ICD-10-CM

## 2014-01-04 DIAGNOSIS — R053 Chronic cough: Secondary | ICD-10-CM

## 2014-01-04 DIAGNOSIS — E119 Type 2 diabetes mellitus without complications: Secondary | ICD-10-CM

## 2014-01-04 MED ORDER — VALSARTAN 320 MG PO TABS: 320.0000 mg | ORAL_TABLET | Freq: Every day | ORAL | Status: DC

## 2014-01-04 MED ORDER — SPIRONOLACTONE 25 MG PO TABS: 25.0000 mg | ORAL_TABLET | Freq: Every day | ORAL | Status: DC

## 2014-01-04 MED ORDER — METFORMIN HCL ER 500 MG PO TB24: 500.0000 mg | ORAL_TABLET | Freq: Two times a day (BID) | ORAL | Status: DC

## 2014-01-04 MED ORDER — FEXOFENADINE HCL 180 MG PO TABS: 180.0000 mg | ORAL_TABLET | Freq: Every day | ORAL | Status: DC

## 2014-01-04 NOTE — Assessment & Plan Note (Signed)
CT of sinuses showed mild changes of sinusitis.  CT of chest was unremarkable. She was treated with amoxicillin 875 mg twice daily. However she had persistent symptoms. Flonase ineffective. Continue fexofenadine 180 mg once daily.

## 2014-01-04 NOTE — Progress Notes (Signed)
Subjective:    Patient ID: Cynthia Gilmore, female    DOB: 11-04-1934, 80 y.o.   MRN: [id number]  HPI  80 year old white female with history of depression/anxiety, hypertension and type 2 diabetes for followup. Interval medical history-she was seen by cardiology secondary to persistent complaints of palpitations. Patient known to have history of TIAs. There was concern for possible atrial fibrillation. Patient completed event recorder as well as 2-D echocardiogram. Patient found to have normal systolic function. Ejection fraction was estimated to be 65-70%. There were no regional wall motion abnormalities. Patient found to have mild subaortic valve gradient.  Event recorder was negative for arrhythmia/atrial fibrillation.  Type 2 diabetes-patient taking Glucophage XL are 500 mg only once daily. She has history of IBS. She does not currently complain of diarrhea. She has chronic intermittent nausea.  Hypertension-stable  Allergic rhinitis-patient reports intranasal steroids were ineffective. She discontinued generic fexofenadine for the same reason but reports improvement with Mucinex allergy formula. This is somewhat confounding because active ingredient is fexofenadine 180 mg.   Review of Systems Negative for chest pain, she reports eye exam in Dec, 2014 was negative for diabetic retinopathy    Past Medical History  Diagnosis Date  . Anxiety and depression     ? bipolar  . GERD (gastroesophageal reflux disease)   . Hypertension   . TIA (transient ischemic attack) 2000    x 3   . Osteoarthritis (arthritis due to wear and tear of joints)   . IBS (irritable bowel syndrome)   . Segmental colitis     iscemic colitis 2009  . Allergy   . Cataract     bil cataracts removed  . Hiatal hernia   . Lymphocytic colitis 2012  . Hemorrhoids 2012   . Anxiety   . Depression   . Heart murmur   . Stroke     states 2 "'mini strokes" 1980    History   Social History  . Marital Status:  Married    Spouse Name: N/A    Number of Children: 2  . Years of Education: N/A   Occupational History  . Retired    Social History Main Topics  . Smoking status: Former Research scientist (life sciences)  . Smokeless tobacco: Never Used     Comment: stopped in 1962  . Alcohol Use: No  . Drug Use: No  . Sexual Activity: Not on file   Other Topics Concern  . Not on file   Social History Narrative   Occupation: retired   Patient is a former smoker. -stopped 1962   Alcohol Use - no     Illicit Drug Use - no         Daily Caffeine Use  -1 cup daily               Past Surgical History  Procedure Laterality Date  . Bladder repair  1995  . Cholecystectomy  2004  . Hernia repair  1995  . Partial hysterectomy  1992    partial  . Ankle surgery  1999    right ankle fracture surgery  . Cataract extraction, bilateral    . Colonoscopy w/ biopsies and polypectomy   07/29/2011    internal hemorrhoids, lymphocytic colitis  . Esophagogastroduodenoscopy  07/29/2011    gastritis,hiatal hernia, tortuous esophagus  . Esophageal manometry      Normal  . Eus  06/03/2012    Procedure: UPPER ENDOSCOPIC ULTRASOUND (EUS) LINEAR;  Surgeon: Milus Banister, MD;  Location: [facility];  Service: Endoscopy;  Laterality: N/A;    Family History  Problem Relation Age of Onset  . Diabetes Mother   . Diabetes Maternal Aunt   . Heart disease Father   . Diabetes Maternal Uncle   . Heart disease Paternal Aunt   . Colon cancer Neg Hx     Allergies  Allergen Reactions  . Alprazolam     REACTION: sleepy  . Amlodipine Besylate     REACTION: swelling  . Aripiprazole   . Azithromycin   . Bupropion Hcl     REACTION: insomnia  . Cefaclor     REACTION: mouth blisters  . Celecoxib     REACTION: "drained"  . Cephalexin   . Ciprofloxacin   . Citalopram Hydrobromide     REACTION: memory loss  . Clonidine Hydrochloride     REACTION: headache, dizziness  . Desvenlafaxine   . Diphenoxylate-Atropine   . Divalproex Sodium       REACTION: nausea,tremors,fatigue  . Doxycycline   . Entex     REACTION: nausea, headache  . Est Estrogens-Methyltest     REACTION: headache  . Fluoxetine Hcl   . Furosemide   . Hydrochlorothiazide   . Hydrocodone-Acetaminophen     REACTION: headache  . Lamotrigine     REACTION: mood swing,chills, nausea  . Lansoprazole   . Lisinopril     REACTION: cough  . Lorazepam   . Meloxicam     REACTION: nausea, diarrhea  . Methylphenidate Hcl   . Metronidazole   . Mirtazapine     REACTION: swelling  . Nabumetone     REACTION: nausea, headache  . Olmesartan Medoxomil   . Other     Alum and Mag Hydroxide Simeth(Solution) GI Cocktail. Diarrhea   . Oxycodone-Acetaminophen   . Pantoprazole Sodium   . Paroxetine     REACTION: tremor, insomnia, nausea  . Quetiapine   . Rabeprazole Sodium     REACTION: diarrhea  . Rofecoxib   . Sertraline Hcl     REACTION: exreme confusion  . Sumatriptan     REACTION: cardiologist says "no"  . Telithromycin     REACTION: "drained"  . Topamax     Confusion and mood swings  . Tramadol-Acetaminophen   . Venlafaxine     REACTION: hypertension, nausea, headache  . Verapamil     Headaches and hot flashes   . Vilazodone Hcl [Vilazodone Hcl]     Current Outpatient Prescriptions on File Prior to Visit  Medication Sig Dispense Refill  . amitriptyline (ELAVIL) 25 MG tablet Take 75 mg by mouth 3 (three) times daily. \      . cholestyramine light (PREVALITE) 4 G packet Take 1 packet (4 g total) by mouth at bedtime as needed. As directed and  as needed for diarrhea  60 packet  0  . cyclobenzaprine (FLEXERIL) 5 MG tablet Take 5 mg by mouth at bedtime as needed.        Marland Kitchen glucose blood (ACCU-CHEK SMARTVIEW) test strip 1 each by Other route daily.  100 each  12  . hyoscyamine (ANASPAZ) 0.125 MG TBDP Place 0.125 mg under the tongue as needed.        . Loperamide HCl (IMODIUM PO) Take 2 tablets by mouth daily as needed.       . ranitidine (ZANTAC) 150 MG  tablet Take 1 tablet (150 mg total) by mouth 2 (two) times daily.  60 tablet  3   No current facility-administered medications on file prior to visit.  BP 124/70  Pulse 76  Temp(Src) 98.5 F (36.9 C) (Oral)  Ht 5' 5.5" (1.664 m)  Wt 166 lb (75.297 kg)  BMI 27.19 kg/m2    Objective:   Physical Exam  Constitutional: She is oriented to person, place, and time. She appears well-developed and well-nourished. No distress.  HENT:  Head: Normocephalic and atraumatic.  Cardiovascular: Normal rate, regular rhythm and normal heart sounds.   Pulmonary/Chest: Effort normal and breath sounds normal. She has no wheezes.  Neurological: She is alert and oriented to person, place, and time. No cranial nerve deficit.  Psychiatric: She has a normal mood and affect. Her behavior is normal.          Assessment & Plan:

## 2014-01-04 NOTE — Assessment & Plan Note (Signed)
Event recorder was negative for arrhythmia/atrial fibrillation. 2-D echocardiogram was essentially normal. Followup with cardiology in 4-6 months as planned.

## 2014-01-04 NOTE — Assessment & Plan Note (Signed)
I recommended titrating metformin xr 500 mg twice daily despite her history of IBS. Continue carb modified diet. Monitor A1c. Patient reports her last eye exam was in December of 2014. It was negative for diabetic retinopathy.  Request copy of eye exam from her ophthalmologist. Lab Results  Component Value Date   HGBA1C 6.7* 10/03/2013   Lab Results  Component Value Date   CREATININE 0.7 10/03/2013

## 2014-01-04 NOTE — Patient Instructions (Signed)
Please complete the following lab tests before your next follow up appointment: BMET, A1c - 250.00 

## 2014-01-04 NOTE — Progress Notes (Signed)
Pre visit review using our clinic review tool, if applicable. No additional management support is needed unless otherwise documented below in the visit note. 

## 2014-01-04 NOTE — Assessment & Plan Note (Signed)
Well controlled. Monitor electrolytes and kidney function. No change in antihypertensive regimen. BP: 124/70 mmHg

## 2014-01-05 ENCOUNTER — Telehealth: Payer: Self-pay | Admitting: Internal Medicine

## 2014-01-05 LAB — BASIC METABOLIC PANEL
BUN: 15 mg/dL (ref 6–23)
CHLORIDE: 106 meq/L (ref 96–112)
CO2: 23 mEq/L (ref 19–32)
Calcium: 9.5 mg/dL (ref 8.4–10.5)
Creatinine, Ser: 0.6 mg/dL (ref 0.4–1.2)
GFR: 100.82 mL/min (ref >60.00)
GLUCOSE: 85 mg/dL (ref 70–99)
POTASSIUM: 4.4 meq/L (ref 3.5–5.1)
Sodium: 139 mEq/L (ref 135–145)

## 2014-01-05 LAB — HEMOGLOBIN A1C: HEMOGLOBIN A1C: 6.4 % (ref 4.6–6.5)

## 2014-01-05 NOTE — Telephone Encounter (Signed)
Relevant patient education mailed to patient.  

## 2014-01-17 ENCOUNTER — Telehealth: Payer: Self-pay | Admitting: Internal Medicine

## 2014-01-17 MED ORDER — METFORMIN HCL ER 500 MG PO TB24: 500.0000 mg | ORAL_TABLET | Freq: Two times a day (BID) | ORAL | Status: DC

## 2014-01-17 NOTE — Telephone Encounter (Signed)
Called pt, Dr Shawna Orleans sent this in on 01/04/14 to [facility] and receipt was confirmed by pharmacy.  Pt states that the pharmacy is saying they didn't get it.  Resent rx in electronically

## 2014-01-17 NOTE — Telephone Encounter (Signed)
Pt is needing new rx metformin 500 mg, sent to [facility] [address], pt states dr Shawna Orleans increased to 2 a day and pt states she will run out on 01/23/14.

## 2014-04-05 ENCOUNTER — Ambulatory Visit (INDEPENDENT_AMBULATORY_CARE_PROVIDER_SITE_OTHER): Payer: Medicare Other | Admitting: Internal Medicine

## 2014-04-05 ENCOUNTER — Encounter: Payer: Self-pay | Admitting: Internal Medicine

## 2014-04-05 VITALS — BP 132/64 | HR 88 | Temp 98.1°F | Ht 65.5 in | Wt 162.0 lb

## 2014-04-05 DIAGNOSIS — E119 Type 2 diabetes mellitus without complications: Secondary | ICD-10-CM

## 2014-04-05 DIAGNOSIS — I10 Essential (primary) hypertension: Secondary | ICD-10-CM

## 2014-04-05 LAB — BASIC METABOLIC PANEL
BUN: 18 mg/dL (ref 6–23)
CHLORIDE: 103 meq/L (ref 96–112)
CO2: 24 meq/L (ref 19–32)
Calcium: 9.5 mg/dL (ref 8.4–10.5)
Creatinine, Ser: 0.8 mg/dL (ref 0.4–1.2)
GFR: 77 mL/min (ref >60.00)
Glucose, Bld: 98 mg/dL (ref 70–99)
Potassium: 3.9 mEq/L (ref 3.5–5.1)
Sodium: 136 mEq/L (ref 135–145)

## 2014-04-05 LAB — HEMOGLOBIN A1C: Hgb A1c MFr Bld: 6.3 % (ref 4.6–6.5)

## 2014-04-05 MED ORDER — METFORMIN HCL ER 500 MG PO TB24: 500.0000 mg | ORAL_TABLET | Freq: Two times a day (BID) | ORAL | Status: DC

## 2014-04-05 MED ORDER — SPIRONOLACTONE 25 MG PO TABS: 25.0000 mg | ORAL_TABLET | Freq: Every day | ORAL | Status: DC

## 2014-04-05 MED ORDER — RANITIDINE HCL 150 MG PO TABS: 150.0000 mg | ORAL_TABLET | Freq: Two times a day (BID) | ORAL | Status: DC

## 2014-04-05 MED ORDER — VALSARTAN 320 MG PO TABS: 320.0000 mg | ORAL_TABLET | Freq: Every day | ORAL | Status: DC

## 2014-04-05 NOTE — Assessment & Plan Note (Addendum)
Patient reports intermittent nausea since taking metformin xr 500 mg twice daily. Her nausea is not severe. Patient advised continue take medication with meals. Monitor A1c. Lab Results  Component Value Date   CREATININE 0.8 04/05/2014

## 2014-04-05 NOTE — Progress Notes (Signed)
Pre visit review using our clinic review tool, if applicable. No additional management support is needed unless otherwise documented below in the visit note. 

## 2014-04-05 NOTE — Progress Notes (Signed)
Subjective:    Patient ID: Cynthia Gilmore, female    DOB: 07/22/1934, 80 y.o.   MRN: [id number]  HPI  80 year old white female for followup regarding type 2 diabetes. Patient taking metformin xr 500 mg twice daily. She complains of intermittent nausea.  She has lost 4 lbs since previous visit.  Patient reports dietary compliance is fair. She is trying her best to avoid sweets.   She takes metformin with meals.    Review of Systems Negative for chest pain    Past Medical History  Diagnosis Date  . Anxiety and depression     ? bipolar  . GERD (gastroesophageal reflux disease)   . Hypertension   . TIA (transient ischemic attack) 2000    x 3   . Osteoarthritis (arthritis due to wear and tear of joints)   . IBS (irritable bowel syndrome)   . Segmental colitis     iscemic colitis 2009  . Allergy   . Cataract     bil cataracts removed  . Hiatal hernia   . Lymphocytic colitis 2012  . Hemorrhoids 2012   . Anxiety   . Depression   . Heart murmur   . Stroke     states 2 "'mini strokes" 1980    History   Social History  . Marital Status: Married    Spouse Name: N/A    Number of Children: 2  . Years of Education: N/A   Occupational History  . Retired    Social History Main Topics  . Smoking status: Former Research scientist (life sciences)  . Smokeless tobacco: Never Used     Comment: stopped in 1962  . Alcohol Use: No  . Drug Use: No  . Sexual Activity: Not on file   Other Topics Concern  . Not on file   Social History Narrative   Occupation: retired   Patient is a former smoker. -stopped 1962   Alcohol Use - no     Illicit Drug Use - no         Daily Caffeine Use  -1 cup daily               Past Surgical History  Procedure Laterality Date  . Bladder repair  1995  . Cholecystectomy  2004  . Hernia repair  1995  . Partial hysterectomy  1992    partial  . Ankle surgery  1999    right ankle fracture surgery  . Cataract extraction, bilateral    . Colonoscopy w/ biopsies and  polypectomy   07/29/2011    internal hemorrhoids, lymphocytic colitis  . Esophagogastroduodenoscopy  07/29/2011    gastritis,hiatal hernia, tortuous esophagus  . Esophageal manometry      Normal  . Eus  06/03/2012    Procedure: UPPER ENDOSCOPIC ULTRASOUND (EUS) LINEAR;  Surgeon: Milus Banister, MD;  Location: [facility];  Service: Endoscopy;  Laterality: N/A;    Family History  Problem Relation Age of Onset  . Diabetes Mother   . Diabetes Maternal Aunt   . Heart disease Father   . Diabetes Maternal Uncle   . Heart disease Paternal Aunt   . Colon cancer Neg Hx     Allergies  Allergen Reactions  . Alprazolam     REACTION: sleepy  . Amlodipine Besylate     REACTION: swelling  . Aripiprazole   . Azithromycin   . Bupropion Hcl     REACTION: insomnia  . Cefaclor     REACTION: mouth blisters  . Celecoxib  REACTION: "drained"  . Cephalexin   . Ciprofloxacin   . Citalopram Hydrobromide     REACTION: memory loss  . Clonidine Hydrochloride     REACTION: headache, dizziness  . Desvenlafaxine   . Diphenoxylate-Atropine   . Divalproex Sodium     REACTION: nausea,tremors,fatigue  . Doxycycline   . Entex     REACTION: nausea, headache  . Est Estrogens-Methyltest     REACTION: headache  . Fluoxetine Hcl   . Furosemide   . Hydrochlorothiazide   . Hydrocodone-Acetaminophen     REACTION: headache  . Lamotrigine     REACTION: mood swing,chills, nausea  . Lansoprazole   . Lisinopril     REACTION: cough  . Lorazepam   . Meloxicam     REACTION: nausea, diarrhea  . Methylphenidate Hcl   . Metronidazole   . Mirtazapine     REACTION: swelling  . Nabumetone     REACTION: nausea, headache  . Olmesartan Medoxomil   . Other     Alum and Mag Hydroxide Simeth(Solution) GI Cocktail. Diarrhea   . Oxycodone-Acetaminophen   . Pantoprazole Sodium   . Paroxetine     REACTION: tremor, insomnia, nausea  . Quetiapine   . Rabeprazole Sodium     REACTION: diarrhea  . Rofecoxib     . Sertraline Hcl     REACTION: exreme confusion  . Sumatriptan     REACTION: cardiologist says "no"  . Telithromycin     REACTION: "drained"  . Topamax     Confusion and mood swings  . Tramadol-Acetaminophen   . Venlafaxine     REACTION: hypertension, nausea, headache  . Verapamil     Headaches and hot flashes   . Vilazodone Hcl [Vilazodone Hcl]     Current Outpatient Prescriptions on File Prior to Visit  Medication Sig Dispense Refill  . amitriptyline (ELAVIL) 25 MG tablet Take 75 mg by mouth 3 (three) times daily. \      . cholestyramine light (PREVALITE) 4 G packet Take 1 packet (4 g total) by mouth at bedtime as needed. As directed and  as needed for diarrhea  60 packet  0  . cyclobenzaprine (FLEXERIL) 5 MG tablet Take 5 mg by mouth at bedtime as needed.        . fexofenadine (ALLEGRA) 180 MG tablet Take 1 tablet (180 mg total) by mouth daily.      Marland Kitchen glucose blood (ACCU-CHEK SMARTVIEW) test strip 1 each by Other route daily.  100 each  12  . hyoscyamine (ANASPAZ) 0.125 MG TBDP Place 0.125 mg under the tongue as needed.        . Loperamide HCl (IMODIUM PO) Take 2 tablets by mouth daily as needed.       . metFORMIN (GLUCOPHAGE-XR) 500 MG 24 hr tablet Take 1 tablet (500 mg total) by mouth 2 (two) times daily with a meal.  180 tablet  1  . ranitidine (ZANTAC) 150 MG tablet Take 1 tablet (150 mg total) by mouth 2 (two) times daily.  60 tablet  3  . spironolactone (ALDACTONE) 25 MG tablet Take 1 tablet (25 mg total) by mouth daily.  90 tablet  1  . valsartan (DIOVAN) 320 MG tablet Take 1 tablet (320 mg total) by mouth daily.  90 tablet  1   No current facility-administered medications on file prior to visit.    BP 132/64  Pulse 88  Temp(Src) 98.1 F (36.7 C) (Oral)  Ht 5' 5.5" (1.664 m)  Wt 162  lb (73.483 kg)  BMI 26.54 kg/m2    Objective:   Physical Exam  Constitutional: She is oriented to person, place, and time. She appears well-developed and well-nourished. No  distress.  Cardiovascular: Normal rate, regular rhythm and normal heart sounds.   Pulmonary/Chest: Effort normal and breath sounds normal.  Neurological: She is alert and oriented to person, place, and time. No cranial nerve deficit.          Assessment & Plan:

## 2014-04-05 NOTE — Patient Instructions (Signed)
Please complete the following lab tests before your next follow up appointment: BMET, A1c, FLP, microalb/cr ratio - 250.00

## 2014-04-20 ENCOUNTER — Ambulatory Visit (INDEPENDENT_AMBULATORY_CARE_PROVIDER_SITE_OTHER): Payer: Medicare Other | Admitting: Interventional Cardiology

## 2014-04-20 ENCOUNTER — Encounter: Payer: Self-pay | Admitting: Interventional Cardiology

## 2014-04-20 VITALS — BP 124/68 | HR 64 | Ht 65.5 in | Wt 161.0 lb

## 2014-04-20 DIAGNOSIS — Q244 Congenital subaortic stenosis: Secondary | ICD-10-CM

## 2014-04-20 DIAGNOSIS — R002 Palpitations: Secondary | ICD-10-CM

## 2014-04-20 DIAGNOSIS — I10 Essential (primary) hypertension: Secondary | ICD-10-CM

## 2014-04-20 NOTE — Patient Instructions (Signed)
Your physician recommends that you continue on your current medications as directed. Please refer to the Current Medication list given to you today.   Your physician recommends that you schedule a follow-up appointment as needed  

## 2014-04-20 NOTE — Progress Notes (Signed)
Patient ID: Cynthia Gilmore, female   DOB: 01-15-1934, 80 y.o.   MRN: [id number]    [address] [place], [place]  [address] Phone: [phone] Fax:  [phone]  Date:  04/20/2014   ID:  Cynthia Gilmore, DOB [birth year], MRN [id number]  PCP:  Drema Pry, DO   ASSESSMENT:  1. Palpitations, etiology uncertain. No evidence of atrial fibrillation no prolonged monitoring 2. Basal septal hypertrophy with some valvular aortic stenosis on 2-D Doppler echocardiogram, otherwise unremarkable 3. Hypertension, controlled  PLAN:  1. Clinical observation. No further evaluation necessary. I still have suspicion of possible atrial fibrillation and have told her that if she has a prolonged episodes of tachycardia she should come to the office for EKG go to the nearest [facility].    SUBJECTIVE: Cynthia Gilmore is a 80 y.o. female who is asymptomatic. She underwent thirty-day continuous monitor and an echocardiogram. Needed reveal any significant abnormality. She has focal basal septal hypertrophy with LV outflow turbulence. No gradient was calculated.   Wt Readings from Last 3 Encounters:  04/20/14 161 lb (73.029 kg)  04/05/14 162 lb (73.483 kg)  01/04/14 166 lb (75.297 kg)     Past Medical History  Diagnosis Date  . Anxiety and depression     ? bipolar  . GERD (gastroesophageal reflux disease)   . Hypertension   . TIA (transient ischemic attack) 2000    x 3   . Osteoarthritis (arthritis due to wear and tear of joints)   . IBS (irritable bowel syndrome)   . Segmental colitis     iscemic colitis 2009  . Allergy   . Cataract     bil cataracts removed  . Hiatal hernia   . Lymphocytic colitis 2012  . Hemorrhoids 2012   . Anxiety   . Depression   . Heart murmur   . Stroke     states 2 "'mini strokes" 1980    Current Outpatient Prescriptions  Medication Sig Dispense Refill  . amitriptyline (ELAVIL) 25 MG tablet Take 75 mg by mouth 3 (three) times daily. \        . cholestyramine light (PREVALITE) 4 G packet Take 1 packet (4 g total) by mouth at bedtime as needed. As directed and  as needed for diarrhea  60 packet  0  . cyclobenzaprine (FLEXERIL) 5 MG tablet Take 5 mg by mouth at bedtime as needed.        . fexofenadine (ALLEGRA) 180 MG tablet Take 1 tablet (180 mg total) by mouth daily.      Marland Kitchen glucose blood (ACCU-CHEK SMARTVIEW) test strip 1 each by Other route daily.  100 each  12  . hyoscyamine (ANASPAZ) 0.125 MG TBDP Place 0.125 mg under the tongue as needed.        . Loperamide HCl (IMODIUM PO) Take 2 tablets by mouth daily as needed.       . metFORMIN (GLUCOPHAGE-XR) 500 MG 24 hr tablet Take 1 tablet (500 mg total) by mouth 2 (two) times daily with a meal.  180 tablet  1  . Omega-3 Fatty Acids (FISH OIL) 1200 MG CPDR Take 2 capsules by mouth daily.      . ranitidine (ZANTAC) 150 MG tablet Take 1 tablet (150 mg total) by mouth 2 (two) times daily.  180 tablet  1  . spironolactone (ALDACTONE) 25 MG tablet Take 1 tablet (25 mg total) by mouth daily.  90 tablet  1  . valsartan (DIOVAN) 320  MG tablet Take 1 tablet (320 mg total) by mouth daily.  90 tablet  1   No current facility-administered medications for this visit.    Allergies:    Allergies  Allergen Reactions  . Alprazolam     REACTION: sleepy  . Amlodipine Besylate     REACTION: swelling  . Aripiprazole   . Azithromycin   . Bupropion Hcl     REACTION: insomnia  . Cefaclor     REACTION: mouth blisters  . Celecoxib     REACTION: "drained"  . Cephalexin   . Ciprofloxacin   . Citalopram Hydrobromide     REACTION: memory loss  . Clonidine Hydrochloride     REACTION: headache, dizziness  . Desvenlafaxine   . Diphenoxylate-Atropine   . Divalproex Sodium     REACTION: nausea,tremors,fatigue  . Doxycycline   . Entex     REACTION: nausea, headache  . Est Estrogens-Methyltest     REACTION: headache  . Fluoxetine Hcl   . Furosemide   . Hydrochlorothiazide   .  Hydrocodone-Acetaminophen     REACTION: headache  . Lamotrigine     REACTION: mood swing,chills, nausea  . Lansoprazole   . Lisinopril     REACTION: cough  . Lorazepam   . Meloxicam     REACTION: nausea, diarrhea  . Methylphenidate Hcl   . Metronidazole   . Mirtazapine     REACTION: swelling  . Nabumetone     REACTION: nausea, headache  . Olmesartan Medoxomil   . Other     Alum and Mag Hydroxide Simeth(Solution) GI Cocktail. Diarrhea   . Oxycodone-Acetaminophen   . Pantoprazole Sodium   . Paroxetine     REACTION: tremor, insomnia, nausea  . Quetiapine   . Rabeprazole Sodium     REACTION: diarrhea  . Rofecoxib   . Sertraline Hcl     REACTION: exreme confusion  . Sumatriptan     REACTION: cardiologist says "no"  . Telithromycin     REACTION: "drained"  . Topamax     Confusion and mood swings  . Tramadol-Acetaminophen   . Venlafaxine     REACTION: hypertension, nausea, headache  . Verapamil     Headaches and hot flashes   . Vilazodone Hcl [Vilazodone Hcl]     Social History:  The patient  reports that she has quit smoking. She has never used smokeless tobacco. She reports that she does not drink alcohol or use illicit drugs.   ROS:  Please see the history of present illness.   Appetite is good. No limitations.   All other systems reviewed and negative.   OBJECTIVE: VS:  BP 124/68  Pulse 64  Ht 5' 5.5" (1.664 m)  Wt 161 lb (73.029 kg)  BMI 26.37 kg/m2 Well nourished, well developed, in no acute distress, elderly HEENT: normal Neck: JVD flat. Carotid bruit absent  Cardiac:  normal S1, S2; RRR; no murmur Lungs:  clear to auscultation bilaterally, no wheezing, rhonchi or rales Abd: soft, nontender, no hepatomegaly Ext: Edema absent. Pulses 2+ Skin: warm and dry Neuro:  CNs 2-12 intact, no focal abnormalities noted  EKG:  Not repeated       Signed, Illene Labrador III, MD 04/20/2014 1:56 PM

## 2014-08-01 ENCOUNTER — Other Ambulatory Visit: Payer: Medicare Other

## 2014-08-01 ENCOUNTER — Other Ambulatory Visit (INDEPENDENT_AMBULATORY_CARE_PROVIDER_SITE_OTHER): Payer: Medicare Other

## 2014-08-01 DIAGNOSIS — E1165 Type 2 diabetes mellitus with hyperglycemia: Principal | ICD-10-CM

## 2014-08-01 DIAGNOSIS — I10 Essential (primary) hypertension: Secondary | ICD-10-CM

## 2014-08-01 DIAGNOSIS — IMO0001 Reserved for inherently not codable concepts without codable children: Secondary | ICD-10-CM

## 2014-08-01 LAB — HEPATIC FUNCTION PANEL
ALT: 20 U/L (ref 0–35)
AST: 29 U/L (ref 0–37)
Albumin: 4.5 g/dL (ref 3.5–5.2)
Alkaline Phosphatase: 46 U/L (ref 39–117)
Bilirubin, Direct: 0 mg/dL (ref 0.0–0.3)
TOTAL PROTEIN: 7.6 g/dL (ref 6.0–8.3)
Total Bilirubin: 0.5 mg/dL (ref 0.2–1.2)

## 2014-08-01 LAB — BASIC METABOLIC PANEL
BUN: 15 mg/dL (ref 6–23)
CO2: 23 meq/L (ref 19–32)
CREATININE: 0.6 mg/dL (ref 0.4–1.2)
Calcium: 9.5 mg/dL (ref 8.4–10.5)
Chloride: 104 mEq/L (ref 96–112)
GFR: 100.67 mL/min (ref >60.00)
GLUCOSE: 92 mg/dL (ref 70–99)
Potassium: 3.9 mEq/L (ref 3.5–5.1)
Sodium: 136 mEq/L (ref 135–145)

## 2014-08-01 LAB — HEMOGLOBIN A1C: HEMOGLOBIN A1C: 6.1 % (ref 4.6–6.5)

## 2014-08-01 LAB — MICROALBUMIN / CREATININE URINE RATIO
CREATININE, U: 108.3 mg/dL
MICROALB UR: 0.8 mg/dL (ref 0.0–1.9)
MICROALB/CREAT RATIO: 0.7 mg/g (ref 0.0–30.0)

## 2014-08-07 ENCOUNTER — Ambulatory Visit: Payer: Medicare Other | Admitting: Internal Medicine

## 2014-08-22 ENCOUNTER — Ambulatory Visit: Payer: Medicare Other | Admitting: Internal Medicine

## 2014-09-05 ENCOUNTER — Encounter: Payer: Self-pay | Admitting: Internal Medicine

## 2014-09-28 ENCOUNTER — Ambulatory Visit: Payer: Medicare Other | Admitting: Internal Medicine

## 2014-10-01 ENCOUNTER — Other Ambulatory Visit: Payer: Self-pay | Admitting: Internal Medicine

## 2014-10-13 ENCOUNTER — Ambulatory Visit (INDEPENDENT_AMBULATORY_CARE_PROVIDER_SITE_OTHER): Payer: Medicare Other | Admitting: Internal Medicine

## 2014-10-13 ENCOUNTER — Ambulatory Visit: Payer: Medicare Other | Admitting: Internal Medicine

## 2014-10-13 ENCOUNTER — Encounter: Payer: Self-pay | Admitting: Internal Medicine

## 2014-10-13 VITALS — BP 114/66 | HR 76 | Temp 98.7°F | Ht 65.5 in | Wt 153.0 lb

## 2014-10-13 DIAGNOSIS — Z0000 Encounter for general adult medical examination without abnormal findings: Secondary | ICD-10-CM | POA: Insufficient documentation

## 2014-10-13 DIAGNOSIS — K219 Gastro-esophageal reflux disease without esophagitis: Secondary | ICD-10-CM | POA: Diagnosis not present

## 2014-10-13 DIAGNOSIS — E119 Type 2 diabetes mellitus without complications: Secondary | ICD-10-CM

## 2014-10-13 MED ORDER — OMEPRAZOLE 40 MG PO CPDR: 40.0000 mg | DELAYED_RELEASE_CAPSULE | Freq: Every day | ORAL | Status: DC

## 2014-10-13 NOTE — Progress Notes (Signed)
Subjective:    Patient ID: Cynthia Gilmore, female    DOB: 1934/07/24, 80 y.o.   MRN: [id number]  HPI  80 year old white female with history of hypertension, hyperlipidemia and chronic exercise reflux disease presents for Medicare wellness exam.  Medicare wellness questionnaire reviewed in detail. She has not had any hospitalizations within the last year. She is followed by her gynecologist for breast exams, bone density and pelvic exams. (Dr. Corinna Capra)  Her last ophthalmologist visit was in November 2014. Her ophthalmologist-Dr. Katy Fitch.  She does not have glaucoma but wears corrective lenses.  She has not had any surgeries within the last year.  See attached medical wellness questionnaire for additional details/questions.  Type 2 diabetes-patient reports she is tolerating metformin without difficulty. She monitors her blood sugar and regular basis. They range from 90-110. She is compliant with diabetic diet.  Her husband recently underwent open heart surgery.  GERD-she is followed by Dr. Carlean Purl. Last EGD performed 3 years ago. Patient reports persistent reflux symptoms despite using over-the-counter ranitidine. She has been intolerant to several proton pump inhibitors.  No allergic reaction to PPIs.   Review of Systems   Constitutional: Negative for activity change, appetite change and unexpected weight change.  Eyes: Negative for visual disturbance.  Ears: mild hearing loss Respiratory: Negative for cough, chest tightness and shortness of breath.   Cardiovascular: Negative for chest pain.  Genitourinary: Negative for difficulty urinating.  Neurological: Negative for headaches.  Gastrointestinal: Negative for abdominal pain,  melena or hematochezia.  Chronic heartburn-patient previously seen by Dr. Carlean Purl.  Status post esophageal dilation Psych: Negative for depression or anxiety Endo:  No polyuria or polydypsia    Past Medical History  Diagnosis Date  . Anxiety and depression      ? bipolar  . GERD (gastroesophageal reflux disease)   . Hypertension   . TIA (transient ischemic attack) 2000    x 3   . Osteoarthritis (arthritis due to wear and tear of joints)   . IBS (irritable bowel syndrome)   . Segmental colitis     iscemic colitis 2009  . Allergy   . Cataract     bil cataracts removed  . Hiatal hernia   . Lymphocytic colitis 2012  . Hemorrhoids 2012   . Anxiety   . Depression   . Heart murmur   . Stroke     states 2 "'mini strokes" 1980    History   Social History  . Marital Status: Married    Spouse Name: N/A    Number of Children: 2  . Years of Education: N/A   Occupational History  . Retired    Social History Main Topics  . Smoking status: Former Research scientist (life sciences)  . Smokeless tobacco: Never Used     Comment: stopped in 1962  . Alcohol Use: No  . Drug Use: No  . Sexual Activity: Not on file   Other Topics Concern  . Not on file   Social History Narrative   Occupation: retired   Patient is a former smoker. -stopped 1962   Alcohol Use - no     Illicit Drug Use - no         Daily Caffeine Use  -1 cup daily           Position roster   Cardiologist-Dr. Tamala Julian   GYN-Dr. Corinna Capra   Ophthalmologist-Dr. Katy Fitch   Gastroenterologist-Dr. Carlean Purl    Past Surgical History  Procedure Laterality Date  . Bladder repair  1995  .  Cholecystectomy  2004  . Hernia repair  1995  . Partial hysterectomy  1992    partial  . Ankle surgery  1999    right ankle fracture surgery  . Cataract extraction, bilateral    . Colonoscopy w/ biopsies and polypectomy   07/29/2011    internal hemorrhoids, lymphocytic colitis  . Esophagogastroduodenoscopy  07/29/2011    gastritis,hiatal hernia, tortuous esophagus  . Esophageal manometry      Normal  . Eus  06/03/2012    Procedure: UPPER ENDOSCOPIC ULTRASOUND (EUS) LINEAR;  Surgeon: Milus Banister, MD;  Location: [facility];  Service: Endoscopy;  Laterality: N/A;    Family History  Problem Relation Age of Onset  .  Diabetes Mother   . Diabetes Maternal Aunt   . Heart disease Father   . Diabetes Maternal Uncle   . Heart disease Paternal Aunt   . Colon cancer Neg Hx     Allergies  Allergen Reactions  . Alprazolam     REACTION: sleepy  . Amlodipine Besylate     REACTION: swelling  . Aripiprazole   . Azithromycin   . Bupropion Hcl     REACTION: insomnia  . Cefaclor     REACTION: mouth blisters  . Celecoxib     REACTION: "drained"  . Cephalexin   . Ciprofloxacin   . Citalopram Hydrobromide     REACTION: memory loss  . Clonidine Hydrochloride     REACTION: headache, dizziness  . Desvenlafaxine   . Diphenoxylate-Atropine   . Divalproex Sodium     REACTION: nausea,tremors,fatigue  . Doxycycline   . Entex     REACTION: nausea, headache  . Est Estrogens-Methyltest     REACTION: headache  . Fluoxetine Hcl   . Furosemide   . Hydrochlorothiazide   . Hydrocodone-Acetaminophen     REACTION: headache  . Lamotrigine     REACTION: mood swing,chills, nausea  . Lansoprazole   . Lisinopril     REACTION: cough  . Lorazepam   . Meloxicam     REACTION: nausea, diarrhea  . Methylphenidate Hcl   . Metronidazole   . Mirtazapine     REACTION: swelling  . Nabumetone     REACTION: nausea, headache  . Olmesartan Medoxomil   . Other     Alum and Mag Hydroxide Simeth(Solution) GI Cocktail. Diarrhea   . Oxycodone-Acetaminophen   . Pantoprazole Sodium   . Paroxetine     REACTION: tremor, insomnia, nausea  . Quetiapine   . Rabeprazole Sodium     REACTION: diarrhea  . Rofecoxib   . Sertraline Hcl     REACTION: exreme confusion  . Sumatriptan     REACTION: cardiologist says "no"  . Telithromycin     REACTION: "drained"  . Topamax     Confusion and mood swings  . Tramadol-Acetaminophen   . Venlafaxine     REACTION: hypertension, nausea, headache  . Verapamil     Headaches and hot flashes   . Vilazodone Hcl [Vilazodone Hcl]     Current Outpatient Prescriptions on File Prior to Visit    Medication Sig Dispense Refill  . amitriptyline (ELAVIL) 25 MG tablet Take 75 mg by mouth 3 (three) times daily. \    . cholestyramine light (PREVALITE) 4 G packet Take 1 packet (4 g total) by mouth at bedtime as needed. As directed and  as needed for diarrhea 60 packet 0  . cyclobenzaprine (FLEXERIL) 5 MG tablet Take 5 mg by mouth at bedtime as needed.      Marland Kitchen  fexofenadine (ALLEGRA) 180 MG tablet Take 1 tablet (180 mg total) by mouth daily.    Marland Kitchen glucose blood (ACCU-CHEK SMARTVIEW) test strip 1 each by Other route daily. 100 each 12  . hyoscyamine (ANASPAZ) 0.125 MG TBDP Place 0.125 mg under the tongue as needed.      . Loperamide HCl (IMODIUM PO) Take 2 tablets by mouth daily as needed.     . metFORMIN (GLUCOPHAGE-XR) 500 MG 24 hr tablet Take 1 tablet (500 mg total) by mouth 2 (two) times daily with a meal. 180 tablet 1  . Omega-3 Fatty Acids (FISH OIL) 1200 MG CPDR Take 2 capsules by mouth daily.    Marland Kitchen spironolactone (ALDACTONE) 25 MG tablet TAKE 1 TABLET BY MOUTH DAILY 90 tablet 0  . valsartan (DIOVAN) 320 MG tablet TAKE 1 TABLET BY MOUTH DAILY 90 tablet 0   No current facility-administered medications on file prior to visit.    BP 114/66 mmHg  Pulse 76  Temp(Src) 98.7 F (37.1 C) (Oral)  Ht 5' 5.5" (1.664 m)  Wt 153 lb (69.4 kg)  BMI 25.06 kg/m2     Objective:   Physical Exam  Constitutional: She is oriented to person, place, and time. She appears well-developed and well-nourished. No distress.  HENT:  Head: Normocephalic and atraumatic.  Right Ear: External ear normal.  Left Ear: External ear normal.  Cardiovascular: Normal rate, regular rhythm and normal heart sounds.   No murmur heard. Pulmonary/Chest: Effort normal and breath sounds normal.  Abdominal: Soft. Bowel sounds are normal. There is no tenderness.  Neurological: She is alert and oriented to person, place, and time. No cranial nerve deficit.  Skin: Skin is dry.  Psychiatric: She has a normal mood and affect.  Her behavior is normal.          Assessment & Plan:

## 2014-10-13 NOTE — Assessment & Plan Note (Addendum)
Patient experiencing refractory reflux symptoms despite using over-the-counter ranitidine. She has intolerance to several PPIs. Trial of omeprazole 40 mg once daily.  Her last EGD was performed by Dr. Carlean Purl 2013.  She has hx of gastritis.

## 2014-10-13 NOTE — Assessment & Plan Note (Signed)
Blood sugar control improving. Continue same dose of metformin and carbon modified diet.  Lab Results  Component Value Date   HGBA1C 6.1 08/01/2014   HGBA1C 6.3 04/05/2014   HGBA1C 6.4 01/04/2014   Lab Results  Component Value Date   MICROALBUR 0.8 08/01/2014   LDLCALC 91 07/01/2012   CREATININE 0.6 08/01/2014

## 2014-10-13 NOTE — Progress Notes (Signed)
Pre visit review using our clinic review tool, if applicable. No additional management support is needed unless otherwise documented below in the visit note. 

## 2014-10-13 NOTE — Patient Instructions (Signed)
Please arrange hearing test as directed Please complete the following lab tests before your next follow up appointment: BMET, A1c - 250.00

## 2014-10-13 NOTE — Assessment & Plan Note (Signed)
80 year old white female presents for Medicare wellness visit. See attached form for details. Screening for substance abuse negative. She exercises on a regular basis. She is not sexually active. She does not have any difficulty performing activities of daily living. She denies history of falls or gait instability. She has mild hearing loss and patient advised to follow-up with cardiologist for hearing test.  Patient denies any depressive symptoms. Patient able to balance her own checkbook and denies any significant memory issues.  Patient up-to-date with adult vaccines.

## 2014-10-16 ENCOUNTER — Telehealth: Payer: Self-pay | Admitting: Internal Medicine

## 2014-10-16 MED ORDER — GLUCOSE BLOOD VI STRP: 1.0000 | ORAL_STRIP | Freq: Every day | Status: DC

## 2014-10-16 NOTE — Telephone Encounter (Signed)
Pt needs refill on test strips accu chek  nano send to [address]. Pt was last seen on 10-13-14

## 2014-10-16 NOTE — Telephone Encounter (Signed)
rx sent in electronically 

## 2014-12-07 ENCOUNTER — Encounter: Payer: Self-pay | Admitting: Family Medicine

## 2014-12-07 ENCOUNTER — Ambulatory Visit (INDEPENDENT_AMBULATORY_CARE_PROVIDER_SITE_OTHER): Payer: Medicare Other | Admitting: Family Medicine

## 2014-12-07 VITALS — BP 118/64 | HR 65 | Temp 97.4°F | Wt 153.0 lb

## 2014-12-07 DIAGNOSIS — R110 Nausea: Secondary | ICD-10-CM

## 2014-12-07 DIAGNOSIS — R1013 Epigastric pain: Secondary | ICD-10-CM | POA: Diagnosis not present

## 2014-12-07 DIAGNOSIS — R5383 Other fatigue: Secondary | ICD-10-CM | POA: Diagnosis not present

## 2014-12-07 LAB — BASIC METABOLIC PANEL
BUN: 15 mg/dL (ref 6–23)
CO2: 23 mEq/L (ref 19–32)
Calcium: 10 mg/dL (ref 8.4–10.5)
Chloride: 102 mEq/L (ref 96–112)
Creatinine, Ser: 0.56 mg/dL (ref 0.40–1.20)
GFR: 111.01 mL/min (ref >60.00)
GLUCOSE: 90 mg/dL (ref 70–99)
Potassium: 4.1 mEq/L (ref 3.5–5.1)
Sodium: 134 mEq/L — ABNORMAL LOW (ref 135–145)

## 2014-12-07 LAB — HEPATIC FUNCTION PANEL
ALBUMIN: 4.8 g/dL (ref 3.5–5.2)
ALK PHOS: 61 U/L (ref 39–117)
ALT: 17 U/L (ref 0–35)
AST: 22 U/L (ref 0–37)
BILIRUBIN DIRECT: 0.1 mg/dL (ref 0.0–0.3)
BILIRUBIN TOTAL: 0.5 mg/dL (ref 0.2–1.2)
TOTAL PROTEIN: 7.7 g/dL (ref 6.0–8.3)

## 2014-12-07 LAB — TSH: TSH: 1.59 u[IU]/mL (ref 0.35–4.50)

## 2014-12-07 LAB — LIPASE: LIPASE: 29 U/L (ref 11.0–59.0)

## 2014-12-07 NOTE — Progress Notes (Signed)
Subjective:    Patient ID: Cynthia Gilmore, female    DOB: 1934/08/30, 81 y.o.   MRN: [id number]  HPI Patient has multiple chronic problems-type 2 diabetes, history of IBS, chronic insomnia, hypertension, GERD. She is seen today as a work in an absence of her primary provider with multiple nonspecific symptoms  Somewhat of a poor historian but she relates several months of some nausea without vomiting. She has frequent heartburn symptoms. She takes Zantac which she states causes abdominal cramps. She's tried multiple PPIs in the past but states they caused diarrhea or abdominal cramping. She drinks substantial caffeine- tea and coffee. Complains of nonspecific fatigue. Frequently alternates between diarrhea and going several days without bowel movement. She's had some occasional epigastric abdominal pains apparently had these off and on for years. No recent melena.   Last EGD June 2013. She has follow-up scheduled with GI in February. Previous cholecystectomy.  Patient is concerned because apparently her husband had frequent dyspepsia symptoms and ended up with coronary artery disease. She is requesting further testing. Reportedly had nuclear stress test several years ago unremarkable. She's not had any chest pain or exertional type symptoms.  Type 2 diabetes which has been well controlled. Last A1c 6.1% September 2015. She takes extended release metformin and blood sugar has been well controlled.  Past Medical History  Diagnosis Date  . Anxiety and depression     ? bipolar  . GERD (gastroesophageal reflux disease)   . Hypertension   . TIA (transient ischemic attack) 2000    x 3   . Osteoarthritis (arthritis due to wear and tear of joints)   . IBS (irritable bowel syndrome)   . Segmental colitis     iscemic colitis 2009  . Allergy   . Cataract     bil cataracts removed  . Hiatal hernia   . Lymphocytic colitis 2012  . Hemorrhoids 2012   . Anxiety   . Depression   . Heart murmur     . Stroke     states 2 "'mini strokes" 1980   Past Surgical History  Procedure Laterality Date  . Bladder repair  1995  . Cholecystectomy  2004  . Hernia repair  1995  . Partial hysterectomy  1992    partial  . Ankle surgery  1999    right ankle fracture surgery  . Cataract extraction, bilateral    . Colonoscopy w/ biopsies and polypectomy   07/29/2011    internal hemorrhoids, lymphocytic colitis  . Esophagogastroduodenoscopy  07/29/2011    gastritis,hiatal hernia, tortuous esophagus  . Esophageal manometry      Normal  . Eus  06/03/2012    Procedure: UPPER ENDOSCOPIC ULTRASOUND (EUS) LINEAR;  Surgeon: Milus Banister, MD;  Location: [facility];  Service: Endoscopy;  Laterality: N/A;    reports that she has quit smoking. She has never used smokeless tobacco. She reports that she does not drink alcohol or use illicit drugs. family history includes Diabetes in her maternal aunt, maternal uncle, and mother; Heart disease in her father and paternal aunt. There is no history of Colon cancer. Allergies  Allergen Reactions  . Alprazolam     REACTION: sleepy  . Amlodipine Besylate     REACTION: swelling  . Aripiprazole   . Azithromycin   . Bupropion Hcl     REACTION: insomnia  . Cefaclor     REACTION: mouth blisters  . Celecoxib     REACTION: "drained"  . Cephalexin   . Ciprofloxacin   .  Citalopram Hydrobromide     REACTION: memory loss  . Clonidine Hydrochloride     REACTION: headache, dizziness  . Desvenlafaxine   . Diphenoxylate-Atropine   . Divalproex Sodium     REACTION: nausea,tremors,fatigue  . Doxycycline   . Entex     REACTION: nausea, headache  . Est Estrogens-Methyltest     REACTION: headache  . Fluoxetine Hcl   . Furosemide   . Hydrochlorothiazide   . Hydrocodone-Acetaminophen     REACTION: headache  . Lamotrigine     REACTION: mood swing,chills, nausea  . Lansoprazole   . Lisinopril     REACTION: cough  . Lorazepam   . Meloxicam     REACTION:  nausea, diarrhea  . Methylphenidate Hcl   . Metronidazole   . Mirtazapine     REACTION: swelling  . Nabumetone     REACTION: nausea, headache  . Olmesartan Medoxomil   . Other     Alum and Mag Hydroxide Simeth(Solution) GI Cocktail. Diarrhea   . Oxycodone-Acetaminophen   . Pantoprazole Sodium   . Paroxetine     REACTION: tremor, insomnia, nausea  . Quetiapine   . Rabeprazole Sodium     REACTION: diarrhea  . Rofecoxib   . Sertraline Hcl     REACTION: exreme confusion  . Sumatriptan     REACTION: cardiologist says "no"  . Telithromycin     REACTION: "drained"  . Topamax     Confusion and mood swings  . Tramadol-Acetaminophen   . Venlafaxine     REACTION: hypertension, nausea, headache  . Verapamil     Headaches and hot flashes   . Vilazodone Hcl [Vilazodone Hcl]       Review of Systems  Constitutional: Positive for fatigue. Negative for fever, chills and appetite change.  HENT: Positive for trouble swallowing.   Cardiovascular: Negative for chest pain, palpitations and leg swelling.  Gastrointestinal: Positive for nausea, abdominal pain, diarrhea and constipation. Negative for vomiting.  Genitourinary: Negative for dysuria.  Neurological: Negative for dizziness and syncope.       Objective:   Physical Exam  Constitutional: She appears well-developed and well-nourished.  HENT:  Mouth/Throat: Oropharynx is clear and moist.  Neck: Neck supple. No thyromegaly present.  Cardiovascular: Normal rate and regular rhythm.   Pulmonary/Chest: Effort normal and breath sounds normal. No respiratory distress. She has no wheezes. She has no rales.  Abdominal: Soft. She exhibits no distension and no mass. There is no tenderness. There is no rebound and no guarding.  Musculoskeletal: She exhibits no edema.  Lymphadenopathy:    She has no cervical adenopathy.          Assessment & Plan:  Patient seen today with multiple nonspecific symptoms including nausea without  vomiting, epigastric pain (which has been intermittent for years), occasional pain with swallowing but no true dysphagia, and fatigue. She is concerned because her husband had very similar symptoms and had coronary artery disease. She's never had any chest pain. She is requesting cardiology evaluation and we will set up. She already has GI appointment in February. We'll obtain labs today including TSH, lipase, hepatic panel, and basic metabolic panel.  We have strongly advised that she try to scale back her caffeine use as she has substantial caffeine use in the setting of chronic GERD. She's been intolerant of many medications (esp PPIs) for GERD in the past.

## 2014-12-07 NOTE — Patient Instructions (Addendum)
Nausea, Adult Nausea is the feeling that you have an upset stomach or have to vomit. Nausea by itself is not likely a serious concern, but it may be an early sign of more serious medical problems. As nausea gets worse, it can lead to vomiting. If vomiting develops, there is the risk of dehydration.  CAUSES   Viral infections.  Food poisoning.  Medicines.  Pregnancy.  Motion sickness.  Migraine headaches.  Emotional distress.  Severe pain from any source.  Alcohol intoxication. HOME CARE INSTRUCTIONS  Get plenty of rest.  Ask your caregiver about specific rehydration instructions.  Eat small amounts of food and sip liquids more often.  Take all medicines as told by your caregiver. SEEK MEDICAL CARE IF:  You have not improved after 2 days, or you get worse.  You have a headache. SEEK IMMEDIATE MEDICAL CARE IF:   You have a fever.  You faint.  You keep vomiting or have blood in your vomit.  You are extremely weak or dehydrated.  You have dark or bloody stools.  You have severe chest or abdominal pain. MAKE SURE YOU:  Understand these instructions.  Will watch your condition.  Will get help right away if you are not doing well or get worse. Document Released: 12/18/2004 Document Revised: 08/04/2012 Document Reviewed: 07/23/2011 [facility] Patient Information 2015 [place], [place]. This information is not intended to replace advice given to you by your health care provider. Make sure you discuss any questions you have with your health care provider. Food Choices for Gastroesophageal Reflux Disease When you have gastroesophageal reflux disease (GERD), the foods you eat and your eating habits are very important. Choosing the right foods can help ease the discomfort of GERD. WHAT GENERAL GUIDELINES DO I NEED TO FOLLOW?  Choose fruits, vegetables, whole grains, low-fat dairy products, and low-fat meat, fish, and poultry.  Limit fats such as oils, salad dressings,  butter, nuts, and avocado.  Keep a food diary to identify foods that cause symptoms.  Avoid foods that cause reflux. These may be different for different people.  Eat frequent small meals instead of three large meals each day.  Eat your meals slowly, in a relaxed setting.  Limit fried foods.  Cook foods using methods other than frying.  Avoid drinking alcohol.  Avoid drinking large amounts of liquids with your meals.  Avoid bending over or lying down until 2-3 hours after eating. WHAT FOODS ARE NOT RECOMMENDED? The following are some foods and drinks that may worsen your symptoms: Vegetables Tomatoes. Tomato juice. Tomato and spaghetti sauce. Chili peppers. Onion and garlic. Horseradish. Fruits Oranges, grapefruit, and lemon (fruit and juice). Meats High-fat meats, fish, and poultry. This includes hot dogs, ribs, ham, sausage, salami, and bacon. Dairy Whole milk and chocolate milk. Sour cream. Cream. Butter. Ice cream. Cream cheese.  Beverages Coffee and tea, with or without caffeine. Carbonated beverages or energy drinks. Condiments Hot sauce. Barbecue sauce.  Sweets/Desserts Chocolate and cocoa. Donuts. Peppermint and spearmint. Fats and Oils High-fat foods, including [place] fries and potato chips. Other Vinegar. Strong spices, such as black pepper, white pepper, red pepper, cayenne, curry powder, cloves, ginger, and chili powder. The items listed above may not be a complete list of foods and beverages to avoid. Contact your dietitian for more information. Document Released: 11/10/2005 Document Revised: 11/15/2013 Document Reviewed: 09/14/2013 [facility] Patient Information 2015 [place], [place]. This information is not intended to replace advice given to you by your health care provider. Make sure you discuss any  questions you have with your health care provider.  Try to gradually scale back caffeine use.

## 2014-12-07 NOTE — Progress Notes (Signed)
Pre visit review using our clinic review tool, if applicable. No additional management support is needed unless otherwise documented below in the visit note. 

## 2014-12-14 DIAGNOSIS — H02831 Dermatochalasis of right upper eyelid: Secondary | ICD-10-CM | POA: Diagnosis not present

## 2014-12-14 DIAGNOSIS — H02834 Dermatochalasis of left upper eyelid: Secondary | ICD-10-CM | POA: Diagnosis not present

## 2014-12-14 DIAGNOSIS — Z961 Presence of intraocular lens: Secondary | ICD-10-CM | POA: Diagnosis not present

## 2014-12-14 DIAGNOSIS — E119 Type 2 diabetes mellitus without complications: Secondary | ICD-10-CM | POA: Diagnosis not present

## 2014-12-14 DIAGNOSIS — H40013 Open angle with borderline findings, low risk, bilateral: Secondary | ICD-10-CM | POA: Diagnosis not present

## 2014-12-19 ENCOUNTER — Encounter: Payer: Self-pay | Admitting: Cardiology

## 2014-12-19 DIAGNOSIS — I10 Essential (primary) hypertension: Secondary | ICD-10-CM | POA: Diagnosis not present

## 2014-12-19 DIAGNOSIS — F319 Bipolar disorder, unspecified: Secondary | ICD-10-CM | POA: Diagnosis not present

## 2014-12-19 DIAGNOSIS — E119 Type 2 diabetes mellitus without complications: Secondary | ICD-10-CM | POA: Diagnosis not present

## 2014-12-19 DIAGNOSIS — R079 Chest pain, unspecified: Secondary | ICD-10-CM | POA: Diagnosis not present

## 2014-12-19 DIAGNOSIS — I4519 Other right bundle-branch block: Secondary | ICD-10-CM | POA: Diagnosis not present

## 2014-12-19 NOTE — Progress Notes (Signed)
Patient ID: Cynthia Gilmore, female   DOB: 1934-08-29, 81 y.o.   MRN: [id number]   Cynthia Gilmore    Date of visit:  12/19/2014 DOB:  07-07-1934    Age:  81 yrs. Medical record number:  [id number]     Account number:  [id number] Primary Care Provider: Drema Pry ____________________________ CURRENT DIAGNOSES  1. Chest pain, unspecified  2. Type 2 diabetes mellitus without complications  3. Essential (primary) hypertension  4. Other right bundle-branch block  5. Bipolar disorder, unspecified ____________________________ ALLERGIES  Acetaminophen, Intolerance-unknown  Alprazolam, Drowsiness  Aluminum Hydroxide, Intolerance-diarrhea  Amlodipine, Edema  Aripiprazole, Intolerance-unknown  Azithromycin, Intolerance-unknown  Bupropion, Insomnia  Cefaclor, Mouth sores  Celecoxib, Fatigue  Cephalexin, Intolerance-unknown  Ciprofloxacin, Intolerance-unknown  Citalopram, Memory loss  Clonidine, Headache  Desvenlafaxine, Intolerance-unknown  Diphenoxylate, Intolerance-unknown  Divalproex Sodium, Nausea  Doxycycline, Intolerance-unknown  Entex LA, Nausea  Estrogens, Headache  Fluoxetine, Intolerance-unknown  Furosemide, Intolerance-unknown  Hydrochlorothiazide, Intolerance-unknown  Hydrocodone, Headache  Lamotrigine, Nausea  Lansoprazole, Intolerance-unknown  Lisinopril, Intolerance-cough  Lorazepam, Intolerance-unknown  Meloxicam, Nausea  Methylphenidate, Intolerance-unknown  Metronidazole, Intolerance-unknown  Mirtazapine, Intolerance-unknown  Nabumetone, Nausea  Olmesartan, Intolerance-unknown  Oxycodone, Intolerance-unknown  Pantoprazole, Intolerance-unknown  Paroxetine, Nausea  Quetiapine, Intolerance-unknown  Rabeprazole, Intolerance-diarrhea  Rofecoxib, Intolerance-unknown  Sertraline, Confusion  Sumatriptan, Intolerance-unknown  Telithromycin, Fatigue  Topamax, Confusion  Tramadol, Intolerance-unknown  Venlafaxine, Headache  Verapamil, Headache  vilazodone,  Intolerance-unknown ____________________________ MEDICATIONS  1. cholestyramine (with sugar) 4 gram oral powder, PRN  2. hyoscyamine 0.125 mg sublingual tablet, PRN  3. Imodium A-D 2 mg tablet, PRN  4. metformin 500 mg tablet, BID  5. Fish Oil 300 mg-1,000 mg capsule, BID  6. ranitidine 150 mg capsule, 1 p.o. daily  7. spironolactone 25 mg tablet, 1 p.o. daily  8. valsartan 320 mg tablet, 1 p.o. daily  9. Brintellix 5 mg tablet, 1 p.o. daily ____________________________ CHIEF COMPLAINTS  C/o indigestion  hands numb ____________________________ HISTORY OF PRESENT ILLNESS  This 81 year old female is seen for evaluation of indigestion and mild dyspnea. The patient has a history of diabetes and hypertension. She was evaluated by me about 7 years ago and had a negative myocardial perfusion scan. She has had a history of dysphasia as well as esophageal stricture and had to have her esophagus dilated on several occasions. She has recently developed severe indigestion. She describes it as being present with most any type of food and is present at rest. It is made worse with food and attempts at suppressing it with medicines and results in diarrhea. She is currently on Zantac. She also complains of significant nausea that occurs with her episodes. She has also had some times where she has vertigo and feels as if the room is spinning and that she might pass out but has not had any syncope. She has been under some stress with her husband who recently had bypass grafting. She no longer has significant dyspnea however. She was diagnosed with diabetes a couple of years ago. She also complains that her hands are numb and wakes her up at night. ____________________________ PAST HISTORY  Past Medical Illnesses:  hypertension, DM-non-insulin dependent, TIA, irritable bowel syndrome, GERD;  Cardiovascular Illnesses:  no previous history of cardiac disease.;  Surgical Procedures:  tonsillectomy, cataract  extraction, cholecystectomy, hysterectomy, hernia repair, ankle surgery;  NYHA Classification:  I;  Canadian Angina Classification:  Class 0: Asymptomatic;  Cardiology Procedures-Invasive:  no previous interventional or invasive cardiology procedures;  Cardiology Procedures-Noninvasive:  adenosine cardiolite;  LVEF of 75% documented via nuclear study  on 01/27/2007,   ____________________________ CARDIO-PULMONARY TEST DATES EKG Date:  12/19/2014;  Nuclear Study Date:  01/27/2007;  Echocardiography Date: 11/22/2013;   ____________________________ FAMILY HISTORY Father -- Father dead, Heart disease Mother -- Mother dead, Diabetes mellitus ____________________________ SOCIAL HISTORY Alcohol Use:  no alcohol use;  Smoking:  used to smoke but quit Prior to 1980;  Diet:  low carb;  Lifestyle:  married;  Exercise:  treadmill for approximately 15 minutes 6 days per week;  Occupation:  retired and day Futures trader;  Residence:  lives with husband;   ____________________________ REVIEW OF SYSTEMS General:  weight loss of approximately 10 lbs Eyes: wears eye glasses/contact lenses, cataract extraction bilaterally Ears, Nose, Throat, Mouth:  partial hearing loss, seasonal sinusitis Respiratory: denies dyspnea, cough, wheezing or hemoptysis. Cardiovascular:  please review HPI Abdominal: dysphagia and esophageal strictureGenitourinary-Female: frequency Musculoskeletal:  generalized arthritis Neurological:  numbness and tingling of right arm, vertigo Psychiatric:  anxiety, bipolar disorder has seen a psychiatrist, depression  ____________________________ PHYSICAL EXAMINATION VITAL SIGNS  Blood Pressure:  130/70 Sitting, Left arm, regular cuff  , 136/74 Standing, Left arm and regular cuff   Pulse:  76/min. Weight:  153.00 lbs. Height:  65"BMI: 25  Constitutional:  pleasant white female, in no acute distress Skin:  warm and dry to touch, no apparent skin lesions, or masses noted. Head:  normocephalic, normal  hair pattern, no masses or tenderness Eyes:  EOMS Intact, PERRLA, C and S clear, Funduscopic exam not done. ENT:  ears, nose and throat reveal no gross abnormalities.  Dentition good. Neck:  supple, no masses, thyromegaly, JVD. Carotid pulses are full and equal bilaterally without bruits. Chest:  normal symmetry, clear to auscultation. Cardiac:  regular rhythm, normal S1 and S2, no S3 or S4, grade 1/6 systolic murmur left sternal border Abdomen:  abdomen soft,non-tender, no masses, no hepatospenomegaly, or aneurysm noted Peripheral Pulses:  the femoral,dorsalis pedis, and posterior tibial pulses are full and equal bilaterally with no bruits auscultated. Extremities & Back:  no deformities, clubbing, cyanosis, erythema or edema observed. Normal muscle strength and tone. Neurological:  no gross motor or sensory deficits noted, affect appropriate, oriented x3. ___________________________ IMPRESSIONS/PLAN  1. Indigestion which is atypical for ischemia but in a patient with PVCs and hypertension 2. Episodic presyncope and vertigo that doesn't not sound like it is cardiac 3. Right bundle branch block 4. Numbness of hands that sounds as if it could be peripheral neuropathic or possibly carpal tunnel   Recommendations:  Would recommend that she go ahead and have a myocardial perfusion scan. She also did have a GI consultation to assess her GI complaints. EKG shows sinus rhythm with RBBB. ____________________________ TODAYS ORDERS  1. Treadmill 1 day Cardiolite: First Available  2. 12 Lead EKG: Today                       ____________________________ Cardiology Physician:  Kerry Hough MD [facility]

## 2014-12-28 DIAGNOSIS — R079 Chest pain, unspecified: Secondary | ICD-10-CM | POA: Diagnosis not present

## 2014-12-28 DIAGNOSIS — F319 Bipolar disorder, unspecified: Secondary | ICD-10-CM | POA: Diagnosis not present

## 2014-12-28 DIAGNOSIS — Z01419 Encounter for gynecological examination (general) (routine) without abnormal findings: Secondary | ICD-10-CM | POA: Diagnosis not present

## 2014-12-28 DIAGNOSIS — Z1231 Encounter for screening mammogram for malignant neoplasm of breast: Secondary | ICD-10-CM | POA: Diagnosis not present

## 2014-12-29 ENCOUNTER — Other Ambulatory Visit: Payer: Self-pay | Admitting: Internal Medicine

## 2015-01-02 ENCOUNTER — Ambulatory Visit (INDEPENDENT_AMBULATORY_CARE_PROVIDER_SITE_OTHER): Payer: Medicare Other | Admitting: Internal Medicine

## 2015-01-02 ENCOUNTER — Encounter: Payer: Self-pay | Admitting: Internal Medicine

## 2015-01-02 VITALS — BP 114/58 | HR 70 | Ht 65.5 in | Wt 155.5 lb

## 2015-01-02 DIAGNOSIS — K219 Gastro-esophageal reflux disease without esophagitis: Secondary | ICD-10-CM | POA: Diagnosis not present

## 2015-01-02 DIAGNOSIS — R1310 Dysphagia, unspecified: Secondary | ICD-10-CM

## 2015-01-02 DIAGNOSIS — K589 Irritable bowel syndrome without diarrhea: Secondary | ICD-10-CM | POA: Diagnosis not present

## 2015-01-02 MED ORDER — HYOSCYAMINE SULFATE 0.125 MG PO TBDP: 0.1250 mg | ORAL_TABLET | ORAL | Status: DC | PRN

## 2015-01-02 MED ORDER — SUCRALFATE 1 GM/10ML PO SUSP: 1.0000 g | Freq: Three times a day (TID) | ORAL | Status: DC

## 2015-01-02 MED ORDER — CHOLESTYRAMINE LIGHT 4 G PO PACK: PACK | ORAL | Status: DC

## 2015-01-02 NOTE — Patient Instructions (Addendum)
Follow up with Dr Carlean Purl as needed.    Take 1/4 of a packet of the Prevalite nightly.  We have sent the following medications to your pharmacy for you to pick up at your convenience: Hyoscyamine, carafate  I appreciate the opportunity to care for you. Silvano Rusk, M.D., [facility]

## 2015-01-02 NOTE — Progress Notes (Signed)
   Subjective:    Patient ID: Cynthia Gilmore, female    DOB: 1934-04-13, 81 y.o.   MRN: [id number] Chief complaint is painful swallowing, diarrhea HPI The patient is here complaining of painful swallowing liquids and solids. This is mainly in the right neck area. Similar problems she has had before. She is intolerant of PPIs as they apparently caused diarrhea. She has been using ranitidine but cannot tolerate the one 50 mg dose because of diarrhea so was taking 75 mg. She hasn't been seen here in about 2 years, but she has recurrent irritable bowel problems and reflux related issues. She was also seen in [place] at one point. She is using hyoscyamine to relieve abdominal cramps with success and would like a refill is that medication has expired. She is on cholestyramine but if she takes a whole packet of 4 g she will not move her bowels for 4 days. She had previously been told to use a quarter of a packet at night regularly to prevent swings in her bowels. She will take a packet and use Imodium if she is traveling somewhat. She does not have impact dysphagia. She was under some stress over the last several months as her husband had coronary artery bypass grafting surgery. He is recovering and is here with her today.  Medications, allergies, past medical history, past surgical history, family history and social history are reviewed and updated in the EMR.  Review of Systems As per history of present illness, she has been diagnosed with diabetes, she isn't losing weight and exercising    Objective:   Physical Exam BP 114/58 mmHg  Pulse 70  Ht 5' 5.5" (1.664 m)  Wt 155 lb 8 oz (70.534 kg)  BMI 25.47 kg/m2 Well developed well-nourished no acute distress Neck supple nontender no mass Pharynx clear Abdomen is soft nontender bowel sounds are present no organomegaly or mass    Assessment & Plan:   1. IBS (irritable bowel syndrome)   2. Odynophagia   3. Gastroesophageal reflux disease,  esophagitis presence not specified    The sound like flares of her IBS and reflux disease to me most likely. She's had similar problems before.  1. I will refill her hyoscyamine 2. She is again advised to take a quarter of the cholestyramine packet nightly 3. I have prescribed sucralfate 1 g 3 times a day before meals and at bedtime 4. She will follow up as needed

## 2015-01-05 ENCOUNTER — Telehealth: Payer: Self-pay | Admitting: Internal Medicine

## 2015-01-05 DIAGNOSIS — E119 Type 2 diabetes mellitus without complications: Secondary | ICD-10-CM | POA: Diagnosis not present

## 2015-01-05 DIAGNOSIS — F319 Bipolar disorder, unspecified: Secondary | ICD-10-CM | POA: Diagnosis not present

## 2015-01-05 DIAGNOSIS — I10 Essential (primary) hypertension: Secondary | ICD-10-CM | POA: Diagnosis not present

## 2015-01-05 DIAGNOSIS — R079 Chest pain, unspecified: Secondary | ICD-10-CM | POA: Diagnosis not present

## 2015-01-05 NOTE — Telephone Encounter (Signed)
Spoke with Pharmacy and verbally gave them the order, we had pushed no print at her office visit.  Patient informed that pharmacy has rx now.

## 2015-01-31 ENCOUNTER — Telehealth: Payer: Self-pay | Admitting: Internal Medicine

## 2015-01-31 NOTE — Telephone Encounter (Signed)
Noted; pt scheduled for 3.10.2016

## 2015-01-31 NOTE — Telephone Encounter (Signed)
[facility] [place] [facility] - Client [place] [facility] Patient Name: Cynthia Gilmore DOB: Mar 31, 1934 Initial Comment [name] states having problems with BP, running 92/63- 119/69, no S/S Nurse Assessment Nurse: Donalynn Furlong, RN, Myna Hidalgo Date/Time Eilene Ghazi Time): 01/31/2015 9:47:14 AM Confirm and document reason for call. If symptomatic, describe symptoms. ---[name] states having problems with BP, running 92/63- 119/69, . Diovan 325 mg po q HS, Spirolactone 25 mg po q HS. (current regimen). Asymmetric. Has the patient traveled out of the country within the last 30 days? ---No Does the patient require triage? ---Yes Related visit to physician within the last 2 weeks? ---No Does the PT have any chronic conditions? (i.e. diabetes, asthma, etc.) ---Yes List chronic conditions. ---high blood pressure, diabetes, depression Guidelines Guideline Title Affirmed Question Affirmed Notes Low Blood Pressure [4] Systolic BP 38-381 AND [8] taking blood pressure medications AND [3] NOT dizzy, lightheaded or weak Final Disposition User See Physician within [place], RN, Myna Hidalgo Comments TC back to pt to update appt tomorrow at 10:45 with Dr Maudie Mercury

## 2015-02-01 ENCOUNTER — Encounter: Payer: Self-pay | Admitting: Family Medicine

## 2015-02-01 ENCOUNTER — Ambulatory Visit: Payer: Self-pay | Admitting: Family Medicine

## 2015-02-01 ENCOUNTER — Ambulatory Visit (INDEPENDENT_AMBULATORY_CARE_PROVIDER_SITE_OTHER): Payer: Medicare Other | Admitting: Family Medicine

## 2015-02-01 VITALS — BP 122/70 | HR 60 | Temp 97.8°F | Ht 65.5 in | Wt 153.6 lb

## 2015-02-01 DIAGNOSIS — I10 Essential (primary) hypertension: Secondary | ICD-10-CM

## 2015-02-01 NOTE — Patient Instructions (Signed)
Follow up with your doctor as scheduled.

## 2015-02-01 NOTE — Progress Notes (Signed)
Pre visit review using our clinic review tool, if applicable. No additional management support is needed unless otherwise documented below in the visit note. 

## 2015-02-01 NOTE — Progress Notes (Signed)
HPI:  Cynthia Gilmore is a pleasant 81 yo pt of Dr. Shawna Orleans here for acute visit for:  HTN: -had a few low BP readings at home - usually in 1 teens over 60-70s at home -has been on spironolactone and valsartan for a long time per her report -denies: CP, SOB, HA, presyncope, dizziness, vision changes  ROS: See pertinent positives and negatives per HPI.  Past Medical History  Diagnosis Date  . Anxiety and depression     ? bipolar  . GERD (gastroesophageal reflux disease)   . Hypertension   . TIA (transient ischemic attack) 2000    x 3   . Osteoarthritis (arthritis due to wear and tear of joints)   . IBS (irritable bowel syndrome)   . Segmental colitis     iscemic colitis 2009  . Allergy   . Cataract     bil cataracts removed  . Hiatal hernia   . Lymphocytic colitis 2012  . Hemorrhoids 2012   . Anxiety   . Depression   . Heart murmur   . Stroke     states 2 "'mini strokes" 1980  . Diabetes mellitus, type 2     Past Surgical History  Procedure Laterality Date  . Bladder repair  1995  . Cholecystectomy  2004  . Hernia repair  1995  . Partial hysterectomy  1992    partial  . Ankle surgery  1999    right ankle fracture surgery  . Cataract extraction, bilateral    . Colonoscopy w/ biopsies and polypectomy   07/29/2011    internal hemorrhoids, lymphocytic colitis  . Esophagogastroduodenoscopy  07/29/2011    gastritis,hiatal hernia, tortuous esophagus  . Esophageal manometry      Normal  . Eus  06/03/2012    Procedure: UPPER ENDOSCOPIC ULTRASOUND (EUS) LINEAR;  Surgeon: Milus Banister, MD;  Location: [facility];  Service: Endoscopy;  Laterality: N/A;    Family History  Problem Relation Age of Onset  . Diabetes Mother   . Diabetes Maternal Aunt   . Heart disease Father   . Diabetes Maternal Uncle   . Heart disease Paternal Aunt   . Colon cancer Neg Hx     History   Social History  . Marital Status: Married    Spouse Name: N/A  . Number of Children: 2  .  Years of Education: N/A   Occupational History  . Retired    Social History Main Topics  . Smoking status: Former Smoker    Types: Cigarettes  . Smokeless tobacco: Never Used     Comment: stopped in 1962  . Alcohol Use: No  . Drug Use: No  . Sexual Activity: Not on file   Other Topics Concern  . None   Social History Narrative   Occupation: retired   Patient is a former smoker. -stopped 1962   Alcohol Use - no     Illicit Drug Use - no         Daily Caffeine Use  -1 cup daily           Position roster   Cardiologist-Dr. Tamala Julian   GYN-Dr. Corinna Capra   Ophthalmologist-Dr. Katy Fitch   [place]     Current outpatient prescriptions:  .  Chlorpheniramine-APAP 2-325 MG TABS, Take by mouth., Disp: , Rfl:  .  cholestyramine light (PREVALITE) 4 G packet, 1/4 packet each night, Disp: 60 packet, Rfl: 0 .  glucose blood (ACCU-CHEK SMARTVIEW) test strip, 1 each by Other route daily., Disp:  100 each, Rfl: 12 .  hyoscyamine (ANASPAZ) 0.125 MG TBDP disintergrating tablet, Place 1 tablet (0.125 mg total) under the tongue every 4 (four) hours as needed for cramping., Disp: 90 tablet, Rfl: 5 .  Loperamide HCl (IMODIUM PO), Take 2 tablets by mouth 2 (two) times daily as needed. , Disp: , Rfl:  .  Melatonin 10 MG TABS, Take 10 mg by mouth at bedtime as needed., Disp: , Rfl:  .  metFORMIN (GLUCOPHAGE-XR) 500 MG 24 hr tablet, Take 1 tablet (500 mg total) by mouth 2 (two) times daily with a meal., Disp: 180 tablet, Rfl: 1 .  nystatin-triamcinolone (MYCOLOG II) cream, , Disp: , Rfl: 5 .  Omega-3 Fatty Acids (FISH OIL) 1200 MG CPDR, Take 2 capsules by mouth daily., Disp: , Rfl:  .  ranitidine (ZANTAC) 150 MG tablet, Take 150 mg by mouth daily., Disp: , Rfl:  .  spironolactone (ALDACTONE) 25 MG tablet, TAKE 1 TABLET BY MOUTH DAILY, Disp: 90 tablet, Rfl: 0 .  sucralfate (CARAFATE) 1 GM/10ML suspension, Take 10 mLs (1 g total) by mouth 4 (four) times daily -  with meals and at bedtime., Disp:  420 mL, Rfl: 5 .  valsartan (DIOVAN) 320 MG tablet, TAKE 1 TABLET BY MOUTH DAILY, Disp: 90 tablet, Rfl: 0 .  Vortioxetine HBr (BRINTELLIX) 20 MG TABS, Take 20 mg by mouth daily., Disp: , Rfl:   EXAM:  Filed Vitals:   02/01/15 1044  BP: 122/70  Pulse: 60  Temp: 97.8 F (36.6 C)    Body mass index is 25.16 kg/(m^2).  GENERAL: vitals reviewed and listed above, alert, oriented, appears well hydrated and in no acute distress  HEENT: atraumatic, conjunttiva clear, no obvious abnormalities on inspection of external nose and ears  NECK: no obvious masses on inspection  LUNGS: clear to auscultation bilaterally, no wheezes, rales or rhonchi, good air movement  CV: HRRR, no peripheral edema  MS: moves all extremities without noticeable abnormality  PSYCH: pleasant and cooperative, no obvious depression or anxiety  ASSESSMENT AND PLAN:  Discussed the following assessment and plan:  Essential hypertension  -BP great today, no symptoms  -opted to continue current BP medication -Patient advised to return or notify a doctor immediately if symptoms worsen or persist or new concerns arise.  Patient Instructions  Follow up with your doctor as scheduled     [name].

## 2015-02-26 ENCOUNTER — Telehealth: Payer: Self-pay | Admitting: Internal Medicine

## 2015-02-26 NOTE — Telephone Encounter (Signed)
Ok for alprazolam 0.25 mg 1/2 to 1 tab daily as needed #30.  She has seen psych in the past.  I also suggest she follow up with psych

## 2015-02-26 NOTE — Telephone Encounter (Signed)
Pt called to ask if Dr Shawna Orleans would give her something for her nerves. She said the last time she was in she told him about what was going on with her. She said she has been on XANAX before [place].      Pharmacy ; [address]

## 2015-02-27 MED ORDER — ALPRAZOLAM 0.25 MG PO TABS: ORAL_TABLET | ORAL | Status: DC

## 2015-02-27 NOTE — Telephone Encounter (Signed)
Pt aware, she has an appt with a psychiatrist this month

## 2015-02-28 NOTE — Telephone Encounter (Signed)
Alprazolam on allergy list.  Please advise

## 2015-02-28 NOTE — Telephone Encounter (Signed)
Pt went to pharm to pick up alprazolam and pharm suggest taking ?buspar

## 2015-02-28 NOTE — Telephone Encounter (Signed)
I suggest pt wait until she see her psychiatrist

## 2015-03-01 NOTE — Telephone Encounter (Signed)
Pt aware.  She stated that she has taken the xanax years ago and does not remember the reaction she had but she will try it again.  She will go pick up rx at her pharmacy

## 2015-03-01 NOTE — Telephone Encounter (Signed)
I would not recommend Buspar.  Find out what rxn she has had to alprazolam in the past.  Proceed with rx for alprazolam as long as pt confirms she has no significant negative reaction or side effect to alprazolam.

## 2015-03-01 NOTE — Telephone Encounter (Signed)
Patient stated that she asked Dr Caprice Beaver last month for something for her nerves and Dr Caprice Beaver stated that she would have to get it from her PCP.  She said she really needs something and can't get anyone to give her anything.  Please advise if okay to proceed with Buspar rx.

## 2015-03-26 ENCOUNTER — Other Ambulatory Visit: Payer: Self-pay | Admitting: Internal Medicine

## 2015-03-27 DIAGNOSIS — L989 Disorder of the skin and subcutaneous tissue, unspecified: Secondary | ICD-10-CM | POA: Diagnosis not present

## 2015-03-27 DIAGNOSIS — B351 Tinea unguium: Secondary | ICD-10-CM | POA: Diagnosis not present

## 2015-03-29 ENCOUNTER — Other Ambulatory Visit: Payer: Self-pay | Admitting: Internal Medicine

## 2015-04-02 ENCOUNTER — Telehealth: Payer: Self-pay | Admitting: Internal Medicine

## 2015-04-02 MED ORDER — METFORMIN HCL ER 500 MG PO TB24: 500.0000 mg | ORAL_TABLET | Freq: Two times a day (BID) | ORAL | Status: DC

## 2015-04-02 NOTE — Telephone Encounter (Signed)
[facility] on [facility] [address] [facility] [address] [facility] is requesting re-fill on metFORMIN (GLUCOPHAGE-XR) 500 MG 24 hr tablet

## 2015-04-02 NOTE — Telephone Encounter (Signed)
rx sent in electronically 

## 2015-04-13 ENCOUNTER — Ambulatory Visit: Payer: Medicare Other | Admitting: Internal Medicine

## 2015-04-16 DIAGNOSIS — F411 Generalized anxiety disorder: Secondary | ICD-10-CM | POA: Diagnosis not present

## 2015-04-16 DIAGNOSIS — F331 Major depressive disorder, recurrent, moderate: Secondary | ICD-10-CM | POA: Diagnosis not present

## 2015-05-04 DIAGNOSIS — B351 Tinea unguium: Secondary | ICD-10-CM | POA: Diagnosis not present

## 2015-05-30 ENCOUNTER — Ambulatory Visit (INDEPENDENT_AMBULATORY_CARE_PROVIDER_SITE_OTHER): Payer: Medicare Other | Admitting: Internal Medicine

## 2015-05-30 ENCOUNTER — Encounter: Payer: Self-pay | Admitting: Internal Medicine

## 2015-05-30 ENCOUNTER — Ambulatory Visit: Payer: Medicare Other | Admitting: Internal Medicine

## 2015-05-30 VITALS — BP 120/72 | HR 81 | Temp 98.0°F | Ht 65.5 in | Wt 153.4 lb

## 2015-05-30 DIAGNOSIS — G5602 Carpal tunnel syndrome, left upper limb: Secondary | ICD-10-CM | POA: Diagnosis not present

## 2015-05-30 DIAGNOSIS — R300 Dysuria: Secondary | ICD-10-CM

## 2015-05-30 DIAGNOSIS — K219 Gastro-esophageal reflux disease without esophagitis: Secondary | ICD-10-CM | POA: Diagnosis not present

## 2015-05-30 DIAGNOSIS — E119 Type 2 diabetes mellitus without complications: Secondary | ICD-10-CM | POA: Diagnosis not present

## 2015-05-30 DIAGNOSIS — G5600 Carpal tunnel syndrome, unspecified upper limb: Secondary | ICD-10-CM | POA: Insufficient documentation

## 2015-05-30 LAB — MICROALBUMIN / CREATININE URINE RATIO
Creatinine,U: 87.2 mg/dL
MICROALB UR: 4.5 mg/dL — AB (ref 0.0–1.9)
Microalb Creat Ratio: 5.2 mg/g (ref 0.0–30.0)

## 2015-05-30 LAB — HEMOGLOBIN A1C: Hgb A1c MFr Bld: 6.1 % (ref 4.6–6.5)

## 2015-05-30 LAB — URINALYSIS, ROUTINE W REFLEX MICROSCOPIC
Bilirubin Urine: NEGATIVE
Ketones, ur: NEGATIVE
Nitrite: NEGATIVE
SPECIFIC GRAVITY, URINE: 1.02 (ref 1.000–1.030)
TOTAL PROTEIN, URINE-UPE24: NEGATIVE
URINE GLUCOSE: NEGATIVE
Urobilinogen, UA: 0.2 (ref 0.0–1.0)
pH: 6 (ref 5.0–8.0)

## 2015-05-30 LAB — BASIC METABOLIC PANEL
BUN: 15 mg/dL (ref 6–23)
CHLORIDE: 103 meq/L (ref 96–112)
CO2: 23 meq/L (ref 19–32)
CREATININE: 0.67 mg/dL (ref 0.40–1.20)
Calcium: 9.8 mg/dL (ref 8.4–10.5)
GFR: 90.15 mL/min (ref >60.00)
Glucose, Bld: 166 mg/dL — ABNORMAL HIGH (ref 70–99)
Potassium: 4.1 mEq/L (ref 3.5–5.1)
SODIUM: 139 meq/L (ref 135–145)

## 2015-05-30 MED ORDER — RANITIDINE HCL 150 MG PO TABS: 150.0000 mg | ORAL_TABLET | Freq: Every day | ORAL | Status: DC

## 2015-05-30 NOTE — Assessment & Plan Note (Signed)
Patient experiencing intermittent dysuria.  Check UA with micro.

## 2015-05-30 NOTE — Assessment & Plan Note (Signed)
Patient complains of swelling, tingling and pain predominantly in the left wrist. Her symptoms are nocturnal. Trial of conservative therapy (wrist splint) for 6 weeks.  Home stretching exercises reviewed with patient. If persistent symptoms refer to hand specialist.

## 2015-05-30 NOTE — Progress Notes (Signed)
Subjective:    Patient ID: Tama Headings, female    DOB: 1934/02/22, 81 y.o.   MRN: [id number]  HPI  81 year old white female with history of depression/anxiety, chronic gastroesophageal reflux disease and type 2 diabetes routine follow-up. Patient reports being seen by foot and ankle specialist since previous visit. She was considering using oral medication (antifungal) for onychomycosis. Patient decided not to proceed with therapy. Her liver enzymes were normal. Patient completed genetic studies to determine her response to multiple classes of medications.  Her psychiatrist recently started gabapentin to help with sleep. Patient reports after taking 200 mg dose, it caused worsening depression, upset stomach, nausea and diarrhea. Her symptoms resolved with discontinuation of gabapentin.  She also complains of bilateral hand swelling, tingling and pain. Her symptoms worse in the left wrist versus right. Her symptoms also worse in the evening. Her husband has noticed that she tends to drop objects.  She is followed by gastroenterologist for chronic GERD and dysphagia. Despite using ranitidine she is having refractory symptoms. She has been intolerant of PPIs in the past due to side effect of diarrhea.  Type II diabetes - her home blood sugar readings are stable.  She is tolerating metformin without difficulty.  Review of Systems No weight gain, she complains of intermittent dysuria, no fever or chills   Past Medical History  Diagnosis Date  . Anxiety and depression     ? bipolar  . GERD (gastroesophageal reflux disease)   . Hypertension   . TIA (transient ischemic attack) 2000    x 3   . Osteoarthritis (arthritis due to wear and tear of joints)   . IBS (irritable bowel syndrome)   . Segmental colitis     iscemic colitis 2009  . Allergy   . Cataract     bil cataracts removed  . Hiatal hernia   . Lymphocytic colitis 2012  . Hemorrhoids 2012   . Anxiety   . Depression   .  Heart murmur   . Stroke     states 2 "'mini strokes" 1980  . Diabetes mellitus, type 2     History   Social History  . Marital Status: Married    Spouse Name: N/A  . Number of Children: 2  . Years of Education: N/A   Occupational History  . Retired    Social History Main Topics  . Smoking status: Former Smoker    Types: Cigarettes  . Smokeless tobacco: Never Used     Comment: stopped in 1962  . Alcohol Use: No  . Drug Use: No  . Sexual Activity: Not on file   Other Topics Concern  . Not on file   Social History Narrative   Occupation: retired   Patient is a former smoker. -stopped 1962   Alcohol Use - no     Illicit Drug Use - no         Daily Caffeine Use  -1 cup daily           Position roster   Cardiologist-Dr. Tamala Julian   GYN-Dr. Corinna Capra   Ophthalmologist-Dr. Katy Fitch   Gastroenterologist-Dr. Carlean Purl    Past Surgical History  Procedure Laterality Date  . Bladder repair  1995  . Cholecystectomy  2004  . Hernia repair  1995  . Partial hysterectomy  1992    partial  . Ankle surgery  1999    right ankle fracture surgery  . Cataract extraction, bilateral    . Colonoscopy w/ biopsies and polypectomy  07/29/2011    internal hemorrhoids, lymphocytic colitis  . Esophagogastroduodenoscopy  07/29/2011    gastritis,hiatal hernia, tortuous esophagus  . Esophageal manometry      Normal  . Eus  06/03/2012    Procedure: UPPER ENDOSCOPIC ULTRASOUND (EUS) LINEAR;  Surgeon: Milus Banister, MD;  Location: [facility];  Service: Endoscopy;  Laterality: N/A;    Family History  Problem Relation Age of Onset  . Diabetes Mother   . Diabetes Maternal Aunt   . Heart disease Father   . Diabetes Maternal Uncle   . Heart disease Paternal Aunt   . Colon cancer Neg Hx     Allergies  Allergen Reactions  . Alprazolam     REACTION: sleepy  . Amlodipine Besylate     REACTION: swelling  . Aripiprazole   . Azithromycin   . Bupropion Hcl     REACTION: insomnia  . Cefaclor      REACTION: mouth blisters  . Celecoxib     REACTION: "drained"  . Cephalexin   . Ciprofloxacin   . Citalopram Hydrobromide     REACTION: memory loss  . Clonidine Hydrochloride     REACTION: headache, dizziness  . Desvenlafaxine   . Diphenoxylate-Atropine   . Divalproex Sodium     REACTION: nausea,tremors,fatigue  . Doxycycline   . Entex     REACTION: nausea, headache  . Est Estrogens-Methyltest     REACTION: headache  . Fluoxetine Hcl   . Furosemide   . Hydrochlorothiazide   . Hydrocodone-Acetaminophen     REACTION: headache  . Lamotrigine     REACTION: mood swing,chills, nausea  . Lansoprazole   . Lisinopril     REACTION: cough  . Lorazepam   . Meloxicam     REACTION: nausea, diarrhea  . Methylphenidate Hcl   . Metronidazole   . Mirtazapine     REACTION: swelling  . Nabumetone     REACTION: nausea, headache  . Olmesartan Medoxomil   . Other     Alum and Mag Hydroxide Simeth(Solution) GI Cocktail. Diarrhea   . Oxycodone-Acetaminophen   . Pantoprazole Sodium   . Paroxetine     REACTION: tremor, insomnia, nausea  . Quetiapine   . Rabeprazole Sodium     REACTION: diarrhea  . Rofecoxib   . Sertraline Hcl     REACTION: exreme confusion  . Sumatriptan     REACTION: cardiologist says "no"  . Telithromycin     REACTION: "drained"  . Topamax     Confusion and mood swings  . Tramadol-Acetaminophen   . Venlafaxine     REACTION: hypertension, nausea, headache  . Verapamil     Headaches and hot flashes   . Vilazodone Hcl [Vilazodone Hcl]     Current Outpatient Prescriptions on File Prior to Visit  Medication Sig Dispense Refill  . ALPRAZolam (XANAX) 0.25 MG tablet Take 1/2-1 tablet once daily prn 30 tablet 0  . cholestyramine light (PREVALITE) 4 G packet 1/4 packet each night 60 packet 0  . glucose blood (ACCU-CHEK SMARTVIEW) test strip 1 each by Other route daily. 100 each 12  . hyoscyamine (ANASPAZ) 0.125 MG TBDP disintergrating tablet Place 1 tablet (0.125  mg total) under the tongue every 4 (four) hours as needed for cramping. 90 tablet 5  . Loperamide HCl (IMODIUM PO) Take 2 tablets by mouth 2 (two) times daily as needed.     . metFORMIN (GLUCOPHAGE-XR) 500 MG 24 hr tablet Take 1 tablet (500 mg total) by mouth 2 (two)  times daily with a meal. 180 tablet 1  . nystatin-triamcinolone (MYCOLOG II) cream   5  . ranitidine (ZANTAC) 150 MG tablet Take 150 mg by mouth daily.    Marland Kitchen spironolactone (ALDACTONE) 25 MG tablet TAKE 1 TABLET BY MOUTH DAILY 90 tablet 0  . sucralfate (CARAFATE) 1 GM/10ML suspension Take 10 mLs (1 g total) by mouth 4 (four) times daily -  with meals and at bedtime. 420 mL 5  . valsartan (DIOVAN) 320 MG tablet TAKE 1 TABLET BY MOUTH DAILY 90 tablet 0  . Vortioxetine HBr (BRINTELLIX) 20 MG TABS Take 20 mg by mouth daily.     No current facility-administered medications on file prior to visit.    BP 120/72 mmHg  Pulse 81  Temp(Src) 98 F (36.7 C) (Oral)  Ht 5' 5.5" (1.664 m)  Wt 153 lb 6.4 oz (69.582 kg)  BMI 25.13 kg/m2    Objective:   Physical Exam  Constitutional: She appears well-developed and well-nourished. No distress.  HENT:  Head: Normocephalic and atraumatic.  Cardiovascular: Normal rate, regular rhythm and normal heart sounds.   No murmur heard. Pulmonary/Chest: Effort normal and breath sounds normal. She has no wheezes.  Musculoskeletal: She exhibits no edema.  Neurological:  Positive Phalen's and Tinel's test of left wrist  Skin: Skin is warm and dry.  Psychiatric: She has a normal mood and affect. Her behavior is normal.          Assessment & Plan:

## 2015-05-30 NOTE — Assessment & Plan Note (Signed)
Her home blood sugar readings are stable. Monitor A1c. Check microalbumin / cr ratio.  Continue metformin and carb modified diet. Up to date with pneumovax. Lab Results  Component Value Date   HGBA1C 6.1 08/01/2014   HGBA1C 6.3 04/05/2014   HGBA1C 6.4 01/04/2014   Lab Results  Component Value Date   MICROALBUR 0.8 08/01/2014   LDLCALC 91 07/01/2012   CREATININE 0.56 12/07/2014

## 2015-05-30 NOTE — Patient Instructions (Signed)
Use left wrist splint as directed for 4-6 weeks Follow up with Dr. Carlean Purl Our office will contact you re: blood test results

## 2015-05-30 NOTE — Progress Notes (Signed)
Pre visit review using our clinic review tool, if applicable. No additional management support is needed unless otherwise documented below in the visit note. 

## 2015-05-30 NOTE — Assessment & Plan Note (Signed)
Patient unable to tolerate proton pump inhibitor. Continue same dose ranitidine. Follow-up with Dr. Carlean Purl.  She has required esophageal dilation in the past.

## 2015-06-01 ENCOUNTER — Other Ambulatory Visit: Payer: Self-pay | Admitting: Internal Medicine

## 2015-06-07 DIAGNOSIS — R300 Dysuria: Secondary | ICD-10-CM | POA: Diagnosis not present

## 2015-06-07 NOTE — Addendum Note (Signed)
Addended by: Joyce Gross R on: 06/07/2015 12:35 PM   Modules accepted: Orders

## 2015-06-10 LAB — URINE CULTURE

## 2015-06-11 ENCOUNTER — Other Ambulatory Visit: Payer: Self-pay | Admitting: Family Medicine

## 2015-06-11 ENCOUNTER — Telehealth: Payer: Self-pay | Admitting: Internal Medicine

## 2015-06-11 MED ORDER — CEFUROXIME AXETIL 250 MG PO TABS: 250.0000 mg | ORAL_TABLET | Freq: Two times a day (BID) | ORAL | Status: DC

## 2015-06-11 NOTE — Telephone Encounter (Signed)
Misty said she spoke with you and she has a question and would like you to give her a call back.   [phone]

## 2015-06-11 NOTE — Telephone Encounter (Signed)
Spoke to the pt.  She wanted to know how she got e-coli in her kidneys.  Advised that she has a UTI and that the kidneys are part of the urinary tract but we are unsure exactly where the infection is.  Advised that she could have gotten e-coli by not wiping front to back.  She will watch how she cleanses herself after each bathroom use from here on out.

## 2015-06-22 ENCOUNTER — Other Ambulatory Visit: Payer: Self-pay | Admitting: Internal Medicine

## 2015-06-25 ENCOUNTER — Telehealth: Payer: Self-pay | Admitting: Internal Medicine

## 2015-06-25 NOTE — Telephone Encounter (Signed)
Her electrolytes and kidney function stable.  A1c unchanged at 6.1

## 2015-06-25 NOTE — Telephone Encounter (Signed)
Pt call said she had labs done when she saw Dr Shawna Orleans on 05/30/15 and has not heard about her results. Would like a call back .Marland Kitchen

## 2015-06-26 ENCOUNTER — Other Ambulatory Visit: Payer: Self-pay | Admitting: Internal Medicine

## 2015-06-26 NOTE — Telephone Encounter (Signed)
Patient is aware of lab results and would like a copy mailed to home address.

## 2015-06-29 ENCOUNTER — Telehealth: Payer: Self-pay | Admitting: Internal Medicine

## 2015-06-29 NOTE — Telephone Encounter (Signed)
Patient has had diarrhea for the last two days .  She reports that she normally takes 1/4 packet of cholestyramine daily and is still having diarrhea.  She wants to know if she can take a whole packet today.  She is advised to titrate as needed.  She will call back for any additional questions or concerns

## 2015-07-04 DIAGNOSIS — S90851D Superficial foreign body, right foot, subsequent encounter: Secondary | ICD-10-CM | POA: Diagnosis not present

## 2015-07-20 ENCOUNTER — Encounter: Payer: Self-pay | Admitting: Internal Medicine

## 2015-07-20 ENCOUNTER — Ambulatory Visit (INDEPENDENT_AMBULATORY_CARE_PROVIDER_SITE_OTHER): Payer: Medicare Other | Admitting: Internal Medicine

## 2015-07-20 VITALS — BP 124/62 | HR 64 | Ht 64.75 in | Wt 154.5 lb

## 2015-07-20 DIAGNOSIS — R110 Nausea: Secondary | ICD-10-CM

## 2015-07-20 DIAGNOSIS — Z889 Allergy status to unspecified drugs, medicaments and biological substances status: Secondary | ICD-10-CM

## 2015-07-20 DIAGNOSIS — K5289 Other specified noninfective gastroenteritis and colitis: Secondary | ICD-10-CM

## 2015-07-20 DIAGNOSIS — K589 Irritable bowel syndrome without diarrhea: Secondary | ICD-10-CM | POA: Diagnosis not present

## 2015-07-20 DIAGNOSIS — K52832 Lymphocytic colitis: Secondary | ICD-10-CM

## 2015-07-20 HISTORY — DX: Allergy status to unspecified drugs, medicaments and biological substances: Z88.9

## 2015-07-20 MED ORDER — ONDANSETRON HCL 4 MG PO TABS: 4.0000 mg | ORAL_TABLET | Freq: Three times a day (TID) | ORAL | Status: DC

## 2015-07-20 NOTE — Progress Notes (Signed)
   Subjective:    Patient ID: Cynthia Gilmore, female    DOB: 01-14-1934, 81 y.o.   MRN: [id number] Cc: f/u IBS, abd pain, diarrhea HPI Here w/ husband for f/u - ongoing sxs. Prevalite 1/4 qhs got constipated had to wait 5 days w/o defecation. Has used extra Prevalite if goes out (1 tbsp) Stools daytime only Mainly early in day - several stools a day will take a Zantac and loperamide +/- Cannot control it Glucophage and other meds stable doses x long time  She had genetic testing for drug metabolism enzymes tailored to psych meds - report with her - copied  Wt Readings from Last 3 Encounters:  07/20/15 154 lb 8 oz (70.081 kg)  05/30/15 153 lb 6.4 oz (69.582 kg)  02/01/15 153 lb 9.6 oz (69.673 kg)   Medications, allergies, past medical history, past surgical history, family history and social history are reviewed and updated in the EMR.  Review of Systems As above + anxiety    Objective:   Physical Exam BP 124/62 mmHg  Pulse 64  Ht 5' 4.75" (1.645 m)  Wt 154 lb 8 oz (70.081 kg)  BMI 25.90 kg/m2 NAD Abd soft and NT BS + Appropriate mood and affect and alert and oriented     Assessment & Plan:  IBS (irritable bowel syndrome) - Plan: ondansetron (ZOFRAN) 4 MG tablet  Numerous medication allergies and sensitivities  Lymphocytic colitis  Nausea without vomiting - Plan: ondansetron (ZOFRAN) 4 MG tablet  Trial of ondansetron as has been shown to help IBS-D Consider Tx Lymphocytic colitis if could afford budesonide Numerous allergies preclude easy Tx File genetic testing for drug metabolism under labs RTC 2 months Other possible options - Lotronex ???w/ allergies and sensitivities

## 2015-07-20 NOTE — Patient Instructions (Signed)
We have sent the following medications to your pharmacy for you to pick up at your convenience: Generic Zofran  We want you to follow up with Dr Carlean Purl in 2 months.  I appreciate the opportunity to care for you.

## 2015-07-20 NOTE — Assessment & Plan Note (Signed)
She is intolerant to prednisone, could not afford budesonide or had se

## 2015-07-20 NOTE — Assessment & Plan Note (Signed)
Trial of Ondansetron 4mg  Ac/hs to see if it helps diarrhea, nausea, pain (IBS)

## 2015-07-24 ENCOUNTER — Telehealth: Payer: Self-pay | Admitting: Internal Medicine

## 2015-07-24 NOTE — Telephone Encounter (Signed)
Left message for patient to call back  

## 2015-07-25 NOTE — Telephone Encounter (Signed)
Patient reports large BM this am.  She asks if she can adjust the dose of the zofran to prevent constipation.  She does state much better on zofran.  She will try to titrate her zofran to prevent constipation.  She will call back for any additional questions or concerns.

## 2015-08-14 ENCOUNTER — Telehealth: Payer: Self-pay | Admitting: Internal Medicine

## 2015-08-14 NOTE — Telephone Encounter (Signed)
Patient Name: Cynthia Gilmore  DOB: [birth year]    Initial Comment [name] states her BP has been up since Sunday- 164/101. No symptoms.   Nurse Assessment  Nurse: Leilani Merl, RN, Heather Date/Time (Eastern Time): 08/14/2015 9:19:26 AM  Confirm and document reason for call. If symptomatic, describe symptoms. ---[name] states her BP has been up since Sunday- 164/101. No symptoms.  Has the patient traveled out of the country within the last 30 days? ---Not Applicable  Does the patient require triage? ---Yes  Related visit to physician within the last 2 weeks? ---No  Does the PT have any chronic conditions? (i.e. diabetes, asthma, etc.) ---Yes  List chronic conditions. ---HTN, DM     Guidelines    Guideline Title Affirmed Question Affirmed Notes  High Blood Pressure BP ? 160/100    Final Disposition User   See PCP When Office is Open (within 3 days) Standifer, RN, Nira Conn    Comments  Appt made with Dr. Elease Hashimoto on Thursday at 3:30 pm.   Disagree/Comply: Leta Baptist

## 2015-08-16 ENCOUNTER — Ambulatory Visit (INDEPENDENT_AMBULATORY_CARE_PROVIDER_SITE_OTHER): Payer: Medicare Other | Admitting: Family Medicine

## 2015-08-16 ENCOUNTER — Encounter: Payer: Self-pay | Admitting: Family Medicine

## 2015-08-16 VITALS — BP 120/60 | HR 68 | Temp 98.5°F | Ht 64.75 in | Wt 154.0 lb

## 2015-08-16 DIAGNOSIS — I10 Essential (primary) hypertension: Secondary | ICD-10-CM | POA: Diagnosis not present

## 2015-08-16 DIAGNOSIS — Z23 Encounter for immunization: Secondary | ICD-10-CM

## 2015-08-16 DIAGNOSIS — R300 Dysuria: Secondary | ICD-10-CM | POA: Diagnosis not present

## 2015-08-16 LAB — POCT URINALYSIS DIPSTICK
BILIRUBIN UA: NEGATIVE
Glucose, UA: NEGATIVE
Ketones, UA: NEGATIVE
Nitrite, UA: NEGATIVE
PH UA: 6
Protein, UA: NEGATIVE
SPEC GRAV UA: 1.01
Urobilinogen, UA: 0.2

## 2015-08-16 MED ORDER — CEFUROXIME AXETIL 250 MG PO TABS: 250.0000 mg | ORAL_TABLET | Freq: Two times a day (BID) | ORAL | Status: DC

## 2015-08-16 NOTE — Patient Instructions (Signed)
Continue to monitor blood pressure and be in touch if consistently > 150/90.     

## 2015-08-16 NOTE — Progress Notes (Signed)
Pre visit review using our clinic review tool, if applicable. No additional management support is needed unless otherwise documented below in the visit note. 

## 2015-08-16 NOTE — Progress Notes (Signed)
Subjective:    Patient ID: Cynthia Gilmore, female    DOB: [birth year], 81 y.o.   MRN: [id number]  HPI Patient seen for the following concerns:  History of hypertension. She currently takes Diovan and Aldactone. Compliant with therapy. She's had for the past few days some readings between 300 and 923 systolic and 30Q diastolic. No headaches. No dizziness. Increased stressors. Denies any dietary changes. She also has type 2 diabetes which is been very well controlled.    Second issue is couple day history of some lower pelvic pressure. Occasional pain with urination. She had similar symptoms back in July and had Escherichia coli UTI. No fevers or chills. No flank pain. No nausea or vomiting. She apparently does not drink a lot of liquids during the day  Past Medical History  Diagnosis Date  . Anxiety and depression     ? bipolar  . GERD (gastroesophageal reflux disease)   . Hypertension   . TIA (transient ischemic attack) 2000    x 3   . Osteoarthritis (arthritis due to wear and tear of joints)   . IBS (irritable bowel syndrome)   . Segmental colitis     iscemic colitis 2009  . Allergy   . Cataract     bil cataracts removed  . Hiatal hernia   . Lymphocytic colitis 2012  . Hemorrhoids 2012   . Anxiety   . Depression   . Heart murmur   . Stroke     states 2 "'mini strokes" 1980  . Diabetes mellitus, type 2   . Mumerous medication allergies and sensitivities 07/20/2015   Past Surgical History  Procedure Laterality Date  . Bladder repair  1995  . Cholecystectomy  2004  . Hernia repair  1995  . Partial hysterectomy  1992    partial  . Ankle surgery  1999    right ankle fracture surgery  . Cataract extraction, bilateral    . Colonoscopy w/ biopsies and polypectomy   07/29/2011    internal hemorrhoids, lymphocytic colitis  . Esophagogastroduodenoscopy  07/29/2011    gastritis,hiatal hernia, tortuous esophagus  . Esophageal manometry      Normal  . Eus  06/03/2012   Procedure: UPPER ENDOSCOPIC ULTRASOUND (EUS) LINEAR;  Surgeon: Milus Banister, MD;  Location: [facility];  Service: Endoscopy;  Laterality: N/A;    reports that she has quit smoking. Her smoking use included Cigarettes. She has never used smokeless tobacco. She reports that she does not drink alcohol or use illicit drugs. family history includes Diabetes in her maternal aunt, maternal uncle, and mother; Heart disease in her father and paternal aunt. There is no history of Colon cancer. Allergies  Allergen Reactions  . Alprazolam     REACTION: sleepy  . Amlodipine Besylate     REACTION: swelling  . Aripiprazole   . Azithromycin   . Bupropion Hcl     REACTION: insomnia  . Cefaclor     REACTION: mouth blisters  . Celecoxib     REACTION: "drained"  . Cephalexin   . Ciprofloxacin   . Citalopram Hydrobromide     REACTION: memory loss  . Clonidine Hydrochloride     REACTION: headache, dizziness  . Desvenlafaxine   . Diphenoxylate-Atropine   . Divalproex Sodium     REACTION: nausea,tremors,fatigue  . Doxycycline   . Entex     REACTION: nausea, headache  . Est Estrogens-Methyltest     REACTION: headache  . Fluoxetine Hcl   . Furosemide   .  Hydrochlorothiazide   . Hydrocodone-Acetaminophen     REACTION: headache  . Lamotrigine     REACTION: mood swing,chills, nausea  . Lansoprazole   . Lisinopril     REACTION: cough  . Lorazepam   . Meloxicam     REACTION: nausea, diarrhea  . Methylphenidate Hcl   . Metronidazole   . Mirtazapine     REACTION: swelling  . Nabumetone     REACTION: nausea, headache  . Olmesartan Medoxomil   . Other     Alum and Mag Hydroxide Simeth(Solution) GI Cocktail. Diarrhea   . Oxycodone-Acetaminophen   . Pantoprazole Sodium   . Paroxetine     REACTION: tremor, insomnia, nausea  . Quetiapine   . Rabeprazole Sodium     REACTION: diarrhea  . Rofecoxib   . Sertraline Hcl     REACTION: exreme confusion  . Sumatriptan     REACTION:  cardiologist says "no"  . Telithromycin     REACTION: "drained"  . Topamax     Confusion and mood swings  . Tramadol-Acetaminophen   . Venlafaxine     REACTION: hypertension, nausea, headache  . Verapamil     Headaches and hot flashes   . Vilazodone Hcl [Vilazodone Hcl]       Review of Systems  Constitutional: Negative for fatigue.  Eyes: Negative for visual disturbance.  Respiratory: Negative for cough, chest tightness, shortness of breath and wheezing.   Cardiovascular: Negative for chest pain, palpitations and leg swelling.  Genitourinary: Positive for dysuria. Negative for hematuria.  Neurological: Negative for dizziness, seizures, syncope, weakness, light-headedness and headaches.       Objective:   Physical Exam  Constitutional: She is oriented to person, place, and time. She appears well-developed and well-nourished.  HENT:  Mouth/Throat: Oropharynx is clear and moist.  Neck: Neck supple. No thyromegaly present.  Cardiovascular: Normal rate and regular rhythm.   Pulmonary/Chest: Effort normal and breath sounds normal. No respiratory distress. She has no wheezes. She has no rales.  Musculoskeletal: She exhibits no edema.  Neurological: She is alert and oriented to person, place, and time.          Assessment & Plan:  #1 hypertension. She's had a couple of elevated home readings recently but here today right arm seated 130/68 and standing 130/68. We recommended no medication changes at this time. Continue close monitoring #2 dysuria. Check urinalysis. If abnormal, send for culture Urinalysis does show pus cells and some red blood cells. Urine culture sent. She has multiple antibiotics allergies. She did tolerate cefuroxime without difficulty recently. She is written prescription to start if she has any fever or worsening symptoms. Otherwise wait on culture results #3 health maintenance. Patient requesting flu vaccine. No contraindications.

## 2015-08-18 ENCOUNTER — Ambulatory Visit (INDEPENDENT_AMBULATORY_CARE_PROVIDER_SITE_OTHER): Payer: Medicare Other | Admitting: Family Medicine

## 2015-08-18 VITALS — BP 130/70 | HR 82 | Temp 98.3°F | Resp 18 | Ht 65.5 in | Wt 152.2 lb

## 2015-08-18 DIAGNOSIS — M62838 Other muscle spasm: Secondary | ICD-10-CM

## 2015-08-18 DIAGNOSIS — M6249 Contracture of muscle, multiple sites: Secondary | ICD-10-CM

## 2015-08-18 DIAGNOSIS — M436 Torticollis: Secondary | ICD-10-CM

## 2015-08-18 MED ORDER — CYCLOBENZAPRINE HCL 5 MG PO TABS: ORAL_TABLET | ORAL | Status: DC

## 2015-08-18 NOTE — Patient Instructions (Addendum)
Make sure you apply heat to your neck at least 4 times a day for 20 minutes followed by gentle slow stretching.  Make sure you are giving your neck good support overnight while sleeping, consider wearing a horseshoe type pillow.   Torticollis, Acute You have suddenly (acutely) developed a twisted neck (torticollis). This is usually a self-limited condition. CAUSES  Acute torticollis may be caused by malposition, trauma or infection. Most commonly, acute torticollis is caused by sleeping in an awkward position. Torticollis may also be caused by the flexion, extension or twisting of the neck muscles beyond their normal position. Sometimes, the exact cause may not be known. SYMPTOMS  Usually, there is pain and limited movement of the neck. Your neck may twist to one side. DIAGNOSIS  The diagnosis is often made by physical examination. X-rays, CT scans or MRIs may be done if there is a history of trauma or concern of infection. TREATMENT  For a common, stiff neck that develops during sleep, treatment is focused on relaxing the contracted neck muscle. Medications (including shots) may be used to treat the problem. Most cases resolve in several days. Torticollis usually responds to conservative physical therapy. If left untreated, the shortened and spastic neck muscle can cause deformities in the face and neck. Rarely, surgery is required. HOME CARE INSTRUCTIONS   Use over-the-counter and prescription medications as directed by your caregiver.  Do stretching exercises and massage the neck as directed by your caregiver.  Follow up with physical therapy if needed and as directed by your caregiver. SEEK IMMEDIATE MEDICAL CARE IF:   You develop difficulty breathing or noisy breathing (stridor).  You drool, develop trouble swallowing or have pain with swallowing.  You develop numbness or weakness in the hands or feet.  You have changes in speech or vision.  You have problems with urination or bowel  movements.  You have difficulty walking.  You have a fever.  You have increased pain. MAKE SURE YOU:   Understand these instructions.  Will watch your condition.  Will get help right away if you are not doing well or get worse. Document Released: 11/07/2000 Document Revised: 02/02/2012 Document Reviewed: 12/19/2009 [facility] Patient Information 2015 [place], [place]. This information is not intended to replace advice given to you by your health care Onesti Bonfiglio. Make sure you discuss any questions you have with your health care Kennon Encinas.
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
## 2015-08-22 ENCOUNTER — Telehealth: Payer: Self-pay | Admitting: Family Medicine

## 2015-08-22 NOTE — Telephone Encounter (Signed)
Pt would like results from kidney test from 08/16/15

## 2015-08-22 NOTE — Telephone Encounter (Signed)
Clarify if urine cx was done.  I have not seen it resulted.  See if pt is doing better.

## 2015-08-23 ENCOUNTER — Telehealth: Payer: Self-pay | Admitting: Family Medicine

## 2015-08-23 DIAGNOSIS — R300 Dysuria: Secondary | ICD-10-CM | POA: Diagnosis not present

## 2015-08-23 NOTE — Telephone Encounter (Signed)
Per Jacquelynn Cree this was not done. Would you like me to call pt to come back in for a culture?

## 2015-08-23 NOTE — Telephone Encounter (Signed)
Called and spoke with pt a heaviness is gone in the bottom of stomach but her urine is still a dark color.  Advised pt to come into the office today or tomorrow and let the front desk know she needs to go to the lab for a urine culture. Pt verbalized understanding and Cynthia Gilmore is aware.

## 2015-08-23 NOTE — Telephone Encounter (Signed)
Patient states she got a flu shot last Thursday.  She says she had to go to urgent care this week for a stiff neck, and she has been sick with cold symptoms since the weekend.  She said she would like Dr. Erick Blinks nurse to call her about side effects of the shot.

## 2015-08-23 NOTE — Telephone Encounter (Signed)
Would only get culture and repeat urine studies if she has any persistent symptoms

## 2015-08-24 NOTE — Telephone Encounter (Signed)
Spoke to Pt at 4:04 she wants to talk to Dr Elease Hashimoto about it. i will call the pt back on Monday

## 2015-08-26 LAB — URINE CULTURE

## 2015-08-31 ENCOUNTER — Ambulatory Visit: Payer: Medicare Other | Admitting: Internal Medicine

## 2015-09-03 ENCOUNTER — Ambulatory Visit (INDEPENDENT_AMBULATORY_CARE_PROVIDER_SITE_OTHER): Payer: Medicare Other | Admitting: Family Medicine

## 2015-09-03 ENCOUNTER — Encounter: Payer: Self-pay | Admitting: Family Medicine

## 2015-09-03 VITALS — Ht 66.0 in | Wt 151.6 lb

## 2015-09-03 DIAGNOSIS — I10 Essential (primary) hypertension: Secondary | ICD-10-CM | POA: Diagnosis not present

## 2015-09-03 DIAGNOSIS — IMO0002 Reserved for concepts with insufficient information to code with codable children: Secondary | ICD-10-CM

## 2015-09-03 DIAGNOSIS — E1165 Type 2 diabetes mellitus with hyperglycemia: Secondary | ICD-10-CM | POA: Diagnosis not present

## 2015-09-03 DIAGNOSIS — R319 Hematuria, unspecified: Secondary | ICD-10-CM | POA: Diagnosis not present

## 2015-09-03 DIAGNOSIS — E2839 Other primary ovarian failure: Secondary | ICD-10-CM | POA: Diagnosis not present

## 2015-09-03 DIAGNOSIS — Z23 Encounter for immunization: Secondary | ICD-10-CM | POA: Diagnosis not present

## 2015-09-03 DIAGNOSIS — E1149 Type 2 diabetes mellitus with other diabetic neurological complication: Secondary | ICD-10-CM | POA: Diagnosis not present

## 2015-09-03 LAB — POCT URINALYSIS DIPSTICK
Bilirubin, UA: NEGATIVE
Glucose, UA: NEGATIVE
Leukocytes, UA: NEGATIVE
NITRITE UA: NEGATIVE
UROBILINOGEN UA: 0.2
pH, UA: 5.5

## 2015-09-03 NOTE — Patient Instructions (Signed)

## 2015-09-03 NOTE — Progress Notes (Signed)
Pre visit review using our clinic review tool, if applicable. No additional management support is needed unless otherwise documented below in the visit note. 

## 2015-09-03 NOTE — Progress Notes (Signed)
Patient ID: [name], female    DOB: [birth year]  Age: 81 y.o. MRN: [id number]    Subjective:  Subjective HPI Fronie Holstein presents for f/u--  She thought she was having a cpe but she is too early.  She is here for f/u dm, htn, cholesterol.  HPI HYPERTENSION  Blood pressure range-good  Chest pain- no      Dyspnea- no Lightheadedness- no   Edema- no Other side effects - no   Medication compliance: good Low salt diet- yes  DIABETES  Blood Sugar ranges-under 240  Polyuria- no New Visual problems- no Hypoglycemic symptoms- no Other side effects-no Medication compliance - good Last eye exam- due Foot exam- today  HYPERLIPIDEMIA  Medication compliance- good RUQ pain- no  Muscle aches- no Other side effects-no   Review of Systems  Constitutional: Negative for diaphoresis, appetite change, fatigue and unexpected weight change.  Eyes: Negative for pain, redness and visual disturbance.  Respiratory: Negative for cough, chest tightness, shortness of breath and wheezing.   Cardiovascular: Negative for chest pain, palpitations and leg swelling.  Endocrine: Negative for cold intolerance, heat intolerance, polydipsia, polyphagia and polyuria.  Genitourinary: Negative for dysuria, frequency and difficulty urinating.  Neurological: Negative for dizziness, light-headedness, numbness and headaches.    History Past Medical History  Diagnosis Date  . Anxiety and depression     ? bipolar  . GERD (gastroesophageal reflux disease)   . Hypertension   . TIA (transient ischemic attack) 2000    x 3   . Osteoarthritis (arthritis due to wear and tear of joints)   . IBS (irritable bowel syndrome)   . Segmental colitis ([place])     iscemic colitis 2009  . Allergy   . Cataract     bil cataracts removed  . Hiatal hernia   . Lymphocytic colitis 2012  . Hemorrhoids 2012   . Anxiety   . Depression   . Heart murmur   . Stroke [facility])     states 2 "'mini strokes" 1980  . Diabetes  mellitus, type 2 ([place])   . Mumerous medication allergies and sensitivities 07/20/2015    She has past surgical history that includes Bladder repair (1995); Cholecystectomy (2004); Hernia repair (1995); Partial hysterectomy (1992); Ankle surgery (1999); Cataract extraction, bilateral; Colonoscopy w/ biopsies and polypectomy ( 07/29/2011); Esophagogastroduodenoscopy (07/29/2011); Esophageal manometry; and EUS (06/03/2012).   Her family history includes Diabetes in her maternal aunt, maternal uncle, and mother; Heart disease in her father and paternal aunt. There is no history of Colon cancer.She reports that she has quit smoking. Her smoking use included Cigarettes. She has never used smokeless tobacco. She reports that she does not drink alcohol or use illicit drugs.  Current Outpatient Prescriptions on File Prior to Visit  Medication Sig Dispense Refill  . ALPRAZolam (XANAX) 0.25 MG tablet Take 1/2-1 tablet once daily prn 30 tablet 0  . AMBULATORY NON FORMULARY MEDICATION Luminite natural sleep support Take 1 tablet at bedtime as needed    . citalopram (CELEXA) 10 MG tablet Take 10 mg by mouth daily.    . Coenzyme Q10 (COQ10) 100 MG CAPS Take 1 tablet by mouth daily.    . cyclobenzaprine (FLEXERIL) 5 MG tablet Take 1/2 tab po q4 hrs prn muscle spasm, then use 1-2 tabs po qhs for left neck muscle spasm 60 tablet 0  . glucose blood (ACCU-CHEK SMARTVIEW) test strip 1 each by Other route daily. 100 each 12  . hyoscyamine (ANASPAZ) 0.125 MG TBDP  disintergrating tablet Place 1 tablet (0.125 mg total) under the tongue every 4 (four) hours as needed for cramping. 90 tablet 5  . metFORMIN (GLUCOPHAGE-XR) 500 MG 24 hr tablet Take 1 tablet (500 mg total) by mouth 2 (two) times daily with a meal. 180 tablet 1  . ondansetron (ZOFRAN) 4 MG tablet Take 1 tablet (4 mg total) by mouth 4 (four) times daily -  before meals and at bedtime. 120 tablet 2  . ranitidine (ZANTAC) 150 MG tablet Take 1 tablet (150 mg total) by  mouth daily. 90 tablet 3  . spironolactone (ALDACTONE) 25 MG tablet TAKE 1 TABLET BY MOUTH DAILY 90 tablet 0  . valsartan (DIOVAN) 320 MG tablet TAKE 1 TABLET BY MOUTH DAILY 90 tablet 1   No current facility-administered medications on file prior to visit.     Objective:  Objective Physical Exam  Constitutional: She is oriented to person, place, and time. She appears well-developed and well-nourished.  HENT:  Head: Normocephalic and atraumatic.  Eyes: Conjunctivae and EOM are normal.  Neck: Normal range of motion. Neck supple. No JVD present. Carotid bruit is not present. No thyromegaly present.  Cardiovascular: Normal rate, regular rhythm and normal heart sounds.   No murmur heard. Pulmonary/Chest: Effort normal and breath sounds normal. No respiratory distress. She has no wheezes. She has no rales. She exhibits no tenderness.  Musculoskeletal: She exhibits no edema.  Neurological: She is alert and oriented to person, place, and time.  Psychiatric: She has a normal mood and affect. Her behavior is normal. Judgment and thought content normal.   Ht $R'5\' 6"'rb$  (1.676 m)  Wt 151 lb 9.6 oz (68.765 kg)  BMI 24.48 kg/m2 Wt Readings from Last 3 Encounters:  09/03/15 151 lb 9.6 oz (68.765 kg)  08/18/15 152 lb 4 oz (69.06 kg)  08/16/15 154 lb (69.854 kg)     Lab Results  Component Value Date   WBC 8.0 01/21/2013   HGB 16.0* 01/21/2013   HCT 46.5* 01/21/2013   PLT 222.0 01/21/2013   GLUCOSE 166* 05/30/2015   CHOL 179 07/01/2012   TRIG 196.0* 07/01/2012   HDL 48.50 07/01/2012   LDLCALC 91 07/01/2012   ALT 17 12/07/2014   AST 22 12/07/2014   NA 139 05/30/2015   K 4.1 05/30/2015   CL 103 05/30/2015   CREATININE 0.67 05/30/2015   BUN 15 05/30/2015   CO2 23 05/30/2015   TSH 1.59 12/07/2014   INR 0.9 10/11/2007   HGBA1C 6.1 05/30/2015   MICROALBUR 4.5* 05/30/2015    Ct Chest Wo Contrast  10/14/2013   CLINICAL DATA:  Chronic cough.  EXAM: CT CHEST WITHOUT CONTRAST  TECHNIQUE:  Multidetector CT imaging of the chest was performed following the standard protocol without IV contrast.  COMPARISON:  12/28/2009.  FINDINGS: The chest wall is unremarkable and stable. No breast masses, supraclavicular or axillary mass or adenopathy. Small scattered axillary lymph nodes are noted. The bony thorax is intact. No destructive bone lesions or spinal canal compromise.  The heart is normal in size. No pericardial effusion. Coronary artery calcifications are noted. No mediastinal or hilar mass or adenopathy. Mild fusiform enlargement of the ascending aorta but no focal aneurysm. Mild atherosclerotic calcifications at the aortic arch and branch vessel ostia. The esophagus is grossly normal.  Examination of the lung parenchyma demonstrates a small, 3 mm subpleural nodule in the right upper lobe on image 11. This is stable. No other worrisome pulmonary lesions. No acute pulmonary findings. No pleural effusion.  Minimal dependent bibasilar subsegmental atelectasis.  The upper abdomen is unremarkable.  IMPRESSION: No acute or significant pulmonary findings. No infiltrates, edema, effusions, interstitial lung disease or bronchiectasis.  Unremarkable appearance of the heart and great vessels without contrast.   Electronically Signed   By: Kalman Jewels M.D.   On: 10/14/2013 14:28   Corrigan Cm  10/14/2013   CLINICAL DATA:  Chronic cough and sinus congestion  EXAM: CT PARANASAL SINUS LIMITED WITHOUT CONTRAST  TECHNIQUE: Non-contiguous multidetector CT images of the paranasal sinuses were obtained in a single plane without contrast.  COMPARISON:  CT head 07/30/2010.  FINDINGS: Slight mucosal thickening right frontal sinus. No fluid in the left frontal sinus.  Clear bilateral anterior and posterior ethmoid air cells.  Minimal layering fluid right division sphenoid sinus.  No maxillary sinus fluid.  IMPRESSION: Minor changes of sinusitis. Minimal layering fluid right division sphenoid and slight  mucosal thickening right frontal sinus.   Electronically Signed   By: Rolla Flatten M.D.   On: 10/14/2013 13:40     Assessment & Plan:  Plan I have discontinued Ms. Lybrand's Loperamide HCl (IMODIUM PO), cholestyramine light, and cefUROXime. I am also having her maintain her glucose blood, hyoscyamine, ALPRAZolam, metFORMIN, ranitidine, valsartan, spironolactone, CoQ10, AMBULATORY NON FORMULARY MEDICATION, ondansetron, citalopram, and cyclobenzaprine.  No orders of the defined types were placed in this encounter.    Problem List Items Addressed This Visit    Essential hypertension   Relevant Orders   Comp Met (CMET)   CBC with Differential/Platelet   Hemoglobin A1c   Lipid panel   Microalbumin / creatinine urine ratio   POCT urinalysis dipstick (Completed)    Other Visit Diagnoses    Estrogen deficiency    -  Primary    Relevant Orders    DG Bone Density    Comp Met (CMET)    CBC with Differential/Platelet    Hemoglobin A1c    Lipid panel    Microalbumin / creatinine urine ratio    POCT urinalysis dipstick (Completed)    Uncontrolled type 2 diabetes mellitus with other neurologic complication ([place])        Need for vaccination with 13-polyvalent pneumococcal conjugate vaccine        Relevant Orders    Pneumococcal conjugate vaccine 13-valent IM (Completed)    Blood in urine        Relevant Orders    Urine culture       Follow-up: Return in about 3 months (around 12/04/2015), or if symptoms worsen or fail to improve, for annual exam, fasting.  Garnet Koyanagi, DO

## 2015-09-03 NOTE — Assessment & Plan Note (Signed)
con't metformin Check labs 

## 2015-09-04 LAB — COMPREHENSIVE METABOLIC PANEL
ALBUMIN: 4.5 g/dL (ref 3.5–5.2)
ALK PHOS: 65 U/L (ref 39–117)
ALT: 13 U/L (ref 0–35)
AST: 16 U/L (ref 0–37)
BILIRUBIN TOTAL: 0.3 mg/dL (ref 0.2–1.2)
BUN: 16 mg/dL (ref 6–23)
CO2: 22 mEq/L (ref 19–32)
Calcium: 9.8 mg/dL (ref 8.4–10.5)
Chloride: 106 mEq/L (ref 96–112)
Creatinine, Ser: 0.6 mg/dL (ref 0.40–1.20)
GFR: 102.32 mL/min (ref >60.00)
GLUCOSE: 84 mg/dL (ref 70–99)
Potassium: 4.1 mEq/L (ref 3.5–5.1)
Sodium: 140 mEq/L (ref 135–145)
Total Protein: 7.5 g/dL (ref 6.0–8.3)

## 2015-09-04 LAB — LIPID PANEL
Cholesterol: 201 mg/dL — ABNORMAL HIGH (ref 0–200)
HDL: 44 mg/dL (ref >39.00)
LDL Cholesterol: 127 mg/dL — ABNORMAL HIGH (ref 0–99)
NonHDL: 157.22
TRIGLYCERIDES: 151 mg/dL — AB (ref 0.0–149.0)
Total CHOL/HDL Ratio: 5
VLDL: 30.2 mg/dL (ref 0.0–40.0)

## 2015-09-04 LAB — CBC WITH DIFFERENTIAL/PLATELET
BASOS ABS: 0 10*3/uL (ref 0.0–0.1)
Basophils Relative: 0.5 % (ref 0.0–3.0)
Eosinophils Absolute: 0.1 10*3/uL (ref 0.0–0.7)
Eosinophils Relative: 1.6 % (ref 0.0–5.0)
HCT: 42.2 % (ref 36.0–46.0)
Hemoglobin: 13.9 g/dL (ref 12.0–15.0)
LYMPHS ABS: 3 10*3/uL (ref 0.7–4.0)
Lymphocytes Relative: 35 % (ref 12.0–46.0)
MCHC: 33 g/dL (ref 30.0–36.0)
MCV: 88.3 fl (ref 78.0–100.0)
MONO ABS: 1 10*3/uL (ref 0.1–1.0)
MONOS PCT: 11 % (ref 3.0–12.0)
NEUTROS PCT: 51.9 % (ref 43.0–77.0)
Neutro Abs: 4.5 10*3/uL (ref 1.4–7.7)
Platelets: 258 10*3/uL (ref 150.0–400.0)
RBC: 4.78 Mil/uL (ref 3.87–5.11)
RDW: 13.2 % (ref 11.5–15.5)
WBC: 8.6 10*3/uL (ref 4.0–10.5)

## 2015-09-04 LAB — URINE CULTURE

## 2015-09-04 LAB — HEMOGLOBIN A1C: HEMOGLOBIN A1C: 6 % (ref 4.6–6.5)

## 2015-09-05 LAB — MICROALBUMIN / CREATININE URINE RATIO
Creatinine,U: 233.3 mg/dL
MICROALB/CREAT RATIO: 0.9 mg/g (ref 0.0–30.0)
Microalb, Ur: 2.1 mg/dL — ABNORMAL HIGH (ref 0.0–1.9)

## 2015-09-10 ENCOUNTER — Ambulatory Visit (HOSPITAL_BASED_OUTPATIENT_CLINIC_OR_DEPARTMENT_OTHER)
Admission: RE | Admit: 2015-09-10 | Discharge: 2015-09-10 | Disposition: A | Discharge status: Home / Self Care | Payer: Medicare Other | Source: Ambulatory Visit | Attending: Family Medicine | Admitting: Family Medicine

## 2015-09-10 DIAGNOSIS — Z87891 Personal history of nicotine dependence: Secondary | ICD-10-CM | POA: Diagnosis not present

## 2015-09-10 DIAGNOSIS — E2839 Other primary ovarian failure: Secondary | ICD-10-CM | POA: Diagnosis not present

## 2015-09-10 DIAGNOSIS — M8580 Other specified disorders of bone density and structure, unspecified site: Secondary | ICD-10-CM | POA: Diagnosis not present

## 2015-09-10 DIAGNOSIS — M85852 Other specified disorders of bone density and structure, left thigh: Secondary | ICD-10-CM | POA: Diagnosis not present

## 2015-09-10 DIAGNOSIS — Z90710 Acquired absence of both cervix and uterus: Secondary | ICD-10-CM | POA: Insufficient documentation

## 2015-09-11 ENCOUNTER — Telehealth: Payer: Self-pay

## 2015-09-11 MED ORDER — LOVASTATIN 20 MG PO TABS: 20.0000 mg | ORAL_TABLET | Freq: Every day | ORAL | Status: DC

## 2015-09-11 NOTE — Telephone Encounter (Signed)
-----   Message from Rosalita Chessman, DO sent at 09/06/2015  2:48 PM EDT ----- DM controlled Cholesterol--- LDL goal < 70,  HDL >40,  TG < 150.  Diet and exercise will increase HDL and decrease LDL and TG.  Fish,  Fish Oil, Flaxseed oil will also help increase the HDL and decrease Triglycerides.   Lovastatin 20 mg ($4 at [facility] and [facility]) Recheck labs in 3 months.      Lipid, cmp, hgba1c microalbumin elevated---- should get 24 h urine for protein

## 2015-09-11 NOTE — Telephone Encounter (Signed)
Spoke with patient and she verbalized understanding, she has agreed to take the Lovastatin, but wants it to go to [facility]. She will come by the office to pick up the urine container this week.    KP

## 2015-09-14 ENCOUNTER — Other Ambulatory Visit: Payer: Self-pay | Admitting: Emergency Medicine

## 2015-09-14 ENCOUNTER — Other Ambulatory Visit (INDEPENDENT_AMBULATORY_CARE_PROVIDER_SITE_OTHER): Payer: Medicare Other

## 2015-09-14 DIAGNOSIS — R809 Proteinuria, unspecified: Secondary | ICD-10-CM | POA: Diagnosis not present

## 2015-09-15 LAB — PROTEIN, URINE, 24 HOUR
PROTEIN, URINE: 15 mg/dL (ref 5–24)
Protein, 24H Urine: 158 mg/24 h — ABNORMAL HIGH (ref <150)

## 2015-09-17 ENCOUNTER — Other Ambulatory Visit: Payer: Medicare Other | Admitting: Family Medicine

## 2015-09-21 ENCOUNTER — Other Ambulatory Visit: Payer: Self-pay | Admitting: Internal Medicine

## 2015-09-25 ENCOUNTER — Ambulatory Visit (INDEPENDENT_AMBULATORY_CARE_PROVIDER_SITE_OTHER): Payer: Medicare Other | Admitting: Internal Medicine

## 2015-09-25 ENCOUNTER — Encounter: Payer: Self-pay | Admitting: Internal Medicine

## 2015-09-25 VITALS — BP 110/60 | HR 72 | Ht 65.5 in | Wt 151.0 lb

## 2015-09-25 DIAGNOSIS — R1310 Dysphagia, unspecified: Secondary | ICD-10-CM | POA: Diagnosis not present

## 2015-09-25 DIAGNOSIS — K589 Irritable bowel syndrome without diarrhea: Secondary | ICD-10-CM | POA: Diagnosis not present

## 2015-09-25 DIAGNOSIS — R0789 Other chest pain: Secondary | ICD-10-CM | POA: Diagnosis not present

## 2015-09-25 MED ORDER — HYOSCYAMINE SULFATE 0.125 MG PO TBDP: 0.1250 mg | ORAL_TABLET | ORAL | Status: DC | PRN

## 2015-09-25 NOTE — Progress Notes (Signed)
   Subjective:    Patient ID: Cynthia Gilmore, female    DOB: [birth year], 81 y.o.   MRN: [id number] Cc: diarrhea f/u, "choking"  HPI Here w/ husband - she reports good response to use of ondansetron for IBS-diarrhea. Actually has been constipated some and reduced frequency of doses. She describes some painful swallowing and "choking" at times. No overt dysphagia. All/most liquids cause problems and she usually drinks cold fluids. Medications, allergies, past medical history, past surgical history, family history and social history are reviewed and updated in the EMR.  Review of Systems s above    Objective:   Physical Exam BP 110/60 mmHg  Pulse 72  Ht 5' 5.5" (1.664 m)  Wt 151 lb (68.493 kg)  BMI 24.74 kg/m2 NAD Appropriate mood and affect     Assessment & Plan:  Odynophagia  Other chest pain  IBS (irritable bowel syndrome)   Avoid cold liquids - suspect dysmotility as part of her sxs or all. Trial of Levsin SL before meals Has had EGD, dilation and NL mano in past - on SSRI so trazodone and nortriptyline not good options (QTcan be prolonged) - but these do help esophageal sxs like she has so hold in reserve but would need to coordinate with prescriber of SSRI Continue ondansetron for IBS D

## 2015-09-25 NOTE — Patient Instructions (Addendum)
   Avoid cold liquids per Dr Carlean Purl.   We have sent the following medications to your pharmacy for you to pick up at your convenience: Anaspaz    I appreciate the opportunity to care for you. Silvano Rusk, MD, [facility]

## 2015-09-30 ENCOUNTER — Encounter: Payer: Self-pay | Admitting: Internal Medicine

## 2015-10-01 ENCOUNTER — Other Ambulatory Visit: Payer: Self-pay | Admitting: Family Medicine

## 2015-11-27 ENCOUNTER — Ambulatory Visit (INDEPENDENT_AMBULATORY_CARE_PROVIDER_SITE_OTHER): Payer: Medicare Other | Admitting: Family Medicine

## 2015-11-27 ENCOUNTER — Encounter: Payer: Self-pay | Admitting: Family Medicine

## 2015-11-27 VITALS — BP 106/65 | HR 89 | Temp 97.9°F | Ht 65.5 in | Wt 151.4 lb

## 2015-11-27 DIAGNOSIS — J0140 Acute pansinusitis, unspecified: Secondary | ICD-10-CM | POA: Diagnosis not present

## 2015-11-27 DIAGNOSIS — J019 Acute sinusitis, unspecified: Secondary | ICD-10-CM | POA: Insufficient documentation

## 2015-11-27 MED ORDER — AMOXICILLIN 875 MG PO TABS: 875.0000 mg | ORAL_TABLET | Freq: Two times a day (BID) | ORAL | Status: DC

## 2015-11-27 MED ORDER — FLUTICASONE PROPIONATE 50 MCG/ACT NA SUSP: 2.0000 | Freq: Every day | NASAL | Status: DC

## 2015-11-27 NOTE — Progress Notes (Signed)
Patient ID: Cynthia Gilmore, female    DOB: Jan 04, 1934  Age: 82 y.o. MRN: [id number]    Subjective:  Subjective HPI [name] presents for c/o sore throat,   Review of Systems  Constitutional: Positive for chills. Negative for fever.  HENT: Positive for congestion, postnasal drip, rhinorrhea, sinus pressure, sneezing and sore throat.   Respiratory: Positive for cough and chest tightness. Negative for wheezing.   Cardiovascular: Negative for chest pain, palpitations and leg swelling.  Allergic/Immunologic: Negative for environmental allergies.    History Past Medical History  Diagnosis Date  . Anxiety and depression     ? bipolar  . GERD (gastroesophageal reflux disease)   . Hypertension   . TIA (transient ischemic attack) 2000    x 3   . Osteoarthritis (arthritis due to wear and tear of joints)   . IBS (irritable bowel syndrome)   . Segmental colitis ([place])     iscemic colitis 2009  . Allergy   . Cataract     bil cataracts removed  . Hiatal hernia   . Lymphocytic colitis 2012  . Hemorrhoids 2012   . Anxiety   . Depression   . Heart murmur   . Stroke [facility])     states 2 "'mini strokes" 1980  . Diabetes mellitus, type 2 ([place])   . Mumerous medication allergies and sensitivities 07/20/2015    She has past surgical history that includes Bladder repair (1995); Cholecystectomy (2004); Hernia repair (1995); Partial hysterectomy (1992); Ankle surgery (1999); Cataract extraction, bilateral; Colonoscopy w/ biopsies and polypectomy ( 07/29/2011); Esophagogastroduodenoscopy (07/29/2011); Esophageal manometry; and EUS (06/03/2012).   Her family history includes Diabetes in her maternal aunt, maternal uncle, and mother; Heart disease in her father and paternal aunt. There is no history of Colon cancer.She reports that she has quit smoking. Her smoking use included Cigarettes. She has never used smokeless tobacco. She reports that she does not drink alcohol or use illicit  drugs.  Current Outpatient Prescriptions on File Prior to Visit  Medication Sig Dispense Refill  . ALPRAZolam (XANAX) 0.25 MG tablet Take 1/2-1 tablet once daily prn 30 tablet 0  . AMBULATORY NON FORMULARY MEDICATION Luminite natural sleep support Take 1 tablet at bedtime as needed    . citalopram (CELEXA) 10 MG tablet Take 20 mg by mouth daily.     . Coenzyme Q10 (COQ10) 100 MG CAPS Take 1 tablet by mouth daily.    Marland Kitchen glucose blood (ACCU-CHEK SMARTVIEW) test strip 1 each by Other route daily. 100 each 12  . hyoscyamine (ANASPAZ) 0.125 MG TBDP disintergrating tablet Place 1 tablet (0.125 mg total) under the tongue every 4 (four) hours as needed for cramping. 90 tablet 5  . metFORMIN (GLUCOPHAGE-XR) 500 MG 24 hr tablet TAKE 1 TABLET BY MOUTH TWICE DAILY WITH MEALS 180 tablet 1  . ondansetron (ZOFRAN) 4 MG tablet Take 1 tablet (4 mg total) by mouth 4 (four) times daily -  before meals and at bedtime. 120 tablet 2  . ranitidine (ZANTAC) 150 MG tablet Take 1 tablet (150 mg total) by mouth daily. 90 tablet 3  . spironolactone (ALDACTONE) 25 MG tablet TAKE 1 TABLET BY MOUTH DAILY 90 tablet 0  . valsartan (DIOVAN) 320 MG tablet TAKE 1 TABLET BY MOUTH DAILY 90 tablet 1   No current facility-administered medications on file prior to visit.     Objective:  Objective Physical Exam  Constitutional: She is oriented to person, place, and time. She appears well-developed and  well-nourished.  HENT:  Right Ear: External ear normal.  Left Ear: External ear normal.  Nose: Right sinus exhibits maxillary sinus tenderness and frontal sinus tenderness. Left sinus exhibits maxillary sinus tenderness and frontal sinus tenderness.  Mouth/Throat: Posterior oropharyngeal erythema present. No oropharyngeal exudate or posterior oropharyngeal edema.  + PND + errythema  Eyes: Conjunctivae are normal. Right eye exhibits no discharge. Left eye exhibits no discharge.  Cardiovascular: Normal rate, regular rhythm and  normal heart sounds.   No murmur heard. Pulmonary/Chest: Effort normal and breath sounds normal. No respiratory distress. She has no wheezes. She has no rales. She exhibits no tenderness.  Musculoskeletal: She exhibits no edema.  Lymphadenopathy:    She has cervical adenopathy.  Neurological: She is alert and oriented to person, place, and time.  Psychiatric: She has a normal mood and affect. Her behavior is normal. Judgment and thought content normal.  Nursing note and vitals reviewed.  BP 106/65 mmHg  Pulse 89  Temp(Src) 97.9 F (36.6 C) (Oral)  Ht 5' 5.5" (1.664 m)  Wt 151 lb 6.4 oz (68.675 kg)  BMI 24.80 kg/m2  SpO2 99% Wt Readings from Last 3 Encounters:  11/27/15 151 lb 6.4 oz (68.675 kg)  09/25/15 151 lb (68.493 kg)  09/03/15 151 lb 9.6 oz (68.765 kg)     Lab Results  Component Value Date   WBC 8.6 09/03/2015   HGB 13.9 09/03/2015   HCT 42.2 09/03/2015   PLT 258.0 09/03/2015   GLUCOSE 84 09/03/2015   CHOL 201* 09/03/2015   TRIG 151.0* 09/03/2015   HDL 44.00 09/03/2015   LDLCALC 127* 09/03/2015   ALT 13 09/03/2015   AST 16 09/03/2015   NA 140 09/03/2015   K 4.1 09/03/2015   CL 106 09/03/2015   CREATININE 0.60 09/03/2015   BUN 16 09/03/2015   CO2 22 09/03/2015   TSH 1.59 12/07/2014   INR 0.9 10/11/2007   HGBA1C 6.0 09/03/2015   MICROALBUR 2.1* 09/03/2015    Dg Bone Density  09/10/2015  EXAM: DUAL X-RAY ABSORPTIOMETRY (DXA) FOR BONE MINERAL DENSITY IMPRESSION: Referring Physician:  Dr. Garnet Koyanagi PATIENT: Name: Cynthia Gilmore Patient ID: [id number] Birth Date: Jun 04, 1934 Height: 65.0 in. Sex: Female Measured: 09/10/2015 Weight: 151.0 lbs. Indications: Caucasian, Hysterectomy, Previous Tobacco User, Secondary Osteoporosis Fractures: Ankle Treatments: ASSESSMENT: The BMD measured at Femur Total Left is 0.818 g/cm2 with a T-score of -1.5. This patient is considered osteopenic according to [facility]) criteria. Site Region Measured Date  Measured Age WHO YA BMD Classification T-score AP Spine L1-L4 09/10/2015 79.6 Normal 0.0 1.185 g/cm2 DualFemur Total Left 09/10/2015 79.6 Osteopenia -1.5 0.818 g/cm2 World Health Organization [facility]) criteria for post-menopausal, Caucasian Women: Normal       T-score at or above -1 SD Osteopenia   T-score between -1 and -2.5 SD Osteoporosis T-score at or below -2.5 SD RECOMMENDATION: [place] recommends that FDA-approved medical therapies be considered in postmenopausal women and men age 17 or older with a: 1. Hip or vertebral (clinical or morphometric) fracture. 2. T-score of < -2.5 at the spine or hip. 3. Ten-year fracture probability by FRAX of 3% or greater for hip fracture or 20% or greater for major osteoporotic fracture. All treatment decisions require clinical judgment and consideration of individual patient factors, including patient preferences, co-morbidities, previous drug use, risk factors not captured in the FRAX model (e.g. falls, vitamin D deficiency, increased bone turnover, interval significant decline in bone density) and possible under - or over-estimation of  fracture risk by FRAX. All patients should ensure an adequate intake of dietary calcium (1200 mg/d) and vitamin D (800 IU daily) unless contraindicated. FOLLOW-UP: People with diagnosed cases of osteoporosis or at high risk for fracture should have regular bone mineral density tests. For patients eligible for Medicare, routine testing is allowed once every 2 years. The testing frequency can be increased to one year for patients who have rapidly progressing disease, those who are receiving or discontinuing medical therapy to restore bone mass, or have additional risk factors. I have reviewed this report and agree with the above findings. [facility] FRAX* 10-year Probability of Fracture Based on femoral neck BMD: DualFemur - Right Major Osteoporotic Fracture: 13.3% Hip Fracture:                3.1% Population:                   [place] (Caucasian) Risk Factors:  Secondary Osteoporosis ASSESSMENT: The probability of a major osteoporotic fracture is 13.3% within the next ten years. The probability of a hip fracture is 3.1% within the next ten years. *FRAX is a Materials engineer of the [employer] of [employer] for Metabolic Bone Disease, a [place] (WHO) [facility]. Electronically Signed   By: Rolm Baptise M.D.   On: 09/10/2015 15:55     Assessment & Plan:  Plan I am having Ms. Fleig start on amoxicillin and fluticasone. I am also having her maintain her glucose blood, ALPRAZolam, ranitidine, valsartan, CoQ10, AMBULATORY NON FORMULARY MEDICATION, ondansetron, citalopram, spironolactone, hyoscyamine, and metFORMIN.  Meds ordered this encounter  Medications  . amoxicillin (AMOXIL) 875 MG tablet    Sig: Take 1 tablet (875 mg total) by mouth 2 (two) times daily.    Dispense:  20 tablet    Refill:  0  . fluticasone (FLONASE) 50 MCG/ACT nasal spray    Sig: Place 2 sprays into both nostrils daily.    Dispense:  16 g    Refill:  6    Problem List Items Addressed This Visit    None    Visit Diagnoses    Acute pansinusitis, recurrence not specified    -  Primary    Relevant Medications    amoxicillin (AMOXIL) 875 MG tablet    fluticasone (FLONASE) 50 MCG/ACT nasal spray       Follow-up: Return if symptoms worsen or fail to improve.  Garnet Koyanagi, DO

## 2015-11-27 NOTE — Progress Notes (Signed)
Pre visit review using our clinic review tool, if applicable. No additional management support is needed unless otherwise documented below in the visit note. 

## 2015-11-27 NOTE — Patient Instructions (Signed)

## 2015-12-13 ENCOUNTER — Telehealth: Payer: Self-pay | Admitting: Family Medicine

## 2015-12-13 NOTE — Telephone Encounter (Signed)
Spoke with patient and she said she was calling about OTC Estroven, she said the box has a minimum of 60 days and she wanted to know how long she should take it. I made her aware per Dr.Lowne up to 5 years.    KP

## 2015-12-13 NOTE — Telephone Encounter (Signed)
[name] name: Self  Can be reached: [phone]   Reason for call: Patient has question about length of time she can take the Estrogen prescribed by Dr. Etter Sjogren. States the box reads 60 days. Plse adv

## 2015-12-13 NOTE — Telephone Encounter (Signed)
Please advise      KP 

## 2015-12-13 NOTE — Telephone Encounter (Signed)
Estrogen is not on her med list-----

## 2015-12-18 DIAGNOSIS — H02831 Dermatochalasis of right upper eyelid: Secondary | ICD-10-CM | POA: Diagnosis not present

## 2015-12-18 DIAGNOSIS — E119 Type 2 diabetes mellitus without complications: Secondary | ICD-10-CM | POA: Diagnosis not present

## 2015-12-18 DIAGNOSIS — H02834 Dermatochalasis of left upper eyelid: Secondary | ICD-10-CM | POA: Diagnosis not present

## 2015-12-18 DIAGNOSIS — H40013 Open angle with borderline findings, low risk, bilateral: Secondary | ICD-10-CM | POA: Diagnosis not present

## 2015-12-18 DIAGNOSIS — H43812 Vitreous degeneration, left eye: Secondary | ICD-10-CM | POA: Diagnosis not present

## 2016-01-01 DIAGNOSIS — N390 Urinary tract infection, site not specified: Secondary | ICD-10-CM | POA: Diagnosis not present

## 2016-01-01 DIAGNOSIS — Z1231 Encounter for screening mammogram for malignant neoplasm of breast: Secondary | ICD-10-CM | POA: Diagnosis not present

## 2016-01-03 ENCOUNTER — Encounter: Payer: Self-pay | Admitting: *Deleted

## 2016-01-03 ENCOUNTER — Telehealth: Payer: Self-pay | Admitting: *Deleted

## 2016-01-03 NOTE — Telephone Encounter (Signed)
Pre-Visit Call completed with patient and chart updated.   Pre-Visit Info documented in Specialty Comments under [employer].    

## 2016-01-04 ENCOUNTER — Ambulatory Visit (INDEPENDENT_AMBULATORY_CARE_PROVIDER_SITE_OTHER): Payer: Medicare Other | Admitting: Family Medicine

## 2016-01-04 ENCOUNTER — Encounter: Payer: Self-pay | Admitting: Family Medicine

## 2016-01-04 VITALS — BP 110/62 | HR 66 | Temp 97.6°F | Ht 66.0 in | Wt 149.8 lb

## 2016-01-04 DIAGNOSIS — I10 Essential (primary) hypertension: Secondary | ICD-10-CM | POA: Diagnosis not present

## 2016-01-04 DIAGNOSIS — M2540 Effusion, unspecified joint: Secondary | ICD-10-CM | POA: Diagnosis not present

## 2016-01-04 DIAGNOSIS — E1140 Type 2 diabetes mellitus with diabetic neuropathy, unspecified: Secondary | ICD-10-CM

## 2016-01-04 DIAGNOSIS — Z0000 Encounter for general adult medical examination without abnormal findings: Secondary | ICD-10-CM

## 2016-01-04 LAB — COMPREHENSIVE METABOLIC PANEL
ALBUMIN: 4.6 g/dL (ref 3.5–5.2)
ALT: 13 U/L (ref 0–35)
AST: 18 U/L (ref 0–37)
Alkaline Phosphatase: 52 U/L (ref 39–117)
BUN: 14 mg/dL (ref 6–23)
CHLORIDE: 102 meq/L (ref 96–112)
CO2: 29 meq/L (ref 19–32)
Calcium: 9.8 mg/dL (ref 8.4–10.5)
Creatinine, Ser: 0.64 mg/dL (ref 0.40–1.20)
GFR: 94.9 mL/min (ref >60.00)
Glucose, Bld: 99 mg/dL (ref 70–99)
POTASSIUM: 4.5 meq/L (ref 3.5–5.1)
SODIUM: 137 meq/L (ref 135–145)
Total Bilirubin: 0.5 mg/dL (ref 0.2–1.2)
Total Protein: 7.7 g/dL (ref 6.0–8.3)

## 2016-01-04 LAB — HEMOGLOBIN A1C: Hgb A1c MFr Bld: 6 % (ref 4.6–6.5)

## 2016-01-04 LAB — LIPID PANEL
CHOL/HDL RATIO: 5
CHOLESTEROL: 207 mg/dL — AB (ref 0–200)
HDL: 45.5 mg/dL (ref >39.00)
LDL Cholesterol: 133 mg/dL — ABNORMAL HIGH (ref 0–99)
NonHDL: 161.4
TRIGLYCERIDES: 143 mg/dL (ref 0.0–149.0)
VLDL: 28.6 mg/dL (ref 0.0–40.0)

## 2016-01-04 LAB — RHEUMATOID FACTOR

## 2016-01-04 MED ORDER — SPIRONOLACTONE 25 MG PO TABS: 25.0000 mg | ORAL_TABLET | Freq: Every day | ORAL | Status: DC

## 2016-01-04 MED ORDER — VALSARTAN 320 MG PO TABS: 320.0000 mg | ORAL_TABLET | Freq: Every day | ORAL | Status: DC

## 2016-01-04 NOTE — Progress Notes (Signed)
Subjective:   Cynthia Gilmore is a 82 y.o. female who presents for Medicare Annual (Subsequent) preventive examination.  Review of Systems:   Review of Systems  Constitutional: Negative for activity change, appetite change and fatigue.  HENT: Negative for hearing loss, congestion, tinnitus and ear discharge.   Eyes: Negative for visual disturbance (see optho q1y -- vision corrected to 20/20 with glasses).  Respiratory: Negative for cough, chest tightness and shortness of breath.   Cardiovascular: Negative for chest pain, palpitations and leg swelling.  Gastrointestinal: Negative for abdominal pain, diarrhea, constipation and abdominal distention.  Genitourinary: Negative for urgency, frequency, decreased urine volume and difficulty urinating.  Musculoskeletal: Negative for back pain, arthralgias and gait problem.  Skin: Negative for color change, pallor and rash.  Neurological: Negative for dizziness, light-headedness, numbness and headaches.  Hematological: Negative for adenopathy. Does not bruise/bleed easily.  Psychiatric/Behavioral: Negative for suicidal ideas, confusion, sleep disturbance, self-injury, dysphoric mood, decreased concentration and agitation.  Pt is able to read and write and can do all ADLs No risk for falling No abuse/ violence in home           Objective:     Vitals: BP 110/62 mmHg  Pulse 66  Temp(Src) 97.6 F (36.4 C) (Oral)  Ht '5\' 6"'$  (1.676 m)  Wt 149 lb 12.8 oz (67.949 kg)  BMI 24.19 kg/m2  SpO2 98% BP 110/62 mmHg  Pulse 66  Temp(Src) 97.6 F (36.4 C) (Oral)  Ht '5\' 6"'$  (1.676 m)  Wt 149 lb 12.8 oz (67.949 kg)  BMI 24.19 kg/m2  SpO2 98% General appearance: alert, cooperative, appears stated age and no distress Head: Normocephalic, without obvious abnormality, atraumatic Eyes: conjunctivae/corneas clear. PERRL, EOM's intact. Fundi benign. Ears: normal TM's and external ear canals both ears Nose: Nares normal. Septum midline. Mucosa  normal. No drainage or sinus tenderness. Throat: lips, mucosa, and tongue normal; teeth and gums normal Neck: no adenopathy, no carotid bruit, no JVD, supple, symmetrical, trachea midline and thyroid not enlarged, symmetric, no tenderness/mass/nodules Back: symmetric, no curvature. ROM normal. No CVA tenderness. Lungs: clear to auscultation bilaterally Breasts: normal appearance, no masses or tenderness Heart: regular rate and rhythm, S1, S2 normal, no murmur, click, rub or gallop Abdomen: soft, non-tender; bowel sounds normal; no masses,  no organomegaly Pelvic: not indicated; status post hysterectomy, negative ROS Extremities: extremities normal, atraumatic, no cyanosis or edema Pulses: 2+ and symmetric Skin: Skin color, texture, turgor normal. No rashes or lesions Lymph nodes: Cervical, supraclavicular, and axillary nodes normal. Neurologic: Alert and oriented X 3, normal strength and tone. Normal symmetric reflexes. Normal coordination and gait Psych- no depression, no anxiety Tobacco History  Smoking status  . Former Smoker  . Types: Cigarettes  Smokeless tobacco  . Never Used    Comment: stopped in 1962     Counseling given: Not Answered   Past Medical History  Diagnosis Date  . Anxiety and depression     ? bipolar  . GERD (gastroesophageal reflux disease)   . Hypertension   . TIA (transient ischemic attack) 2000    x 3   . Osteoarthritis (arthritis due to wear and tear of joints)   . IBS (irritable bowel syndrome)   . Segmental colitis ([place])     iscemic colitis 2009  . Allergy   . Cataract     bil cataracts removed  . Hiatal hernia   . Lymphocytic colitis 2012  . Hemorrhoids 2012   . Anxiety   . Depression   .  Heart murmur   . Stroke [facility])     states 2 "'mini strokes" 1980  . Diabetes mellitus, type 2 ([place])   . Mumerous medication allergies and sensitivities 07/20/2015   Past Surgical History  Procedure Laterality Date  . Bladder repair  1995  .  Cholecystectomy  2004  . Hernia repair  1995  . Partial hysterectomy  1992    partial  . Ankle surgery  1999    right ankle fracture surgery  . Cataract extraction, bilateral    . Colonoscopy w/ biopsies and polypectomy   07/29/2011    internal hemorrhoids, lymphocytic colitis  . Esophagogastroduodenoscopy  07/29/2011    gastritis,hiatal hernia, tortuous esophagus  . Esophageal manometry      Normal  . Eus  06/03/2012    Procedure: UPPER ENDOSCOPIC ULTRASOUND (EUS) LINEAR;  Surgeon: Milus Banister, MD;  Location: [facility] ENDOSCOPY;  Service: Endoscopy;  Laterality: N/A;   Family History  Problem Relation Age of Onset  . Diabetes Mother   . Diabetes Maternal Aunt   . Heart disease Father   . Diabetes Maternal Uncle   . Heart disease Paternal Aunt   . Colon cancer Neg Hx    History  Sexual Activity  . Sexual Activity: Not on file    Outpatient Encounter Prescriptions as of 01/04/2016  Medication Sig  . ALPRAZolam (XANAX) 0.25 MG tablet Take 1/2-1 tablet once daily prn  . anti-nausea (EMETROL) solution Take 5 mLs by mouth as needed for nausea or vomiting.  . Ascorbic Acid (VITAMIN C) 500 MG CAPS Take by mouth.  . Chlorpheniramine-DM (CORICIDIN HBP COUGH/COLD PO) Take by mouth.  . citalopram (CELEXA) 10 MG tablet Take 20 mg by mouth daily.   Marland Kitchen docusate sodium (COLACE) 100 MG capsule Take 100 mg by mouth 2 (two) times daily.  Marland Kitchen glucose blood (ACCU-CHEK SMARTVIEW) test strip 1 each by Other route daily.  . hyoscyamine (ANASPAZ) 0.125 MG TBDP disintergrating tablet Place 1 tablet (0.125 mg total) under the tongue every 4 (four) hours as needed for cramping.  Marland Kitchen ibuprofen (ADVIL,MOTRIN) 200 MG tablet Take 200 mg by mouth every 6 (six) hours as needed.  . metFORMIN (GLUCOPHAGE-XR) 500 MG 24 hr tablet TAKE 1 TABLET BY MOUTH TWICE DAILY WITH MEALS  . Misc Natural Products (ESTROVEN ENERGY PO) Take by mouth.  . Omega-3 Fatty Acids (FISH OIL) 1200 MG CAPS Take by mouth.  . ranitidine (ZANTAC) 150  MG tablet Take 1 tablet (150 mg total) by mouth daily.  Marland Kitchen spironolactone (ALDACTONE) 25 MG tablet Take 1 tablet (25 mg total) by mouth daily.  . valsartan (DIOVAN) 320 MG tablet Take 1 tablet (320 mg total) by mouth daily.  . [DISCONTINUED] spironolactone (ALDACTONE) 25 MG tablet TAKE 1 TABLET BY MOUTH DAILY  . [DISCONTINUED] valsartan (DIOVAN) 320 MG tablet TAKE 1 TABLET BY MOUTH DAILY   No facility-administered encounter medications on file as of 01/04/2016.    Activities of Daily Living In your present state of health, do you have any difficulty performing the following activities: 01/04/2016 01/04/2016  Hearing? N Y  Vision? N N  Difficulty concentrating or making decisions? N Y  Walking or climbing stairs? N N  Dressing or bathing? N N  Doing errands, shopping? N N    Patient Care Team: Rosalita Chessman, DO as PCP - General (Family Medicine) Sheralyn Boatman, MD as Consulting Physician (Psychiatry) Gatha Mayer, MD as Consulting Physician (Gastroenterology) Louretta Shorten, MD as Consulting Physician (Obstetrics and Gynecology) Harrell Gave  Katy Fitch, MD as Consulting Physician (Ophthalmology)    Assessment:    CPE Exercise Activities and Dietary recommendations--    Goals    None     Fall Risk Fall Risk  01/04/2016 01/04/2016 01/04/2016 08/18/2015 08/16/2015  Falls in the past year? No No No No No   Depression Screen PHQ 2/9 Scores 01/04/2016 01/04/2016 01/04/2016 08/18/2015  PHQ - 2 Score 0 0 0 0  PHQ- 9 Score - - - -     Cognitive Testing mmse 30/30  Immunization History  Administered Date(s) Administered  . Influenza Whole 09/21/2007, 08/25/2008, 08/21/2009, 08/15/2010  . Influenza, High Dose Seasonal PF 08/24/2014, 08/16/2015  . Influenza,inj,Quad PF,36+ Mos 08/31/2013, 08/24/2014  . Pneumococcal Conjugate-13 09/03/2015  . Pneumococcal Polysaccharide-23 09/09/1999, 07/15/2013  . Td 09/14/2006  . Zoster 01/11/2009   Screening Tests Health Maintenance  Topic Date Due  .  FOOT EXAM  10/14/2015  . OPHTHALMOLOGY EXAM  11/25/2015  . HEMOGLOBIN A1C  03/03/2016  . INFLUENZA VACCINE  06/24/2016  . TETANUS/TDAP  09/14/2016  . DEXA SCAN  Completed  . ZOSTAVAX  Completed  . PNA vac Low Risk Adult  Completed      Plan:    see AVS During the course of the visit the patient was educated and counseled about the following appropriate screening and preventive services:   Vaccines to include Pneumoccal, Influenza, Hepatitis B, Td, Zostavax, HCV  Electrocardiogram  Cardiovascular Disease  Colorectal cancer screening  Bone density screening  Diabetes screening  Glaucoma screening  Mammography/PAP  Nutrition counseling   Patient Instructions (the written plan) was given to the patient.   1. Essential hypertension stable - valsartan (DIOVAN) 320 MG tablet; Take 1 tablet (320 mg total) by mouth daily.  Dispense: 90 tablet; Refill: 1 - spironolactone (ALDACTONE) 25 MG tablet; Take 1 tablet (25 mg total) by mouth daily.  Dispense: 90 tablet; Refill: 1 - Lipid panel - Hemoglobin A1c - CBC with Differential/Platelet - Comp Met (CMET)  2. Controlled type 2 diabetes mellitus with diabetic neuropathy, without long-term current use of insulin (HCC)   - Lipid panel - Hemoglobin A1c - CBC with Differential/Platelet - Comp Met (CMET)  3. Joint swelling   - Rheumatoid factor - ANA  4. Routine history and physical examination of adult    Garnet Koyanagi, DO  01/04/2016

## 2016-01-04 NOTE — Progress Notes (Signed)
Pre visit review using our clinic review tool, if applicable. No additional management support is needed unless otherwise documented below in the visit note. 

## 2016-01-04 NOTE — Patient Instructions (Addendum)
Preventive Care for Adults, Female A healthy lifestyle and preventive care can promote health and wellness. Preventive health guidelines for women include the following key practices.  A routine yearly physical is a good way to check with your health care provider about your health and preventive screening. It is a chance to share any concerns and updates on your health and to receive a thorough exam.  Visit your dentist for a routine exam and preventive care every 6 months. Brush your teeth twice a day and floss once a day. Good oral hygiene prevents tooth decay and gum disease.  The frequency of eye exams is based on your age, health, family medical history, use of contact lenses, and other factors. Follow your health care provider's recommendations for frequency of eye exams.  Eat a healthy diet. Foods like vegetables, fruits, whole grains, low-fat dairy products, and lean protein foods contain the nutrients you need without too many calories. Decrease your intake of foods high in solid fats, added sugars, and salt. Eat the right amount of calories for you.Get information about a proper diet from your health care provider, if necessary.  Regular physical exercise is one of the most important things you can do for your health. Most adults should get at least 150 minutes of moderate-intensity exercise (any activity that increases your heart rate and causes you to sweat) each week. In addition, most adults need muscle-strengthening exercises on 2 or more days a week.  Maintain a healthy weight. The body mass index (BMI) is a screening tool to identify possible weight problems. It provides an estimate of body fat based on height and weight. Your health care provider can find your BMI and can help you achieve or maintain a healthy weight.For adults 20 years and older:  A BMI below 18.5 is considered underweight.  A BMI of 18.5 to 24.9 is normal.  A BMI of 25 to 29.9 is considered overweight.  A  BMI of 30 and above is considered obese.  Maintain normal blood lipids and cholesterol levels by exercising and minimizing your intake of saturated fat. Eat a balanced diet with plenty of fruit and vegetables. Blood tests for lipids and cholesterol should begin at age 45 and be repeated every 5 years. If your lipid or cholesterol levels are high, you are over 50, or you are at high risk for heart disease, you may need your cholesterol levels checked more frequently.Ongoing high lipid and cholesterol levels should be treated with medicines if diet and exercise are not working.  If you smoke, find out from your health care provider how to quit. If you do not use tobacco, do not start.  Lung cancer screening is recommended for adults aged 45-80 years who are at high risk for developing lung cancer because of a history of smoking. A yearly low-dose CT scan of the lungs is recommended for people who have at least a 30-pack-year history of smoking and are a current smoker or have quit within the past 15 years. A pack year of smoking is smoking an average of 1 pack of cigarettes a day for 1 year (for example: 1 pack a day for 30 years or 2 packs a day for 15 years). Yearly screening should continue until the smoker has stopped smoking for at least 15 years. Yearly screening should be stopped for people who develop a health problem that would prevent them from having lung cancer treatment.  If you are pregnant, do not drink alcohol. If you are  breastfeeding, be very cautious about drinking alcohol. If you are not pregnant and choose to drink alcohol, do not have more than 1 drink per day. One drink is considered to be 12 ounces (355 mL) of beer, 5 ounces (148 mL) of wine, or 1.5 ounces (44 mL) of liquor.  Avoid use of street drugs. Do not share needles with anyone. Ask for help if you need support or instructions about stopping the use of drugs.  High blood pressure causes heart disease and increases the risk  of stroke. Your blood pressure should be checked at least every 1 to 2 years. Ongoing high blood pressure should be treated with medicines if weight loss and exercise do not work.  If you are 55-79 years old, ask your health care provider if you should take aspirin to prevent strokes.  Diabetes screening is done by taking a blood sample to check your blood glucose level after you have not eaten for a certain period of time (fasting). If you are not overweight and you do not have risk factors for diabetes, you should be screened once every 3 years starting at age 45. If you are overweight or obese and you are 40-70 years of age, you should be screened for diabetes every year as part of your cardiovascular risk assessment.  Breast cancer screening is essential preventive care for women. You should practice "breast self-awareness." This means understanding the normal appearance and feel of your breasts and may include breast self-examination. Any changes detected, no matter how small, should be reported to a health care provider. Women in their 20s and 30s should have a clinical breast exam (CBE) by a health care provider as part of a regular health exam every 1 to 3 years. After age 40, women should have a CBE every year. Starting at age 40, women should consider having a mammogram (breast X-ray test) every year. Women who have a family history of breast cancer should talk to their health care provider about genetic screening. Women at a high risk of breast cancer should talk to their health care providers about having an MRI and a mammogram every year.  Breast cancer gene (BRCA)-related cancer risk assessment is recommended for women who have family members with BRCA-related cancers. BRCA-related cancers include breast, ovarian, tubal, and peritoneal cancers. Having family members with these cancers may be associated with an increased risk for harmful changes (mutations) in the breast cancer genes BRCA1 and  BRCA2. Results of the assessment will determine the need for genetic counseling and BRCA1 and BRCA2 testing.  Your health care provider may recommend that you be screened regularly for cancer of the pelvic organs (ovaries, uterus, and vagina). This screening involves a pelvic examination, including checking for microscopic changes to the surface of your cervix (Pap test). You may be encouraged to have this screening done every 3 years, beginning at age 21.  For women ages 30-65, health care providers may recommend pelvic exams and Pap testing every 3 years, or they may recommend the Pap and pelvic exam, combined with testing for human papilloma virus (HPV), every 5 years. Some types of HPV increase your risk of cervical cancer. Testing for HPV may also be done on women of any age with unclear Pap test results.  Other health care providers may not recommend any screening for nonpregnant women who are considered low risk for pelvic cancer and who do not have symptoms. Ask your health care provider if a screening pelvic exam is right for   you.  If you have had past treatment for cervical cancer or a condition that could lead to cancer, you need Pap tests and screening for cancer for at least 20 years after your treatment. If Pap tests have been discontinued, your risk factors (such as having a new sexual partner) need to be reassessed to determine if screening should resume. Some women have medical problems that increase the chance of getting cervical cancer. In these cases, your health care provider may recommend more frequent screening and Pap tests.  Colorectal cancer can be detected and often prevented. Most routine colorectal cancer screening begins at the age of 50 years and continues through age 75 years. However, your health care provider may recommend screening at an earlier age if you have risk factors for colon cancer. On a yearly basis, your health care provider may provide home test kits to check  for hidden blood in the stool. Use of a small camera at the end of a tube, to directly examine the colon (sigmoidoscopy or colonoscopy), can detect the earliest forms of colorectal cancer. Talk to your health care provider about this at age 50, when routine screening begins. Direct exam of the colon should be repeated every 5-10 years through age 75 years, unless early forms of precancerous polyps or small growths are found.  People who are at an increased risk for hepatitis B should be screened for this virus. You are considered at high risk for hepatitis B if:  You were born in a country where hepatitis B occurs often. Talk with your health care provider about which countries are considered high risk.  Your parents were born in a high-risk country and you have not received a shot to protect against hepatitis B (hepatitis B vaccine).  You have HIV or AIDS.  You use needles to inject street drugs.  You live with, or have sex with, someone who has hepatitis B.  You get hemodialysis treatment.  You take certain medicines for conditions like cancer, organ transplantation, and autoimmune conditions.  Hepatitis C blood testing is recommended for all people born from 1945 through 1965 and any individual with known risks for hepatitis C.  Practice safe sex. Use condoms and avoid high-risk sexual practices to reduce the spread of sexually transmitted infections (STIs). STIs include gonorrhea, chlamydia, syphilis, trichomonas, herpes, HPV, and human immunodeficiency virus (HIV). Herpes, HIV, and HPV are viral illnesses that have no cure. They can result in disability, cancer, and death.  You should be screened for sexually transmitted illnesses (STIs) including gonorrhea and chlamydia if:  You are sexually active and are younger than 24 years.  You are older than 24 years and your health care provider tells you that you are at risk for this type of infection.  Your sexual activity has changed  since you were last screened and you are at an increased risk for chlamydia or gonorrhea. Ask your health care provider if you are at risk.  If you are at risk of being infected with HIV, it is recommended that you take a prescription medicine daily to prevent HIV infection. This is called preexposure prophylaxis (PrEP). You are considered at risk if:  You are sexually active and do not regularly use condoms or know the HIV status of your partner(s).  You take drugs by injection.  You are sexually active with a partner who has HIV.  Talk with your health care provider about whether you are at high risk of being infected with HIV. If   you choose to begin PrEP, you should first be tested for HIV. You should then be tested every 3 months for as long as you are taking PrEP.  Osteoporosis is a disease in which the bones lose minerals and strength with aging. This can result in serious bone fractures or breaks. The risk of osteoporosis can be identified using a bone density scan. Women ages 67 years and over and women at risk for fractures or osteoporosis should discuss screening with their health care providers. Ask your health care provider whether you should take a calcium supplement or vitamin D to reduce the rate of osteoporosis.  Menopause can be associated with physical symptoms and risks. Hormone replacement therapy is available to decrease symptoms and risks. You should talk to your health care provider about whether hormone replacement therapy is right for you.  Use sunscreen. Apply sunscreen liberally and repeatedly throughout the day. You should seek shade when your shadow is shorter than you. Protect yourself by wearing long sleeves, pants, a wide-brimmed hat, and sunglasses year round, whenever you are outdoors.  Once a month, do a whole body skin exam, using a mirror to look at the skin on your back. Tell your health care provider of new moles, moles that have irregular borders, moles that  are larger than a pencil eraser, or moles that have changed in shape or color.  Stay current with required vaccines (immunizations).  Influenza vaccine. All adults should be immunized every year.  Tetanus, diphtheria, and acellular pertussis (Td, Tdap) vaccine. Pregnant women should receive 1 dose of Tdap vaccine during each pregnancy. The dose should be obtained regardless of the length of time since the last dose. Immunization is preferred during the 27th-36th week of gestation. An adult who has not previously received Tdap or who does not know her vaccine status should receive 1 dose of Tdap. This initial dose should be followed by tetanus and diphtheria toxoids (Td) booster doses every 10 years. Adults with an unknown or incomplete history of completing a 3-dose immunization series with Td-containing vaccines should begin or complete a primary immunization series including a Tdap dose. Adults should receive a Td booster every 10 years.  Varicella vaccine. An adult without evidence of immunity to varicella should receive 2 doses or a second dose if she has previously received 1 dose. Pregnant females who do not have evidence of immunity should receive the first dose after pregnancy. This first dose should be obtained before leaving the health care facility. The second dose should be obtained 4-8 weeks after the first dose.  Human papillomavirus (HPV) vaccine. Females aged 13-26 years who have not received the vaccine previously should obtain the 3-dose series. The vaccine is not recommended for use in pregnant females. However, pregnancy testing is not needed before receiving a dose. If a female is found to be pregnant after receiving a dose, no treatment is needed. In that case, the remaining doses should be delayed until after the pregnancy. Immunization is recommended for any person with an immunocompromised condition through the age of 61 years if she did not get any or all doses earlier. During the  3-dose series, the second dose should be obtained 4-8 weeks after the first dose. The third dose should be obtained 24 weeks after the first dose and 16 weeks after the second dose.  Zoster vaccine. One dose is recommended for adults aged 30 years or older unless certain conditions are present.  Measles, mumps, and rubella (MMR) vaccine. Adults born  before 1957 generally are considered immune to measles and mumps. Adults born in 1957 or later should have 1 or more doses of MMR vaccine unless there is a contraindication to the vaccine or there is laboratory evidence of immunity to each of the three diseases. A routine second dose of MMR vaccine should be obtained at least 28 days after the first dose for students attending postsecondary schools, health care workers, or international travelers. People who received inactivated measles vaccine or an unknown type of measles vaccine during 1963-1967 should receive 2 doses of MMR vaccine. People who received inactivated mumps vaccine or an unknown type of mumps vaccine before 1979 and are at high risk for mumps infection should consider immunization with 2 doses of MMR vaccine. For females of childbearing age, rubella immunity should be determined. If there is no evidence of immunity, females who are not pregnant should be vaccinated. If there is no evidence of immunity, females who are pregnant should delay immunization until after pregnancy. Unvaccinated health care workers born before 1957 who lack laboratory evidence of measles, mumps, or rubella immunity or laboratory confirmation of disease should consider measles and mumps immunization with 2 doses of MMR vaccine or rubella immunization with 1 dose of MMR vaccine.  Pneumococcal 13-valent conjugate (PCV13) vaccine. When indicated, a person who is uncertain of his immunization history and has no record of immunization should receive the PCV13 vaccine. All adults 65 years of age and older should receive this  vaccine. An adult aged 19 years or older who has certain medical conditions and has not been previously immunized should receive 1 dose of PCV13 vaccine. This PCV13 should be followed with a dose of pneumococcal polysaccharide (PPSV23) vaccine. Adults who are at high risk for pneumococcal disease should obtain the PPSV23 vaccine at least 8 weeks after the dose of PCV13 vaccine. Adults older than 80 years of age who have normal immune system function should obtain the PPSV23 vaccine dose at least 1 year after the dose of PCV13 vaccine.  Pneumococcal polysaccharide (PPSV23) vaccine. When PCV13 is also indicated, PCV13 should be obtained first. All adults aged 65 years and older should be immunized. An adult younger than age 65 years who has certain medical conditions should be immunized. Any person who resides in a nursing home or long-term care facility should be immunized. An adult smoker should be immunized. People with an immunocompromised condition and certain other conditions should receive both PCV13 and PPSV23 vaccines. People with human immunodeficiency virus (HIV) infection should be immunized as soon as possible after diagnosis. Immunization during chemotherapy or radiation therapy should be avoided. Routine use of PPSV23 vaccine is not recommended for American Indians, [place] Natives, or people younger than 65 years unless there are medical conditions that require PPSV23 vaccine. When indicated, people who have unknown immunization and have no record of immunization should receive PPSV23 vaccine. One-time revaccination 5 years after the first dose of PPSV23 is recommended for people aged 19-64 years who have chronic kidney failure, nephrotic syndrome, asplenia, or immunocompromised conditions. People who received 1-2 doses of PPSV23 before age 65 years should receive another dose of PPSV23 vaccine at age 65 years or later if at least 5 years have passed since the previous dose. Doses of PPSV23 are not  needed for people immunized with PPSV23 at or after age 65 years.  Meningococcal vaccine. Adults with asplenia or persistent complement component deficiencies should receive 2 doses of quadrivalent meningococcal conjugate (MenACWY-D) vaccine. The doses should be obtained   at least 2 months apart. Microbiologists working with certain meningococcal bacteria, [place] recruits, people at risk during an outbreak, and people who travel to or live in countries with a high rate of meningitis should be immunized. A first-year college student up through age 34 years who is living in a residence hall should receive a dose if she did not receive a dose on or after her 16th birthday. Adults who have certain high-risk conditions should receive one or more doses of vaccine.  Hepatitis A vaccine. Adults who wish to be protected from this disease, have certain high-risk conditions, work with hepatitis A-infected animals, work in hepatitis A research labs, or travel to or work in countries with a high rate of hepatitis A should be immunized. Adults who were previously unvaccinated and who anticipate close contact with an international adoptee during the first 60 days after arrival in the [place] from a country with a high rate of hepatitis A should be immunized.  Hepatitis B vaccine. Adults who wish to be protected from this disease, have certain high-risk conditions, may be exposed to blood or other infectious body fluids, are household contacts or sex partners of hepatitis B positive people, are clients or workers in certain care facilities, or travel to or work in countries with a high rate of hepatitis B should be immunized.  Haemophilus influenzae type b (Hib) vaccine. A previously unvaccinated person with asplenia or sickle cell disease or having a scheduled splenectomy should receive 1 dose of Hib vaccine. Regardless of previous immunization, a recipient of a hematopoietic stem cell transplant should receive a  3-dose series 6-12 months after her successful transplant. Hib vaccine is not recommended for adults with HIV infection. Preventive Services / Frequency Ages 35 to 4 years  Blood pressure check.** / Every 3-5 years.  Lipid and cholesterol check.** / Every 5 years beginning at age 60.  Clinical breast exam.** / Every 3 years for women in their 71s and 10s.  BRCA-related cancer risk assessment.** / For women who have family members with a BRCA-related cancer (breast, ovarian, tubal, or peritoneal cancers).  Pap test.** / Every 2 years from ages 76 through 26. Every 3 years starting at age 61 through age 76 or 93 with a history of 3 consecutive normal Pap tests.  HPV screening.** / Every 3 years from ages 37 through ages 60 to 51 with a history of 3 consecutive normal Pap tests.  Hepatitis C blood test.** / For any individual with known risks for hepatitis C.  Skin self-exam. / Monthly.  Influenza vaccine. / Every year.  Tetanus, diphtheria, and acellular pertussis (Tdap, Td) vaccine.** / Consult your health care provider. Pregnant women should receive 1 dose of Tdap vaccine during each pregnancy. 1 dose of Td every 10 years.  Varicella vaccine.** / Consult your health care provider. Pregnant females who do not have evidence of immunity should receive the first dose after pregnancy.  HPV vaccine. / 3 doses over 6 months, if 93 and younger. The vaccine is not recommended for use in pregnant females. However, pregnancy testing is not needed before receiving a dose.  Measles, mumps, rubella (MMR) vaccine.** / You need at least 1 dose of MMR if you were born in 1957 or later. You may also need a 2nd dose. For females of childbearing age, rubella immunity should be determined. If there is no evidence of immunity, females who are not pregnant should be vaccinated. If there is no evidence of immunity, females who are  pregnant should delay immunization until after pregnancy.  Pneumococcal  13-valent conjugate (PCV13) vaccine.** / Consult your health care provider.  Pneumococcal polysaccharide (PPSV23) vaccine.** / 1 to 2 doses if you smoke cigarettes or if you have certain conditions.  Meningococcal vaccine.** / 1 dose if you are age 6 to 57 years and a Market researcher living in a residence hall, or have one of several medical conditions, you need to get vaccinated against meningococcal disease. You may also need additional booster doses.  Hepatitis A vaccine.** / Consult your health care provider.  Hepatitis B vaccine.** / Consult your health care provider.  Haemophilus influenzae type b (Hib) vaccine.** / Consult your health care provider. Ages 30 to 4 years  Blood pressure check.** / Every year.  Lipid and cholesterol check.** / Every 5 years beginning at age 59 years.  Lung cancer screening. / Every year if you are aged 62-80 years and have a 30-pack-year history of smoking and currently smoke or have quit within the past 15 years. Yearly screening is stopped once you have quit smoking for at least 15 years or develop a health problem that would prevent you from having lung cancer treatment.  Clinical breast exam.** / Every year after age 27 years.  BRCA-related cancer risk assessment.** / For women who have family members with a BRCA-related cancer (breast, ovarian, tubal, or peritoneal cancers).  M++++++++++++++++++++++++++++++++++++++++++++++++++++++++++++++++++++++++++++++++++++++++++++++++++++++++++++++++++++++++++++++++++++++++++++++++++++++++++++++++++++++++++++++++++++++++++++++++++++++++++++++++++++++++++++++++++++++++++++++++++++++++++++++++++++++++++++++++++++++++++++++++++++++++++++++++++++++++++++++++++++++++++++++++++++++++++++++++++++++++++++++++++++++++++++++++++++++++++++++++++++++++++++++++++++++++++++++++++++++++++++++++++++++++++++++++++++++++++++++++++++++++++++++++++++++++++++++++++++++++++++++++++++++  Pap test.** / Every 3 years  starting at age 29 years through age 24 or 76 years with a history of 3 consecutive normal Pap tests.  HPV screening.** / Every 3 years from ages 20 years through ages 5 to 49 years with a history of 3 consecutive normal Pap tests.  Fecal occult blood test (FOBT) of stool. / Every year beginning at age 21 years and continuing until age 89 years. You may not need to do this test if you get a colonoscopy every 10 years.  Flexible sigmoidoscopy or colonoscopy.** / Every 5 years for a flexible sigmoidoscopy or every 10 years for a colonoscopy beginning at age 9 years and continuing until age 23 years.  Hepatitis C blood test.** / For all people born from [birth year] through 1965 and any individual with known risks for hepatitis C.  Skin self-exam. / Monthly.  Influenza vaccine. / Every year.  Tetanus, diphtheria, and acellular pertussis (Tdap/Td) vaccine.** / Consult your health care provider. Pregnant women should receive 1 dose of Tdap vaccine during each pregnancy. 1 dose of Td every 10 years.  Varicella vaccine.** / Consult your health care provider. Pregnant females who do not have evidence of immunity should receive the first dose after pregnancy.  Zoster vaccine.** / 1 dose for adults aged 20 years or older.  Measles, mumps, rubella (MMR) vaccine.** / You need at least 1 dose of MMR if you were born in 1957 or later. You may also need a second dose. For females of childbearing age, rubella immunity should be determined. If there is no evidence of immunity, females who are not pregnant should be vaccinated. If there is no evidence of immunity, females who are pregnant should delay immunization until after pregnancy.  Pneumococcal 13-valent conjugate (PCV13) vaccine.** / Consult your health care provider.  Pneumococcal polysaccharide (PPSV23) vaccine.** / 1 to 2 doses if you smoke cigarettes or if you have certain conditions.  Meningococcal vaccine.** / Consult your health care  provider.  Hepatitis A vaccine.** / Consult your health care provider.  -**atitis B vaccine.** / Consult your health care  provider.  Haemophilus influenzae type b (Hib) vaccine.** / Consult your health care provider. Ages 29 years and over  Blood pressure check.** / Every year.  Lipid and cholesterol check.** / Every 5 years beginning at age 37 years.  Lung cancer screening. / Every year if you are aged 36-80 years and have a 30-pack-year history of smoking and currently smoke or have quit within the past 15 years. Yearly screening is stopped once you have quit smoking for at least 15 years or develop a health problem that would prevent you from having lung cancer treatment.  Clinical breast exam.** / Every year after age 50 years.  BRCA-related cancer risk assessment.** / For women who have family members with a BRCA-related cancer (breast, ovarian, tubal, or peritoneal cancers).  Mammogram.** / Every year beginning at age 42 years and continuing for as long as you are in good health. Consult with your health care provider.  Pap test.** / Every 3 years starting at age 79 years through age 71 or 76 years with 3 consecutive normal Pap tests. Testing can be stopped between 65 and 70 years with 3 consecutive normal Pap tests and no abnormal Pap or HPV tests in the past 10 years.  HPV screening.** / Every 3 years from ages 36 years through ages 46 or 43 years with a history of 3 consecutive normal Pap tests. Testing can be stopped between 65 and 70 years with 3 consecutive normal Pap tests and no abnormal Pap or HPV tests in the past 10 years.  Fecal occult blood test (FOBT) of stool. / Every year beginning at age 108 years and continuing until age 19 years. You may not need to do this test if you get a colonoscopy every 10 years.  Flexible sigmoidoscopy or colonoscopy.** / Every 5 years for a flexible sigmoidoscopy or every 10 years for a colonoscopy beginning at age 56 years and continuing  until age 49 years.  Hepatitis C blood test.** / For all people born from [birth year] through 1963 and any individual with known risks for hepatitis C.  -steoporosis screening.** / A one-time screening for women ages 38 years and over and women at risk for fractures or osteoporosis.  Skin self-exam. / Monthly.  Influenza vaccine. / Every year.  Tetanus, diphtheria, and acellular pertussis (Tdap/Td) vaccine.** / 1 dose of Td every 10 years.  Varicella vaccine.** / Consult your health care provider.  Zoster vaccine.** / 1 dose for adults aged 57 years or older.  Pneumococcal 13-valent conjugate (PCV13) vaccine.** / Consult your health care provider.  Pneumococcal polysaccharide (PPSV23) vaccine.** / 1 dose for all adults aged 9 years and older.  Meningococcal vaccine.** / Consult your health care provider.  Hepatitis A vaccine.** / Consult your health care provider.  Hepatitis B vaccine.** / Consult your health care provider.  Haemophilus influenzae type b (Hib) vaccine.** / Consult your health care provider. ** Family history and personal history of risk and conditions may change your health care provider's recommendations.   This information is not intended to replace advice given to you by your health care provider. Make sure you discuss any questions you have with your health care provider.   Document Released: 01/06/2002 Document Revised: 12/01/2014 Document Reviewed: 04/07/2011 Elsevier Interactive Patient Education [employer].

## 2016-01-05 ENCOUNTER — Encounter: Payer: Self-pay | Admitting: Family Medicine

## 2016-01-06 LAB — CBC WITH DIFFERENTIAL/PLATELET
BASOS PCT: 0.4 % (ref 0.0–3.0)
Basophils Absolute: 0 10*3/uL (ref 0.0–0.1)
EOS ABS: 0.1 10*3/uL (ref 0.0–0.7)
EOS PCT: 1.6 % (ref 0.0–5.0)
HCT: 46.8 % — ABNORMAL HIGH (ref 36.0–46.0)
Hemoglobin: 14.5 g/dL (ref 12.0–15.0)
LYMPHS ABS: 2.7 10*3/uL (ref 0.7–4.0)
Lymphocytes Relative: 34.9 % (ref 12.0–46.0)
MCHC: 31 g/dL (ref 30.0–36.0)
MCV: 96.1 fl (ref 78.0–100.0)
MONO ABS: 0.9 10*3/uL (ref 0.1–1.0)
Monocytes Relative: 12 % (ref 3.0–12.0)
NEUTROS ABS: 3.9 10*3/uL (ref 1.4–7.7)
NEUTROS PCT: 51.1 % (ref 43.0–77.0)
PLATELETS: 267 10*3/uL (ref 150.0–400.0)
RBC: 4.87 Mil/uL (ref 3.87–5.11)
RDW: 14 % (ref 11.5–15.5)
WBC: 7.7 10*3/uL (ref 4.0–10.5)

## 2016-01-07 LAB — ANA: ANA: NEGATIVE

## 2016-02-19 DIAGNOSIS — E119 Type 2 diabetes mellitus without complications: Secondary | ICD-10-CM | POA: Diagnosis not present

## 2016-02-19 DIAGNOSIS — I4519 Other right bundle-branch block: Secondary | ICD-10-CM | POA: Diagnosis not present

## 2016-02-19 DIAGNOSIS — I10 Essential (primary) hypertension: Secondary | ICD-10-CM | POA: Diagnosis not present

## 2016-02-19 DIAGNOSIS — R079 Chest pain, unspecified: Secondary | ICD-10-CM | POA: Diagnosis not present

## 2016-02-26 DIAGNOSIS — R079 Chest pain, unspecified: Secondary | ICD-10-CM | POA: Diagnosis not present

## 2016-03-18 DIAGNOSIS — D2371 Other benign neoplasm of skin of right lower limb, including hip: Secondary | ICD-10-CM | POA: Diagnosis not present

## 2016-03-18 DIAGNOSIS — G5761 Lesion of plantar nerve, right lower limb: Secondary | ICD-10-CM | POA: Diagnosis not present

## 2016-03-18 DIAGNOSIS — M216X2 Other acquired deformities of left foot: Secondary | ICD-10-CM | POA: Diagnosis not present

## 2016-03-18 DIAGNOSIS — E119 Type 2 diabetes mellitus without complications: Secondary | ICD-10-CM | POA: Diagnosis not present

## 2016-03-18 DIAGNOSIS — M216X1 Other acquired deformities of right foot: Secondary | ICD-10-CM | POA: Diagnosis not present

## 2016-03-18 DIAGNOSIS — G5762 Lesion of plantar nerve, left lower limb: Secondary | ICD-10-CM | POA: Diagnosis not present

## 2016-03-26 ENCOUNTER — Other Ambulatory Visit: Payer: Self-pay | Admitting: Family Medicine

## 2016-04-01 DIAGNOSIS — D2372 Other benign neoplasm of skin of left lower limb, including hip: Secondary | ICD-10-CM | POA: Diagnosis not present

## 2016-04-01 DIAGNOSIS — M216X1 Other acquired deformities of right foot: Secondary | ICD-10-CM | POA: Diagnosis not present

## 2016-04-01 DIAGNOSIS — D2371 Other benign neoplasm of skin of right lower limb, including hip: Secondary | ICD-10-CM | POA: Diagnosis not present

## 2016-04-01 DIAGNOSIS — E119 Type 2 diabetes mellitus without complications: Secondary | ICD-10-CM | POA: Diagnosis not present

## 2016-04-01 DIAGNOSIS — M216X2 Other acquired deformities of left foot: Secondary | ICD-10-CM | POA: Diagnosis not present

## 2016-04-11 ENCOUNTER — Ambulatory Visit: Payer: Medicare Other | Admitting: Family Medicine

## 2016-06-06 ENCOUNTER — Encounter: Payer: Self-pay | Admitting: Family Medicine

## 2016-06-06 ENCOUNTER — Ambulatory Visit (INDEPENDENT_AMBULATORY_CARE_PROVIDER_SITE_OTHER): Payer: Medicare Other | Admitting: Family Medicine

## 2016-06-06 VITALS — BP 121/73 | HR 64 | Temp 97.8°F | Ht 66.0 in | Wt 148.8 lb

## 2016-06-06 DIAGNOSIS — E1151 Type 2 diabetes mellitus with diabetic peripheral angiopathy without gangrene: Secondary | ICD-10-CM | POA: Diagnosis not present

## 2016-06-06 DIAGNOSIS — I10 Essential (primary) hypertension: Secondary | ICD-10-CM

## 2016-06-06 DIAGNOSIS — F411 Generalized anxiety disorder: Secondary | ICD-10-CM

## 2016-06-06 DIAGNOSIS — M79604 Pain in right leg: Secondary | ICD-10-CM | POA: Insufficient documentation

## 2016-06-06 DIAGNOSIS — E785 Hyperlipidemia, unspecified: Secondary | ICD-10-CM

## 2016-06-06 DIAGNOSIS — R319 Hematuria, unspecified: Secondary | ICD-10-CM

## 2016-06-06 DIAGNOSIS — M5440 Lumbago with sciatica, unspecified side: Secondary | ICD-10-CM | POA: Diagnosis not present

## 2016-06-06 DIAGNOSIS — S50861A Insect bite (nonvenomous) of right forearm, initial encounter: Secondary | ICD-10-CM

## 2016-06-06 DIAGNOSIS — M545 Low back pain, unspecified: Secondary | ICD-10-CM

## 2016-06-06 DIAGNOSIS — F341 Dysthymic disorder: Secondary | ICD-10-CM

## 2016-06-06 LAB — HEMOGLOBIN A1C: HEMOGLOBIN A1C: 5.9 % (ref 4.6–6.5)

## 2016-06-06 LAB — COMPREHENSIVE METABOLIC PANEL
ALBUMIN: 4.7 g/dL (ref 3.5–5.2)
ALT: 14 U/L (ref 0–35)
AST: 17 U/L (ref 0–37)
Alkaline Phosphatase: 53 U/L (ref 39–117)
BUN: 14 mg/dL (ref 6–23)
CALCIUM: 10 mg/dL (ref 8.4–10.5)
CHLORIDE: 103 meq/L (ref 96–112)
CO2: 24 meq/L (ref 19–32)
Creatinine, Ser: 0.69 mg/dL (ref 0.40–1.20)
GFR: 86.91 mL/min (ref >60.00)
Glucose, Bld: 87 mg/dL (ref 70–99)
POTASSIUM: 4.1 meq/L (ref 3.5–5.1)
Sodium: 139 mEq/L (ref 135–145)
Total Bilirubin: 0.4 mg/dL (ref 0.2–1.2)
Total Protein: 7.6 g/dL (ref 6.0–8.3)

## 2016-06-06 LAB — POCT URINALYSIS DIPSTICK
BILIRUBIN UA: NEGATIVE
GLUCOSE UA: NEGATIVE
Ketones, UA: NEGATIVE
LEUKOCYTES UA: NEGATIVE
NITRITE UA: NEGATIVE
PH UA: 6
Protein, UA: NEGATIVE
Spec Grav, UA: 1.02
Urobilinogen, UA: 0.2

## 2016-06-06 LAB — MICROALBUMIN / CREATININE URINE RATIO
CREATININE, U: 51.1 mg/dL
MICROALB/CREAT RATIO: 1.4 mg/g (ref 0.0–30.0)
Microalb, Ur: <0.7 mg/dL (ref 0.0–1.9)

## 2016-06-06 LAB — LIPID PANEL
CHOLESTEROL: 205 mg/dL — AB (ref 0–200)
HDL: 42.7 mg/dL (ref >39.00)
NonHDL: 162.57
TRIGLYCERIDES: 216 mg/dL — AB (ref 0.0–149.0)
Total CHOL/HDL Ratio: 5
VLDL: 43.2 mg/dL — ABNORMAL HIGH (ref 0.0–40.0)

## 2016-06-06 LAB — LDL CHOLESTEROL, DIRECT: LDL DIRECT: 135 mg/dL

## 2016-06-06 MED ORDER — ALPRAZOLAM 0.25 MG PO TABS: ORAL_TABLET | ORAL | Status: DC

## 2016-06-06 MED ORDER — AMOXICILLIN-POT CLAVULANATE 500-125 MG PO TABS: 1.0000 | ORAL_TABLET | Freq: Three times a day (TID) | ORAL | Status: DC

## 2016-06-06 MED ORDER — VALSARTAN 320 MG PO TABS: 320.0000 mg | ORAL_TABLET | Freq: Every day | ORAL | Status: DC

## 2016-06-06 MED ORDER — TRIAMCINOLONE ACETONIDE 0.1 % EX CREA: 1.0000 "application " | TOPICAL_CREAM | Freq: Two times a day (BID) | CUTANEOUS | Status: DC

## 2016-06-06 MED ORDER — SPIRONOLACTONE 25 MG PO TABS: 25.0000 mg | ORAL_TABLET | Freq: Every day | ORAL | Status: DC

## 2016-06-06 MED ORDER — METFORMIN HCL ER 500 MG PO TB24: 500.0000 mg | ORAL_TABLET | Freq: Two times a day (BID) | ORAL | Status: DC

## 2016-06-06 NOTE — Assessment & Plan Note (Signed)
Will get mri back--- pt was requesting referral to Neuro surgery Surgery had been recommended in past Will make further recommendations after MRI

## 2016-06-06 NOTE — Assessment & Plan Note (Signed)
con't meds  Check labs 

## 2016-06-06 NOTE — Assessment & Plan Note (Signed)
Pt unable to tolerated most meds--- She takes 1/2 xanax few times a week only Pt stopped psych because of side effects to all meds-- see all list

## 2016-06-06 NOTE — Progress Notes (Signed)
Pre visit review using our clinic review tool, if applicable. No additional management support is needed unless otherwise documented below in the visit note. 

## 2016-06-06 NOTE — Progress Notes (Signed)
Patient ID: Cynthia Gilmore, female    DOB: June 07, 1934  Age: 82 y.o. MRN: [id number]    Subjective:  Subjective HPI Cynthia Gilmore presents for f/u low back pain -- it started in 1985 when she picked up a garbage can--- she went to [facility] at the time and injections were offered but she did not want them and then surgery but she did not want to do that either.  She states it has been worsening over last several months-- radiates down both legs to ankle--- an ache feeling.  Sometimes her legs feels like they are going to give out on her She would also like f/u on her labs and have them drawn today.    HYPERTENSION  Blood pressure range-not checking  Chest pain- no      Dyspnea- no Lightheadedness- no   Edema- no Other side effects - no   Medication compliance: good Low salt diet- yes   Blood Sugar ranges-90-119  Polyuria- no New Visual problems- no Hypoglycemic symptoms- no Other side effects-no Medication compliance - good Foot exam- today  HYPERLIPIDEMIA  Medication compliance- good RUQ pain- no  Muscle aches- no Other side effects-no  Review of Systems  Constitutional: Negative for diaphoresis, appetite change, fatigue and unexpected weight change.  Eyes: Negative for pain, redness and visual disturbance.  Respiratory: Negative for cough, chest tightness, shortness of breath and wheezing.   Cardiovascular: Negative for chest pain, palpitations and leg swelling.  Endocrine: Negative for cold intolerance, heat intolerance, polydipsia, polyphagia and polyuria.  Genitourinary: Negative for dysuria, frequency and difficulty urinating.  Musculoskeletal: Positive for back pain, gait problem and neck pain. Negative for myalgias, joint swelling and neck stiffness.  Neurological: Positive for weakness. Negative for dizziness, light-headedness, numbness and headaches.    History Past Medical History  Diagnosis Date  . Anxiety and depression     ? bipolar  . GERD  (gastroesophageal reflux disease)   . Hypertension   . TIA (transient ischemic attack) 2000    x 3   . Osteoarthritis (arthritis due to wear and tear of joints)   . IBS (irritable bowel syndrome)   . Segmental colitis ([place])     iscemic colitis 2009  . Allergy   . Cataract     bil cataracts removed  . Hiatal hernia   . Lymphocytic colitis 2012  . Hemorrhoids 2012   . Anxiety   . Depression   . Heart murmur   . Stroke [facility])     states 2 "'mini strokes" 1980  . Diabetes mellitus, type 2 ([place])   . Mumerous medication allergies and sensitivities 07/20/2015    She has past surgical history that includes Bladder repair (1995); Cholecystectomy (2004); Hernia repair (1995); Partial hysterectomy (1992); Ankle surgery (1999); Cataract extraction, bilateral; Colonoscopy w/ biopsies and polypectomy ( 07/29/2011); Esophagogastroduodenoscopy (07/29/2011); Esophageal manometry; and EUS (06/03/2012).   Her family history includes Diabetes in her maternal aunt, maternal uncle, and mother; Heart disease in her father and paternal aunt. There is no history of Colon cancer.She reports that she has quit smoking. Her smoking use included Cigarettes. She has never used smokeless tobacco. She reports that she does not drink alcohol or use illicit drugs.  Current Outpatient Prescriptions on File Prior to Visit  Medication Sig Dispense Refill  . anti-nausea (EMETROL) solution Take 5 mLs by mouth as needed for nausea or vomiting.    . Ascorbic Acid (VITAMIN C) 500 MG CAPS Take by mouth.    Marland Kitchen  Chlorpheniramine-DM (CORICIDIN HBP COUGH/COLD PO) Take by mouth.    . docusate sodium (COLACE) 100 MG capsule Take 100 mg by mouth 2 (two) times daily.    Marland Kitchen glucose blood (ACCU-CHEK SMARTVIEW) test strip 1 each by Other route daily. 100 each 12  . hyoscyamine (ANASPAZ) 0.125 MG TBDP disintergrating tablet Place 1 tablet (0.125 mg total) under the tongue every 4 (four) hours as needed for cramping. 90 tablet 5  . ibuprofen  (ADVIL,MOTRIN) 200 MG tablet Take 200 mg by mouth every 6 (six) hours as needed.    . Misc Natural Products (ESTROVEN ENERGY PO) Take by mouth.    . ranitidine (ZANTAC) 150 MG tablet Take 1 tablet (150 mg total) by mouth daily. 90 tablet 3   No current facility-administered medications on file prior to visit.     Objective:  Objective Physical Exam  Constitutional: She is oriented to person, place, and time. She appears well-developed and well-nourished.  HENT:  Head: Normocephalic and atraumatic.  Eyes: Conjunctivae and EOM are normal.  Neck: Normal range of motion. Neck supple. No JVD present. Carotid bruit is not present. No thyromegaly present.  Cardiovascular: Normal rate, regular rhythm and normal heart sounds.   No murmur heard. Pulmonary/Chest: Effort normal and breath sounds normal. No respiratory distress. She has no wheezes. She has no rales. She exhibits no tenderness.  Musculoskeletal: She exhibits no edema.  Neurological: She is alert and oriented to person, place, and time.  Weakness in both legs L >R    Skin: There is erythema.     Psychiatric: She has a normal mood and affect.  feet-- unable to feel monofilament in R foot , L foot-- good except big toe BP 121/73 mmHg  Pulse 64  Temp(Src) 97.8 F (36.6 C) (Oral)  Ht 5\' 6"  (1.676 m)  Wt 148 lb 12.8 oz (67.495 kg)  BMI 24.03 kg/m2  SpO2 99% Wt Readings from Last 3 Encounters:  06/06/16 148 lb 12.8 oz (67.495 kg)  01/04/16 149 lb 12.8 oz (67.949 kg)  11/27/15 151 lb 6.4 oz (68.675 kg)     Lab Results  Component Value Date   WBC 7.7 01/04/2016   HGB 14.5 01/04/2016   HCT 46.8* 01/04/2016   PLT 267.0 01/04/2016   GLUCOSE 87 06/06/2016   CHOL 205* 06/06/2016   TRIG 216.0* 06/06/2016   HDL 42.70 06/06/2016   LDLDIRECT 135.0 06/06/2016   LDLCALC 133* 01/04/2016   ALT 14 06/06/2016   AST 17 06/06/2016   NA 139 06/06/2016   K 4.1 06/06/2016   CL 103 06/06/2016   CREATININE 0.69 06/06/2016   BUN 14  06/06/2016   CO2 24 06/06/2016   TSH 1.59 12/07/2014   INR 0.9 10/11/2007   HGBA1C 5.9 06/06/2016   MICROALBUR <0.7 06/06/2016    Dg Bone Density  09/10/2015  EXAM: DUAL X-RAY ABSORPTIOMETRY (DXA) FOR BONE MINERAL DENSITY IMPRESSION: Referring Physician:  Dr. Garnet Koyanagi PATIENT: Name: Cynthia Gilmore, Cynthia Gilmore Patient ID: [id number] Birth Date: Oct 04, 1934 Height: 65.0 in. Sex: Female Measured: 09/10/2015 Weight: 151.0 lbs. Indications: Caucasian, Hysterectomy, Previous Tobacco User, Secondary Osteoporosis Fractures: Ankle Treatments: ASSESSMENT: The BMD measured at Femur Total Left is 0.818 g/cm2 with a T-score of -1.5. This patient is considered osteopenic according to [facility]) criteria. Site Region Measured Date Measured Age WHO YA BMD Classification T-score AP Spine L1-L4 09/10/2015 79.6 Normal 0.0 1.185 g/cm2 DualFemur Total Left 09[age]10/2015 79.6 Osteopenia -1.5 0.818 g/cm2 World Health Organization [facility]) criteria for post-menopausal,  Caucasian Women: Normal       T-score at or above -1 SD Osteopenia   T-score between -1 and -2.5 SD Osteoporosis T-score at or below -2.5 SD RECOMMENDATION: [facility] recommends that FDA-approved medical therapies be considered in postmenopausal women and men age 22 or older with a: 1. Hip or vertebral (clinical or morphometric) fracture. 2. T-score of < -2.5 at the spine or hip. 3. Ten-year fracture probability by FRAX of 3% or greater for hip fracture or 20% or greater for major osteoporotic fracture. All treatment decisions require clinical judgment and consideration of individual patient factors, including patient preferences, co-morbidities, previous drug use, risk factors not captured in the FRAX model (e.g. falls, vitamin D deficiency, increased bone turnover, interval significant decline in bone density) and possible under - or over-estimation of fracture risk by FRAX. All patients should ensure an adequate intake of dietary  calcium (1200 mg/d) and vitamin D (800 IU daily) unless contraindicated. FOLLOW-UP: People with diagnosed cases of osteoporosis or at high risk for fracture should have regular bone mineral density tests. For patients eligible for Medicare, routine testing is allowed once every 2 years. The testing frequency can be increased to one year for patients who have rapidly progressing disease, those who are receiving or discontinuing medical therapy to restore bone mass, or have additional risk factors. I have reviewed this report and agree with the above findings. [facility] FRAX* 10-year Probability of Fracture Based on femoral neck BMD: DualFemur - Right Major Osteoporotic Fracture: 13.3% Hip Fracture:                3.1% Population:                  [place] (Caucasian) Risk Factors:  Secondary Osteoporosis ASSESSMENT: The probability of a major osteoporotic fracture is 13.3% within the next ten years. The probability of a hip fracture is 3.1% within the next ten years. *FRAX is a Materials engineer of the [employer] for Metabolic Bone Disease, a [place] (WHO) [facility]. Electronically Signed   By: Rolm Baptise M.D.   On: 09/10/2015 15:55     Assessment & Plan:  Plan I have discontinued Ms. Villacis's citalopram and Fish Oil. I have also changed her metFORMIN. Additionally, I am having her start on amoxicillin-clavulanate and triamcinolone cream. Lastly, I am having her maintain her glucose blood, ranitidine, hyoscyamine, anti-nausea, docusate sodium, ibuprofen, Chlorpheniramine-DM (CORICIDIN HBP COUGH/COLD PO), Vitamin C, Misc Natural Products (ESTROVEN ENERGY PO), ALPRAZolam, valsartan, and spironolactone.  Meds ordered this encounter  Medications  . DISCONTD: ALPRAZolam (XANAX) 0.25 MG tablet    Sig: Take 1/2-1 tablet once daily prn    Dispense:  30 tablet    Refill:  0  . amoxicillin-clavulanate (AUGMENTIN) 500-125 MG tablet    Sig: Take 1  tablet (500 mg total) by mouth 3 (three) times daily.    Dispense:  30 tablet    Refill:  0  . triamcinolone cream (KENALOG) 0.1 %    Sig: Apply 1 application topically 2 (two) times daily.    Dispense:  30 g    Refill:  0  . ALPRAZolam (XANAX) 0.25 MG tablet    Sig: Take 1/2-1 tablet once daily prn    Dispense:  30 tablet    Refill:  0  . valsartan (DIOVAN) 320 MG tablet    Sig: Take 1 tablet (320 mg total) by mouth daily.    Dispense:  90 tablet  Refill:  1  . spironolactone (ALDACTONE) 25 MG tablet    Sig: Take 1 tablet (25 mg total) by mouth daily.    Dispense:  90 tablet    Refill:  1  . metFORMIN (GLUCOPHAGE-XR) 500 MG 24 hr tablet    Sig: Take 1 tablet (500 mg total) by mouth 2 (two) times daily with a meal.    Dispense:  180 tablet    Refill:  1    Problem List Items Addressed This Visit      Unprioritized   DEPRESSION/ANXIETY    Pt unable to tolerated most meds--- She takes 1/2 xanax few times a week only Pt stopped psych because of side effects to all meds-- see all list      Relevant Medications   ALPRAZolam (XANAX) 0.25 MG tablet   Essential hypertension    Stable Cont meds      Relevant Medications   valsartan (DIOVAN) 320 MG tablet   spironolactone (ALDACTONE) 25 MG tablet   Other Relevant Orders   POCT urinalysis dipstick (Completed)   Microalbumin / creatinine urine ratio (Completed)   Lipid panel (Completed)   Hemoglobin A1c (Completed)   Comprehensive metabolic panel (Completed)   Low back pain radiating to both legs    Will get mri back--- pt was requesting referral to Neuro surgery Surgery had been recommended in past Will make further recommendations after MRI       Other Visit Diagnoses    DM (diabetes mellitus) type II controlled peripheral vascular disorder (HCC)    -  Primary    Relevant Medications    valsartan (DIOVAN) 320 MG tablet    spironolactone (ALDACTONE) 25 MG tablet    metFORMIN (GLUCOPHAGE-XR) 500 MG 24 hr tablet     Other Relevant Orders    POCT urinalysis dipstick (Completed)    Microalbumin / creatinine urine ratio (Completed)    Lipid panel (Completed)    Hemoglobin A1c (Completed)    Comprehensive metabolic panel (Completed)    Hyperlipidemia LDL goal <70        Relevant Medications    valsartan (DIOVAN) 320 MG tablet    spironolactone (ALDACTONE) 25 MG tablet    Other Relevant Orders    POCT urinalysis dipstick (Completed)    Microalbumin / creatinine urine ratio (Completed)    Lipid panel (Completed)    Hemoglobin A1c (Completed)    Comprehensive metabolic panel (Completed)    Low back pain with sciatica, sciatica laterality unspecified, unspecified back pain laterality        Relevant Orders    MR Lumbar Spine Wo Contrast    Generalized anxiety disorder        Relevant Medications    ALPRAZolam (XANAX) 0.25 MG tablet    Insect bite (nonvenomous) of right forearm, initial encounter (CODE)        Relevant Medications    amoxicillin-clavulanate (AUGMENTIN) 500-125 MG tablet    triamcinolone cream (KENALOG) 0.1 %    Hematuria        Relevant Orders    Urine Culture       Follow-up: Return in about 6 months (around 12/07/2016), or if symptoms worsen or fail to improve, for annual exam, fasting.  Ann Held, DO

## 2016-06-06 NOTE — Assessment & Plan Note (Signed)
Stable Cont meds 

## 2016-06-06 NOTE — Assessment & Plan Note (Signed)
worseing pain Will get mri Refer to neuro if abnormal-- she is requesting Dr Ronnald Ramp

## 2016-06-06 NOTE — Patient Instructions (Signed)

## 2016-06-07 LAB — URINE CULTURE: Colony Count: 25000

## 2016-06-09 ENCOUNTER — Telehealth: Payer: Self-pay | Admitting: Behavioral Health

## 2016-06-09 DIAGNOSIS — I10 Essential (primary) hypertension: Secondary | ICD-10-CM

## 2016-06-09 DIAGNOSIS — E785 Hyperlipidemia, unspecified: Secondary | ICD-10-CM

## 2016-06-09 DIAGNOSIS — E1151 Type 2 diabetes mellitus with diabetic peripheral angiopathy without gangrene: Secondary | ICD-10-CM

## 2016-06-09 MED ORDER — PRAVASTATIN SODIUM 20 MG PO TABS: 20.0000 mg | ORAL_TABLET | Freq: Every day | ORAL | Status: DC

## 2016-06-09 NOTE — Telephone Encounter (Signed)
-----   Message from Ann Held, DO sent at 06/06/2016  9:40 PM EDT ----- Cholesterol--- LDL goal < 70,  HDL >40,  TG < 150.  Diet and exercise will increase HDL and decrease LDL and TG.  Fish,  Fish Oil, Flaxseed oil will also help increase the HDL and decrease Triglycerides.   Recheck labs in 3 months------try pravastatin 20 mg #30  1 po qhs, 2 refills Lipid, cmp, hgba1c.

## 2016-06-14 ENCOUNTER — Ambulatory Visit (HOSPITAL_BASED_OUTPATIENT_CLINIC_OR_DEPARTMENT_OTHER)
Admission: RE | Admit: 2016-06-14 | Discharge: 2016-06-14 | Disposition: A | Discharge status: Home / Self Care | Payer: Medicare Other | Source: Ambulatory Visit | Attending: Family Medicine | Admitting: Family Medicine

## 2016-06-14 DIAGNOSIS — M5440 Lumbago with sciatica, unspecified side: Secondary | ICD-10-CM | POA: Insufficient documentation

## 2016-06-14 DIAGNOSIS — M5127 Other intervertebral disc displacement, lumbosacral region: Secondary | ICD-10-CM | POA: Diagnosis not present

## 2016-06-14 DIAGNOSIS — M47892 Other spondylosis, cervical region: Secondary | ICD-10-CM | POA: Diagnosis not present

## 2016-06-14 DIAGNOSIS — M4806 Spinal stenosis, lumbar region: Secondary | ICD-10-CM | POA: Diagnosis not present

## 2016-06-14 DIAGNOSIS — M419 Scoliosis, unspecified: Secondary | ICD-10-CM | POA: Insufficient documentation

## 2016-06-17 ENCOUNTER — Other Ambulatory Visit: Payer: Self-pay

## 2016-06-17 DIAGNOSIS — M48061 Spinal stenosis, lumbar region without neurogenic claudication: Secondary | ICD-10-CM

## 2016-06-17 DIAGNOSIS — M5126 Other intervertebral disc displacement, lumbar region: Secondary | ICD-10-CM

## 2016-07-03 ENCOUNTER — Ambulatory Visit: Payer: Medicare Other | Admitting: Family Medicine

## 2016-07-03 DIAGNOSIS — D2372 Other benign neoplasm of skin of left lower limb, including hip: Secondary | ICD-10-CM | POA: Diagnosis not present

## 2016-07-03 DIAGNOSIS — D2371 Other benign neoplasm of skin of right lower limb, including hip: Secondary | ICD-10-CM | POA: Diagnosis not present

## 2016-07-03 DIAGNOSIS — M216X1 Other acquired deformities of right foot: Secondary | ICD-10-CM | POA: Diagnosis not present

## 2016-07-10 ENCOUNTER — Other Ambulatory Visit: Payer: Self-pay | Admitting: Internal Medicine

## 2016-07-14 DIAGNOSIS — G5603 Carpal tunnel syndrome, bilateral upper limbs: Secondary | ICD-10-CM | POA: Diagnosis not present

## 2016-07-14 DIAGNOSIS — M545 Low back pain: Secondary | ICD-10-CM | POA: Diagnosis not present

## 2016-07-24 DIAGNOSIS — G5603 Carpal tunnel syndrome, bilateral upper limbs: Secondary | ICD-10-CM | POA: Diagnosis not present

## 2016-09-01 ENCOUNTER — Encounter: Payer: Self-pay | Admitting: Family Medicine

## 2016-09-01 ENCOUNTER — Ambulatory Visit (INDEPENDENT_AMBULATORY_CARE_PROVIDER_SITE_OTHER): Payer: Medicare Other | Admitting: Family Medicine

## 2016-09-01 VITALS — BP 126/62 | HR 63 | Temp 98.2°F | Ht 66.0 in | Wt 158.4 lb

## 2016-09-01 DIAGNOSIS — R195 Other fecal abnormalities: Secondary | ICD-10-CM

## 2016-09-01 DIAGNOSIS — J029 Acute pharyngitis, unspecified: Secondary | ICD-10-CM | POA: Diagnosis not present

## 2016-09-01 LAB — POCT RAPID STREP A (OFFICE): RAPID STREP A SCREEN: NEGATIVE

## 2016-09-01 MED ORDER — MONTELUKAST SODIUM 10 MG PO TABS: 10.0000 mg | ORAL_TABLET | Freq: Every day | ORAL | 5 refills | Status: DC

## 2016-09-01 NOTE — Progress Notes (Signed)
Pre visit review using our clinic review tool, if applicable. No additional management support is needed unless otherwise documented below in the visit note. 

## 2016-09-01 NOTE — Progress Notes (Signed)
[place] [facility] [address], [place], [place] [address] [phone]  Date:  09/01/2016   Name:  Cynthia Gilmore   DOB:  1934/03/27   MRN:  [id number]  PCP:  Ann Held, DO    Chief Complaint: Headache (c/o having headaches more frequently, nasal congestion, sore throat that started this morning and also sneezing x 1 month. )   History of Present Illness:  Herbert Kleve is a 82 y.o. very pleasant female patient who presents with the following:  History of HTN, DM.  Had flu shot already this year.  She is here today with illness.  This am she noted a ST and headache. She took advil and the HA is now better She did not notice a fever or chills The ST started last night, worse this morning No sick contacts as far as she is aware She has noted cough and sneeze for about one month- she does have allergies and is taking an OTC medication for this - coricidin for HTN.  She cannot tolerate antihistamines as they make her feel too tired  The cough is dry- non productive She did have diarrhea over the weekend but this is not a new issue- she has IBS and colitis She takes "soemthing to make me go and something to stop me."  No vomiting She brings in a stool sample for me to look at today - she passed it yesterday and was concerned that it looked strange.   Patient Active Problem List   Diagnosis Date Noted  . Low back pain radiating to both legs 06/06/2016  . Sinusitis, acute 11/27/2015  . Numerous medication allergies and sensitivities 07/20/2015  . Carpal tunnel syndrome 05/30/2015  . Dysuria 05/30/2015  . Preventative health care 10/13/2014  . Paroxysmal supraventricular tachycardia ([place]) 11/03/2013  . Palpitations 07/07/2012  . Lipoma stomach 06/03/2012  . Intermittent vertigo 10/08/2011  . GERD (gastroesophageal reflux disease) 06/20/2011  . IBS (irritable bowel syndrome) 06/20/2011  . DEPRESSION/ANXIETY  11/11/2010  . Type II diabetes mellitus with neurological manifestations ([place]) 01/31/2010  . ALLERGIC RHINITIS 09/13/2009  . INSOMNIA, CHRONIC 05/24/2009  . MEMORY LOSS 05/24/2009  . ABDOMINAL PAIN-EPIGASTRIC 01/09/2009  . Lymphocytic colitis 07/19/2008  . PERSONAL HX COLONIC POLYPS 07/19/2008  . Essential hypertension 01/30/2007  . DEGENERATIVE DISC DISEASE 01/30/2007  . CARDIAC MURMUR, AORTIC 01/30/2007    Past Medical History:  Diagnosis Date  . Allergy   . Anxiety   . Anxiety and depression    ? bipolar  . Cataract    bil cataracts removed  . Depression   . Diabetes mellitus, type 2 ([place])   . GERD (gastroesophageal reflux disease)   . Heart murmur   . Hemorrhoids 2012   . Hiatal hernia   . Hypertension   . IBS (irritable bowel syndrome)   . Lymphocytic colitis 2012  . Mumerous medication allergies and sensitivities 07/20/2015  . Osteoarthritis (arthritis due to wear and tear of joints)   . Segmental colitis ([place])    iscemic colitis 2009  . Stroke [facility])    states 2 "'mini strokes" 1980  . TIA (transient ischemic attack) 2000   x 3     Past Surgical History:  Procedure Laterality Date  . ANKLE SURGERY  1999   right ankle fracture surgery  . BLADDER REPAIR  1995  . CATARACT EXTRACTION, BILATERAL    . CHOLECYSTECTOMY  2004  .  COLONOSCOPY W/ BIOPSIES AND POLYPECTOMY   07/29/2011   internal hemorrhoids, lymphocytic colitis  . ESOPHAGEAL MANOMETRY     Normal  . ESOPHAGOGASTRODUODENOSCOPY  07/29/2011   gastritis,hiatal hernia, tortuous esophagus  . EUS  06/03/2012   Procedure: UPPER ENDOSCOPIC ULTRASOUND (EUS) LINEAR;  Surgeon: Milus Banister, MD;  Location: [facility] ENDOSCOPY;  Service: Endoscopy;  Laterality: N/A;  . HERNIA REPAIR  1995  . PARTIAL HYSTERECTOMY  1992   partial    Social History  Substance Use Topics  . Smoking status: Former Smoker    Types: Cigarettes  . Smokeless tobacco: Never Used     Comment: stopped in 1962  . Alcohol use No    Family  History  Problem Relation Age of Onset  . Diabetes Mother   . Diabetes Maternal Aunt   . Heart disease Father   . Diabetes Maternal Uncle   . Heart disease Paternal Aunt   . Colon cancer Neg Hx     Allergies  Allergen Reactions  . Alprazolam     REACTION: sleepy  . Amlodipine Besylate     REACTION: swelling  . Aripiprazole   . Azithromycin   . Bupropion Hcl     REACTION: insomnia  . Cefaclor     REACTION: mouth blisters  . Celecoxib     REACTION: "drained"  . Cephalexin   . Ciprofloxacin   . Clonidine Hydrochloride     REACTION: headache, dizziness  . Desvenlafaxine   . Diphenoxylate-Atropine   . Divalproex Sodium     REACTION: nausea,tremors,fatigue  . Doxycycline   . Entex     REACTION: nausea, headache  . Est Estrogens-Methyltest     REACTION: headache  . Fluoxetine Hcl   . Furosemide   . Hydrochlorothiazide   . Hydrocodone-Acetaminophen     REACTION: headache  . Lamotrigine     REACTION: mood swing,chills, nausea  . Lansoprazole   . Lisinopril     REACTION: cough  . Lorazepam   . Meloxicam     REACTION: nausea, diarrhea  . Methylphenidate Hcl   . Metronidazole   . Mirtazapine     REACTION: swelling  . Nabumetone     REACTION: nausea, headache  . Nitrofuran Derivatives Nausea Only  . Olmesartan Medoxomil   . Other     Alum and Mag Hydroxide Simeth(Solution) GI Cocktail. Diarrhea   . Oxycodone-Acetaminophen   . Pantoprazole Sodium   . Paroxetine     REACTION: tremor, insomnia, nausea  . Quetiapine   . Rabeprazole Sodium     REACTION: diarrhea  . Rofecoxib   . Sertraline Hcl     REACTION: exreme confusion  . Sumatriptan     REACTION: cardiologist says "no"  . Telithromycin     REACTION: "drained"  . Topamax     Confusion and mood swings  . Tramadol-Acetaminophen   . Venlafaxine     REACTION: hypertension, nausea, headache  . Verapamil     Headaches and hot flashes   . Vilazodone Hcl [Vilazodone Hcl]     Medication list has been  reviewed and updated.  Current Outpatient Prescriptions on File Prior to Visit  Medication Sig Dispense Refill  . ALPRAZolam (XANAX) 0.25 MG tablet Take 1/2-1 tablet once daily prn 30 tablet 0  . amoxicillin-clavulanate (AUGMENTIN) 500-125 MG tablet Take 1 tablet (500 mg total) by mouth 3 (three) times daily. 30 tablet 0  . anti-nausea (EMETROL) solution Take 5 mLs by mouth as needed for nausea or vomiting.    Marland Kitchen  Ascorbic Acid (VITAMIN C) 500 MG CAPS Take by mouth.    . Chlorpheniramine-DM (CORICIDIN HBP COUGH/COLD PO) Take by mouth.    . docusate sodium (COLACE) 100 MG capsule Take 100 mg by mouth 2 (two) times daily.    Marland Kitchen glucose blood (ACCU-CHEK SMARTVIEW) test strip 1 each by Other route daily. 100 each 12  . hyoscyamine (ANASPAZ) 0.125 MG TBDP disintergrating tablet Place 1 tablet (0.125 mg total) under the tongue every 4 (four) hours as needed for cramping. 90 tablet 5  . ibuprofen (ADVIL,MOTRIN) 200 MG tablet Take 200 mg by mouth every 6 (six) hours as needed.    . metFORMIN (GLUCOPHAGE-XR) 500 MG 24 hr tablet Take 1 tablet (500 mg total) by mouth 2 (two) times daily with a meal. 180 tablet 1  . Misc Natural Products (ESTROVEN ENERGY PO) Take by mouth.    . ranitidine (ZANTAC) 150 MG tablet TAKE 1 TABLET(150 MG) BY MOUTH DAILY 90 tablet 0  . spironolactone (ALDACTONE) 25 MG tablet Take 1 tablet (25 mg total) by mouth daily. 90 tablet 1  . valsartan (DIOVAN) 320 MG tablet Take 1 tablet (320 mg total) by mouth daily. 90 tablet 1   No current facility-administered medications on file prior to visit.     Review of Systems:  As per HPI- otherwise negative.   Physical Examination: Blood pressure 126/62, pulse 63, temperature 98.2 F (36.8 C), temperature source Oral, height 5\' 6"  (1.676 m), weight 158 lb 6.4 oz (71.8 kg), SpO2 96 %.   GEN: WDWN, NAD, Non-toxic, A & O x 3, looks well HEENT: Atraumatic, Normocephalic. Neck supple. No masses, No LAD.  Bilateral TM wnl, oropharynx  normal.  PEERL,EOMI.   Ears and Nose: No external deformity. CV: RRR, No M/G/R. No JVD. No thrill. No extra heart sounds. PULM: CTA B, no wheezes, crackles, rhonchi. No retractions. No resp. distress. No accessory muscle use. ABD: S, NT, ND, +BS. No rebound. No HSM. EXTR: No c/c/e NEURO Normal gait.  PSYCH: Normally interactive. Conversant. Not depressed or anxious appearing.  Calm demeanor.   She brings in a stool sample in an empty pill bottle- it demonstrates normal appearing stool.  Reassured her that I do not see anything of concern  Results for orders placed or performed in visit on 09/01/16  POCT rapid strep A  Result Value Ref Range   Rapid Strep A Screen Negative Negative     Assessment and Plan: Sore throat - Plan: POCT rapid strep A  Nonspecific abnormal finding in stool contents  Here today with a sore throat and sneezing/ nasal drainage.  She has over 40 medication "allergies" listed.  Will try singulair for her sx- hope that she will tolerate this well.  Reassured that her stool sample is not concerning to me at this time but if she has any other concerns please let me know    Signed Lamar Blinks, MD

## 2016-09-01 NOTE — Patient Instructions (Signed)
Take the singulair once a day as needed for sneezing, sore throat and drainage.  Let me know if you are not feeling better in the next few days- Sooner if worse.

## 2016-09-09 ENCOUNTER — Other Ambulatory Visit: Payer: Medicare Other

## 2016-09-22 ENCOUNTER — Telehealth: Payer: Self-pay | Admitting: Internal Medicine

## 2016-09-22 NOTE — Telephone Encounter (Signed)
Okay with me 

## 2016-09-22 NOTE — Telephone Encounter (Signed)
OK with me.

## 2016-09-22 NOTE — Telephone Encounter (Signed)
Dr. Henrene Pastor ok with you?

## 2016-09-24 ENCOUNTER — Other Ambulatory Visit: Payer: Self-pay | Admitting: Internal Medicine

## 2016-09-26 ENCOUNTER — Other Ambulatory Visit: Payer: Self-pay | Admitting: Family Medicine

## 2016-09-26 ENCOUNTER — Other Ambulatory Visit: Payer: Self-pay | Admitting: Internal Medicine

## 2016-09-26 DIAGNOSIS — I10 Essential (primary) hypertension: Secondary | ICD-10-CM

## 2016-09-26 NOTE — Telephone Encounter (Signed)
Pt scheduled for follow up on 12/11/16. Per last visit 7/14/17patient is to continue Spironolactone. I filled #90 tablets 0 refills. TL/CMA

## 2016-09-29 NOTE — Telephone Encounter (Signed)
Patient is scheduled for a follow up 11/18/16 I havegiven them #90 tablets with no refills. TL/CMA

## 2016-11-18 ENCOUNTER — Encounter: Payer: Self-pay | Admitting: Internal Medicine

## 2016-11-18 ENCOUNTER — Ambulatory Visit (INDEPENDENT_AMBULATORY_CARE_PROVIDER_SITE_OTHER): Payer: Medicare Other | Admitting: Internal Medicine

## 2016-11-18 ENCOUNTER — Encounter (INDEPENDENT_AMBULATORY_CARE_PROVIDER_SITE_OTHER): Payer: Self-pay

## 2016-11-18 VITALS — BP 128/68 | HR 68 | Ht 66.0 in | Wt 161.4 lb

## 2016-11-18 DIAGNOSIS — R1013 Epigastric pain: Secondary | ICD-10-CM | POA: Diagnosis not present

## 2016-11-18 DIAGNOSIS — R1310 Dysphagia, unspecified: Secondary | ICD-10-CM

## 2016-11-18 DIAGNOSIS — K580 Irritable bowel syndrome with diarrhea: Secondary | ICD-10-CM | POA: Diagnosis not present

## 2016-11-18 MED ORDER — HYOSCYAMINE SULFATE 0.125 MG PO TBDP: 0.1250 mg | ORAL_TABLET | ORAL | 5 refills | Status: DC | PRN

## 2016-11-18 NOTE — Patient Instructions (Addendum)
  We have sent the following medications to your pharmacy for you to pick up at your convenience: Generic Levsin  Follow up with [place] as needed.  I appreciate the opportunity to care for you. Silvano Rusk, MD, [facility]

## 2016-11-18 NOTE — Progress Notes (Signed)
   Cynthia Gilmore 82 y.o. 1933-12-21 [id number]  Assessment & Plan:   1. Dyspepsia   2. Odynophagia   3. Irritable bowel syndrome with diarrhea    She is having chronic problems that are not as bad as in the past but the upper abdominal symptoms seem a bit worse lately. Hyoscyamine 0.125 mg when necessary may take before meals. She is elderly but has taken this in the past and is tolerated that there was not really using it much right now. ? FD Donald Prose if that not helping Reassured today. Follow-up as needed Should be a letter refill her medications as needed I told her to have the pharmacy call when she needed an ondansetron refill  I appreciate the opportunity to care for this patient. CC: Cynthia Held, DO   Subjective:   Chief Complaint: Epigastric pain and nausea  HPI Cynthia Gilmore is here with her husband today, he participates in the history some, she's been having an increase in her chronic postprandial epigastric pain lately. She also strangles or what I think is painful swallowing at times or even has some strangling type of chest pains often normal it she's had in the past with just swallowing saliva. These are symptoms similar to what she's had in the past. She has multiple drug intolerances including PPIs. There are 46 allergies listed in her chart. She is on Zantac and a.m. in the evening. She says she worries a lot about her husband after his bypass surgery, he has painful lower extremities at times and has never been 100% the same after his bypass surgery, some lightheadedness or dizziness also.  Ondansetron taken as needed tends to continue to help her prevent postprandial diarrhea in general she takes that when she has to go to an appointment or out somewhere. She still has problems with urgent diarrhea. If she takes the ondansetron several days in a row at 14 mg dose in the morning she'll get constipated. When she is at home she can prevent problems by using only a  half a tablet i.e. 2 mg.  Wt Readings from Last 3 Encounters:  11/18/16 161 lb 6.4 oz (73.2 kg)  09/01/16 158 lb 6.4 oz (71.8 kg)  06/06/16 148 lb 12.8 oz (67.5 kg)    Medications, allergies, past medical history, past surgical history, family history and social history are reviewed and updated in the EMR.   Review of Systems  As above, Also considering magnetic question electro magnetic therapy for her depression  Objective:   Physical Exam BP 128/68 (BP Location: Right Arm, Patient Position: Sitting, Cuff Size: Normal)   Pulse 68   Ht 5\' 6"  (1.676 m)   Wt 161 lb 6.4 oz (73.2 kg)   BMI 26.05 kg/m  No acute distress  15 minutes time spent with patient > half in counseling coordination of care

## 2016-12-02 ENCOUNTER — Telehealth: Payer: Self-pay | Admitting: Family Medicine

## 2016-12-02 NOTE — Telephone Encounter (Signed)
lvm advising patient to schedule medicare wellness appointment.  °

## 2016-12-03 NOTE — Telephone Encounter (Signed)
Patient will schedule medicare wellness appointment after her appointment with provider on 12/11/16.

## 2016-12-05 ENCOUNTER — Ambulatory Visit (HOSPITAL_BASED_OUTPATIENT_CLINIC_OR_DEPARTMENT_OTHER)
Admission: RE | Admit: 2016-12-05 | Discharge: 2016-12-05 | Disposition: A | Discharge status: Home / Self Care | Payer: Medicare Other | Source: Ambulatory Visit | Attending: Family Medicine | Admitting: Family Medicine

## 2016-12-05 ENCOUNTER — Ambulatory Visit (INDEPENDENT_AMBULATORY_CARE_PROVIDER_SITE_OTHER): Payer: Medicare Other | Admitting: Family Medicine

## 2016-12-05 ENCOUNTER — Encounter: Payer: Self-pay | Admitting: Family Medicine

## 2016-12-05 VITALS — BP 118/66 | HR 98 | Temp 100.1°F | Resp 16 | Ht 66.0 in | Wt 158.6 lb

## 2016-12-05 DIAGNOSIS — J40 Bronchitis, not specified as acute or chronic: Secondary | ICD-10-CM | POA: Insufficient documentation

## 2016-12-05 DIAGNOSIS — R918 Other nonspecific abnormal finding of lung field: Secondary | ICD-10-CM | POA: Insufficient documentation

## 2016-12-05 DIAGNOSIS — R05 Cough: Secondary | ICD-10-CM | POA: Diagnosis not present

## 2016-12-05 DIAGNOSIS — J0140 Acute pansinusitis, unspecified: Secondary | ICD-10-CM

## 2016-12-05 LAB — CBC WITH DIFFERENTIAL/PLATELET
BASOS PCT: 0 %
Basophils Absolute: 0 cells/uL (ref 0–200)
EOS PCT: 2 %
Eosinophils Absolute: 156 cells/uL (ref 15–500)
HCT: 40.2 % (ref 35.0–45.0)
Hemoglobin: 13.4 g/dL (ref 11.7–15.5)
LYMPHS PCT: 24 %
Lymphs Abs: 1872 cells/uL (ref 850–3900)
MCH: 29.8 pg (ref 27.0–33.0)
MCHC: 33.3 g/dL (ref 32.0–36.0)
MCV: 89.5 fL (ref 80.0–100.0)
MONOS PCT: 19 %
MPV: 10.4 fL (ref 7.5–12.5)
Monocytes Absolute: 1482 cells/uL — ABNORMAL HIGH (ref 200–950)
NEUTROS PCT: 55 %
Neutro Abs: 4290 cells/uL (ref 1500–7800)
PLATELETS: 225 10*3/uL (ref 140–400)
RBC: 4.49 MIL/uL (ref 3.80–5.10)
RDW: 13.2 % (ref 11.0–15.0)
WBC: 7.8 10*3/uL (ref 3.8–10.8)

## 2016-12-05 LAB — COMPREHENSIVE METABOLIC PANEL
ALK PHOS: 56 U/L (ref 33–130)
ALT: 17 U/L (ref 6–29)
AST: 21 U/L (ref 10–35)
Albumin: 4.5 g/dL (ref 3.6–5.1)
BILIRUBIN TOTAL: 0.2 mg/dL (ref 0.2–1.2)
BUN: 14 mg/dL (ref 7–25)
CO2: 26 mmol/L (ref 20–31)
Calcium: 9.7 mg/dL (ref 8.6–10.4)
Chloride: 101 mmol/L (ref 98–110)
Creat: 0.69 mg/dL (ref 0.60–0.88)
GLUCOSE: 106 mg/dL — AB (ref 65–99)
Potassium: 4.6 mmol/L (ref 3.5–5.3)
Sodium: 137 mmol/L (ref 135–146)
Total Protein: 7.1 g/dL (ref 6.1–8.1)

## 2016-12-05 MED ORDER — IPRATROPIUM-ALBUTEROL 0.5-2.5 (3) MG/3ML IN SOLN
3.0000 mL | Freq: Once | RESPIRATORY_TRACT | Status: AC
  Administered 2016-12-05: 3 mL via RESPIRATORY_TRACT

## 2016-12-05 MED ORDER — PROMETHAZINE-DM 6.25-15 MG/5ML PO SYRP: 5.0000 mL | ORAL_SOLUTION | Freq: Four times a day (QID) | ORAL | 0 refills | Status: DC | PRN

## 2016-12-05 MED ORDER — AMOXICILLIN-POT CLAVULANATE 875-125 MG PO TABS: 1.0000 | ORAL_TABLET | Freq: Two times a day (BID) | ORAL | 0 refills | Status: DC

## 2016-12-05 NOTE — Progress Notes (Signed)
Patient ID: Cynthia Gilmore, female    DOB: [birth year]  Age: 83 y.o. MRN: [id number]    Subjective:  Subjective  HPI [name] presents for c/o sinus congestion and headaches over eyes.    Review of Systems  Constitutional: Positive for fatigue. Negative for activity change, appetite change and unexpected weight change.  HENT: Positive for congestion, sinus pain, sinus pressure and sneezing. Negative for ear pain.   Respiratory: Positive for cough and shortness of breath.   Cardiovascular: Negative for chest pain and palpitations.  Psychiatric/Behavioral: Negative for behavioral problems and dysphoric mood. The patient is not nervous/anxious.     History Past Medical History:  Diagnosis Date  . Allergy   . Anxiety   . Anxiety and depression    ? bipolar  . Cataract    bil cataracts removed  . Depression   . Diabetes mellitus, type 2 ([place])   . GERD (gastroesophageal reflux disease)   . Heart murmur   . Hemorrhoids 2012   . Hiatal hernia   . Hypertension   . IBS (irritable bowel syndrome)   . Lymphocytic colitis 2012  . Mumerous medication allergies and sensitivities 07/20/2015  . Osteoarthritis (arthritis due to wear and tear of joints)   . Segmental colitis ([place])    iscemic colitis 2009  . Stroke [facility])    states 2 "'mini strokes" 1980  . TIA (transient ischemic attack) 2000   x 3     She has a past surgical history that includes Bladder repair (1995); Cholecystectomy (2004); Hernia repair (1995); Partial hysterectomy (1992); Ankle surgery (1999); Cataract extraction, bilateral; Colonoscopy w/ biopsies and polypectomy ( 07/29/2011); Esophagogastroduodenoscopy (07/29/2011); Esophageal manometry; and EUS (06/03/2012).   Her family history includes Diabetes in her maternal aunt, maternal uncle, and mother; Heart disease in her father and paternal aunt.She reports that she has quit smoking. Her smoking use included Cigarettes. She has never used smokeless tobacco. She  reports that she does not drink alcohol or use drugs.  Current Outpatient Prescriptions on File Prior to Visit  Medication Sig Dispense Refill  . ALPRAZolam (XANAX) 0.25 MG tablet Take 1/2-1 tablet once daily prn 30 tablet 0  . anti-nausea (EMETROL) solution Take 5 mLs by mouth as needed for nausea or vomiting.    . Ascorbic Acid (VITAMIN C) 500 MG CAPS Take by mouth.    Marland Kitchen glucose blood (ACCU-CHEK SMARTVIEW) test strip 1 each by Other route daily. 100 each 12  . hyoscyamine (ANASPAZ) 0.125 MG TBDP disintergrating tablet Place 1 tablet (0.125 mg total) under the tongue every 4 (four) hours as needed for cramping. 90 tablet 5  . ibuprofen (ADVIL,MOTRIN) 200 MG tablet Take 200 mg by mouth every 6 (six) hours as needed.    . metFORMIN (GLUCOPHAGE-XR) 500 MG 24 hr tablet Take 1 tablet (500 mg total) by mouth 2 (two) times daily with a meal. 180 tablet 1  . Misc Natural Products (ESTROVEN ENERGY PO) Take by mouth.    . ondansetron (ZOFRAN) 4 MG tablet Take 4 mg by mouth every 8 (eight) hours as needed for nausea or vomiting (Takes as needed.).    Marland Kitchen ranitidine (ZANTAC) 150 MG tablet TAKE 1 TABLET(150 MG) BY MOUTH DAILY 90 tablet 0  . spironolactone (ALDACTONE) 25 MG tablet Take 1 tablet (25 mg total) by mouth daily. 90 tablet 1  . spironolactone (ALDACTONE) 25 MG tablet TAKE 1 TABLET(25 MG) BY MOUTH DAILY 90 tablet 0  . TRINTELLIX 20 MG TABS Take  20 mg by mouth daily.  3  . valsartan (DIOVAN) 320 MG tablet Take 1 tablet (320 mg total) by mouth daily. 90 tablet 1  . montelukast (SINGULAIR) 10 MG tablet Take 1 tablet (10 mg total) by mouth at bedtime. (Patient not taking: Reported on 12/05/2016) 30 tablet 5   No current facility-administered medications on file prior to visit.      Objective:  Objective  Physical Exam  Constitutional: She is oriented to person, place, and time. She appears well-developed and well-nourished.  HENT:  Right Ear: External ear normal.  Left Ear: External ear normal.    + PND + errythema  Eyes: Conjunctivae are normal. Right eye exhibits no discharge. Left eye exhibits no discharge.  Cardiovascular: Normal rate, regular rhythm and normal heart sounds.   No murmur heard. Pulmonary/Chest: Effort normal. No respiratory distress. She has decreased breath sounds in the right lower field and the left lower field. She has no wheezes. She has no rales. She exhibits no tenderness.  Musculoskeletal: She exhibits no edema.  Lymphadenopathy:    She has cervical adenopathy.  Neurological: She is alert and oriented to person, place, and time.  Nursing note and vitals reviewed.  BP 118/66 (BP Location: Left Arm, Cuff Size: Normal)   Pulse 98   Temp 100.1 F (37.8 C) (Oral)   Resp 16   Ht _0  (1.676 m)   Wt 158 lb 9.6 oz (71.9 kg)   SpO2 96%   BMI 25.60 kg/m  Wt Readings from Last 3 Encounters:  12/05/16 158 lb 9.6 oz (71.9 kg)  11/18/16 161 lb 6.4 oz (73.2 kg)  09/01/16 158 lb 6.4 oz (71.8 kg)     Lab Results  Component Value Date   WBC 7.8 12/05/2016   HGB 13.4 12/05/2016   HCT 40.2 12/05/2016   PLT 225 12/05/2016   GLUCOSE 106 (H) 12/05/2016   CHOL 205 (H) 06/06/2016   TRIG 216.0 (H) 06/06/2016   HDL 42.70 06/06/2016   LDLDIRECT 135.0 06/06/2016   LDLCALC 133 (H) 01/04/2016   ALT 17 12/05/2016   AST 21 12/05/2016   NA 137 12/05/2016   K 4.6 12/05/2016   CL 101 12/05/2016   CREATININE 0.69 12/05/2016   BUN 14 12/05/2016   CO2 26 12/05/2016   TSH 1.59 12/07/2014   INR 0.9 10/11/2007   HGBA1C 5.9 06/06/2016   MICROALBUR <0.7 06/06/2016    Mr Lumbar Spine Wo Contrast  Result Date: 06/14/2016 CLINICAL DATA:  Chronic low back pain with increasing severity and frequency. Low back pain extending into both hips and lower extremities. EXAM: MRI LUMBAR SPINE WITHOUT CONTRAST TECHNIQUE: Multiplanar, multisequence MR imaging of the lumbar spine was performed. No intravenous contrast was administered. COMPARISON:  CT abdomen and pelvis 06/17/2011  FINDINGS: Segmentation: 5 non rib-bearing lumbar type vertebral bodies are present. Alignment: There is slight retrolisthesis at L1-2. AP alignment is otherwise anatomic. Rightward curvature of the lumbar spine is centered at L2. There is leftward curvature centered at L4. Vertebrae:  Marrow signal and vertebral body heights are normal. Conus medullaris: Extends to the T12-L1 level and appears normal. Paraspinal and other soft tissues: Limited imaging of the abdomen is unremarkable. There is no significant adenopathy. Disc levels: L1-2: A leftward broad-based disc protrusion is present. Mild facet hypertrophy is noted bilaterally. This leads to mild left subarticular and left greater than right foraminal stenosis. L2-3: A leftward disc protrusion is present. Mild facet hypertrophy is noted bilaterally. Mild subarticular narrowing is worse  on the left. L3-4: A broad-based disc protrusion is present. Moderate facet hypertrophy is worse on the right. Moderate right and mild left subarticular stenosis is present. Facet spurring contributes to moderate right and mild left foraminal stenosis as well. L4-5: A broad-based disc protrusion is present. Moderate facet hypertrophy is noted. Moderate subarticular narrowing is present bilaterally. Moderate foraminal stenosis is present bilaterally, left greater than right L5-S1: A shallow central disc protrusion is present. Moderate facet hypertrophy is noted bilaterally. There is no significant stenosis. IMPRESSION: 1. Multilevel spondylosis of the cervical spine as described above. A shaped scoliosis is centered to the right at L2 and to the left at L4. 2. Mild left subarticular and left greater than right foraminal stenosis at L1-2. 3. Mild subarticular narrowing at L2-3 is worse on the left. 4. Moderate right and mild left subarticular and foraminal stenosis at L3-4. 5. Moderate subarticular and bilateral foraminal stenosis at L4-5 is worse on the left. 6. Fell central disc  protrusion and moderate facet hypertrophy at L5-S1 without significant stenosis. Electronically Signed   By: San Morelle M.D.   On: 06/14/2016 20:02     Assessment & Plan:  Plan  I have discontinued Ms. Kruck's Chlorpheniramine-DM (CORICIDIN HBP COUGH/COLD PO). I am also having her start on amoxicillin-clavulanate and promethazine-dextromethorphan. Additionally, I am having her maintain her glucose blood, anti-nausea, ibuprofen, Vitamin C, Misc Natural Products (ESTROVEN ENERGY PO), ALPRAZolam, valsartan, spironolactone, metFORMIN, montelukast, spironolactone, ranitidine, ondansetron, TRINTELLIX, and hyoscyamine. We administered ipratropium-albuterol.  Meds ordered this encounter  Medications  . amoxicillin-clavulanate (AUGMENTIN) 875-125 MG tablet    Sig: Take 1 tablet by mouth 2 (two) times daily.    Dispense:  20 tablet    Refill:  0  . promethazine-dextromethorphan (PROMETHAZINE-DM) 6.25-15 MG/5ML syrup    Sig: Take 5 mLs by mouth 4 (four) times daily as needed.    Dispense:  118 mL    Refill:  0  . ipratropium-albuterol (DUONEB) 0.5-2.5 (3) MG/3ML nebulizer solution 3 mL    Problem List Items Addressed This Visit      Unprioritized   Sinusitis, acute    abx per orders flonase  rto prn      Relevant Medications   amoxicillin-clavulanate (AUGMENTIN) 875-125 MG tablet   promethazine-dextromethorphan (PROMETHAZINE-DM) 6.25-15 MG/5ML syrup    Other Visit Diagnoses    Bronchitis    -  Primary   Relevant Medications   amoxicillin-clavulanate (AUGMENTIN) 875-125 MG tablet   promethazine-dextromethorphan (PROMETHAZINE-DM) 6.25-15 MG/5ML syrup   ipratropium-albuterol (DUONEB) 0.5-2.5 (3) MG/3ML nebulizer solution 3 mL (Completed)   Other Relevant Orders   DG Chest 2 View (Completed)   CBC w/Diff (Completed)   Comp Met (CMET) (Completed)      Follow-up: Return if symptoms worsen or fail to improve, for as scheduled.  Ann Held, DO

## 2016-12-05 NOTE — Progress Notes (Signed)
Pre visit review using our clinic review tool, if applicable. No additional management support is needed unless otherwise documented below in the visit note. 

## 2016-12-05 NOTE — Patient Instructions (Signed)

## 2016-12-07 NOTE — Assessment & Plan Note (Signed)
abx per orders flonase  rto prn  

## 2016-12-08 ENCOUNTER — Other Ambulatory Visit: Payer: Self-pay

## 2016-12-08 NOTE — Progress Notes (Unsigned)
Is this the correct medication  Please advise

## 2016-12-08 NOTE — Progress Notes (Signed)
yes

## 2016-12-09 MED ORDER — HYDROCOD POLST-CPM POLST ER 10-8 MG/5ML PO SUER
5.0000 mL | Freq: Every evening | ORAL | 0 refills | Status: DC | PRN
Start: 2016-12-09 — End: 2018-04-23

## 2016-12-09 NOTE — Progress Notes (Signed)
Patient will be in this Thursday to pick up rx  PC

## 2016-12-11 ENCOUNTER — Ambulatory Visit: Payer: Medicare Other | Admitting: Family Medicine

## 2016-12-12 ENCOUNTER — Telehealth: Payer: Self-pay | Admitting: Family Medicine

## 2016-12-12 NOTE — Telephone Encounter (Signed)
Yes confirmed if was OTC coricidin.  Patient informed of PCP instructions.

## 2016-12-12 NOTE — Telephone Encounter (Signed)
Pt says that she was seen and prescribed a antibiotic. She would like to know if she can take Corodium with antibiotic that was given?    Please advise.

## 2016-12-12 NOTE — Telephone Encounter (Signed)
Coricidin ?  yes

## 2016-12-12 NOTE — Telephone Encounter (Signed)
I spelled based on pt's spelling, I assume so Dr. Etter Sjogren

## 2016-12-19 ENCOUNTER — Ambulatory Visit (INDEPENDENT_AMBULATORY_CARE_PROVIDER_SITE_OTHER): Payer: Medicare Other | Admitting: Family Medicine

## 2016-12-19 ENCOUNTER — Encounter: Payer: Self-pay | Admitting: Family Medicine

## 2016-12-19 VITALS — BP 118/58 | HR 72 | Temp 98.5°F | Resp 16 | Ht 66.0 in | Wt 158.8 lb

## 2016-12-19 DIAGNOSIS — E1151 Type 2 diabetes mellitus with diabetic peripheral angiopathy without gangrene: Secondary | ICD-10-CM

## 2016-12-19 DIAGNOSIS — E785 Hyperlipidemia, unspecified: Secondary | ICD-10-CM

## 2016-12-19 DIAGNOSIS — E11610 Type 2 diabetes mellitus with diabetic neuropathic arthropathy: Secondary | ICD-10-CM

## 2016-12-19 DIAGNOSIS — E538 Deficiency of other specified B group vitamins: Secondary | ICD-10-CM | POA: Diagnosis not present

## 2016-12-19 DIAGNOSIS — I10 Essential (primary) hypertension: Secondary | ICD-10-CM | POA: Diagnosis not present

## 2016-12-19 LAB — VITAMIN B12: VITAMIN B 12: 364 pg/mL (ref 200–1100)

## 2016-12-19 MED ORDER — SPIRONOLACTONE 25 MG PO TABS: ORAL_TABLET | ORAL | 0 refills | Status: DC

## 2016-12-19 MED ORDER — RANITIDINE HCL 150 MG PO TABS: ORAL_TABLET | ORAL | 0 refills | Status: DC

## 2016-12-19 MED ORDER — METFORMIN HCL ER 500 MG PO TB24: 500.0000 mg | ORAL_TABLET | Freq: Two times a day (BID) | ORAL | 1 refills | Status: DC

## 2016-12-19 MED ORDER — VALSARTAN 320 MG PO TABS: 320.0000 mg | ORAL_TABLET | Freq: Every day | ORAL | 1 refills | Status: DC

## 2016-12-19 NOTE — Patient Instructions (Signed)
Carbohydrate Counting for Diabetes Mellitus, Adult Carbohydrate counting is a method for keeping track of how many carbohydrates you eat. Eating carbohydrates naturally increases the amount of sugar (glucose) in the blood. Counting how many carbohydrates you eat helps keep your blood glucose within normal limits, which helps you manage your diabetes (diabetes mellitus). It is important to know how many carbohydrates you can safely have in each meal. This is different for every person. A diet and nutrition specialist (registered dietitian) can help you make a meal plan and calculate how many carbohydrates you should have at each meal and snack. Carbohydrates are found in the following foods:  Grains, such as breads and cereals.  Dried beans and soy products.  Starchy vegetables, such as potatoes, peas, and corn.  Fruit and fruit juices.  Milk and yogurt.  Sweets and snack foods, such as cake, cookies, candy, chips, and soft drinks. How do I count carbohydrates? There are two ways to count carbohydrates in food. You can use either of the methods or a combination of both. Reading "Nutrition Facts" on packaged food  The "Nutrition Facts" list is included on the labels of almost all packaged foods and beverages in the [place] It includes:  The serving size.  Information about nutrients in each serving, including the grams (g) of carbohydrate per serving. To use the "Nutrition Facts":  Decide how many servings you will have.  Multiply the number of servings by the number of carbohydrates per serving.  The resulting number is the total amount of carbohydrates that you will be having. Learning standard serving sizes of other foods  When you eat foods containing carbohydrates that are not packaged or do not include "Nutrition Facts" on the label, you need to measure the servings in order to count the amount of carbohydrates:  Measure the foods that you will eat with a food scale or measuring  cup, if needed.  Decide how many standard-size servings you will eat.  Multiply the number of servings by 15. Most carbohydrate-rich foods have about 15 g of carbohydrates per serving.  For example, if you eat 8 oz (170 g) of strawberries, you will have eaten 2 servings and 30 g of carbohydrates (2 servings x 15 g = 30 g).  For foods that have more than one food mixed, such as soups and casseroles, you must count the carbohydrates in each food that is included. The following list contains standard serving sizes of common carbohydrate-rich foods. Each of these servings has about 15 g of carbohydrates:   hamburger bun or  English muffin.   oz (15 mL) syrup.   oz (14 g) jelly.  1 slice of bread.  1 six-inch tortilla.  3 oz (85 g) cooked rice or pasta.  4 oz (113 g) cooked dried beans.  4 oz (113 g) starchy vegetable, such as peas, corn, or potatoes.  4 oz (113 g) hot cereal.  4 oz (113 g) mashed potatoes or  of a large baked potato.  4 oz (113 g) canned or frozen fruit.  4 oz (120 mL) fruit juice.  4-6 crackers.  6 chicken nuggets.  6 oz (170 g) unsweetened dry cereal.  6 oz (170 g) plain fat-free yogurt or yogurt sweetened with artificial sweeteners.  8 oz (240 mL) milk.  8 oz (170 g) fresh fruit or one small piece of fruit.  24 oz (680 g) popped popcorn. Example of carbohydrate counting Sample meal  3 oz (85 g) chicken breast.  6 oz (  170 g) brown rice.  4 oz (113 g) corn.  8 oz (240 mL) milk.  8 oz (170 g) strawberries with sugar-free whipped topping. Carbohydrate calculation 1. Identify the foods that contain carbohydrates:  Rice.  Corn.  Milk.  Strawberries. 2. Calculate how many servings you have of each food:  2 servings rice.  1 serving corn.  1 serving milk.  1 serving strawberries. 3. Multiply each number of servings by 15 g:  2 servings rice x 15 g = 30 g.  1 serving corn x 15 g = 15 g.  1 serving milk x 15 g = 15  g.  1 serving strawberries x 15 g = 15 g. 4. Add together all of the amounts to find the total grams of carbohydrates eaten:  30 g + 15 g + 15 g + 15 g = 75 g of carbohydrates total. This information is not intended to replace advice given to you by your health care provider. Make sure you discuss any questions you have with your health care provider. Document Released: 11/10/2005 Document Revised: 05/30/2016 Document Reviewed: 04/23/2016 Elsevier Interactive Patient Education  2017 [employer] Inc[employer]  

## 2016-12-19 NOTE — Progress Notes (Signed)
Subjective:    Patient ID: Cynthia Gilmore, female    DOB: May 08, 1934, 83 y.o.   MRN: [id number]  Chief Complaint  Patient presents with  . Diabetes    follow up  . Hypertension    follow up    Diabetes  Pertinent negatives for hypoglycemia include no headaches. Pertinent negatives for diabetes include no blurred vision and no chest pain.  Hypertension  Pertinent negatives include no blurred vision, chest pain, headaches or palpitations.   Patient is in today for diabetes and hypertension follow up. Pt state she has no concerns.  Past Medical History:  Diagnosis Date  . Allergy   . Anxiety   . Anxiety and depression    ? bipolar  . Cataract    bil cataracts removed  . Depression   . Diabetes mellitus, type 2 ([place])   . GERD (gastroesophageal reflux disease)   . Heart murmur   . Hemorrhoids 2012   . Hiatal hernia   . Hypertension   . IBS (irritable bowel syndrome)   . Lymphocytic colitis 2012  . Mumerous medication allergies and sensitivities 07/20/2015  . Osteoarthritis (arthritis due to wear and tear of joints)   . Segmental colitis ([place])    iscemic colitis 2009  . Stroke [facility])    states 2 "'mini strokes" 1980  . TIA (transient ischemic attack) 2000   x 3     Past Surgical History:  Procedure Laterality Date  . ANKLE SURGERY  1999   right ankle fracture surgery  . BLADDER REPAIR  1995  . CATARACT EXTRACTION, BILATERAL    . CHOLECYSTECTOMY  2004  . COLONOSCOPY W/ BIOPSIES AND POLYPECTOMY   07/29/2011   internal hemorrhoids, lymphocytic colitis  . ESOPHAGEAL MANOMETRY     Normal  . ESOPHAGOGASTRODUODENOSCOPY  07/29/2011   gastritis,hiatal hernia, tortuous esophagus  . EUS  06/03/2012   Procedure: UPPER ENDOSCOPIC ULTRASOUND (EUS) LINEAR;  Surgeon: Milus Banister, MD;  Location: [facility] END[facility]COPY;  Service: Endoscopy;  Laterality: N/A;  . HERNIA REPAIR  1995  . PARTIAL HYSTERECTOMY  1992   partial    Family History  Problem Relation Age of Onset  . Diabetes  Mother   . Heart disease Father   . Diabetes Maternal Aunt   . Diabetes Maternal Uncle   . Heart disease Paternal Aunt   . Colon cancer Neg Hx     Social History   Social History  . Marital status: Married    Spouse name: N/A  . Number of children: 2  . Years of education: N/A   Occupational History  . Retired Retired   Social History Main Topics  . Smoking status: Former Smoker    Types: Cigarettes  . Smokeless tobacco: Never Used     Comment: stopped in 1962  . Alcohol use No  . Drug use: No  . Sexual activity: Not on file   Other Topics Concern  . Not on file   Social History Narrative   Occupation: retired   Patient is a former smoker. -stopped 1962   Alcohol Use - no     Illicit Drug Use - no         Daily Caffeine Use  -1 cup daily           Position roster   Cardiologist-Dr. Tamala Julian   GYN-Dr. Corinna Capra   Ophthalmologist-Dr. Katy Fitch   Gastroenterologist-Dr. Carlean Purl    Outpatient Medications Prior to Visit  Medication Sig Dispense Refill  . ALPRAZolam (  XANAX) 0.25 MG tablet Take 1/2-1 tablet once daily prn 30 tablet 0  . anti-nausea (EMETROL) solution Take 5 mLs by mouth as needed for nausea or vomiting.    . Ascorbic Acid (VITAMIN C) 500 MG CAPS Take by mouth.    . chlorpheniramine-HYDROcodone (TUSSIONEX PENNKINETIC ER) 10-8 MG/5ML SUER Take 5 mLs by mouth at bedtime as needed for cough. 140 mL 0  . glucose blood (ACCU-CHEK SMARTVIEW) test strip 1 each by Other route daily. 100 each 12  . hyoscyamine (ANASPAZ) 0.125 MG TBDP disintergrating tablet Place 1 tablet (0.125 mg total) under the tongue every 4 (four) hours as needed for cramping. 90 tablet 5  . ibuprofen (ADVIL,MOTRIN) 200 MG tablet Take 200 mg by mouth every 6 (six) hours as needed.    . Misc Natural Products (ESTROVEN ENERGY PO) Take by mouth.    . montelukast (SINGULAIR) 10 MG tablet Take 1 tablet (10 mg total) by mouth at bedtime. 30 tablet 5  . ondansetron (ZOFRAN) 4 MG tablet Take 4 mg by mouth  every 8 (eight) hours as needed for nausea or vomiting (Takes as needed.).    Marland Kitchen promethazine-dextromethorphan (PROMETHAZINE-DM) 6.25-15 MG/5ML syrup Take 5 mLs by mouth 4 (four) times daily as needed. 118 mL 0  . spironolactone (ALDACTONE) 25 MG tablet Take 1 tablet (25 mg total) by mouth daily. 90 tablet 1  . TRINTELLIX 20 MG TABS Take 20 mg by mouth daily.  3  . metFORMIN (GLUCOPHAGE-XR) 500 MG 24 hr tablet Take 1 tablet (500 mg total) by mouth 2 (two) times daily with a meal. 180 tablet 1  . ranitidine (ZANTAC) 150 MG tablet TAKE 1 TABLET(150 MG) BY MOUTH DAILY 90 tablet 0  . spironolactone (ALDACTONE) 25 MG tablet TAKE 1 TABLET(25 MG) BY MOUTH DAILY 90 tablet 0  . valsartan (DIOVAN) 320 MG tablet Take 1 tablet (320 mg total) by mouth daily. 90 tablet 1  . amoxicillin-clavulanate (AUGMENTIN) 875-125 MG tablet Take 1 tablet by mouth 2 (two) times daily. (Patient not taking: Reported on 12/19/2016) 20 tablet 0   No facility-administered medications prior to visit.     Allergies  Allergen Reactions  . Alprazolam     REACTION: sleepy  . Amlodipine Besylate     REACTION: swelling  . Aripiprazole   . Augmentin [Amoxicillin-Pot Clavulanate] Diarrhea  . Azithromycin   . Bupropion Hcl     REACTION: insomnia  . Cefaclor     REACTION: mouth blisters  . Celecoxib     REACTION: "drained"  . Cephalexin   . Ciprofloxacin   . Clonidine Hydrochloride     REACTION: headache, dizziness  . Desvenlafaxine   . Diphenoxylate-Atropine   . Divalproex Sodium     REACTION: nausea,tremors,fatigue  . Doxycycline   . Entex     REACTION: nausea, headache  . Est Estrogens-Methyltest     REACTION: headache  . Fluoxetine Hcl   . Furosemide   . Hydrochlorothiazide   . Hydrocodone-Acetaminophen     REACTION: headache  . Lamotrigine     REACTION: mood swing,chills, nausea  . Lansoprazole   . Lisinopril     REACTION: cough  . Lorazepam   . Meloxicam     REACTION: nausea, diarrhea  . Methylphenidate  Hcl   . Metronidazole   . Mirtazapine     REACTION: swelling  . Nabumetone     REACTION: nausea, headache  . Nitrofuran Derivatives Nausea Only  . Olmesartan Medoxomil   . Other  Alum and Mag Hydroxide Simeth(Solution) GI Cocktail. Diarrhea   . Oxycodone-Acetaminophen   . Pantoprazole Sodium   . Paroxetine     REACTION: tremor, insomnia, nausea  . Quetiapine   . Rabeprazole Sodium     REACTION: diarrhea  . Rofecoxib   . Sertraline Hcl     REACTION: exreme confusion  . Sumatriptan     REACTION: cardiologist says "no"  . Telithromycin     REACTION: "drained"  . Topamax     Confusion and mood swings  . Tramadol-Acetaminophen   . Venlafaxine     REACTION: hypertension, nausea, headache  . Verapamil     Headaches and hot flashes   . Vilazodone Hcl [Vilazodone Hcl]     Review of Systems  Constitutional: Negative.  Negative for fever.  HENT: Negative.  Negative for congestion.   Eyes: Negative.  Negative for blurred vision.  Respiratory: Negative.  Negative for cough.   Cardiovascular: Negative.  Negative for chest pain and palpitations.  Gastrointestinal: Negative.  Negative for vomiting.  Genitourinary: Negative.   Musculoskeletal: Negative.  Negative for back pain.  Skin: Negative.  Negative for rash.  Neurological: Negative.  Negative for loss of consciousness and headaches.  Endo/Heme/Allergies: Negative.   Psychiatric/Behavioral: Negative.        Objective:    Physical Exam  Constitutional: She is oriented to person, place, and time. She appears well-developed and well-nourished.  HENT:  Head: Normocephalic and atraumatic.  Eyes: Conjunctivae and EOM are normal.  Neck: Normal range of motion. Neck supple. No JVD present. Carotid bruit is not present. No thyromegaly present.  Cardiovascular: Normal rate, regular rhythm and normal heart sounds.   No murmur heard. Pulmonary/Chest: Effort normal and breath sounds normal. No respiratory distress. She has no  wheezes. She has no rales. She exhibits no tenderness.  Musculoskeletal: She exhibits no edema.  Neurological: She is alert and oriented to person, place, and time.  Psychiatric: She has a normal mood and affect. Her behavior is normal. Judgment and thought content normal.  Nursing note and vitals reviewed.   BP (!) 118/58 (BP Location: Left Arm, Patient Position: Sitting, Cuff Size: Normal)   Pulse 72   Temp 98.5 F (36.9 C) (Oral)   Resp 16   Ht 5\' 6"  (1.676 m)   Wt 158 lb 12.8 oz (72 kg)   SpO2 96%   BMI 25.63 kg/m  Wt Readings from Last 3 Encounters:  12/19/16 158 lb 12.8 oz (72 kg)  12/05/16 158 lb 9.6 oz (71.9 kg)  11/18/16 161 lb 6.4 oz (73.2 kg)     Lab Results  Component Value Date   WBC 7.8 12/05/2016   HGB 13.4 12/05/2016   HCT 40.2 12/05/2016   PLT 225 12/05/2016   GLUCOSE 106 (H) 12/05/2016   CHOL 188 12/19/2016   TRIG 232 (H) 12/19/2016   HDL 44 (L) 12/19/2016   LDLDIRECT 135.0 06/06/2016   LDLCALC 98 12/19/2016   ALT 17 12/05/2016   AST 21 12/05/2016   NA 137 12/05/2016   K 4.6 12/05/2016   CL 101 12/05/2016   CREATININE 0.69 12/05/2016   BUN 14 12/05/2016   CO2 26 12/05/2016   TSH 1.59 12/07/2014   INR 0.9 10/11/2007   HGBA1C 5.9 (H) 12/19/2016   MICROALBUR <0.7 06/06/2016    Lab Results  Component Value Date   TSH 1.59 12/07/2014   Lab Results  Component Value Date   WBC 7.8 12/05/2016   HGB 13.4 12/05/2016  HCT 40.2 12/05/2016   MCV 89.5 12/05/2016   PLT 225 12/05/2016   Lab Results  Component Value Date   NA 137 12/05/2016   K 4.6 12/05/2016   CO2 26 12/05/2016   GLUCOSE 106 (H) 12/05/2016   BUN 14 12/05/2016   CREATININE 0.69 12/05/2016   BILITOT 0.2 12/05/2016   ALKPHOS 56 12/05/2016   AST 21 12/05/2016   ALT 17 12/05/2016   PROT 7.1 12/05/2016   ALBUMIN 4.5 12/05/2016   CALCIUM 9.7 12/05/2016   GFR 86.91 06/06/2016   Lab Results  Component Value Date   CHOL 188 12/19/2016   Lab Results  Component Value Date    HDL 44 (L) 12/19/2016   Lab Results  Component Value Date   LDLCALC 98 12/19/2016   Lab Results  Component Value Date   TRIG 232 (H) 12/19/2016   Lab Results  Component Value Date   CHOLHDL 4.3 12/19/2016   Lab Results  Component Value Date   HGBA1C 5.9 (H) 12/19/2016       Assessment & Plan:   Problem List Items Addressed This Visit      Unprioritized   Essential hypertension   Relevant Medications   spironolactone (ALDACTONE) 25 MG tablet   valsartan (DIOVAN) 320 MG tablet    Other Visit Diagnoses    Hyperlipidemia, unspecified hyperlipidemia type    -  Primary   Relevant Medications   spironolactone (ALDACTONE) 25 MG tablet   valsartan (DIOVAN) 320 MG tablet   Other Relevant Orders   Lipid panel (Completed)   DM (diabetes mellitus) type II controlled peripheral vascular disorder (HCC)       Relevant Medications   metFORMIN (GLUCOPHAGE-XR) 500 MG 24 hr tablet   spironolactone (ALDACTONE) 25 MG tablet   valsartan (DIOVAN) 320 MG tablet   Other Relevant Orders   HgB A1c (Completed)   Diabetic neuropathic arthritis (HCC)       Relevant Medications   metFORMIN (GLUCOPHAGE-XR) 500 MG 24 hr tablet   valsartan (DIOVAN) 320 MG tablet   Other Relevant Orders   Vitamin B12 (Completed)      I am having Ms. Kunin maintain her glucose blood, anti-nausea, ibuprofen, Vitamin C, Misc Natural Products (ESTROVEN ENERGY PO), ALPRAZolam, spironolactone, montelukast, ondansetron, TRINTELLIX, hyoscyamine, amoxicillin-clavulanate, promethazine-dextromethorphan, chlorpheniramine-HYDROcodone, metFORMIN, ranitidine, spironolactone, and valsartan.  Meds ordered this encounter  Medications  . metFORMIN (GLUCOPHAGE-XR) 500 MG 24 hr tablet    Sig: Take 1 tablet (500 mg total) by mouth 2 (two) times daily with a meal.    Dispense:  180 tablet    Refill:  1  . ranitidine (ZANTAC) 150 MG tablet    Sig: TAKE 1 TABLET(150 MG) BY MOUTH DAILY    Dispense:  90 tablet    Refill:  0  .  DISCONTD: valsartan (DIOVAN) 320 MG tablet    Sig: Take 1 tablet (320 mg total) by mouth daily.    Dispense:  90 tablet    Refill:  1  . spironolactone (ALDACTONE) 25 MG tablet    Sig: TAKE 1 TABLET(25 MG) BY MOUTH DAILY    Dispense:  90 tablet    Refill:  0  . valsartan (DIOVAN) 320 MG tablet    Sig: Take 1 tablet (320 mg total) by mouth daily.    Dispense:  90 tablet    Refill:  1    CMA served as Education administrator during this visit. History, Physical and Plan performed by medical provider. Documentation and orders reviewed and attested to.  Ann Held, DO

## 2016-12-19 NOTE — Telephone Encounter (Signed)
When pt came In to appt. She asked about Rx. Showing that Rx is still up front waiting for pick up. Pt says that she no longer need Rx. I shredded it , per PCP

## 2016-12-19 NOTE — Progress Notes (Signed)
Pre visit review using our clinic review tool, if applicable. No additional management support is needed unless otherwise documented below in the visit note. 

## 2016-12-20 LAB — HEMOGLOBIN A1C
HEMOGLOBIN A1C: 5.9 % — AB (ref <5.7)
Mean Plasma Glucose: 123 mg/dL

## 2016-12-20 LAB — LIPID PANEL
CHOLESTEROL: 188 mg/dL (ref <200)
HDL: 44 mg/dL — AB (ref >50)
LDL CALC: 98 mg/dL (ref <100)
TRIGLYCERIDES: 232 mg/dL — AB (ref <150)
Total CHOL/HDL Ratio: 4.3 Ratio (ref <5.0)
VLDL: 46 mg/dL — ABNORMAL HIGH (ref <30)

## 2016-12-21 MED ORDER — VALSARTAN 320 MG PO TABS: 320.0000 mg | ORAL_TABLET | Freq: Every day | ORAL | 1 refills | Status: DC

## 2016-12-22 DIAGNOSIS — H43812 Vitreous degeneration, left eye: Secondary | ICD-10-CM | POA: Diagnosis not present

## 2016-12-22 DIAGNOSIS — H02831 Dermatochalasis of right upper eyelid: Secondary | ICD-10-CM | POA: Diagnosis not present

## 2016-12-22 DIAGNOSIS — H40013 Open angle with borderline findings, low risk, bilateral: Secondary | ICD-10-CM | POA: Diagnosis not present

## 2016-12-22 DIAGNOSIS — E119 Type 2 diabetes mellitus without complications: Secondary | ICD-10-CM | POA: Diagnosis not present

## 2016-12-22 DIAGNOSIS — H02834 Dermatochalasis of left upper eyelid: Secondary | ICD-10-CM | POA: Diagnosis not present

## 2016-12-24 ENCOUNTER — Telehealth: Payer: Self-pay

## 2016-12-24 ENCOUNTER — Other Ambulatory Visit: Payer: Self-pay | Admitting: Family Medicine

## 2016-12-24 DIAGNOSIS — E1169 Type 2 diabetes mellitus with other specified complication: Secondary | ICD-10-CM

## 2016-12-24 DIAGNOSIS — E785 Hyperlipidemia, unspecified: Principal | ICD-10-CM

## 2016-12-24 NOTE — Telephone Encounter (Signed)
Error entry. LB

## 2017-01-01 DIAGNOSIS — Z1231 Encounter for screening mammogram for malignant neoplasm of breast: Secondary | ICD-10-CM | POA: Diagnosis not present

## 2017-01-01 DIAGNOSIS — Z01419 Encounter for gynecological examination (general) (routine) without abnormal findings: Secondary | ICD-10-CM | POA: Diagnosis not present

## 2017-01-15 ENCOUNTER — Other Ambulatory Visit: Payer: Self-pay | Admitting: Family Medicine

## 2017-01-15 DIAGNOSIS — F411 Generalized anxiety disorder: Secondary | ICD-10-CM

## 2017-01-15 NOTE — Telephone Encounter (Signed)
Last refill  #30 on 06/06/2016 Last office visit 12/19/2016 Next scheduled appt. Is 12/19/2016 No UDS/No Contract

## 2017-01-16 NOTE — Telephone Encounter (Signed)
Faxed hardcopy for alprazolam #30 with 0 refills to [facility]

## 2017-02-05 ENCOUNTER — Other Ambulatory Visit: Payer: Self-pay | Admitting: Internal Medicine

## 2017-02-05 DIAGNOSIS — R110 Nausea: Secondary | ICD-10-CM

## 2017-02-05 DIAGNOSIS — K589 Irritable bowel syndrome without diarrhea: Secondary | ICD-10-CM

## 2017-02-05 NOTE — Telephone Encounter (Signed)
May I refill Sir? 

## 2017-02-05 NOTE — Telephone Encounter (Signed)
Refill x 12 

## 2017-02-11 ENCOUNTER — Telehealth: Payer: Self-pay | Admitting: *Deleted

## 2017-02-11 NOTE — Telephone Encounter (Signed)
Patient not available at time of call. Requested call back later today.

## 2017-02-11 NOTE — Telephone Encounter (Signed)
Called patient. AWV w/ Health Coach scheduled prior to upcoming CPE.

## 2017-03-02 ENCOUNTER — Encounter: Payer: Self-pay | Admitting: Interventional Cardiology

## 2017-03-02 DIAGNOSIS — Q244 Congenital subaortic stenosis: Secondary | ICD-10-CM | POA: Insufficient documentation

## 2017-03-02 NOTE — Progress Notes (Signed)
Cardiology Office Note    Date:  03/03/2017   ID:  Cynthia Gilmore, DOB [birth year], MRN [id number]  PCP:  Ann Held, DO  Cardiologist: Sinclair Grooms, MD   Chief Complaint  Patient presents with  . Palpitations    History of Present Illness:  Cynthia Gilmore is a 83 y.o. female  With h/o tachypalpitations, systolic murmur, hypertension, and h/o TIA.  She is here today for evaluation of intermittent subxiphoid chest discomfort, pounding and irregular heartbeat. The pounding occurs particularly with physical activity. Activities such as vacuuming and making the bed causes dizzy spells, heart skipping, and cough. It does not seem to precipitate the chest discomfort. The chest discomfort is more often present after eating.  She additionally complains of headaches, feeling drained, and heartburn.  The patient saw Dr. Wynonia Lawman last year and had a workup for very similar complaints. A stress test was done greater than a year ago by Dr. Wynonia Lawman.  Past Medical History:  Diagnosis Date  . Allergy   . Anxiety   . Anxiety and depression    ? bipolar  . Cataract    bil cataracts removed  . Depression   . Diabetes mellitus, type 2 ([place])   . GERD (gastroesophageal reflux disease)   . Heart murmur   . Hemorrhoids 2012   . Hiatal hernia   . Hypertension   . IBS (irritable bowel syndrome)   . Lymphocytic colitis 2012  . Mumerous medication allergies and sensitivities 07/20/2015  . Osteoarthritis (arthritis due to wear and tear of joints)   . Segmental colitis ([place])    iscemic colitis 2009  . Stroke [facility])    states 2 "'mini strokes" 1980  . TIA (transient ischemic attack) 2000   x 3     Past Surgical History:  Procedure Laterality Date  . ANKLE SURGERY  1999   right ankle fracture surgery  . BLADDER REPAIR  1995  . CATARACT EXTRACTION, BILATERAL    . CHOLECYSTECTOMY  2004  . COLONOSCOPY W/ BIOPSIES AND POLYPECTOMY   07/29/2011   internal hemorrhoids, lymphocytic  colitis  . ESOPHAGEAL MANOMETRY     Normal  . ESOPHAGOGASTRODUODENOSCOPY  07/29/2011   gastritis,hiatal hernia, tortuous esophagus  . EUS  06/03/2012   Procedure: UPPER ENDOSCOPIC ULTRASOUND (EUS) LINEAR;  Surgeon: Milus Banister, MD;  Location: [facility];  Service: Endoscopy;  Laterality: N/A;  . HERNIA REPAIR  1995  . PARTIAL HYSTERECTOMY  1992   partial    Current Medications: Outpatient Medications Prior to Visit  Medication Sig Dispense Refill  . ALPRAZolam (XANAX) 0.25 MG tablet TAKE 1/2 TO 1 TABLET BY MOUTH ONCE DAILY AS NEEDED 30 tablet 0  . anti-nausea (EMETROL) solution Take 5 mLs by mouth as needed for nausea or vomiting.    . Ascorbic Acid (VITAMIN C) 500 MG CAPS Take by mouth.    . chlorpheniramine-HYDROcodone (TUSSIONEX PENNKINETIC ER) 10-8 MG/5ML SUER Take 5 mLs by mouth at bedtime as needed for cough. 140 mL 0  . glucose blood (ACCU-CHEK SMARTVIEW) test strip 1 each by Other route daily. 100 each 12  . hyoscyamine (ANASPAZ) 0.125 MG TBDP disintergrating tablet Place 1 tablet (0.125 mg total) under the tongue every 4 (four) hours as needed for cramping. 90 tablet 5  . ibuprofen (ADVIL,MOTRIN) 200 MG tablet Take 200 mg by mouth every 6 (six) hours as needed.    . metFORMIN (GLUCOPHAGE-XR) 500 MG 24 hr tablet Take 1 tablet (500  mg total) by mouth 2 (two) times daily with a meal. 180 tablet 1  . Misc Natural Products (ESTROVEN ENERGY PO) Take by mouth.    . montelukast (SINGULAIR) 10 MG tablet Take 1 tablet (10 mg total) by mouth at bedtime. 30 tablet 5  . ondansetron (ZOFRAN) 4 MG tablet Take 4 mg by mouth every 8 (eight) hours as needed for nausea or vomiting (Takes as needed.).    Marland Kitchen ondansetron (ZOFRAN) 4 MG tablet TAKE 1 TABLET BY MOUTH FOUR TIMES DAILY( BEFORE A MEAL AND EVERY NIGHT AT BEDTIME) 120 tablet 11  . promethazine-dextromethorphan (PROMETHAZINE-DM) 6.25-15 MG/5ML syrup Take 5 mLs by mouth 4 (four) times daily as needed. 118 mL 0  . ranitidine (ZANTAC) 150 MG  tablet TAKE 1 TABLET(150 MG) BY MOUTH DAILY 90 tablet 0  . spironolactone (ALDACTONE) 25 MG tablet Take 1 tablet (25 mg total) by mouth daily. 90 tablet 1  . spironolactone (ALDACTONE) 25 MG tablet TAKE 1 TABLET(25 MG) BY MOUTH DAILY 90 tablet 0  . TRINTELLIX 20 MG TABS Take 20 mg by mouth daily.  3  . valsartan (DIOVAN) 320 MG tablet Take 1 tablet (320 mg total) by mouth daily. 90 tablet 1  . amoxicillin-clavulanate (AUGMENTIN) 875-125 MG tablet Take 1 tablet by mouth 2 (two) times daily. (Patient not taking: Reported on 12/19/2016) 20 tablet 0   No facility-administered medications prior to visit.      Allergies:   Alprazolam; Amlodipine besylate; Aripiprazole; Augmentin [amoxicillin-pot clavulanate]; Azithromycin; Bupropion hcl; Cefaclor; Celecoxib; Cephalexin; Ciprofloxacin; Clonidine hydrochloride; Desvenlafaxine; Diphenoxylate-atropine; Divalproex sodium; Doxycycline; Entex; Est estrogens-methyltest; Fluoxetine hcl; Furosemide; Hydrochlorothiazide; Hydrocodone-acetaminophen; Lamotrigine; Lansoprazole; Lisinopril; Lorazepam; Meloxicam; Methylphenidate hcl; Metronidazole; Mirtazapine; Nabumetone; Nitrofuran derivatives; Olmesartan medoxomil; Other; Oxycodone-acetaminophen; Pantoprazole sodium; Paroxetine; Quetiapine; Rabeprazole sodium; Rofecoxib; Sertraline hcl; Sumatriptan; Telithromycin; Topamax; Tramadol-acetaminophen; Venlafaxine; Verapamil; and Vilazodone hcl [vilazodone hcl]   Social History   Social History  . Marital status: Married    Spouse name: N/A  . Number of children: 2  . Years of education: N/A   Occupational History  . Retired Retired   Social History Main Topics  . Smoking status: Former Smoker    Types: Cigarettes  . Smokeless tobacco: Never Used     Comment: stopped in 1962  . Alcohol use No  . Drug use: No  . Sexual activity: Not Asked   Other Topics Concern  . None   Social History Narrative   Occupation: retired   Patient is a former smoker. -stopped  1962   Alcohol Use - no     Illicit Drug Use - no         Daily Caffeine Use  -1 cup daily           Position roster   Cardiologist-Dr. Tamala Julian   GYN-Dr. Corinna Capra   Ophthalmologist-Dr. Katy Fitch   Gastroenterologist-Dr. Carlean Purl     Family History:  The patient's family history includes Diabetes in her maternal aunt, maternal uncle, and mother; Heart disease in her father and paternal aunt.   ROS:   Please see the history of present illness.    Skipped heartbeats, fatigue, excessive sweating, snoring, nausea, anxiety, headache, dizziness, back pain, depression, diarrhea, cough, hearing loss, and unexplained weight loss no energy, short temper, depression currently being treated without medication but not under good control according to the patient. Please note a 2 paGe list of medication allergies.  All other systems reviewed and are negative.   PHYSICAL EXAM:   VS:  BP 124/60   Pulse 78  Ht 5\' 6"  (1.676 m)   Wt 158 lb 6.4 oz (71.8 kg)   SpO2 97%   BMI 25.57 kg/m    GEN: Well nourished, well developed, in no acute distress  HEENT: normal  Neck: no JVD, carotid bruits, or masses Cardiac: RRR; no murmurs, rubs, or gallops,no edema  Respiratory:  clear to auscultation bilaterally, normal work of breathing GI: soft, nontender, nondistended, + BS MS: no deformity or atrophy  Skin: warm and dry, no rash Neuro:  Alert and Oriented x 3, Strength and sensation are intact Psych: euthymic mood, full affect  Wt Readings from Last 3 Encounters:  03/03/17 158 lb 6.4 oz (71.8 kg)  12/19/16 158 lb 12.8 oz (72 kg)  12/05/16 158 lb 9.6 oz (71.9 kg)      Studies/Labs Reviewed:   EKG:  EKG  Normal sinus rhythm, right bundle branch block, and when compared to prior from 2014, no change has occurred.  Recent Labs: 12/05/2016: ALT 17; BUN 14; Creat 0.69; Hemoglobin 13.4; Platelets 225; Potassium 4.6; Sodium 137   Lipid Panel    Component Value Date/Time   CHOL 188 12/19/2016 1457   TRIG 232  (H) 12/19/2016 1457   HDL 44 (L) 12/19/2016 1457   CHOLHDL 4.3 12/19/2016 1457   VLDL 46 (H) 12/19/2016 1457   LDLCALC 98 12/19/2016 1457   LDLDIRECT 135.0 06/06/2016 1354    Additional studies/ records that were reviewed today include:  Echocardiogram 2014: Study Conclusions  - Left ventricle: The cavity size was normal. There was mild focal basal hypertrophy of the septum. Systolic function was vigorous. The estimated ejection fraction was in the range of 65% to 70%. Wall motion was normal; there were no regional wall motion abnormalities. - Aortic valve: Mild sub aortic valve gradient. - Mitral valve: There was systolic anterior motion. Mild regurgitation.   ASSESSMENT:    1. Palpitations   2. Chest discomfort   3. Essential hypertension   4. Subvalvular aortic stenosis   5. Type II diabetes mellitus with neurological manifestations ([place])   6. Severe episode of recurrent major depressive disorder, without psychotic features ([place])      PLAN:  In order of problems listed above:  1. Exertional palpitations are concerning. A 30 day monitor will be done to exclude atrial fibrillation or ventricular arrhythmia. 2. Stress Cardiolite will be performed to attempt to provoke changes in heart rate that causes symptoms and also to exclude myocardial ischemia. 3. Adequate blood pressure control is noted. No changes are required. 4. Soft systolic murmur noted on previous exams is not present today. Echocardiogram performed and 14 revealed a very minimal some valvular aortic outflow gradient. This is not a clinical diagnosis and will be removed from the problem list. 5. Not addressed 6. Likely that her current complaints all related to her psychiatric illness.  The patient has a significant anxiety/depression problem. This is a prolonged office visit with greater than 50% of the time spent in counseling. Greater than 40 minutes was spent during this office  visit.  Medication Adjustments/Labs and Tests Ordered: Current medicines are reviewed at length with the patient today.  Concerns regarding medicines are outlined above.  Medication changes, Labs and Tests ordered today are listed in the Patient Instructions below. Patient Instructions  Medication Instructions:  None  Labwork: None  Testing/Procedures: Your physician has recommended that you wear an event monitor. Event monitors are medical devices that record the heart's electrical activity. Doctors most often [place] these monitors to diagnose arrhythmias.  Arrhythmias are problems with the speed or rhythm of the heartbeat. The monitor is a small, portable device. You can wear one while you do your normal daily activities. This is usually used to diagnose what is causing palpitations/syncope (passing out).  Your physician has requested that you have en exercise stress myoview. For further information please visit [email or web address]. Please follow instruction sheet, as given.    Follow-Up: Your physician recommends that you schedule a follow-up appointment as needed with Dr.Smith.   Any Other Special Instructions Will Be Listed Below (If Applicable).     If you need a refill on your cardiac medications before your next appointment, please call your pharmacy.      Signed, Sinclair Grooms, MD  03/03/2017 8:46 AM    [facility] [address], [place],   [address] Phone: [phone]; Fax: [phone]

## 2017-03-03 ENCOUNTER — Encounter: Payer: Self-pay | Admitting: Interventional Cardiology

## 2017-03-03 ENCOUNTER — Ambulatory Visit (INDEPENDENT_AMBULATORY_CARE_PROVIDER_SITE_OTHER): Payer: Medicare Other | Admitting: Interventional Cardiology

## 2017-03-03 VITALS — BP 124/60 | HR 78 | Ht 66.0 in | Wt 158.4 lb

## 2017-03-03 DIAGNOSIS — E1149 Type 2 diabetes mellitus with other diabetic neurological complication: Secondary | ICD-10-CM

## 2017-03-03 DIAGNOSIS — R002 Palpitations: Secondary | ICD-10-CM

## 2017-03-03 DIAGNOSIS — R0789 Other chest pain: Secondary | ICD-10-CM

## 2017-03-03 DIAGNOSIS — I10 Essential (primary) hypertension: Secondary | ICD-10-CM | POA: Diagnosis not present

## 2017-03-03 DIAGNOSIS — Q244 Congenital subaortic stenosis: Secondary | ICD-10-CM | POA: Diagnosis not present

## 2017-03-03 DIAGNOSIS — F332 Major depressive disorder, recurrent severe without psychotic features: Secondary | ICD-10-CM | POA: Diagnosis not present

## 2017-03-03 NOTE — Patient Instructions (Signed)
Medication Instructions:  None  Labwork: None  Testing/Procedures: Your physician has recommended that you wear an event monitor. Event monitors are medical devices that record the heart's electrical activity. Doctors most often [place] these monitors to diagnose arrhythmias. Arrhythmias are problems with the speed or rhythm of the heartbeat. The monitor is a small, portable device. You can wear one while you do your normal daily activities. This is usually used to diagnose what is causing palpitations/syncope (passing out).  Your physician has requested that you have en exercise stress myoview. For further information please visit [email or web address]. Please follow instruction sheet, as given.    Follow-Up: Your physician recommends that you schedule a follow-up appointment as needed with Dr.Smith.   Any Other Special Instructions Will Be Listed Below (If Applicable).     If you need a refill on your cardiac medications before your next appointment, please call your pharmacy.

## 2017-03-12 ENCOUNTER — Telehealth (HOSPITAL_COMMUNITY): Payer: Self-pay | Admitting: *Deleted

## 2017-03-12 NOTE — Telephone Encounter (Signed)
Patient given detailed instructions per Myocardial Perfusion Study Information Sheet for the test on 03/17/17. Patient notified to arrive 15 minutes early and that it is imperative to arrive on time for appointment to keep from having the test rescheduled.  If you need to cancel or reschedule your appointment, please call the office within 24 hours of your appointment. Failure to do so may result in a cancellation of your appointment, and a $50 no show fee. Patient verbalized understanding.  Kirstie Peri

## 2017-03-17 ENCOUNTER — Ambulatory Visit (INDEPENDENT_AMBULATORY_CARE_PROVIDER_SITE_OTHER): Payer: Medicare Other

## 2017-03-17 ENCOUNTER — Ambulatory Visit (HOSPITAL_COMMUNITY): Payer: Medicare Other | Attending: Cardiovascular Disease

## 2017-03-17 DIAGNOSIS — R002 Palpitations: Secondary | ICD-10-CM

## 2017-03-17 DIAGNOSIS — I510 Cardiac septal defect, acquired: Secondary | ICD-10-CM | POA: Diagnosis not present

## 2017-03-17 DIAGNOSIS — R0789 Other chest pain: Secondary | ICD-10-CM

## 2017-03-17 DIAGNOSIS — R9439 Abnormal result of other cardiovascular function study: Secondary | ICD-10-CM | POA: Diagnosis not present

## 2017-03-17 LAB — MYOCARDIAL PERFUSION IMAGING
CHL CUP NUCLEAR SRS: 5
CHL RATE OF PERCEIVED EXERTION: 17
CSEPEW: 7 METS
Exercise duration (min): 6 min
Exercise duration (sec): 0 s
LV sys vol: 11 mL
LVDIAVOL: 56 mL (ref 46–106)
MPHR: 139 {beats}/min
NUC STRESS TID: 1.03
Peak HR: 130 {beats}/min
Percent HR: 94 %
RATE: 0.21
Rest HR: 77 {beats}/min
SDS: 5
SSS: 10

## 2017-03-17 MED ORDER — TECHNETIUM TC 99M TETROFOSMIN IV KIT
10.1000 | PACK | Freq: Once | INTRAVENOUS | Status: AC | PRN
  Administered 2017-03-17: 10.1 via INTRAVENOUS
  Filled 2017-03-17: qty 11

## 2017-03-17 MED ORDER — TECHNETIUM TC 99M TETROFOSMIN IV KIT
32.5000 | PACK | Freq: Once | INTRAVENOUS | Status: AC | PRN
  Administered 2017-03-17: 32.5 via INTRAVENOUS
  Filled 2017-03-17: qty 33

## 2017-03-19 ENCOUNTER — Telehealth: Payer: Self-pay | Admitting: Interventional Cardiology

## 2017-03-19 NOTE — Telephone Encounter (Signed)
Spoke with pt and made her aware that she did need to continue to wear the monitor.  Pt verbalized understanding and was in agreement with this plan.

## 2017-03-19 NOTE — Telephone Encounter (Signed)
New message    Pt is calling to find out if she needs to continue to wear to the heart monitor since her stress test was ok?

## 2017-03-22 ENCOUNTER — Other Ambulatory Visit: Payer: Self-pay | Admitting: Family Medicine

## 2017-03-22 DIAGNOSIS — F411 Generalized anxiety disorder: Secondary | ICD-10-CM

## 2017-03-23 NOTE — Telephone Encounter (Signed)
Faxed hardcopy for alprazolam to [facility]

## 2017-03-23 NOTE — Telephone Encounter (Signed)
Requesting:    alprazolam Contract     none UDS    none Last OV     12/19/2016-----06/22/2017 Last Refill    #30 with no refills on 01/15/2017  Please Advise

## 2017-03-24 ENCOUNTER — Telehealth: Payer: Self-pay | Admitting: Family Medicine

## 2017-03-24 ENCOUNTER — Other Ambulatory Visit (INDEPENDENT_AMBULATORY_CARE_PROVIDER_SITE_OTHER): Payer: Medicare Other

## 2017-03-24 DIAGNOSIS — E1151 Type 2 diabetes mellitus with diabetic peripheral angiopathy without gangrene: Secondary | ICD-10-CM | POA: Diagnosis not present

## 2017-03-24 DIAGNOSIS — E1169 Type 2 diabetes mellitus with other specified complication: Secondary | ICD-10-CM

## 2017-03-24 DIAGNOSIS — E785 Hyperlipidemia, unspecified: Secondary | ICD-10-CM

## 2017-03-24 LAB — LIPID PANEL
Cholesterol: 196 mg/dL (ref 0–200)
HDL: 49.4 mg/dL (ref >39.00)
NonHDL: 146.31
Total CHOL/HDL Ratio: 4
Triglycerides: 209 mg/dL — ABNORMAL HIGH (ref 0.0–149.0)
VLDL: 41.8 mg/dL — ABNORMAL HIGH (ref 0.0–40.0)

## 2017-03-24 LAB — COMPREHENSIVE METABOLIC PANEL
ALT: 17 U/L (ref 0–35)
AST: 19 U/L (ref 0–37)
Albumin: 4.6 g/dL (ref 3.5–5.2)
Alkaline Phosphatase: 58 U/L (ref 39–117)
BUN: 14 mg/dL (ref 6–23)
CO2: 29 mEq/L (ref 19–32)
Calcium: 10 mg/dL (ref 8.4–10.5)
Chloride: 102 mEq/L (ref 96–112)
Creatinine, Ser: 0.65 mg/dL (ref 0.40–1.20)
GFR: 92.93 mL/min (ref >60.00)
Glucose, Bld: 106 mg/dL — ABNORMAL HIGH (ref 70–99)
Potassium: 4.3 mEq/L (ref 3.5–5.1)
Sodium: 139 mEq/L (ref 135–145)
Total Bilirubin: 0.4 mg/dL (ref 0.2–1.2)
Total Protein: 7.6 g/dL (ref 6.0–8.3)

## 2017-03-24 LAB — LDL CHOLESTEROL, DIRECT: LDL DIRECT: 124 mg/dL

## 2017-03-24 LAB — HEMOGLOBIN A1C: Hgb A1c MFr Bld: 6.2 % (ref 4.6–6.5)

## 2017-03-24 NOTE — Telephone Encounter (Signed)
Faxed Medical records request to medical records to be released to Dr. Marlou Sa [place] office.

## 2017-03-30 ENCOUNTER — Other Ambulatory Visit: Payer: Self-pay | Admitting: Family Medicine

## 2017-03-30 DIAGNOSIS — E785 Hyperlipidemia, unspecified: Secondary | ICD-10-CM

## 2017-04-06 DIAGNOSIS — N3000 Acute cystitis without hematuria: Secondary | ICD-10-CM | POA: Diagnosis not present

## 2017-04-14 DIAGNOSIS — E7800 Pure hypercholesterolemia, unspecified: Secondary | ICD-10-CM | POA: Diagnosis not present

## 2017-04-14 DIAGNOSIS — E119 Type 2 diabetes mellitus without complications: Secondary | ICD-10-CM | POA: Diagnosis not present

## 2017-04-14 DIAGNOSIS — I10 Essential (primary) hypertension: Secondary | ICD-10-CM | POA: Diagnosis not present

## 2017-04-14 DIAGNOSIS — K219 Gastro-esophageal reflux disease without esophagitis: Secondary | ICD-10-CM | POA: Diagnosis not present

## 2017-04-15 ENCOUNTER — Telehealth: Payer: Self-pay | Admitting: Family Medicine

## 2017-04-15 NOTE — Telephone Encounter (Signed)
Relation to OY:DXAJ Call back number:[phone] Pharmacy:  Reason for call:  Patient requesting most recent labs please mail to home.

## 2017-04-15 NOTE — Telephone Encounter (Signed)
Letter mailed out to patient.  Patient also asked about her medical records being sent to Dr. Mickle Plumb office and his office never received them.

## 2017-04-15 NOTE — Telephone Encounter (Signed)
Patient stated that Dr. Wilma Flavin office never received her medical records from our office.  Is there anyway we can check on this or make another request.?

## 2017-04-16 ENCOUNTER — Telehealth: Payer: Self-pay | Admitting: Interventional Cardiology

## 2017-04-16 MED ORDER — METOPROLOL SUCCINATE ER 25 MG PO TB24: 25.0000 mg | ORAL_TABLET | Freq: Every day | ORAL | 3 refills | Status: DC

## 2017-04-16 NOTE — Telephone Encounter (Signed)
Spoke with pt and made her aware of recommendation per Dr. Tamala Julian.  Pt verbalized understanding and was in agreement with this plan.

## 2017-04-16 NOTE — Telephone Encounter (Signed)
*  Metoprolol succinate 25 mg per day.

## 2017-04-16 NOTE — Telephone Encounter (Signed)
Left message on machine with number.

## 2017-04-16 NOTE — Telephone Encounter (Signed)
The patient would need to follow up with medical records [phone] to see where they are in the process of completing the request. I have also faxed it again.

## 2017-04-16 NOTE — Telephone Encounter (Signed)
Patient calling, states that heart rate has been elevated since 04-14-17 and also would like you to know that she sent back her monitor this morning. Thanks.

## 2017-04-16 NOTE — Telephone Encounter (Signed)
Pt states that she mailed her event monitor back today.  Wanted to let us know that her HR and BP have been elevated for a few days.  States recent BPs were 155/126, 164/128, 174/131 (yesterday) and 134/74 (today).  Recent HRs are 135 (Tues when outside mowing), 120 (yesterday PM), 119 today.  Denies dizziness, lightheadedness, HA or CP.  States she does feel the fluttering.  Stress level is up currently due to issues in personal life.  Pt seen PCP, Dr. Donnie Coffin, Tuesday and mentioned her BP and HR.  States BP at PCP office was normal, 120/??, couldn't remember diastolic.  Pt can feel fluttering no matter what she is doing but seems to worsen when exerting.  Advised pt to make sure she stays hydrated when working outside and that I will send information to Dr. Tamala Julian for review.  Pt verbalized understanding.

## 2017-04-24 ENCOUNTER — Telehealth: Payer: Self-pay | Admitting: Interventional Cardiology

## 2017-04-24 NOTE — Telephone Encounter (Signed)
Pt c/o medication issue:  1. Name of Medication: metoporolol  2. How are you currently taking this medication (dosage and times per day)? 25mg  1x day  3. Are you having a reaction (difficulty breathing--STAT)? no  4. What is your medication issue? Pt verbalized that the medication is making her sugar low to 77

## 2017-04-24 NOTE — Telephone Encounter (Signed)
Spoke with Jinny Blossom, [facility] and she said Metoprolol shouldn't be causing a major difference in BS, it can mask low BS.  Spoke with pt and she states BS is normally 90-100 and this morning it was 77.  Pt is asymptomatic for hypoglycemia.  States she did not eat much for dinner last night.  Pt states that she just finished eating a honey bun and drinking her coffee.  Advised pt to monitor BS in the AM over the weekend and call on Monday if it continues to run low.  Pt verbalized understanding and was in agreement with this plan.

## 2017-05-08 ENCOUNTER — Telehealth: Payer: Self-pay | Admitting: Interventional Cardiology

## 2017-05-08 NOTE — Telephone Encounter (Signed)
Pt started on Metoprolol Succinate 25mg  QD on 04/17/17. States since she started it she has been more fatigued and has no energy at all. Says in the last 2 weeks she has felt very depressed.  BP has been around 120/76, HR 70-89.  Palpitations have resolved.  Pt states she has been eating less then she use to and ultimately this has caused blood sugar to drop down between 70-80, where she was 85-106 prior to medication.  Advised I would send message to Dr. Tamala Julian for review and advisement.  Pt appreciative for call.

## 2017-05-08 NOTE — Telephone Encounter (Signed)
Pt c/o medication issue:  1. Name of Medication: metropolol  2. How are you currently taking this medication (dosage and times per day)? 25mg  1day   3. Are you having a reaction (difficulty breathing--STAT)? no  4. What is your medication issue? Weak tired and depressed

## 2017-05-08 NOTE — Telephone Encounter (Signed)
Decrease metoprolol to one half tablet per day

## 2017-05-11 MED ORDER — METOPROLOL SUCCINATE ER 25 MG PO TB24: 12.5000 mg | ORAL_TABLET | Freq: Every day | ORAL | 3 refills | Status: DC

## 2017-05-11 NOTE — Telephone Encounter (Signed)
Spoke with pt and went over recommendations per Dr. Smith.  Pt verbalized understanding and was in agreement with this plan.   

## 2017-05-15 ENCOUNTER — Telehealth: Payer: Self-pay | Admitting: Interventional Cardiology

## 2017-05-15 NOTE — Telephone Encounter (Signed)
New message     Pt c/o medication issue:  1. Name of Medication:  metoprolol succinate (TOPROL XL) 25 MG 24 hr tablet Take 0.5 tablets (12.5 mg total) by mouth daily.     2. How are you currently taking this medication (dosage and times per day)?  As prescribed  3. Are you having a reaction (difficulty breathing--STAT)? no  4. What is your medication issue?  Diarrhea

## 2017-05-15 NOTE — Telephone Encounter (Signed)
Returned call to patient, reports she has been experiencing diarrhea x 4 days on and off.  Patient wondering if this could be from new medication she started 1 month ago-metoprolol.   Patient reports she also has IBS that could be flaring up.  Denies N/V/abdominal pain, or sick contacts.   Patient taking 12.5mg  daily.    Advised I would to Dr. Tamala Julian for recommendations.  Patient aware and verbalized understanding.

## 2017-05-18 NOTE — Telephone Encounter (Signed)
Uncommon to have diarrhea related to beta blocker, especially at this low dose. Needs to check with her GI/IM physician and get their take.

## 2017-05-20 NOTE — Telephone Encounter (Signed)
Spoke with pt and made her aware of recommendations per Dr. Tamala Julian.  Pt states she is doing much better now.

## 2017-06-16 ENCOUNTER — Other Ambulatory Visit: Payer: Self-pay | Admitting: Family Medicine

## 2017-06-16 DIAGNOSIS — E1151 Type 2 diabetes mellitus with diabetic peripheral angiopathy without gangrene: Secondary | ICD-10-CM

## 2017-06-17 NOTE — Progress Notes (Deleted)
Subjective:   Cynthia Gilmore is a 83 y.o. female who presents for Medicare Annual (Subsequent) preventive examination.  Review of Systems:  No ROS.  Medicare Wellness Visit. Additional risk factors are reflected in the social history.    Sleep patterns:  Home Safety/Smoke Alarms: Feels safe in home. Smoke alarms in place.  Living environment; residence and Firearm Safety:  [place] Safety/Bike Helmet: Wears seat belt.   Counseling:   Eye Exam-  Dental-  Female:   Pap- Hysterectomy   Mammo-  Last 09/24/12:  BI-RADS CATEGORY 1:  Negative.    Dexa scan- Last  09/10/15: osteopenia CCS- Last 07/29/11: Normal. No longer doing routine screening due to age.     Objective:     Vitals: There were no vitals taken for this visit.  There is no height or weight on file to calculate BMI.   Tobacco History  Smoking Status  . Former Smoker  . Types: Cigarettes  Smokeless Tobacco  . Never Used    Comment: stopped in 1962     Counseling given: Not Answered   Past Medical History:  Diagnosis Date  . Allergy   . Anxiety   . Anxiety and depression    ? bipolar  . Cataract    bil cataracts removed  . Depression   . Diabetes mellitus, type 2 ([place])   . GERD (gastroesophageal reflux disease)   . Heart murmur   . Hemorrhoids 2012   . Hiatal hernia   . Hypertension   . IBS (irritable bowel syndrome)   . Lymphocytic colitis 2012  . Mumerous medication allergies and sensitivities 07/20/2015  . Osteoarthritis (arthritis due to wear and tear of joints)   . Segmental colitis ([place])    iscemic colitis 2009  . Stroke [facility])    states 2 "'mini strokes" 1980  . TIA (transient ischemic attack) 2000   x 3    Past Surgical History:  Procedure Laterality Date  . ANKLE SURGERY  1999   right ankle fracture surgery  . BLADDER REPAIR  1995  . CATARACT EXTRACTION, BILATERAL    . CHOLECYSTECTOMY  2004  . COLONOSCOPY W/ BIOPSIES AND POLYPECTOMY   07/29/2011   internal hemorrhoids,  lymphocytic colitis  . ESOPHAGEAL MANOMETRY     Normal  . ESOPHAGOGASTRODUODENOSCOPY  07/29/2011   gastritis,hiatal hernia, tortuous esophagus  . EUS  06/03/2012   Procedure: UPPER ENDOSCOPIC ULTRASOUND (EUS) LINEAR;  Surgeon: Milus Banister, MD;  Location: [facility];  Service: Endoscopy;  Laterality: N/A;  . HERNIA REPAIR  1995  . PARTIAL HYSTERECTOMY  1992   partial   Family History  Problem Relation Age of Onset  . Diabetes Mother   . Heart disease Father   . Diabetes Maternal Aunt   . Diabetes Maternal Uncle   . Heart disease Paternal Aunt   . Colon cancer Neg Hx    History  Sexual Activity  . Sexual activity: Not on file    Outpatient Encounter Prescriptions as of 06/22/2017  Medication Sig  . ALPRAZolam (XANAX) 0.25 MG tablet TAKE 1/2 TO 1 TABLET BY MOUTH ONCE DAILY AS NEEDED  . anti-nausea (EMETROL) solution Take 5 mLs by mouth as needed for nausea or vomiting.  . Ascorbic Acid (VITAMIN C) 500 MG CAPS Take by mouth.  . chlorpheniramine-HYDROcodone (TUSSIONEX PENNKINETIC ER) 10-8 MG/5ML SUER Take 5 mLs by mouth at bedtime as needed for cough.  Marland Kitchen glucose blood (ACCU-CHEK SMARTVIEW) test strip 1 each by Other route daily.  Marland Kitchen  hyoscyamine (ANASPAZ) 0.125 MG TBDP disintergrating tablet Place 1 tablet (0.125 mg total) under the tongue every 4 (four) hours as needed for cramping.  Marland Kitchen ibuprofen (ADVIL,MOTRIN) 200 MG tablet Take 200 mg by mouth every 6 (six) hours as needed.  . metFORMIN (GLUCOPHAGE-XR) 500 MG 24 hr tablet TAKE 1 TABLET(500 MG) BY MOUTH TWICE DAILY WITH A MEAL  . metoprolol succinate (TOPROL XL) 25 MG 24 hr tablet Take 0.5 tablets (12.5 mg total) by mouth daily.  . Misc Natural Products (ESTROVEN ENERGY PO) Take by mouth.  . montelukast (SINGULAIR) 10 MG tablet Take 1 tablet (10 mg total) by mouth at bedtime.  . ondansetron (ZOFRAN) 4 MG tablet Take 4 mg by mouth every 8 (eight) hours as needed for nausea or vomiting (Takes as needed.).  Marland Kitchen ondansetron (ZOFRAN) 4 MG  tablet TAKE 1 TABLET BY MOUTH FOUR TIMES DAILY( BEFORE A MEAL AND EVERY NIGHT AT BEDTIME)  . promethazine-dextromethorphan (PROMETHAZINE-DM) 6.25-15 MG/5ML syrup Take 5 mLs by mouth 4 (four) times daily as needed.  . ranitidine (ZANTAC) 150 MG tablet TAKE 1 TABLET(150 MG) BY MOUTH DAILY  . spironolactone (ALDACTONE) 25 MG tablet Take 1 tablet (25 mg total) by mouth daily.  Marland Kitchen spironolactone (ALDACTONE) 25 MG tablet TAKE 1 TABLET(25 MG) BY MOUTH DAILY  . TRINTELLIX 20 MG TABS Take 20 mg by mouth daily.  . valsartan (DIOVAN) 320 MG tablet Take 1 tablet (320 mg total) by mouth daily.   No facility-administered encounter medications on file as of 06/22/2017.     Activities of Daily Living No flowsheet data found.  Patient Care Team: Alroy Dust, L.Marlou Sa, MD as PCP - General (Family Medicine) Sheralyn Boatman, MD as Consulting Physician (Psychiatry) Gatha Mayer, MD as Consulting Physician (Gastroenterology) Louretta Shorten, MD as Consulting Physician (Obstetrics and Gynecology) Warden Fillers, MD as Consulting Physician (Ophthalmology)    Assessment:    Physical assessment deferred to PCP.  Exercise Activities and Dietary recommendations   Diet (meal preparation, eat out, water intake, caffeinated beverages, dairy products, fruits and vegetables): {Desc; diets:16563} Breakfast: Lunch:  Dinner:      Goals    None     Fall Risk Fall Risk  12/05/2016 09/01/2016 01/04/2016 01/04/2016 01/04/2016  Falls in the past year? No Yes No No No  Number falls in past yr: - 1 - - -  Injury with Fall? - Yes - - -   Depression Screen PHQ 2/9 Scores 12/05/2016 09/01/2016 01/04/2016 01/04/2016  PHQ - 2 Score 0 0 0 0  PHQ- 9 Score - - - -     Cognitive Function        Immunization History  Administered Date(s) Administered  . Influenza Split 08/26/2016  . Influenza Whole 09/21/2007, 08/25/2008, 08/21/2009, 08/15/2010  . Influenza, High Dose Seasonal PF 08/24/2014, 08/16/2015  .  Influenza,inj,Quad PF,36+ Mos 08/31/2013, 08/24/2014  . Pneumococcal Conjugate-13 09/03/2015  . Pneumococcal Polysaccharide-23 09/09/1999, 07/15/2013  . Td 09/14/2006  . Zoster 01/11/2009   Screening Tests Health Maintenance  Topic Date Due  . OPHTHALMOLOGY EXAM  11/25/2015  . TETANUS/TDAP  09/14/2016  . INFLUENZA VACCINE  06/24/2017  . HEMOGLOBIN A1C  09/24/2017  . FOOT EXAM  12/19/2017  . DEXA SCAN  Completed  . PNA vac Low Risk Adult  Completed      Plan:   ***   I have personally reviewed and noted the following in the patient's chart:   . Medical and social history . Use of alcohol, tobacco or illicit drugs  .  Current medications and supplements . Functional ability and status . Nutritional status . Physical activity . Advanced directives . List of other physicians . Hospitalizations, surgeries, and ER visits in previous 12 months . Vitals . Screenings to include cognitive, depression, and falls . Referrals and appointments  In addition, I have reviewed and discussed with patient certain preventive protocols, quality metrics, and best practice recommendations. A written personalized care plan for preventive services as well as general preventive health recommendations were provided to patient.     Naaman Plummer [place], [place]  06/17/2017

## 2017-06-22 ENCOUNTER — Encounter: Payer: Medicare Other | Admitting: Family Medicine

## 2017-07-28 ENCOUNTER — Ambulatory Visit
Admission: RE | Admit: 2017-07-28 | Discharge: 2017-07-28 | Disposition: A | Discharge status: Home / Self Care | Payer: Medicare Other | Source: Ambulatory Visit | Attending: Family Medicine | Admitting: Family Medicine

## 2017-07-28 ENCOUNTER — Other Ambulatory Visit: Payer: Self-pay | Admitting: Family Medicine

## 2017-07-28 DIAGNOSIS — R05 Cough: Secondary | ICD-10-CM

## 2017-07-28 DIAGNOSIS — R059 Cough, unspecified: Secondary | ICD-10-CM

## 2017-08-02 ENCOUNTER — Other Ambulatory Visit: Payer: Self-pay | Admitting: Family Medicine

## 2017-08-02 DIAGNOSIS — I10 Essential (primary) hypertension: Secondary | ICD-10-CM

## 2017-08-03 NOTE — Telephone Encounter (Signed)
Patient needs appt first/thx dmf

## 2017-08-04 ENCOUNTER — Other Ambulatory Visit: Payer: Self-pay | Admitting: Internal Medicine

## 2017-08-04 ENCOUNTER — Other Ambulatory Visit: Payer: Self-pay | Admitting: Family Medicine

## 2017-08-04 DIAGNOSIS — I10 Essential (primary) hypertension: Secondary | ICD-10-CM

## 2017-08-05 ENCOUNTER — Telehealth: Payer: Self-pay | Admitting: Interventional Cardiology

## 2017-08-05 MED ORDER — METOPROLOL SUCCINATE ER 25 MG PO TB24: 25.0000 mg | ORAL_TABLET | Freq: Every day | ORAL | 3 refills | Status: DC

## 2017-08-05 MED ORDER — LOSARTAN POTASSIUM 100 MG PO TABS: 100.0000 mg | ORAL_TABLET | Freq: Every day | ORAL | 3 refills | Status: DC

## 2017-08-05 NOTE — Telephone Encounter (Signed)
New Message    Pt c/o BP issue: STAT if pt c/o blurred vision, one-sided weakness or slurred speech  1. What are your last 5 BP readings?170/81 92, 180/96 96 , pulse has been up to 101  , had 4 events of irregular heart beat since September   2. Are you having any other symptoms (ex. Dizziness, headache, blurred vision, passed out)?  Dizziness off and on  3. What is your BP issue?  Concerned with the bp being high and the high heart rate, Dr Tamala Julian cut her medication in half and this started happening

## 2017-08-05 NOTE — Telephone Encounter (Signed)
Spoke with pt and PMd recently switched Diovan to Losartan 100 mg .Discussed with Truitt Merle NP have pt increase Metoprolol to 25 mg every day  and needs follow up Appt made for 08-10-17 at 2:30 pm .Adonis Housekeeper

## 2017-08-10 ENCOUNTER — Encounter: Payer: Self-pay | Admitting: Nurse Practitioner

## 2017-08-10 ENCOUNTER — Ambulatory Visit (INDEPENDENT_AMBULATORY_CARE_PROVIDER_SITE_OTHER): Payer: Medicare Other | Admitting: Nurse Practitioner

## 2017-08-10 VITALS — BP 146/76 | Ht 65.5 in | Wt 160.8 lb

## 2017-08-10 DIAGNOSIS — R002 Palpitations: Secondary | ICD-10-CM

## 2017-08-10 MED ORDER — METOPROLOL SUCCINATE ER 50 MG PO TB24: 50.0000 mg | ORAL_TABLET | Freq: Every day | ORAL | 3 refills | Status: DC

## 2017-08-10 NOTE — Patient Instructions (Addendum)
We will be checking the following labs today - NONE   Medication Instructions:    Continue with your current medicines. BUT  I am increasing the Toprol to 50 mg a day - you can take 2 of your 25 mg tablets and use up - the RX for the 50 mg is at the drug store.     Testing/Procedures To Be Arranged:  N/A   Other Special Instructions:   Limit your caffeine use.     If you need a refill on your cardiac medications before your next appointment, please call your pharmacy.   Call the [place] office at [phone] if you have any questions, problems or concerns.

## 2017-08-10 NOTE — Progress Notes (Signed)
CARDIOLOGY OFFICE NOTE  Date:  08/10/2017    Cynthia Gilmore  Date of Birth: 09-Jan-1934 Medical Record #[id number]  PCP:  Alroy Dust, L.Marlou Sa, MD  Cardiologist:  Tamala Julian  Chief Complaint  Patient presents with  . Palpitations  . Hypertension    Work in visit - seen for Dr. Tamala Julian    History of Present Illness: Cynthia Gilmore is a 83 y.o. female who presents today for a work in visit. Seen for Dr. Tamala Julian.   She has a h/o chronic tachypalpitations, systolic murmur, hypertension, and h/o prior TIA. Other issues include anxiety and depression.   Seen back in April by Dr. Tamala Julian for intermittent subxiphoid chest discomfort, pounding and irregular heartbeat. The patient saw Dr. Wynonia Lawman last year and had had a workup for very similar complaints. Stress testing was arranged as well as an event monitor by Dr. Tamala Julian - see below. Her complaints of palpitations did not correlate with the monitor's results. She was treated with low dose metoprolol.   Comes in today. Here with her husband. She is here because of palpitations - where it used to "flutter" now it "skips". She is on metoprolol. Wondering if she needs to be on a higher dose. She has had bronchitis a year ago - still with a cough - unclear as to why. Tries to walk about twice a day. No actual chest pain. Lots of sweating - unclear as to why - says she has not told Dr. Alroy Dust about this. She remains tired. She is anxious. She is depressed. She has been dizzy. BP a little labile but on the higher side for the most part. HR in the upper 90's. Her cuff is about 10 points higher than here. She has had her ARB switched due to the recall and is now on Losartan. BP does not seem to be that well controlled on Losartan. Multiple allergies/intolerances noted. Has had recent labs with PCP - was told "everything was ok". I am not able to see those results. Tells me she feels just like she did back in April when she saw Dr. Tamala Julian.   Past Medical  History:  Diagnosis Date  . Allergy   . Anxiety   . Anxiety and depression    ? bipolar  . Cataract    bil cataracts removed  . Depression   . Diabetes mellitus, type 2 ([place])   . GERD (gastroesophageal reflux disease)   . Heart murmur   . Hemorrhoids 2012   . Hiatal hernia   . Hypertension   . IBS (irritable bowel syndrome)   . Lymphocytic colitis 2012  . Mumerous medication allergies and sensitivities 07/20/2015  . Osteoarthritis (arthritis due to wear and tear of joints)   . Segmental colitis ([place])    iscemic colitis 2009  . Stroke [facility])    states 2 "'mini strokes" 1980  . TIA (transient ischemic attack) 2000   x 3     Past Surgical History:  Procedure Laterality Date  . ANKLE SURGERY  1999   right ankle fracture surgery  . BLADDER REPAIR  1995  . CATARACT EXTRACTION, BILATERAL    . CHOLECYSTECTOMY  2004  . COLONOSCOPY W/ BIOPSIES AND POLYPECTOMY   07/29/2011   internal hemorrhoids, lymphocytic colitis  . ESOPHAGEAL MANOMETRY     Normal  . ESOPHAGOGASTRODUODENOSCOPY  07/29/2011   gastritis,hiatal hernia, tortuous esophagus  . EUS  06/03/2012   Procedure: UPPER ENDOSCOPIC ULTRASOUND (EUS) LINEAR;  Surgeon: Milus Banister,  MD;  Location: [facility];  Service: Endoscopy;  Laterality: N/A;  . HERNIA REPAIR  1995  . PARTIAL HYSTERECTOMY  1992   partial     Medications: Current Meds  Medication Sig  . ALPRAZolam (XANAX) 0.25 MG tablet TAKE 1/2 TO 1 TABLET BY MOUTH ONCE DAILY AS NEEDED  . amitriptyline (ELAVIL) 50 MG tablet take one tablet daily  . anti-nausea (EMETROL) solution Take 5 mLs by mouth as needed for nausea or vomiting.  . Ascorbic Acid (VITAMIN C) 500 MG CAPS Take by mouth.  . chlorpheniramine-HYDROcodone (TUSSIONEX PENNKINETIC ER) 10-8 MG/5ML SUER Take 5 mLs by mouth at bedtime as needed for cough.  Marland Kitchen glucose blood (ACCU-CHEK SMARTVIEW) test strip 1 each by Other route daily.  . hyoscyamine (ANASPAZ) 0.125 MG TBDP disintergrating tablet Place 1 tablet  (0.125 mg total) under the tongue every 4 (four) hours as needed for cramping.  Marland Kitchen ibuprofen (ADVIL,MOTRIN) 200 MG tablet Take 200 mg by mouth every 6 (six) hours as needed.  Marland Kitchen losartan (COZAAR) 100 MG tablet Take 1 tablet (100 mg total) by mouth daily.  . metFORMIN (GLUCOPHAGE-XR) 500 MG 24 hr tablet TAKE 1 TABLET(500 MG) BY MOUTH TWICE DAILY WITH A MEAL  . Misc Natural Products (ESTROVEN ENERGY PO) Take by mouth.  . ondansetron (ZOFRAN) 4 MG tablet TAKE 1 TABLET BY MOUTH FOUR TIMES DAILY( BEFORE A MEAL AND EVERY NIGHT AT BEDTIME)  . ranitidine (ZANTAC) 150 MG tablet TAKE 1 TABLET(150 MG) BY MOUTH DAILY  . spironolactone (ALDACTONE) 25 MG tablet Take 1 tablet (25 mg total) by mouth daily.  . TRINTELLIX 20 MG TABS Take 20 mg by mouth daily.  . [DISCONTINUED] metoprolol succinate (TOPROL XL) 25 MG 24 hr tablet Take 1 tablet (25 mg total) by mouth daily.     Allergies: Allergies  Allergen Reactions  . Alprazolam     REACTION: sleepy  . Amlodipine Besylate     REACTION: swelling  . Aripiprazole   . Augmentin [Amoxicillin-Pot Clavulanate] Diarrhea  . Azithromycin   . Bupropion Hcl     REACTION: insomnia  . Cefaclor     REACTION: mouth blisters  . Celecoxib     REACTION: "drained"  . Cephalexin   . Ciprofloxacin   . Clonidine Hydrochloride     REACTION: headache, dizziness  . Desvenlafaxine   . Diphenoxylate-Atropine   . Divalproex Sodium     REACTION: nausea,tremors,fatigue  . Doxycycline   . Entex     REACTION: nausea, headache  . Est Estrogens-Methyltest     REACTION: headache  . Fluoxetine Hcl   . Furosemide   . Hydrochlorothiazide   . Hydrocodone-Acetaminophen     REACTION: headache  . Lamotrigine     REACTION: mood swing,chills, nausea  . Lansoprazole   . Lisinopril     REACTION: cough  . Lorazepam   . Meloxicam     REACTION: nausea, diarrhea  . Methylphenidate Hcl   . Metronidazole   . Mirtazapine     REACTION: swelling  . Nabumetone     REACTION: nausea,  headache  . Nitrofuran Derivatives Nausea Only  . Olmesartan Medoxomil   . Other     Alum and Mag Hydroxide Simeth(Solution) GI Cocktail. Diarrhea   . Oxycodone-Acetaminophen   . Pantoprazole Sodium   . Paroxetine     REACTION: tremor, insomnia, nausea  . Quetiapine   . Rabeprazole Sodium     REACTION: diarrhea  . Rofecoxib   . Sertraline Hcl     REACTION:  exreme confusion  . Sumatriptan     REACTION: cardiologist says "no"  . Telithromycin     REACTION: "drained"  . Topamax     Confusion and mood swings  . Tramadol-Acetaminophen   . Venlafaxine     REACTION: hypertension, nausea, headache  . Verapamil     Headaches and hot flashes   . Vilazodone Hcl [Vilazodone Hcl]     Social History: The patient  reports that she has quit smoking. Her smoking use included Cigarettes. She has never used smokeless tobacco. She reports that she does not drink alcohol or use drugs.   Family History: The patient's family history includes Diabetes in her maternal aunt, maternal uncle, and mother; Heart disease in her father and paternal aunt.   Review of Systems: Please see the history of present illness.   Otherwise, the review of systems is positive for none.   All other systems are reviewed and negative.   Physical Exam: VS:  BP (!) 146/76 (BP Location: Left Arm, Patient Position: Sitting, Cuff Size: Normal)   Ht 5' 5.5" (1.664 m)   Wt 160 lb 12.8 oz (72.9 kg)   BMI 26.35 kg/m  .  BMI Body mass index is 26.35 kg/m.  Wt Readings from Last 3 Encounters:  08/10/17 160 lb 12.8 oz (72.9 kg)  03/17/17 158 lb (71.7 kg)  03/03/17 158 lb 6.4 oz (71.8 kg)    General: Pleasant. She is alert and in no acute distress.   HEENT: Normal.  Neck: Supple, no JVD, carotid bruits, or masses noted.  Cardiac: Regular rate and rhythm. Soft murmur noted.  No edema.  Respiratory:  Lungs are clear to auscultation bilaterally with normal work of breathing.  GI: Soft and nontender.  MS: No deformity or  atrophy. Gait and ROM intact.  Skin: Warm and dry. Color is normal.  Neuro:  Strength and sensation are intact and no gross focal deficits noted.  Psych: Alert, appropriate and with normal affect.   LABORATORY DATA:  EKG:  EKG is ordered today. This demonstrates NSR - low voltage - unchanged from prior tracing - reviewed with Dr. Tamala Julian here in the office.  Lab Results  Component Value Date   WBC 7.8 12/05/2016   HGB 13.4 12/05/2016   HCT 40.2 12/05/2016   PLT 225 12/05/2016   GLUCOSE 106 (H) 03/24/2017   CHOL 196 03/24/2017   TRIG 209.0 (H) 03/24/2017   HDL 49.40 03/24/2017   LDLDIRECT 124.0 03/24/2017   LDLCALC 98 12/19/2016   ALT 17 03/24/2017   AST 19 03/24/2017   NA 139 03/24/2017   K 4.3 03/24/2017   CL 102 03/24/2017   CREATININE 0.65 03/24/2017   BUN 14 03/24/2017   CO2 29 03/24/2017   TSH 1.59 12/07/2014   INR 0.9 10/11/2007   HGBA1C 6.2 03/24/2017   MICROALBUR <0.7 06/06/2016     BNP (last 3 results) No results for input(s): BNP in the last 8760 hours.  ProBNP (last 3 results) No results for input(s): PROBNP in the last 8760 hours.   Other Studies Reviewed Today: CHEST  2 VIEW  COMPARISON:  12/05/2016  FINDINGS: The heart size and mediastinal contours are within normal limits. Both lungs are clear. The visualized skeletal structures are unremarkable.  IMPRESSION: No active cardiopulmonary disease.   Electronically Signed   By: Kathreen Devoid   On: 07/28/2017 13:51  Holter Study Highlights 02/2017    The underlying basic rhythm is normal sinus rhythm  No sustained arrhythmias, either  tachycardia or bradycardia were noted.  Multiple events of "palpitation" and "flutter ", were reported but do not correlate with significant arrhythmia.  On one occasion "palpitation" was associated with ventricular bigeminy   Normal sinus rhythm Poor correlation between complaints of palpitations and flutter with heart rhythm disturbance. On occasion  out of multiple recorded events was associated with brief ventricular bigeminy. Overall, this is a normal study.   Myoview Study Highlights 02/2017    Nuclear stress EF: 81%.  Blood pressure demonstrated a normal response to exercise.  There was no ST segment deviation noted during stress.  Defect 1: There is a small defect of moderate severity present in the basal inferior location.  This is a low risk study.  The left ventricular ejection fraction is hyperdynamic (>65%).   Low risk stress nuclear study with mild inferobasal thinning; no significant ischemia (minimal reversibility felt unlikely to be significant); EF 81 with normal wall motion.     Assessment/Plan:  1. Palpitations - ongoing complaint - recent event monitor without significant arrhythmia and did not correlate with symptoms. Will try increasing her Metoprolol. She has BP and HR to support increase. Would favor cutting back on caffeine as well - husband notes excessive use.   2. Recent low risk Myoview  3. HTN - increasing beta blocker today. She has switched ARB due to the recall. Would monitor. Her cuff is about 10 points higher.   4. Anxiety/depression - not addressed today. See Dr. Thompson Caul prior assessment.   5. Cough - would discuss with PCP    Current medicines are reviewed with the patient today.  The patient does not have concerns regarding medicines other than what has been noted above.  The following changes have been made:  See above.  Labs/ tests ordered today include:    Orders Placed This Encounter  Procedures  . EKG 12-Lead     Disposition:   FU as needed.   Patient is agreeable to this plan and will call if any problems develop in the interim.   SignedTruitt Merle, NP  08/10/2017 3:26 PM  [facility] [address] [place], [place]  [address] Phone: [phone] Fax: [phone]

## 2017-10-09 ENCOUNTER — Other Ambulatory Visit: Payer: Self-pay | Admitting: Family Medicine

## 2017-10-09 DIAGNOSIS — E1151 Type 2 diabetes mellitus with diabetic peripheral angiopathy without gangrene: Secondary | ICD-10-CM

## 2017-10-21 ENCOUNTER — Other Ambulatory Visit: Payer: Self-pay | Admitting: *Deleted

## 2017-10-21 DIAGNOSIS — E1151 Type 2 diabetes mellitus with diabetic peripheral angiopathy without gangrene: Secondary | ICD-10-CM

## 2017-10-21 MED ORDER — METFORMIN HCL ER 500 MG PO TB24: ORAL_TABLET | ORAL | 0 refills | Status: DC

## 2017-12-09 ENCOUNTER — Other Ambulatory Visit: Payer: Self-pay | Admitting: Internal Medicine

## 2017-12-14 ENCOUNTER — Telehealth: Payer: Self-pay | Admitting: Interventional Cardiology

## 2017-12-14 NOTE — Telephone Encounter (Signed)
This information suggests atrial fibrillation.  She needs to have an EKG and be seen as soon as possible this week

## 2017-12-14 NOTE — Telephone Encounter (Signed)
New message  irrigular heart beat 102 96 101 103 111 amatripolene  Patient c/o Palpitations:  High priority if patient c/o lightheadedness, shortness of breath, or chest pain  1) How long have you had palpitations/irregular HR/ Afib? Are you having the symptoms now? Irregular heart beat On and off since prescribed Amitriptyline by primary  2) Are you currently experiencing lightheadedness, SOB or CP? No  3) Do you have a history of afib (atrial fibrillation) or irregular heart rhythm? No  4) Have you checked your BP or HR? (document readings if available): 111 this morning bp: 124/68  5) Are you experiencing any other symptoms? No

## 2017-12-14 NOTE — Telephone Encounter (Signed)
Pt states PCP started her on Amitriptyline back in Sept and since then has been having trouble with palps.  Truitt Merle, NP increased Toprol XL to 50mg  on 08/10/17.  Pt states this did not help.  On 1/13 pt was cooking and checked vitals, NP 168/126, HR 120.  Pt checked vitals this morning x 2 as follows: 9A- BP 124/68, HR 111 irregular 10:45A- BP 134/96, HR 99 irregular  Pt states her BP monitor will list if HR is regular or irregular.  Pt denies any lightheadedness, dizziness, CP, SOB or swelling.  Advised I will send message to Dr. Tamala Julian for review and advisement.

## 2017-12-15 ENCOUNTER — Ambulatory Visit: Payer: Medicare Other | Admitting: Nurse Practitioner

## 2017-12-15 ENCOUNTER — Encounter: Payer: Self-pay | Admitting: Nurse Practitioner

## 2017-12-15 ENCOUNTER — Encounter (INDEPENDENT_AMBULATORY_CARE_PROVIDER_SITE_OTHER): Payer: Self-pay

## 2017-12-15 VITALS — BP 128/70 | HR 100 | Ht 65.5 in | Wt 162.8 lb

## 2017-12-15 DIAGNOSIS — R002 Palpitations: Secondary | ICD-10-CM

## 2017-12-15 NOTE — Patient Instructions (Signed)
We will be checking the following labs today - BMET, CBC and TSH   Medication Instructions:    Continue with your current medicines.     Testing/Procedures To Be Arranged:  N/A  Follow-Up:   See [place] back as needed    Other Special Instructions:   N/A    If you need a refill on your cardiac medications before your next appointment, please call your pharmacy.   Call the [place] office at [phone] if you have any questions, problems or concerns.

## 2017-12-15 NOTE — Telephone Encounter (Signed)
Spoke with pt and scheduled her to see Truitt Merle, NP today at 2pm.  Pt verbalized understanding and was in agreement with this plan.

## 2017-12-15 NOTE — Progress Notes (Signed)
CARDIOLOGY OFFICE NOTE  Date:  12/15/2017    Cynthia Gilmore Date of Birth: 03-18-1934 Medical Record #[id number]  PCP:  Alroy Dust, L.Marlou Sa, MD  Cardiologist:  Jennings Books    Chief Complaint  Patient presents with  . Atrial Fibrillation    Work in visit - seen for Dr. Tamala Julian    History of Present Illness: Cynthia Gilmore is a 84 y.o. female who presents today for a work in visit. Seen for Dr. Tamala Julian.   She has a h/o chronic tachypalpitations, systolic murmur, hypertension, and h/o prior TIA. Other issues include anxiety and depression.   Seen back in April by Dr. Tamala Julian for intermittent subxiphoid chest discomfort, pounding and irregular heartbeat. The patient saw Dr. Wynonia Lawman last year and had had a workup for very similar complaints. Stress testing was arranged as well as an event monitor by Dr. Tamala Julian - see below. Her complaints of palpitations did not correlate with the monitor's results. She was treated with low dose metoprolol.   I saw her back in September for palpitations. She has multiple allergies noted. Chronic sweating. HR in the upper 90's at that time.   Phone call earlier today - Pt states her BP monitor will list if HR is regular or irregular.  Pt denies any lightheadedness, dizziness, CP, SOB or swelling.  Advised I will send message to Dr. Tamala Julian for review and advisement.    Thus added to my schedule today due to concern for possible AF.   Comes in today. Here with her husband and daughter. She had been doing ok for the most part. She has issues with nervousness and anxiety that lead to GI upset. BP and HR for the most part has been doing ok at home. Her readings are reviewed. Today while walking her dog - she felt like her heart was fluttering - she went back home - BP was 148/71 with HR 119. She got anxious and then called here. She was very anxious about coming here. She basically however feels ok. Sounds like she has been on changing doses of Elavil.  Husband thinks she is using too much caffeine. Chronically worried. No chest pain and not short of breath.    Past Medical History:  Diagnosis Date  . Allergy   . Anxiety   . Anxiety and depression    ? bipolar  . Cataract    bil cataracts removed  . Depression   . Diabetes mellitus, type 2 ([place])   . GERD (gastroesophageal reflux disease)   . Heart murmur   . Hemorrhoids 2012   . Hiatal hernia   . Hypertension   . IBS (irritable bowel syndrome)   . Lymphocytic colitis 2012  . Mumerous medication allergies and sensitivities 07/20/2015  . Osteoarthritis (arthritis due to wear and tear of joints)   . Segmental colitis ([place])    iscemic colitis 2009  . Stroke [facility])    states 2 "'mini strokes" 1980  . TIA (transient ischemic attack) 2000   x 3     Past Surgical History:  Procedure Laterality Date  . ANKLE SURGERY  1999   right ankle fracture surgery  . BLADDER REPAIR  1995  . CATARACT EXTRACTION, BILATERAL    . CHOLECYSTECTOMY  2004  . COLONOSCOPY W/ BIOPSIES AND POLYPECTOMY   07/29/2011   internal hemorrhoids, lymphocytic colitis  . ESOPHAGEAL MANOMETRY     Normal  . ESOPHAGOGASTRODUODENOSCOPY  07/29/2011   gastritis,hiatal hernia, tortuous esophagus  .  EUS  06/03/2012   Procedure: UPPER ENDOSCOPIC ULTRASOUND (EUS) LINEAR;  Surgeon: Milus Banister, MD;  Location: [facility] ENDOSCOPY;  Service: Endoscopy;  Laterality: N/A;  . HERNIA REPAIR  1995  . PARTIAL HYSTERECTOMY  1992   partial     Medications: Current Meds  Medication Sig  . ALPRAZolam (XANAX) 0.25 MG tablet TAKE 1/2 TO 1 TABLET BY MOUTH ONCE DAILY AS NEEDED  . amitriptyline (ELAVIL) 100 MG tablet Take 100 mg by mouth at bedtime.  Marland Kitchen anti-nausea (EMETROL) solution Take 5 mLs by mouth as needed for nausea or vomiting.  . Ascorbic Acid (VITAMIN C) 500 MG CAPS Take by mouth.  Marland Kitchen glucose blood (ACCU-CHEK SMARTVIEW) test strip 1 each by Other route daily.  . hyoscyamine (ANASPAZ) 0.125 MG TBDP disintergrating tablet Place 1  tablet (0.125 mg total) under the tongue every 4 (four) hours as needed for cramping.  Marland Kitchen ibuprofen (ADVIL,MOTRIN) 200 MG tablet Take 200 mg by mouth every 6 (six) hours as needed.  . metFORMIN (GLUCOPHAGE-XR) 500 MG 24 hr tablet TAKE 1 TABLET(500 MG) BY MOUTH TWICE DAILY WITH A MEAL  . metoprolol succinate (TOPROL-XL) 50 MG 24 hr tablet Take 1 tablet (50 mg total) by mouth daily. Take with or immediately following a meal.  . Misc Natural Products (ESTROVEN ENERGY PO) Take by mouth.  . ondansetron (ZOFRAN) 4 MG tablet TAKE 1 TABLET BY MOUTH FOUR TIMES DAILY( BEFORE A MEAL AND EVERY NIGHT AT BEDTIME)  . ranitidine (ZANTAC) 150 MG tablet TAKE 1 TABLET BY MOUTH DAILY  . spironolactone (ALDACTONE) 25 MG tablet Take 1 tablet (25 mg total) by mouth daily.  . TRINTELLIX 20 MG TABS Take 20 mg by mouth daily.     Allergies: Allergies  Allergen Reactions  . Alprazolam     REACTION: sleepy  . Amlodipine Besylate     REACTION: swelling  . Aripiprazole   . Augmentin [Amoxicillin-Pot Clavulanate] Diarrhea  . Azithromycin   . Bupropion Hcl     REACTION: insomnia  . Cefaclor     REACTION: mouth blisters  . Celecoxib     REACTION: "drained"  . Cephalexin   . Ciprofloxacin   . Clonidine Hydrochloride     REACTION: headache, dizziness  . Desvenlafaxine   . Diphenoxylate-Atropine   . Divalproex Sodium     REACTION: nausea,tremors,fatigue  . Doxycycline   . Entex     REACTION: nausea, headache  . Est Estrogens-Methyltest     REACTION: headache  . Fluoxetine Hcl   . Furosemide   . Hydrochlorothiazide   . Hydrocodone-Acetaminophen     REACTION: headache  . Lamotrigine     REACTION: mood swing,chills, nausea  . Lansoprazole   . Lisinopril     REACTION: cough  . Lorazepam   . Meloxicam     REACTION: nausea, diarrhea  . Methylphenidate Hcl   . Metronidazole   . Mirtazapine     REACTION: swelling  . Nabumetone     REACTION: nausea, headache  . Nitrofuran Derivatives Nausea Only  .  Olmesartan Medoxomil   . Other     Alum and Mag Hydroxide Simeth(Solution) GI Cocktail. Diarrhea   . Oxycodone-Acetaminophen   . Pantoprazole Sodium   . Paroxetine     REACTION: tremor, insomnia, nausea  . Quetiapine   . Rabeprazole Sodium     REACTION: diarrhea  . Rofecoxib   . Sertraline Hcl     REACTION: exreme confusion  . Sumatriptan     REACTION: cardiologist says "no"  .  Telithromycin     REACTION: "drained"  . Topamax     Confusion and mood swings  . Tramadol-Acetaminophen   . Venlafaxine     REACTION: hypertension, nausea, headache  . Verapamil     Headaches and hot flashes   . Vilazodone Hcl [Vilazodone Hcl]     Social History: The patient  reports that she has quit smoking. Her smoking use included cigarettes. she has never used smokeless tobacco. She reports that she does not drink alcohol or use drugs.   Family History: The patient's family history includes Diabetes in her maternal aunt, maternal uncle, and mother; Heart disease in her father and paternal aunt.   Review of Systems: Please see the history of present illness.   Otherwise, the review of systems is positive for none.   All other systems are reviewed and negative.   Physical Exam: VS:  BP 128/70 (BP Location: Left Arm, Patient Position: Sitting, Cuff Size: Normal)   Pulse 100   Ht 5' 5.5" (1.664 m)   Wt 162 lb 12.8 oz (73.8 kg)   BMI 26.68 kg/m  .  BMI Body mass index is 26.68 kg/m.  Wt Readings from Last 3 Encounters:  12/15/17 162 lb 12.8 oz (73.8 kg)  08/10/17 160 lb 12.8 oz (72.9 kg)  03/17/17 158 lb (71.7 kg)    General: Elderly. Anxious. Alert and in no acute distress.   HEENT: Normal.  Neck: Supple, no JVD, carotid bruits, or masses noted.  Cardiac: Regular rate and rhythm. Apical heart rate was 100 on 2 different checks by myself personally. No edema.  Respiratory:  Lungs are clear to auscultation bilaterally with normal work of breathing.  GI: Soft and nontender.  MS: No  deformity or atrophy. Gait and ROM intact.  Skin: Warm and dry. Color is normal.  Neuro:  Strength and sensation are intact and no gross focal deficits noted.  Psych: Alert, appropriate and with normal affect.   LABORATORY DATA:  EKG:  EKG is ordered today. This demonstrates sinus tach - HR is 100 - reviewed with Dr. Lovena Le (DOD) here in the office today - there is no AF noted.  Lab Results  Component Value Date   WBC 7.8 12/05/2016   HGB 13.4 12/05/2016   HCT 40.2 12/05/2016   PLT 225 12/05/2016   GLUCOSE 106 (H) 03/24/2017   CHOL 196 03/24/2017   TRIG 209.0 (H) 03/24/2017   HDL 49.40 03/24/2017   LDLDIRECT 124.0 03/24/2017   LDLCALC 98 12/19/2016   ALT 17 03/24/2017   AST 19 03/24/2017   NA 139 03/24/2017   K 4.3 03/24/2017   CL 102 03/24/2017   CREATININE 0.65 03/24/2017   BUN 14 03/24/2017   CO2 29 03/24/2017   TSH 1.59 12/07/2014   INR 0.9 10/11/2007   HGBA1C 6.2 03/24/2017   MICROALBUR <0.7 06/06/2016     BNP (last 3 results) No results for input(s): BNP in the last 8760 hours.  ProBNP (last 3 results) No results for input(s): PROBNP in the last 8760 hours.   Other Studies Reviewed Today:  CHEST 2 VIEW  COMPARISON: 12/05/2016  FINDINGS: The heart size and mediastinal contours are within normal limits. Both lungs are clear. The visualized skeletal structures are unremarkable.  IMPRESSION: No active cardiopulmonary disease.   Electronically Signed By: Kathreen Devoid On: 07/28/2017 13:51  Holter Study Highlights 02/2017    The underlying basic rhythm is normal sinus rhythm  No sustained arrhythmias, either tachycardia or bradycardia were noted.  Multiple events of "palpitation" and "flutter ", were reported but do not correlate with significant arrhythmia.  On one occasion "palpitation" was associated with ventricular bigeminy  Normal sinus rhythm Poor correlation between complaints of palpitations and flutter with heart  rhythm disturbance. On occasion out of multiple recorded events was associated with brief ventricular bigeminy. Overall, this is a normal study.   Myoview Study Highlights 02/2017    Nuclear stress EF: 81%.  Blood pressure demonstrated a normal response to exercise.  There was no ST segment deviation noted during stress.  Defect 1: There is a small defect of moderate severity present in the basal inferior location.  This is a low risk study.  The left ventricular ejection fraction is hyperdynamic (>65%).  Low risk stress nuclear study with mild inferobasal thinning; no significant ischemia (minimal reversibility felt unlikely to be significant); EF 81 with normal wall motion.     Assessment/Plan:  1. Palpitations - recurrent issue - her prior event monitor without significant arrhythmia and did not correlate with symptoms. She continues to have excessive caffeine use. Discussed possibly increasing her beta blocker but I favor her trying to embrace some lifestyle changes. Baseline lab today. If symptoms persist consider further evaluation ?ILR and/or trying to increase her beta blocker. Her anxieties seem to be driving a lot of her issues.   2. Prior low risk Myoview - she is not having any chest pain.   3. HTN - BP is fine here today. No changes made  4. Anxiety/depression -  See Dr. Thompson Caul prior assessment. She has had changing doses of Elavil - defer to PCP.   Current medicines are reviewed with the patient today.  The patient does not have concerns regarding medicines other than what has been noted above.  The following changes have been made:  See above.  Labs/ tests ordered today include:    Orders Placed This Encounter  Procedures  . Basic metabolic panel  . CBC  . TSH  . EKG 12-Lead     Disposition:   FU as needed.   Patient is agreeable to this plan and will call if any problems develop in the interim.   SignedTruitt Merle, NP  12/15/2017  2:39 PM  [facility] [address] [place], [place]  [address] Phone: [phone] Fax: [phone]

## 2017-12-16 LAB — BASIC METABOLIC PANEL
BUN/Creatinine Ratio: 17 (ref 12–28)
BUN: 15 mg/dL (ref 8–27)
CO2: 22 mmol/L (ref 20–29)
Calcium: 9.9 mg/dL (ref 8.7–10.3)
Chloride: 99 mmol/L (ref 96–106)
Creatinine, Ser: 0.86 mg/dL (ref 0.57–1.00)
GFR calc Af Amer: 73 mL/min/{1.73_m2} (ref >59)
GFR calc non Af Amer: 64 mL/min/{1.73_m2} (ref >59)
Glucose: 114 mg/dL — ABNORMAL HIGH (ref 65–99)
Potassium: 5 mmol/L (ref 3.5–5.2)
Sodium: 139 mmol/L (ref 134–144)

## 2017-12-16 LAB — TSH: TSH: 1.23 u[IU]/mL (ref 0.450–4.500)

## 2017-12-16 LAB — CBC
Hematocrit: 40.9 % (ref 34.0–46.6)
Hemoglobin: 13.8 g/dL (ref 11.1–15.9)
MCH: 29.1 pg (ref 26.6–33.0)
MCHC: 33.7 g/dL (ref 31.5–35.7)
MCV: 86 fL (ref 79–97)
Platelets: 359 10*3/uL (ref 150–379)
RBC: 4.74 x10E6/uL (ref 3.77–5.28)
RDW: 13.5 % (ref 12.3–15.4)
WBC: 11.7 10*3/uL — ABNORMAL HIGH (ref 3.4–10.8)

## 2018-01-01 ENCOUNTER — Telehealth: Payer: Self-pay | Admitting: Interventional Cardiology

## 2018-01-01 NOTE — Telephone Encounter (Signed)
Called pt to clarify how she was taking Metoprolol 50 mg tablet. Pt stated that her other doctor increased her metoprolol to 75 mg tablet taken daily. I informed the pt that she would have to get the other doctor that increased the medication to refill this medication for 75 mg tablet, since it was increased by him. Pt stated that she would give them a call. I advised the pt that if she needed to give our office a call back to do so and I would sent Truitt Merle, NP, a message asking her about this change. Pt verbalized understanding. FYI

## 2018-01-01 NOTE — Telephone Encounter (Signed)
New message   Patient states she is taking 75mg   *STAT* If patient is at the pharmacy, call can be transferred to refill team.   1. Which medications need to be refilled? (please list name of each medication and dose if known) metoprolol succinate (TOPROL-XL) 75mg   2. Which pharmacy/location (including street and city if local pharmacy) is medication to be sent to?[facility] [place]  3. Do they need a 30 day or 90 day supply? [place]

## 2018-01-17 ENCOUNTER — Encounter (HOSPITAL_COMMUNITY): Payer: Self-pay

## 2018-01-17 ENCOUNTER — Emergency Department (HOSPITAL_COMMUNITY)
Admission: EM | Admit: 2018-01-17 | Discharge: 2018-01-17 | Disposition: A | Discharge status: Home / Self Care | Payer: Medicare Other | Attending: Emergency Medicine | Admitting: Emergency Medicine

## 2018-01-17 DIAGNOSIS — R002 Palpitations: Secondary | ICD-10-CM | POA: Diagnosis present

## 2018-01-17 DIAGNOSIS — R110 Nausea: Secondary | ICD-10-CM | POA: Diagnosis not present

## 2018-01-17 DIAGNOSIS — E119 Type 2 diabetes mellitus without complications: Secondary | ICD-10-CM | POA: Diagnosis not present

## 2018-01-17 DIAGNOSIS — I10 Essential (primary) hypertension: Secondary | ICD-10-CM | POA: Insufficient documentation

## 2018-01-17 DIAGNOSIS — Z87891 Personal history of nicotine dependence: Secondary | ICD-10-CM | POA: Diagnosis not present

## 2018-01-17 DIAGNOSIS — Z79899 Other long term (current) drug therapy: Secondary | ICD-10-CM | POA: Diagnosis not present

## 2018-01-17 DIAGNOSIS — Z7984 Long term (current) use of oral hypoglycemic drugs: Secondary | ICD-10-CM | POA: Insufficient documentation

## 2018-01-17 LAB — I-STAT CHEM 8, ED
BUN: 18 mg/dL (ref 6–20)
CALCIUM ION: 1.13 mmol/L — AB (ref 1.15–1.40)
Chloride: 104 mmol/L (ref 101–111)
Creatinine, Ser: 0.6 mg/dL (ref 0.44–1.00)
Glucose, Bld: 91 mg/dL (ref 65–99)
HEMATOCRIT: 42 % (ref 36.0–46.0)
HEMOGLOBIN: 14.3 g/dL (ref 12.0–15.0)
Potassium: 4.6 mmol/L (ref 3.5–5.1)
Sodium: 141 mmol/L (ref 135–145)
TCO2: 25 mmol/L (ref 22–32)

## 2018-01-17 LAB — I-STAT TROPONIN, ED: TROPONIN I, POC: 0.05 ng/mL (ref 0.00–0.08)

## 2018-01-17 MED ORDER — SODIUM CHLORIDE 0.9 % IV BOLUS (SEPSIS)
1000.0000 mL | Freq: Once | INTRAVENOUS | Status: AC
  Administered 2018-01-17: 1000 mL via INTRAVENOUS

## 2018-01-17 NOTE — ED Triage Notes (Signed)
Per [facility], Pt is coming from home with complaints of palpitations. Pt has been feeling the palpitations since this morning at 1000 after breakfast with nausea and GERD. Zantac relieved the GERD, and BP elevated 180/90.  Pt denies CP and SOB. Alert and Oriented x4.  Vitals per EMS: 131/109, 115 HR.   Pt was started on Metoprolol this summer and has had it increased, but her heart rate continues to be elevated.

## 2018-01-17 NOTE — Discharge Instructions (Signed)
Call Dr. Thompson Caul office tomorrow to arrange to be seen in the office this week.  Tell office staff that you were seen in the emergency department.  Return if concern for any reason.

## 2018-01-17 NOTE — ED Provider Notes (Signed)
[facility] EMERGENCY DEPARTMENT Provider Note   CSN: [id number] Arrival date & time: 01/17/18  1516     History   Chief Complaint Chief Complaint  Patient presents with  . Irregular Heart Beat    HPI Cynthia Gilmore is a 84 y.o. female.  Patient reports that she had rapid heartbeat onset 10:30 AM today.  She noted her blood pressure to be 818 systolic and possibly 299.  She took her metoprolol which she takes for palpitations..  Metoprolol was increased from 50 mg daily to 75 mg daily by Dr. Alroy Dust one day last week for same complaint.,  Other associated symptoms include nausea for an hour this morning.  Which she gets "frequently".  She is been having similar palpitations since at least July 2018.  She denies any chest pain shortness of breath or lightheadedness.  She also reports that she has had multiple cardiac monitors which have never shown irregular heartbeat.  She is presently asymptomatic without treatment other than metoprolol which she took at 10:30 AM today.  She patient also reports that she was recently started on amitriptyline which she states she is going to stop on her own, as it makes her feel badly  HPI  Past Medical History:  Diagnosis Date  . Allergy   . Anxiety   . Anxiety and depression    ? bipolar  . Cataract    bil cataracts removed  . Depression   . Diabetes mellitus, type 2 ([place])   . GERD (gastroesophageal reflux disease)   . Heart murmur   . Hemorrhoids 2012   . Hiatal hernia   . Hypertension   . IBS (irritable bowel syndrome)   . Lymphocytic colitis 2012  . Mumerous medication allergies and sensitivities 07/20/2015  . Osteoarthritis (arthritis due to wear and tear of joints)   . Segmental colitis ([place])    iscemic colitis 2009  . Stroke [facility])    states 2 "'mini strokes" 1980  . TIA (transient ischemic attack) 2000   x 3     Patient Active Problem List   Diagnosis Date Noted  . Subvalvular aortic stenosis 03/02/2017    . Low back pain radiating to both legs 06/06/2016  . Sinusitis, acute 11/27/2015  . Numerous medication allergies and sensitivities 07/20/2015  . Carpal tunnel syndrome 05/30/2015  . Dysuria 05/30/2015  . Preventative health care 10/13/2014  . Paroxysmal supraventricular tachycardia ([place]) 11/03/2013  . Palpitations 07/07/2012  . Lipoma stomach 06/03/2012  . Intermittent vertigo 10/08/2011  . GERD (gastroesophageal reflux disease) 06/20/2011  . IBS (irritable bowel syndrome) 06/20/2011  . DEPRESSION/ANXIETY 11/11/2010  . Type II diabetes mellitus with neurological manifestations ([place]) 01/31/2010  . ALLERGIC RHINITIS 09/13/2009  . INSOMNIA, CHRONIC 05/24/2009  . MEMORY LOSS 05/24/2009  . ABDOMINAL PAIN-EPIGASTRIC 01/09/2009  . Lymphocytic colitis 07/19/2008  . PERSONAL HX COLONIC POLYPS 07/19/2008  . Essential hypertension 01/30/2007  . DEGENERATIVE DISC DISEASE 01/30/2007  . CARDIAC MURMUR, AORTIC 01/30/2007    Past Surgical History:  Procedure Laterality Date  . ANKLE SURGERY  1999   right ankle fracture surgery  . BLADDER REPAIR  1995  . CATARACT EXTRACTION, BILATERAL    . CHOLECYSTECTOMY  2004  . COLONOSCOPY W/ BIOPSIES AND POLYPECTOMY   07/29/2011   internal hemorrhoids, lymphocytic colitis  . ESOPHAGEAL MANOMETRY     Normal  . ESOPHAGOGASTRODUODENOSCOPY  07/29/2011   gastritis,hiatal hernia, tortuous esophagus  . EUS  06/03/2012   Procedure: UPPER ENDOSCOPIC ULTRASOUND (EUS) LINEAR;  Surgeon: Milus Banister, MD;  Location: [facility];  Service: Endoscopy;  Laterality: N/A;  . HERNIA REPAIR  1995  . PARTIAL HYSTERECTOMY  1992   partial    OB History    No data available       Home Medications    Prior to Admission medications   Medication Sig Start Date End Date Taking? Authorizing Provider  ALPRAZolam Duanne Moron) 0.25 MG tablet TAKE 1/2 TO 1 TABLET BY MOUTH ONCE DAILY AS NEEDED 03/23/17   Carollee Herter, Alferd Apa, DO  amitriptyline (ELAVIL) 100 MG tablet Take 100  mg by mouth at bedtime.    [provider]  anti-nausea (EMETROL) solution Take 5 mLs by mouth as needed for nausea or vomiting.    [provider]  Ascorbic Acid (VITAMIN C) 500 MG CAPS Take by mouth.    [provider]  chlorpheniramine-HYDROcodone (TUSSIONEX PENNKINETIC ER) 10-8 MG/5ML SUER Take 5 mLs by mouth at bedtime as needed for cough. 12/09/16   Roma Schanz R, DO  glucose blood (ACCU-CHEK SMARTVIEW) test strip 1 each by Other route daily. 10/16/14   Shawna Orleans, Doe-Hyun R, DO  hyoscyamine (ANASPAZ) 0.125 MG TBDP disintergrating tablet Place 1 tablet (0.125 mg total) under the tongue every 4 (four) hours as needed for cramping. 11/18/16   Gatha Mayer, MD  ibuprofen (ADVIL,MOTRIN) 200 MG tablet Take 200 mg by mouth every 6 (six) hours as needed.    [provider]  losartan (COZAAR) 100 MG tablet Take 1 tablet (100 mg total) by mouth daily. 08/05/17 11/03/17  Belva Crome, MD  metFORMIN (GLUCOPHAGE-XR) 500 MG 24 hr tablet TAKE 1 TABLET(500 MG) BY MOUTH TWICE DAILY WITH A MEAL 10/21/17   Roma Schanz R, DO  metoprolol succinate (TOPROL-XL) 50 MG 24 hr tablet Take 1 tablet (50 mg total) by mouth daily. Take with or immediately following a meal. 08/10/17 12/15/17  Burtis Junes, NP  Misc Natural Products (ESTROVEN ENERGY PO) Take by mouth.    [provider]  ondansetron (ZOFRAN) 4 MG tablet TAKE 1 TABLET BY MOUTH FOUR TIMES DAILY( BEFORE A MEAL AND EVERY NIGHT AT BEDTIME) 02/06/17   Gatha Mayer, MD  ranitidine (ZANTAC) 150 MG tablet TAKE 1 TABLET BY MOUTH DAILY 12/09/17   Gatha Mayer, MD  spironolactone (ALDACTONE) 25 MG tablet Take 1 tablet (25 mg total) by mouth daily. 06/06/16   Lowne Chase, Yvonne R, DO  TRINTELLIX 20 MG TABS Take 20 mg by mouth daily. 10/01/16   [provider]    Family History Family History  Problem Relation Age of Onset  . Diabetes Mother   . Heart disease Father   . Diabetes Maternal Aunt    . Diabetes Maternal Uncle   . Heart disease Paternal Aunt   . Colon cancer Neg Hx     Social History Social History   Tobacco Use  . Smoking status: Former Smoker    Types: Cigarettes  . Smokeless tobacco: Never Used  . Tobacco comment: stopped in 1962  Substance Use Topics  . Alcohol use: No    Alcohol/week: 0.0 oz  . Drug use: No     Allergies   Alprazolam; Amlodipine besylate; Aripiprazole; Augmentin [amoxicillin-pot clavulanate]; Azithromycin; Bupropion hcl; Cefaclor; Celecoxib; Cephalexin; Ciprofloxacin; Clonidine hydrochloride; Desvenlafaxine; Diphenoxylate-atropine; Divalproex sodium; Doxycycline; Entex; Est estrogens-methyltest; Fluoxetine hcl; Furosemide; Hydrochlorothiazide; Hydrocodone-acetaminophen; Lamotrigine; Lansoprazole; Lisinopril; Lorazepam; Meloxicam; Methylphenidate hcl; Metronidazole; Mirtazapine; Nabumetone; Nitrofuran derivatives; Olmesartan medoxomil; Other; Oxycodone-acetaminophen; Pantoprazole sodium; Paroxetine; Quetiapine; Rabeprazole  sodium; Rofecoxib; Sertraline hcl; Sumatriptan; Telithromycin; Topamax; Tramadol-acetaminophen; Venlafaxine; Verapamil; and Vilazodone hcl [vilazodone hcl]   Review of Systems Review of Systems  Cardiovascular: Positive for palpitations.  Gastrointestinal: Positive for nausea.  Allergic/Immunologic: Positive for immunocompromised state.       Diabetic  All other systems reviewed and are negative.    Physical Exam Updated Vital Signs BP (!) 147/101 (BP Location: Right Arm)   Pulse (!) 116   Temp 98.5 F (36.9 C) (Oral)   Resp 16   Ht 5' 5.5" (1.664 m)   Wt 70.3 kg (155 lb)   SpO2 97%   BMI 25.40 kg/m   Physical Exam  Constitutional: She appears well-developed and well-nourished.  HENT:  Head: Normocephalic and atraumatic.  Eyes: Conjunctivae are normal. Pupils are equal, round, and reactive to light.  Neck: Neck supple. No tracheal deviation present. No thyromegaly present.  Cardiovascular: Regular  rhythm.  No murmur heard. Mildly tachycardic  Pulmonary/Chest: Effort normal and breath sounds normal.  Abdominal: Soft. Bowel sounds are normal. She exhibits no distension. There is no tenderness.  Musculoskeletal: Normal range of motion. She exhibits no edema or tenderness.  Neurological: She is alert. Coordination normal.  Skin: Skin is warm and dry. No rash noted.  Psychiatric: She has a normal mood and affect.  Nursing note and vitals reviewed.    ED Treatments / Results  Labs (all labs ordered are listed, but only abnormal results are displayed) Labs Reviewed  I-STAT CHEM 8, ED  I-STAT TROPONIN, ED    EKG  EKG Interpretation  Date/Time:  Sunday January 17 2018 15:24:39 EST Ventricular Rate:  113 PR Interval:    QRS Duration: 149 QT Interval:  350 QTC Calculation: 480 R Axis:   59 Text Interpretation:  Sinus tachycardia Right bundle branch block SINCE LAST TRACING HEART RATE HAS INCREASED Confirmed by Orlie Dakin [phone]) on 01/17/2018 3:27:58 PM       Radiology No results found.  Procedures Procedures (including critical care time)  Medications Ordered in ED Medications  sodium chloride 0.9 % bolus 1,000 mL (not administered)   Results for orders placed or performed during the hospital encounter of 01/17/18  I-stat chem 8, ed  Result Value Ref Range   Sodium 141 135 - 145 mmol/L   Potassium 4.6 3.5 - 5.1 mmol/L   Chloride 104 101 - 111 mmol/L   BUN 18 6 - 20 mg/dL   Creatinine, Ser 0.60 0.44 - 1.00 mg/dL   Glucose, Bld 91 65 - 99 mg/dL   Calcium, Ion 1.13 (L) 1.15 - 1.40 mmol/L   TCO2 25 22 - 32 mmol/L   Hemoglobin 14.3 12.0 - 15.0 g/dL   HCT 42.0 36.0 - 46.0 %  I-stat troponin, ED  Result Value Ref Range   Troponin i, poc 0.05 0.00 - 0.08 ng/mL   Comment 3           No results found.  Initial Impression / Assessment and Plan / ED Course  I have reviewed the triage vital signs and the nursing notes.  Pertinent labs & imaging results that  were available during my care of the patient were reviewed by me and considered in my medical decision making (see chart for details).     5:45 PM patient is asymptomatic no longer tachycardic.  Not lightheaded on standing.  We will not make any changes in further medications.  I suggest she follow-up with Dr. Tamala Julian, her cardiologist this week.  As this is a long-standing  problem.  Final Clinical Impressions(s) / ED Diagnoses  Diagnoses #1 palpitations #2 sinus tachycardia Final diagnoses:  None    ED Discharge Orders    None       Orlie Dakin, MD 01/17/18 1750

## 2018-01-18 ENCOUNTER — Telehealth: Payer: Self-pay | Admitting: Interventional Cardiology

## 2018-01-18 ENCOUNTER — Other Ambulatory Visit: Payer: Self-pay | Admitting: Family Medicine

## 2018-01-18 DIAGNOSIS — E1151 Type 2 diabetes mellitus with diabetic peripheral angiopathy without gangrene: Secondary | ICD-10-CM

## 2018-01-18 NOTE — Telephone Encounter (Signed)
New message  Pt verbalized that she is calling for the RN  Pt verbalized that she was at [facility] 01-17-2018

## 2018-01-18 NOTE — Telephone Encounter (Signed)
Pt went to ER on 2/24 for palps.  HR came back down at hospital.  Pt sent home with instructions to contact our office to be seen this week.  Scheduled pt to see Truitt Merle, NP tomorrow since Dr. Tamala Julian not in office this week.  Pt denies any issues with morning.  Pt appreciative for call.

## 2018-01-19 ENCOUNTER — Ambulatory Visit: Payer: Medicare Other | Admitting: Nurse Practitioner

## 2018-01-19 ENCOUNTER — Encounter: Payer: Self-pay | Admitting: Nurse Practitioner

## 2018-01-19 VITALS — BP 160/80 | HR 78 | Ht 65.5 in | Wt 160.8 lb

## 2018-01-19 DIAGNOSIS — I10 Essential (primary) hypertension: Secondary | ICD-10-CM | POA: Diagnosis not present

## 2018-01-19 DIAGNOSIS — R002 Palpitations: Secondary | ICD-10-CM

## 2018-01-19 MED ORDER — METOPROLOL SUCCINATE ER 50 MG PO TB24: 50.0000 mg | ORAL_TABLET | Freq: Two times a day (BID) | ORAL | 3 refills | Status: DC

## 2018-01-19 NOTE — Progress Notes (Signed)
CARDIOLOGY OFFICE NOTE  Date:  01/19/2018    Cynthia Gilmore Date of Birth: 1934-09-19 Medical Record #[id number]  PCP:  Alroy Dust, L.Marlou Sa, MD  Cardiologist:  Jenn[facility]    Chief Complaint  Patient presents with  . Hypertension  . Palpitations    Post ER visit -seen for Dr. Tamala Julian    History of Present Illness: Cynthia Gilmore is a 84 y.o. female who presents today for a post ER visit. Seen for Dr. Tamala Julian.  She has ah/ochronictachypalpitations, systolic murmur, hypertension, and h/o priorTIA.Other issues include anxiety and depression.  Seen back in April of 2018 by Dr. Tamala Julian forintermittent subxiphoid chest discomfort, pounding and irregular heartbeat. The patient saw Dr. Wynonia Lawman in 2017 andhadhad a workup for very similar complaints.Stress testing was arranged as well as an event monitor by Dr. Tamala Julian - see below. Her complaints of palpitations did not correlate with the monitor's results. She was treated with low dose metoprolol.  I then saw her back in September for palpitations. She has multiple allergies noted. Chronic sweating. HR in the upper 90's at that time. Overall, not felt to be really any different from her baseline.   Last seen by me last month as a work in - concern for possible AF - she was in sinus. Lots of anxiety and nervousness. Using tons of caffeine. Chronically worries.   In the ER this past weekend - noted BP was 160 and ? Elevated HR. Metoprolol had already been increased last week by PCP. Asked to follow back up here. Symptoms was basically resolved by the time she got there.    Comes in today. Here withher husband. She admits right off the bat that "you need to fuss at me". She continues with heavy caffeine use. Lots of salt. She has 47 allergies/intolerances. Taking BP very repetiitively. Husband is frustrated. She did wean herself off Elavil. Admits she is short tempered.   Past Medical History:  Diagnosis Date  . Allergy    . Anxiety   . Anxiety and depression    ? bipolar  . Cataract    bil cataracts removed  . Depression   . Diabetes mellitus, type 2 ([place])   . GERD (gastroesophageal reflux disease)   . Heart murmur   . Hemorrhoids 2012   . Hiatal hernia   . Hypertension   . IBS (irritable bowel syndrome)   . Lymphocytic colitis 2012  . Mumerous medication allergies and sensitivities 07/20/2015  . Osteoarthritis (arthritis due to wear and tear of joints)   . Segmental colitis ([place])    iscemic colitis 2009  . Stroke [facility])    states 2 "'mini strokes" 1980  . TIA (transient ischemic attack) 2000   x 3     Past Surgical History:  Procedure Laterality Date  . ANKLE SURGERY  1999   right ankle fracture surgery  . BLADDER REPAIR  1995  . CATARACT EXTRACTION, BILATERAL    . CHOLECYSTECTOMY  2004  . COLONOSCOPY W/ BIOPSIES AND POLYPECTOMY   07/29/2011   internal hemorrhoids, lymphocytic colitis  . ESOPHAGEAL MANOMETRY     Normal  . ESOPHAGOGASTRODUODENOSCOPY  07/29/2011   gastritis,hiatal hernia, tortuous esophagus  . EUS  06/03/2012   Procedure: UPPER ENDOSCOPIC ULTRASOUND (EUS) LINEAR;  Surgeon: Milus Banister, MD;  Location: [facility] ENDOSCOPY;  Service: Endoscopy;  Laterality: N/A;  . HERNIA REPAIR  1995  . PARTIAL HYSTERECTOMY  1992   partial     Medications: Current  Meds  Medication Sig  . ALPRAZolam (XANAX) 0.25 MG tablet TAKE 1/2 TO 1 TABLET BY MOUTH ONCE DAILY AS NEEDED  . anti-nausea (EMETROL) solution Take 5 mLs by mouth as needed for nausea or vomiting.  . Ascorbic Acid (VITAMIN C) 500 MG CAPS Take by mouth.  . chlorpheniramine-HYDROcodone (TUSSIONEX PENNKINETIC ER) 10-8 MG/5ML SUER Take 5 mLs by mouth at bedtime as needed for cough.  Marland Kitchen glucose blood (ACCU-CHEK SMARTVIEW) test strip 1 each by Other route daily.  . hyoscyamine (ANASPAZ) 0.125 MG TBDP disintergrating tablet Place 1 tablet (0.125 mg total) under the tongue every 4 (four) hours as needed for cramping.  Marland Kitchen ibuprofen  (ADVIL,MOTRIN) 200 MG tablet Take 200 mg by mouth every 6 (six) hours as needed.  . metFORMIN (GLUCOPHAGE-XR) 500 MG 24 hr tablet TAKE 1 TABLET(500 MG) BY MOUTH TWICE DAILY WITH A MEAL  . Misc Natural Products (ESTROVEN ENERGY PO) Take by mouth.  . ondansetron (ZOFRAN) 4 MG tablet TAKE 1 TABLET BY MOUTH FOUR TIMES DAILY( BEFORE A MEAL AND EVERY NIGHT AT BEDTIME)  . ranitidine (ZANTAC) 150 MG tablet TAKE 1 TABLET BY MOUTH DAILY  . spironolactone (ALDACTONE) 25 MG tablet Take 1 tablet (25 mg total) by mouth daily.  . [DISCONTINUED] amitriptyline (ELAVIL) 100 MG tablet Take 100 mg by mouth at bedtime.  . [DISCONTINUED] metoprolol succinate (TOPROL-XL) 50 MG 24 hr tablet Take 75 mg by mouth daily.   . [DISCONTINUED] TRINTELLIX 20 MG TABS Take 20 mg by mouth daily.     Allergies: Allergies  Allergen Reactions  . Alprazolam     REACTION: sleepy  . Amlodipine Besylate     REACTION: swelling  . Aripiprazole   . Augmentin [Amoxicillin-Pot Clavulanate] Diarrhea  . Azithromycin   . Bupropion Hcl     REACTION: insomnia  . Cefaclor     REACTION: mouth blisters  . Celecoxib     REACTION: "drained"  . Cephalexin   . Ciprofloxacin   . Clonidine Hydrochloride     REACTION: headache, dizziness  . Desvenlafaxine   . Diphenoxylate-Atropine   . Divalproex Sodium     REACTION: nausea,tremors,fatigue  . Doxycycline   . Entex     REACTION: nausea, headache  . Est Estrogens-Methyltest     REACTION: headache  . Fluoxetine Hcl   . Furosemide   . Hydrochlorothiazide   . Hydrocodone-Acetaminophen     REACTION: headache  . Lamotrigine     REACTION: mood swing,chills, nausea  . Lansoprazole   . Lisinopril     REACTION: cough  . Lorazepam   . Meloxicam     REACTION: nausea, diarrhea  . Methylphenidate Hcl   . Metronidazole   . Mirtazapine     REACTION: swelling  . Nabumetone     REACTION: nausea, headache  . Nitrofuran Derivatives Nausea Only  . Olmesartan Medoxomil   . Other     Alum  and Mag Hydroxide Simeth(Solution) GI Cocktail. Diarrhea   . Oxycodone-Acetaminophen   . Pantoprazole Sodium   . Paroxetine     REACTION: tremor, insomnia, nausea  . Quetiapine   . Rabeprazole Sodium     REACTION: diarrhea  . Rofecoxib   . Sertraline Hcl     REACTION: exreme confusion  . Sumatriptan     REACTION: cardiologist says "no"  . Telithromycin     REACTION: "drained"  . Topamax     Confusion and mood swings  . Tramadol-Acetaminophen   . Venlafaxine     REACTION: hypertension, nausea,  headache  . Verapamil     Headaches and hot flashes   . Vilazodone Hcl [Vilazodone Hcl]     Social History: The patient  reports that she has quit smoking. Her smoking use included cigarettes. she has never used smokeless tobacco. She reports that she does not drink alcohol or use drugs.   Family History: The patient's family history includes Diabetes in her maternal aunt, maternal uncle, and mother; Heart disease in her father and paternal aunt.   Review of Systems: Please see the history of present illness.   Otherwise, the review of systems is positive for none.   All other systems are reviewed and negative.   Physical Exam: VS:  BP (!) 160/80 (BP Location: Left Arm, Patient Position: Sitting, Cuff Size: Normal)   Pulse 78   Ht 5' 5.5" (1.664 m)   Wt 160 lb 12.8 oz (72.9 kg)   SpO2 100% Comment: at rest  BMI 26.35 kg/m  .  BMI Body mass index is 26.35 kg/m.  Wt Readings from Last 3 Encounters:  01/19/18 160 lb 12.8 oz (72.9 kg)  01/17/18 155 lb (70.3 kg)  12/15/17 162 lb 12.8 oz (73.8 kg)    General: Pleasant. Well developed, well nourished and in no acute distress.   HEENT: Normal.  Neck: Supple, no JVD, carotid bruits, or masses noted.  Cardiac: Regular rate and rhythm. No murmurs, rubs, or gallops. No edema.  Respiratory:  Lungs are clear to auscultation bilaterally with normal work of breathing.  GI: Soft and nontender.  MS: No deformity or atrophy. Gait and ROM  intact.  Skin: Warm and dry. Color is normal.  Neuro:  Strength and sensation are intact and no gross focal deficits noted.  Psych: Alert, appropriate and with normal affect.   LABORATORY DATA:  EKG:  EKG is not ordered today.  Lab Results  Component Value Date   WBC 11.7 (H) 12/15/2017   HGB 14.3 01/17/2018   HCT 42.0 01/17/2018   PLT 359 12/15/2017   GLUCOSE 91 01/17/2018   CHOL 196 03/24/2017   TRIG 209.0 (H) 03/24/2017   HDL 49.40 03/24/2017   LDLDIRECT 124.0 03/24/2017   LDLCALC 98 12/19/2016   ALT 17 03/24/2017   AST 19 03/24/2017   NA 141 01/17/2018   K 4.6 01/17/2018   CL 104 01/17/2018   CREATININE 0.60 01/17/2018   BUN 18 01/17/2018   CO2 22 12/15/2017   TSH 1.230 12/15/2017   INR 0.9 10/11/2007   HGBA1C 6.2 03/24/2017   MICROALBUR <0.7 06/06/2016     BNP (last 3 results) No results for input(s): BNP in the last 8760 hours.  ProBNP (last 3 results) No results for input(s): PROBNP in the last 8760 hours.   Other Studies Reviewed Today:  CHEST 2 VIEW  COMPARISON: 12/05/2016  FINDINGS: The heart size and mediastinal contours are within normal limits. Both lungs are clear. The visualized skeletal structures are unremarkable.  IMPRESSION: No active cardiopulmonary disease.   Electronically Signed By: Kathreen Devoid On: 07/28/2017 13:51  HolterStudy Highlights4/2018    The underlying basic rhythm is normal sinus rhythm  No sustained arrhythmias, either tachycardia or bradycardia were noted.  Multiple events of "palpitation" and "flutter ", were reported but do not correlate with significant arrhythmia.  On one occasion "palpitation" was associated with ventricular bigeminy  Normal sinus rhythm Poor correlation between complaints of palpitations and flutter with heart rhythm disturbance. On occasion out of multiple recorded events was associated with brief ventricular bigeminy. Overall,  this is a normal study.    MyoviewStudy Highlights4/2018    Nuclear stress EF: 81%.  Blood pressure demonstrated a normal response to exercise.  There was no ST segment deviation noted during stress.  Defect 1: There is a small defect of moderate severity present in the basal inferior location.  This is a low risk study.  The left ventricular ejection fraction is hyperdynamic (>65%).  Low risk stress nuclear study with mild inferobasal thinning; no significant ischemia (minimal reversibility felt unlikely to be significant); EF 81 with normal wall motion.    Assessment/Plan:  1. Palpitations - recurrent issue - recent ER visit - her prior event monitor without significant arrhythmia and did not correlate with symptoms. She continues to have excessive caffeine use. Have increased the Toprol to 50 mg BID - she does seem to tolerate this. Explained that we have limited options given her issues with medications. ?try CCB but she really needs to work on life style - this has been discussed at previous visits and is reiterated again today.    2. Prior low risk Myoview - she is not having any chest pain.   3. HTN - see above.   4. Anxiety/depression -  See Dr. Thompson Caul prior assessment. This seems to be a driving factor.   Current medicines are reviewed with the patient today.  The patient does not have concerns regarding medicines other than what has been noted above.  The following changes have been made:  See above.  Labs/ tests ordered today include:   No orders of the defined types were placed in this encounter.    Disposition:   FU with Dr. Tamala Julian in a few months. She has a visit with PCP next week as well.   Patient is agreeable to this plan and will call if any problems develop in the interim.   SignedTruitt Merle, NP  01/19/2018 10:45 AM  [place] [address] [place], [place]  [address] Phone: [phone] Fax: [phone]

## 2018-01-19 NOTE — Patient Instructions (Addendum)
We will be checking the following labs today - NONE   Medication Instructions:    Continue with your current medicines. BUT  I am increasing the Toprol to 50 mg to take twice a day    Testing/Procedures To Be Arranged:  N/A  Follow-Up:   See Dr. Tamala Julian in about 4 months    Other Special Instructions:   N/A    If you need a refill on your cardiac medications before your next appointment, please call your pharmacy.   Call the [place] office at [phone] if you have any questions, problems or concerns.

## 2018-04-08 ENCOUNTER — Other Ambulatory Visit: Payer: Self-pay | Admitting: Family Medicine

## 2018-04-08 DIAGNOSIS — E1151 Type 2 diabetes mellitus with diabetic peripheral angiopathy without gangrene: Secondary | ICD-10-CM

## 2018-04-22 NOTE — Progress Notes (Signed)
Cardiology Office Note    Date:  04/23/2018   ID:  Cynthia Gilmore, DOB Dec 10, 1933, MRN [id number]  PCP:  Aurea Graff.Marlou Sa, MD  Cardiologist: Sinclair Grooms, MD   Chief Complaint  Patient presents with  . Irregular Heart Beat    History of Present Illness:  Cynthia Gilmore is a 84 y.o. female with h/o palpitations and prior unremarkable cardiac workup.  Is doing relatively well.  Her blood pressures have generally run below 145/80 mmHg.  Palpitations have been improved off caffeine.  She does not restrict salt in her diet.  She is concerned about her blood pressure recordings.  She denies chest pain.  She has difficulty sleeping.  Past Medical History:  Diagnosis Date  . Allergy   . Anxiety   . Anxiety and depression    ? bipolar  . Cataract    bil cataracts removed  . Depression   . Diabetes mellitus, type 2 ([place])   . GERD (gastroesophageal reflux disease)   . Heart murmur   . Hemorrhoids 2012   . Hiatal hernia   . Hypertension   . IBS (irritable bowel syndrome)   . Lymphocytic colitis 2012  . Mumerous medication allergies and sensitivities 07/20/2015  . Osteoarthritis (arthritis due to wear and tear of joints)   . Segmental colitis ([place])    iscemic colitis 2009  . Stroke [facility])    states 2 "'mini strokes" 1980  . TIA (transient ischemic attack) 2000   x 3     Past Surgical History:  Procedure Laterality Date  . ANKLE SURGERY  1999   right ankle fracture surgery  . BLADDER REPAIR  1995  . CATARACT EXTRACTION, BILATERAL    . CHOLECYSTECTOMY  2004  . COLONOSCOPY W/ BIOPSIES AND POLYPECTOMY   07/29/2011   internal hemorrhoids, lymphocytic colitis  . ESOPHAGEAL MANOMETRY     Normal  . ESOPHAGOGASTRODUODENOSCOPY  07/29/2011   gastritis,hiatal hernia, tortuous esophagus  . EUS  06/03/2012   Procedure: UPPER ENDOSCOPIC ULTRASOUND (EUS) LINEAR;  Surgeon: Milus Banister, MD;  Location: [facility];  Service: Endoscopy;  Laterality: N/A;  . HERNIA REPAIR  1995   . PARTIAL HYSTERECTOMY  1992   partial    Current Medications: Outpatient Medications Prior to Visit  Medication Sig Dispense Refill  . ALPRAZolam (XANAX) 0.25 MG tablet TAKE 1/2 TO 1 TABLET BY MOUTH ONCE DAILY AS NEEDED 30 tablet 0  . anti-nausea (EMETROL) solution Take 5 mLs by mouth as needed for nausea or vomiting.    . Ascorbic Acid (VITAMIN C) 500 MG CAPS Take by mouth.    . busPIRone (BUSPAR) 10 MG tablet Take 10 mg by mouth daily.  1  . glucose blood (ACCU-CHEK SMARTVIEW) test strip 1 each by Other route daily. 100 each 12  . hyoscyamine (ANASPAZ) 0.125 MG TBDP disintergrating tablet Place 1 tablet (0.125 mg total) under the tongue every 4 (four) hours as needed for cramping. 90 tablet 5  . ibuprofen (ADVIL,MOTRIN) 200 MG tablet Take 200 mg by mouth every 6 (six) hours as needed.    Marland Kitchen losartan (COZAAR) 100 MG tablet Take 100 mg by mouth daily.    . metFORMIN (GLUCOPHAGE-XR) 500 MG 24 hr tablet TAKE 1 TABLET(500 MG) BY MOUTH TWICE DAILY WITH A MEAL 180 tablet 0  . Misc Natural Products (ESTROVEN ENERGY PO) Take by mouth.    Marland Kitchen omeprazole (PRILOSEC OTC) 20 MG tablet Take 20 mg by mouth daily as needed (acid  reflux).    . ondansetron (ZOFRAN) 4 MG tablet TAKE 1 TABLET BY MOUTH FOUR TIMES DAILY( BEFORE A MEAL AND EVERY NIGHT AT BEDTIME) 120 tablet 11  . ranitidine (ZANTAC) 150 MG tablet TAKE 1 TABLET BY MOUTH DAILY 90 tablet 0  . spironolactone (ALDACTONE) 25 MG tablet Take 1 tablet (25 mg total) by mouth daily. 90 tablet 1  . metoprolol succinate (TOPROL-XL) 50 MG 24 hr tablet Take 1 tablet (50 mg total) by mouth 2 (two) times daily. Take with or immediately following a meal. 180 tablet 3  . chlorpheniramine-HYDROcodone (TUSSIONEX PENNKINETIC ER) 10-8 MG/5ML SUER Take 5 mLs by mouth at bedtime as needed for cough. (Patient not taking: Reported on 04/23/2018) 140 mL 0  . losartan (COZAAR) 100 MG tablet Take 1 tablet (100 mg total) by mouth daily. 90 tablet 3   No facility-administered  medications prior to visit.      Allergies:   Alprazolam; Amlodipine besylate; Aripiprazole; Augmentin [amoxicillin-pot clavulanate]; Azithromycin; Bupropion hcl; Cefaclor; Celecoxib; Cephalexin; Ciprofloxacin; Clonidine hydrochloride; Desvenlafaxine; Diphenoxylate-atropine; Divalproex sodium; Doxycycline; Entex; Est estrogens-methyltest; Fluoxetine hcl; Furosemide; Hydrochlorothiazide; Hydrocodone-acetaminophen; Lamotrigine; Lansoprazole; Lisinopril; Lorazepam; Meloxicam; Methylphenidate hcl; Metronidazole; Mirtazapine; Nabumetone; Nitrofuran derivatives; Olmesartan medoxomil; Other; Oxycodone-acetaminophen; Pantoprazole sodium; Paroxetine; Quetiapine; Rabeprazole sodium; Rofecoxib; Sertraline hcl; Sumatriptan; Telithromycin; Topamax; Tramadol-acetaminophen; Trintellix [vortioxetine]; Venlafaxine; Verapamil; and Vilazodone hcl [vilazodone hcl]   Social History   Socioeconomic History  . Marital status: Married    Spouse name: Not on file  . Number of children: 2  . Years of education: Not on file  . Highest education level: Not on file  Occupational History  . Occupation: Retired    Fish farm manager: RETIRED  Social Needs  . Financial resource strain: Not on file  . Food insecurity:    Worry: Not on file    Inability: Not on file  . Transportation needs:    Medical: Not on file    Non-medical: Not on file  Tobacco Use  . Smoking status: Former Smoker    Types: Cigarettes  . Smokeless tobacco: Never Used  . Tobacco comment: stopped in 1962  Substance and Sexual Activity  . Alcohol use: No    Alcohol/week: 0.0 oz  . Drug use: No  . Sexual activity: Not on file  Lifestyle  . Physical activity:    Days per week: Not on file    Minutes per session: Not on file  . Stress: Not on file  Relationships  . Social connections:    Talks on phone: Not on file    Gets together: Not on file    Attends religious service: Not on file    Active member of club or organization: Not on file     Attends meetings of clubs or organizations: Not on file    Relationship status: Not on file  Other Topics Concern  . Not on file  Social History Narrative   Occupation: retired   Patient is a former smoker. -stopped 1962   Alcohol Use - no     Illicit Drug Use - no         Daily Caffeine Use  -1 cup daily           Position roster   Cardiologist-Dr. Tamala Julian   GYN-Dr. Corinna Capra   Ophthalmologist-Dr. Katy Fitch   Gastroenterologist-Dr. Carlean Purl     Family History:  The patient's family history includes Diabetes in her maternal aunt, maternal uncle, and mother; Heart disease in her father and paternal aunt.   ROS:   Please see  the history of present illness.    Other complaints include snoring nausea and vomiting, anxiety, difficulty with balance, headaches, dizziness, easy bruising, back pain, depression, diarrhea, leg pain, skipped heartbeats, excessive fatigue, and sweating.  Multiple allergies to multiple antihypertensives including all calcium channel blockers. All other systems reviewed and are negative.   PHYSICAL EXAM:   VS:  BP (!) 148/72   Pulse 61   Ht 5' 5.5" (1.664 m)   Wt 158 lb 12.8 oz (72 kg)   BMI 26.02 kg/m    GEN: Well nourished, well developed, in no acute distress  HEENT: normal  Neck: no JVD, carotid bruits, or masses Cardiac: RRR; no murmurs, rubs, or gallops,no edema  Respiratory:  clear to auscultation bilaterally, normal work of breathing GI: soft, nontender, nondistended, + BS MS: no deformity or atrophy  Skin: warm and dry, no rash Neuro:  Alert and Oriented x 3, Strength and sensation are intact Psych: euthymic mood, full affect  Wt Readings from Last 3 Encounters:  04/23/18 158 lb 12.8 oz (72 kg)  01/19/18 160 lb 12.8 oz (72.9 kg)  01/17/18 155 lb (70.3 kg)      Studies/Labs Reviewed:   EKG:  EKG  None  Recent Labs: 12/15/2017: Platelets 359; TSH 1.230 01/17/2018: BUN 18; Creatinine, Ser 0.60; Hemoglobin 14.3; Potassium 4.6; Sodium 141   Lipid  Panel    Component Value Date/Time   CHOL 196 03/24/2017 1048   TRIG 209.0 (H) 03/24/2017 1048   HDL 49.40 03/24/2017 1048   CHOLHDL 4 03/24/2017 1048   VLDL 41.8 (H) 03/24/2017 1048   LDLCALC 98 12/19/2016 1457   LDLDIRECT 124.0 03/24/2017 1048    Additional studies/ records that were reviewed today include:  None    ASSESSMENT:    1. Palpitations   2. Essential hypertension      PLAN:  In order of problems listed above:  1. Documented previously to be PACs and PVCs but with poor correlation to complaints when monitored.  Currently, since decreasing caffeine intake palpitations have significantly improved. 2. Encourage low-salt diet.  Discussed target blood pressure less than 140/90 and goal 130/80 mmHg.  Encouraged aerobic activity.  Follow-up in 9 to 12 months with Dr. Tamala Julian or Truitt Merle NP    Medication Adjustments/Labs and Tests Ordered: Current medicines are reviewed at length with the patient today.  Concerns regarding medicines are outlined above.  Medication changes, Labs and Tests ordered today are listed in the Patient Instructions below. Patient Instructions  Medication Instructions:  No changes  Labwork: None ordered  Testing/Procedures: None ordered  Follow-Up: Your physician wants you to follow-up in: 10-12 months with Dr. Tamala Julian or Truitt Merle NP. You will receive a reminder letter in the mail two months in advance. If you don't receive a letter, please call our office to schedule the follow-up appointment.   Any Other Special Instructions Will Be Listed Below (If Applicable).  Your physician discussed the importance of regular exercise and recommended that you start or continue a regular exercise program for good health.    Low-Sodium Eating Plan Sodium, which is an element that makes up salt, helps you maintain a healthy balance of fluids in your body. Too much sodium can increase your blood pressure and cause fluid and waste to be held  in your body. Your health care provider or dietitian may recommend following this plan if you have high blood pressure (hypertension), kidney disease, liver disease, or heart failure. Eating less sodium can help lower your  blood pressure, reduce swelling, and protect your heart, liver, and kidneys. What are tips for following this plan? General guidelines  Most people on this plan should limit their sodium intake to 1,500-2,000 mg (milligrams) of sodium each day. Reading food labels  The Nutrition Facts label lists the amount of sodium in one serving of the food. If you eat more than one serving, you must multiply the listed amount of sodium by the number of servings.  Choose foods with less than 140 mg of sodium per serving.  Avoid foods with 300 mg of sodium or more per serving. Shopping  Look for lower-sodium products, often labeled as "low-sodium" or "no salt added."  Always check the sodium content even if foods are labeled as "unsalted" or "no salt added".  Buy fresh foods. ? Avoid canned foods and premade or frozen meals. ? Avoid canned, cured, or processed meats  Buy breads that have less than 80 mg of sodium per slice. Cooking  Eat more home-cooked food and less restaurant, buffet, and fast food.  Avoid adding salt when cooking. Use salt-free seasonings or herbs instead of table salt or sea salt. Check with your health care provider or pharmacist before using salt substitutes.  Cook with plant-based oils, such as canola, sunflower, or olive oil. Meal planning  When eating at a restaurant, ask that your food be prepared with less salt or no salt, if possible.  Avoid foods that contain MSG (monosodium glutamate). MSG is sometimes added to [place] food, bouillon, and some canned foods. What foods are recommended? The items listed may not be a complete list. Talk with your dietitian about what dietary choices are best for you. Grains Low-sodium cereals, including oats,  puffed wheat and rice, and shredded wheat. Low-sodium crackers. Unsalted rice. Unsalted pasta. Low-sodium bread. Whole-grain breads and whole-grain pasta. Vegetables Fresh or frozen vegetables. "No salt added" canned vegetables. "No salt added" tomato sauce and paste. Low-sodium or reduced-sodium tomato and vegetable juice. Fruits Fresh, frozen, or canned fruit. Fruit juice. Meats and other protein foods Fresh or frozen (no salt added) meat, poultry, seafood, and fish. Low-sodium canned tuna and salmon. Unsalted nuts. Dried peas, beans, and lentils without added salt. Unsalted canned beans. Eggs. Unsalted nut butters. Dairy Milk. Soy milk. Cheese that is naturally low in sodium, such as ricotta cheese, fresh mozzarella, or Swiss cheese Low-sodium or reduced-sodium cheese. Cream cheese. Yogurt. Fats and oils Unsalted butter. Unsalted margarine with no trans fat. Vegetable oils such as canola or olive oils. Seasonings and other foods Fresh and dried herbs and spices. Salt-free seasonings. Low-sodium mustard and ketchup. Sodium-free salad dressing. Sodium-free light mayonnaise. Fresh or refrigerated horseradish. Lemon juice. Vinegar. Homemade, reduced-sodium, or low-sodium soups. Unsalted popcorn and pretzels. Low-salt or salt-free chips. What foods are not recommended? The items listed may not be a complete list. Talk with your dietitian about what dietary choices are best for you. Grains Instant hot cereals. Bread stuffing, pancake, and biscuit mixes. Croutons. Seasoned rice or pasta mixes. Noodle soup cups. Boxed or frozen macaroni and cheese. Regular salted crackers. Self-rising flour. Vegetables Sauerkraut, pickled vegetables, and relishes. Olives. [place] fries. Onion rings. Regular canned vegetables (not low-sodium or reduced-sodium). Regular canned tomato sauce and paste (not low-sodium or reduced-sodium). Regular tomato and vegetable juice (not low-sodium or reduced-sodium). Frozen vegetables  in sauces. Meats and other protein foods Meat or fish that is salted, canned, smoked, spiced, or pickled. Bacon, ham, sausage, hotdogs, corned beef, chipped beef, packaged lunch meats, salt pork, jerky,  pickled herring, anchovies, regular canned tuna, sardines, salted nuts. Dairy Processed cheese and cheese spreads. Cheese curds. Blue cheese. Feta cheese. String cheese. Regular cottage cheese. Buttermilk. Canned milk. Fats and oils Salted butter. Regular margarine. Ghee. Bacon fat. Seasonings and other foods Onion salt, garlic salt, seasoned salt, table salt, and sea salt. Canned and packaged gravies. Worcestershire sauce. Tartar sauce. Barbecue sauce. Teriyaki sauce. Soy sauce, including reduced-sodium. Steak sauce. Fish sauce. Oyster sauce. Cocktail sauce. Horseradish that you find on the shelf. Regular ketchup and mustard. Meat flavorings and tenderizers. Bouillon cubes. Hot sauce and Tabasco sauce. Premade or packaged marinades. Premade or packaged taco seasonings. Relishes. Regular salad dressings. Salsa. Potato and tortilla chips. Corn chips and puffs. Salted popcorn and pretzels. Canned or dried soups. Pizza. Frozen entrees and pot pies. Summary  Eating less sodium can help lower your blood pressure, reduce swelling, and protect your heart, liver, and kidneys.  Most people on this plan should limit their sodium intake to 1,500-2,000 mg (milligrams) of sodium each day.  Canned, boxed, and frozen foods are high in sodium. Restaurant foods, fast foods, and pizza are also very high in sodium. You also get sodium by adding salt to food.  Try to cook at home, eat more fresh fruits and vegetables, and eat less fast food, canned, processed, or prepared foods. This information is not intended to replace advice given to you by your health care provider. Make sure you discuss any questions you have with your health care provider. Document Released: 05/02/2002 Document Revised: 11/03/2016 Document  Reviewed: 11/03/2016 Elsevier Interactive Patient Education  [employer].      If you need a refill on your cardiac medications before your next appointment, please call your pharmacy.     Signed, Sinclair Grooms, MD  04/23/2018 9:54 AM    [facility] [address], [place], [place]  [address] Phone: [phone]; Fax: [phone]

## 2018-04-23 ENCOUNTER — Encounter: Payer: Self-pay | Admitting: Interventional Cardiology

## 2018-04-23 ENCOUNTER — Ambulatory Visit: Payer: Medicare Other | Admitting: Interventional Cardiology

## 2018-04-23 ENCOUNTER — Encounter (INDEPENDENT_AMBULATORY_CARE_PROVIDER_SITE_OTHER): Payer: Self-pay

## 2018-04-23 VITALS — BP 148/72 | HR 61 | Ht 65.5 in | Wt 158.8 lb

## 2018-04-23 DIAGNOSIS — I10 Essential (primary) hypertension: Secondary | ICD-10-CM | POA: Diagnosis not present

## 2018-04-23 DIAGNOSIS — R002 Palpitations: Secondary | ICD-10-CM | POA: Diagnosis not present

## 2018-04-23 NOTE — Patient Instructions (Addendum)
Medication Instructions:  No changes  Labwork: None ordered  Testing/Procedures: None ordered  Follow-Up: Your physician wants you to follow-up in: 10-12 months with Dr. Tamala Julian or Truitt Merle NP. You will receive a reminder letter in the mail two months in advance. If you don't receive a letter, please call our office to schedule the follow-up appointment.   Any Other Special Instructions Will Be Listed Below (If Applicable).  Your physician discussed the importance of regular exercise and recommended that you start or continue a regular exercise program for good health.    Low-Sodium Eating Plan Sodium, which is an element that makes up salt, helps you maintain a healthy balance of fluids in your body. Too much sodium can increase your blood pressure and cause fluid and waste to be held in your body. Your health care provider or dietitian may recommend following this plan if you have high blood pressure (hypertension), kidney disease, liver disease, or heart failure. Eating less sodium can help lower your blood pressure, reduce swelling, and protect your heart, liver, and kidneys. What are tips for following this plan? General guidelines  Most people on this plan should limit their sodium intake to 1,500-2,000 mg (milligrams) of sodium each day. Reading food labels  The Nutrition Facts label lists the amount of sodium in one serving of the food. If you eat more than one serving, you must multiply the listed amount of sodium by the number of servings.  Choose foods with less than 140 mg of sodium per serving.  Avoid foods with 300 mg of sodium or more per serving. Shopping  Look for lower-sodium products, often labeled as "low-sodium" or "no salt added."  Always check the sodium content even if foods are labeled as "unsalted" or "no salt added".  Buy fresh foods. ? Avoid canned foods and premade or frozen meals. ? Avoid canned, cured, or processed meats  Buy breads that  have less than 80 mg of sodium per slice. Cooking  Eat more home-cooked food and less restaurant, buffet, and fast food.  Avoid adding salt when cooking. Use salt-free seasonings or herbs instead of table salt or sea salt. Check with your health care provider or pharmacist before using salt substitutes.  Cook with plant-based oils, such as canola, sunflower, or olive oil. Meal planning  When eating at a restaurant, ask that your food be prepared with less salt or no salt, if possible.  Avoid foods that contain MSG (monosodium glutamate). MSG is sometimes added to [place] food, bouillon, and some canned foods. What foods are recommended? The items listed may not be a complete list. Talk with your dietitian about what dietary choices are best for you. Grains Low-sodium cereals, including oats, puffed wheat and rice, and shredded wheat. Low-sodium crackers. Unsalted rice. Unsalted pasta. Low-sodium bread. Whole-grain breads and whole-grain pasta. Vegetables Fresh or frozen vegetables. "No salt added" canned vegetables. "No salt added" tomato sauce and paste. Low-sodium or reduced-sodium tomato and vegetable juice. Fruits Fresh, frozen, or canned fruit. Fruit juice. Meats and other protein foods Fresh or frozen (no salt added) meat, poultry, seafood, and fish. Low-sodium canned tuna and salmon. Unsalted nuts. Dried peas, beans, and lentils without added salt. Unsalted canned beans. Eggs. Unsalted nut butters. Dairy Milk. Soy milk. Cheese that is naturally low in sodium, such as ricotta cheese, fresh mozzarella, or Swiss cheese Low-sodium or reduced-sodium cheese. Cream cheese. Yogurt. Fats and oils Unsalted butter. Unsalted margarine with no trans fat. Vegetable oils such as canola or olive  oils. Seasonings and other foods Fresh and dried herbs and spices. Salt-free seasonings. Low-sodium mustard and ketchup. Sodium-free salad dressing. Sodium-free light mayonnaise. Fresh or refrigerated  horseradish. Lemon juice. Vinegar. Homemade, reduced-sodium, or low-sodium soups. Unsalted popcorn and pretzels. Low-salt or salt-free chips. What foods are not recommended? The items listed may not be a complete list. Talk with your dietitian about what dietary choices are best for you. Grains Instant hot cereals. Bread stuffing, pancake, and biscuit mixes. Croutons. Seasoned rice or pasta mixes. Noodle soup cups. Boxed or frozen macaroni and cheese. Regular salted crackers. Self-rising flour. Vegetables Sauerkraut, pickled vegetables, and relishes. Olives. [place] fries. Onion rings. Regular canned vegetables (not low-sodium or reduced-sodium). Regular canned tomato sauce and paste (not low-sodium or reduced-sodium). Regular tomato and vegetable juice (not low-sodium or reduced-sodium). Frozen vegetables in sauces. Meats and other protein foods Meat or fish that is salted, canned, smoked, spiced, or pickled. Bacon, ham, sausage, hotdogs, corned beef, chipped beef, packaged lunch meats, salt pork, jerky, pickled herring, anchovies, regular canned tuna, sardines, salted nuts. Dairy Processed cheese and cheese spreads. Cheese curds. Blue cheese. Feta cheese. String cheese. Regular cottage cheese. Buttermilk. Canned milk. Fats and oils Salted butter. Regular margarine. Ghee. Bacon fat. Seasonings and other foods Onion salt, garlic salt, seasoned salt, table salt, and sea salt. Canned and packaged gravies. Worcestershire sauce. Tartar sauce. Barbecue sauce. Teriyaki sauce. Soy sauce, including reduced-sodium. Steak sauce. Fish sauce. Oyster sauce. Cocktail sauce. Horseradish that you find on the shelf. Regular ketchup and mustard. Meat flavorings and tenderizers. Bouillon cubes. Hot sauce and Tabasco sauce. Premade or packaged marinades. Premade or packaged taco seasonings. Relishes. Regular salad dressings. Salsa. Potato and tortilla chips. Corn chips and puffs. Salted popcorn and pretzels. Canned or  dried soups. Pizza. Frozen entrees and pot pies. Summary  Eating less sodium can help lower your blood pressure, reduce swelling, and protect your heart, liver, and kidneys.  Most people on this plan should limit their sodium intake to 1,500-2,000 mg (milligrams) of sodium each day.  Canned, boxed, and frozen foods are high in sodium. Restaurant foods, fast foods, and pizza are also very high in sodium. You also get sodium by adding salt to food.  Try to cook at home, eat more fresh fruits and vegetables, and eat less fast food, canned, processed, or prepared foods. This information is not intended to replace advice given to you by your health care provider. Make sure you discuss any questions you have with your health care provider. Document Released: 05/02/2002 Document Revised: 11/03/2016 Document Reviewed: 11/03/2016 Elsevier Interactive Patient Education  [employer].      If you need a refill on your cardiac medications before your next appointment, please call your pharmacy.

## 2018-05-19 ENCOUNTER — Telehealth: Payer: Self-pay | Admitting: Interventional Cardiology

## 2018-05-19 NOTE — Telephone Encounter (Signed)
New Message:       STAT if patient feels like he/she is going to faint   1) Are you dizzy now? No  2) Do you feel faint or have you passed out? No  3) Do you have any other symptoms? Nausea and sweating  4) Have you checked your HR and BP (record if available)? No    Pt states she if feels better now but not sure as to what it may have been to cause this

## 2018-05-19 NOTE — Telephone Encounter (Signed)
Spoke with pt and she states she had an episode today where she became nauseous, sweating and felt weak and off balance.  States episode did not last long and feels better now.  Denies dizziness, CP or SOB with episode.  Vitals are 121/62, HR 59.  Advised pt if this happens again to contact PCP.  Pt verbalized understanding and was appreciative for call.

## 2018-06-28 ENCOUNTER — Other Ambulatory Visit: Payer: Self-pay | Admitting: Family Medicine

## 2018-06-28 DIAGNOSIS — E1151 Type 2 diabetes mellitus with diabetic peripheral angiopathy without gangrene: Secondary | ICD-10-CM

## 2018-09-17 ENCOUNTER — Ambulatory Visit: Payer: Medicare Other | Admitting: Internal Medicine

## 2018-09-17 ENCOUNTER — Encounter: Payer: Self-pay | Admitting: Internal Medicine

## 2018-09-17 VITALS — BP 114/64 | HR 62 | Ht 65.5 in | Wt 165.0 lb

## 2018-09-17 DIAGNOSIS — K580 Irritable bowel syndrome with diarrhea: Secondary | ICD-10-CM | POA: Diagnosis not present

## 2018-09-17 DIAGNOSIS — R1013 Epigastric pain: Secondary | ICD-10-CM | POA: Diagnosis not present

## 2018-09-17 DIAGNOSIS — F339 Major depressive disorder, recurrent, unspecified: Secondary | ICD-10-CM | POA: Diagnosis not present

## 2018-09-17 MED ORDER — ONDANSETRON HCL 4 MG PO TABS: ORAL_TABLET | ORAL | 11 refills | Status: DC

## 2018-09-17 MED ORDER — FAMOTIDINE 40 MG PO TABS: 40.0000 mg | ORAL_TABLET | Freq: Two times a day (BID) | ORAL | 3 refills | Status: DC

## 2018-09-17 NOTE — Progress Notes (Signed)
Cynthia Gilmore 84 y.o. Sep 14, 1934 [id number]  Assessment & Plan:   Encounter Diagnoses  Name Primary?  . Irritable bowel syndrome with diarrhea Yes  . Dyspepsia   . Depression, recurrent ([place])      Start famotidine 40 mg twice daily to replace the ranitidine 300 mg twice daily Ondansetron refill  I told her I could not really help her with her depression but suggested she seek another opinion and gave her the number to behavioral health.  It sounds like from what I know in the past she has been on numerous medications and they cause side effects are been unhelpful or both.  She even tried the magnetic therapy through Dr. Caprice Beaver but that did not help.  I did tell her the only thing I knew other than that would be electroconvulsive therapy and I thought that that was typically done for more severe cases but she does seem to have refractory issues she can ask them about it.  Printed some information from up-to-date about ECT.  Again I explained that would be a decision she and a psychiatrist would had to make.   Subjective:   Chief Complaint:  HPI Cynthia Gilmore is here with her husband.  She says her reflux got worse after she stopped Zantac given the problems with MDMA.  She is using some omeprazole intermittently but that makes her diarrhea flare.  She would like other treatment.  She would also like help with her depression.  Once a refill on her ondansetron which helps her IBS D  Far as depression she reports significant anhedonia and crying episodes which is been chronic for many years. Allergies  Allergen Reactions  . Alprazolam     REACTION: sleepy  . Amlodipine Besylate     REACTION: swelling  . Aripiprazole   . Augmentin [Amoxicillin-Pot Clavulanate] Diarrhea  . Azithromycin   . Bupropion Hcl     REACTION: insomnia  . Cefaclor     REACTION: mouth blisters  . Celecoxib     REACTION: "drained"  . Cephalexin   . Ciprofloxacin   . Clonidine Hydrochloride    REACTION: headache, dizziness  . Desvenlafaxine   . Diphenoxylate-Atropine   . Divalproex Sodium     REACTION: nausea,tremors,fatigue  . Doxycycline   . Entex     REACTION: nausea, headache  . Est Estrogens-Methyltest     REACTION: headache  . Fluoxetine Hcl   . Furosemide   . Hydrochlorothiazide   . Hydrocodone-Acetaminophen     REACTION: headache  . Lamotrigine     REACTION: mood swing,chills, nausea  . Lansoprazole   . Lisinopril     REACTION: cough  . Lorazepam   . Meloxicam     REACTION: nausea, diarrhea  . Methylphenidate Hcl   . Metronidazole   . Mirtazapine     REACTION: swelling  . Nabumetone     REACTION: nausea, headache  . Nitrofuran Derivatives Nausea Only  . Olmesartan Medoxomil   . Other     Alum and Mag Hydroxide Simeth(Solution) GI Cocktail. Diarrhea   . Oxycodone-Acetaminophen   . Pantoprazole Sodium   . Paroxetine     REACTION: tremor, insomnia, nausea  . Quetiapine   . Rabeprazole Sodium     REACTION: diarrhea  . Rofecoxib   . Sertraline Hcl     REACTION: exreme confusion  . Sumatriptan     REACTION: cardiologist says "no"  . Telithromycin     REACTION: "drained"  . Topamax  Confusion and mood swings  . Tramadol-Acetaminophen   . Trintellix [Vortioxetine] Other (See Comments)    "Made things worst"  . Venlafaxine     REACTION: hypertension, nausea, headache  . Verapamil     Headaches and hot flashes   . Vilazodone Hcl [Vilazodone Hcl]    Current Meds  Medication Sig  . ALPRAZolam (XANAX) 0.25 MG tablet TAKE 1/2 TO 1 TABLET BY MOUTH ONCE DAILY AS NEEDED  . anti-nausea (EMETROL) solution Take 5 mLs by mouth as needed for nausea or vomiting.  . Ascorbic Acid (VITAMIN C) 500 MG CAPS Take by mouth.  Marland Kitchen glucose blood (ACCU-CHEK SMARTVIEW) test strip 1 each by Other route daily.  . hyoscyamine (ANASPAZ) 0.125 MG TBDP disintergrating tablet Place 1 tablet (0.125 mg total) under the tongue every 4 (four) hours as needed for cramping.  Marland Kitchen  ibuprofen (ADVIL,MOTRIN) 200 MG tablet Take 200 mg by mouth every 6 (six) hours as needed.  Marland Kitchen losartan (COZAAR) 100 MG tablet Take 100 mg by mouth daily.  . metFORMIN (GLUCOPHAGE-XR) 500 MG 24 hr tablet TAKE 1 TABLET(500 MG) BY MOUTH TWICE DAILY WITH A MEAL  . Misc Natural Products (ESTROVEN ENERGY PO) Take by mouth.  Marland Kitchen omeprazole (PRILOSEC OTC) 20 MG tablet Take 20 mg by mouth daily as needed (acid reflux).  . ondansetron (ZOFRAN) 4 MG tablet TAKE 1 TABLET BY MOUTH FOUR TIMES DAILY( BEFORE A MEAL AND EVERY NIGHT AT BEDTIME)  . spironolactone (ALDACTONE) 25 MG tablet Take 1 tablet (25 mg total) by mouth daily.  . [DISCONTINUED] ondansetron (ZOFRAN) 4 MG tablet TAKE 1 TABLET BY MOUTH FOUR TIMES DAILY( BEFORE A MEAL AND EVERY NIGHT AT BEDTIME)   Past Medical History:  Diagnosis Date  . Allergy   . Anxiety   . Anxiety and depression    ? bipolar  . Cataract    bil cataracts removed  . Depression   . Diabetes mellitus, type 2 ([place])   . GERD (gastroesophageal reflux disease)   . Heart murmur   . Hemorrhoids 2012   . Hiatal hernia   . Hypertension   . IBS (irritable bowel syndrome)   . Lymphocytic colitis 2012  . Mumerous medication allergies and sensitivities 07/20/2015  . Osteoarthritis (arthritis due to wear and tear of joints)   . Segmental colitis ([place])    iscemic colitis 2009  . Stroke [facility])    states 2 "'mini strokes" 1980  . TIA (transient ischemic attack) 2000   x 3    Past Surgical History:  Procedure Laterality Date  . ANKLE SURGERY  1999   right ankle fracture surgery  . BLADDER REPAIR  1995  . CATARACT EXTRACTION, BILATERAL    . CHOLECYSTECTOMY  2004  . COLONOSCOPY W/ BIOPSIES AND POLYPECTOMY   07/29/2011   internal hemorrhoids, lymphocytic colitis  . ESOPHAGEAL MANOMETRY     Normal  . ESOPHAGOGASTRODUODENOSCOPY  07/29/2011   gastritis,hiatal hernia, tortuous esophagus  . EUS  06/03/2012   Procedure: UPPER ENDOSCOPIC ULTRASOUND (EUS) LINEAR;  Surgeon: Milus Banister, MD;  Location: [facility] END[facility];  Service: Endoscopy;  Laterality: N/A;  . HERNIA REPAIR  1995  . PARTIAL HYSTERECTOMY  1992   partial   Social History   Social History Narrative   Occupation: retired   Patient is a former smoker. -stopped 1962   Alcohol Use - no     Illicit Drug Use - no         Daily Caffeine Use  -1 cup daily  Position roster   Cardiologist-Dr. Tamala Julian   GYN-Dr. Corinna Capra   Ophthalmologist-Dr. Katy Fitch   Gastroenterologist-Dr. Carlean Purl   family history includes Diabetes in her maternal aunt, maternal uncle, and mother; Heart disease in her father and paternal aunt.   Review of Systems See HPI  Objective:   Physical Exam BP 114/64   Pulse 62   Ht 5' 5.5" (1.664 m)   Wt 165 lb (74.8 kg)   BMI 27.04 kg/m  No acute distress Normal affect  15 minutes time spent with patient > half in counseling coordination of care

## 2018-09-17 NOTE — Patient Instructions (Signed)
  We have sent the following medications to your pharmacy for you to pick up at your convenience: Generic pepcid, generic zofran   We are giving you information on ECT to read.   I appreciate the opportunity to care for you. Silvano Rusk, MD, [facility]

## 2019-01-18 ENCOUNTER — Encounter: Payer: Self-pay | Admitting: Interventional Cardiology

## 2019-01-18 ENCOUNTER — Encounter (INDEPENDENT_AMBULATORY_CARE_PROVIDER_SITE_OTHER): Payer: Self-pay

## 2019-01-18 ENCOUNTER — Ambulatory Visit: Payer: Medicare Other | Admitting: Interventional Cardiology

## 2019-01-18 VITALS — BP 128/64 | HR 67 | Ht 65.5 in | Wt 161.6 lb

## 2019-01-18 DIAGNOSIS — R002 Palpitations: Secondary | ICD-10-CM

## 2019-01-18 DIAGNOSIS — E785 Hyperlipidemia, unspecified: Secondary | ICD-10-CM | POA: Diagnosis not present

## 2019-01-18 DIAGNOSIS — I10 Essential (primary) hypertension: Secondary | ICD-10-CM

## 2019-01-18 DIAGNOSIS — Q244 Congenital subaortic stenosis: Secondary | ICD-10-CM

## 2019-01-18 NOTE — Patient Instructions (Signed)

## 2019-01-18 NOTE — Progress Notes (Signed)
Cardiology Office Note:    Date:  01/18/2019   ID:  Cynthia Gilmore, DOB December 15, 1933, MRN [id number]  PCP:  Aurea Graff.Marlou Sa, MD  Cardiologist:  Sinclair Grooms, MD   Referring MD: Aurea Graff.Marlou Sa, MD   Chief Complaint  Patient presents with  . Heart Murmur    History of Present Illness:    Cynthia Gilmore is a 85 y.o. female with a hx of subvalvular aortic stenosis/subvalvular LV outflow obstruction, anxiety disorder, hypertension, and type 2 diabetes.  Also has a longstanding history of intermittent chest discomfort.  Doing well without significant chest pain, dyspnea, syncope, edema, orthopnea, or PND.  Has occasional palpitations.  Palpitations usually resolves within 2 to 3 minutes.  Past Medical History:  Diagnosis Date  . Allergy   . Anxiety   . Anxiety and depression    ? bipolar  . Cataract    bil cataracts removed  . Depression   . Diabetes mellitus, type 2 ([place])   . GERD (gastroesophageal reflux disease)   . Heart murmur   . Hemorrhoids 2012   . Hiatal hernia   . Hypertension   . IBS (irritable bowel syndrome)   . Lymphocytic colitis 2012  . Mumerous medication allergies and sensitivities 07/20/2015  . Osteoarthritis (arthritis due to wear and tear of joints)   . Segmental colitis ([place])    iscemic colitis 2009  . Stroke [facility])    states 2 "'mini strokes" 1980  . TIA (transient ischemic attack) 2000   x 3     Past Surgical History:  Procedure Laterality Date  . ANKLE SURGERY  1999   right ankle fracture surgery  . BLADDER REPAIR  1995  . CATARACT EXTRACTION, BILATERAL    . CHOLECYSTECTOMY  2004  . COLONOSCOPY W/ BIOPSIES AND POLYPECTOMY   07/29/2011   internal hemorrhoids, lymphocytic colitis  . ESOPHAGEAL MANOMETRY     Normal  . ESOPHAGOGASTRODUODENOSCOPY  07/29/2011   gastritis,hiatal hernia, tortuous esophagus  . EUS  06/03/2012   Procedure: UPPER ENDOSCOPIC ULTRASOUND (EUS) LINEAR;  Surgeon: Milus Banister, MD;  Location: [facility];   Service: Endoscopy;  Laterality: N/A;  . HERNIA REPAIR  1995  . PARTIAL HYSTERECTOMY  1992   partial    Current Medications: Current Meds  Medication Sig  . ALPRAZolam (XANAX) 0.25 MG tablet TAKE 1/2 TO 1 TABLET BY MOUTH ONCE DAILY AS NEEDED  . anti-nausea (EMETROL) solution Take 5 mLs by mouth as needed for nausea or vomiting.  . Ascorbic Acid (VITAMIN C) 500 MG CAPS Take by mouth.  . famotidine (PEPCID) 40 MG tablet Take 1 tablet (40 mg total) by mouth 2 (two) times daily.  Marland Kitchen glucose blood (ACCU-CHEK SMARTVIEW) test strip 1 each by Other route daily.  . hyoscyamine (ANASPAZ) 0.125 MG TBDP disintergrating tablet Place 1 tablet (0.125 mg total) under the tongue every 4 (four) hours as needed for cramping.  Marland Kitchen ibuprofen (ADVIL,MOTRIN) 200 MG tablet Take 200 mg by mouth every 6 (six) hours as needed.  Marland Kitchen losartan (COZAAR) 100 MG tablet Take 100 mg by mouth daily.  . metFORMIN (GLUCOPHAGE-XR) 500 MG 24 hr tablet TAKE 1 TABLET(500 MG) BY MOUTH TWICE DAILY WITH A MEAL  . metoprolol succinate (TOPROL-XL) 50 MG 24 hr tablet Take 1 tablet (50 mg total) by mouth 2 (two) times daily. Take with or immediately following a meal.  . Misc Natural Products (ESTROVEN ENERGY PO) Take by mouth.  . ondansetron (ZOFRAN) 4 MG tablet TAKE  1 TABLET BY MOUTH FOUR TIMES DAILY( BEFORE A MEAL AND EVERY NIGHT AT BEDTIME)  . spironolactone (ALDACTONE) 25 MG tablet Take 1 tablet (25 mg total) by mouth daily.     Allergies:   Alprazolam; Amlodipine besylate; Aripiprazole; Augmentin [amoxicillin-pot clavulanate]; Azithromycin; Bupropion hcl; Cefaclor; Celecoxib; Cephalexin; Ciprofloxacin; Clonidine hydrochloride; Desvenlafaxine; Diphenoxylate-atropine; Divalproex sodium; Doxycycline; Entex; Est estrogens-methyltest; Fluoxetine hcl; Furosemide; Hydrochlorothiazide; Hydrocodone-acetaminophen; Lamotrigine; Lansoprazole; Lisinopril; Lorazepam; Meloxicam; Methylphenidate hcl; Metronidazole; Mirtazapine; Nabumetone; Nitrofuran  derivatives; Olmesartan medoxomil; Omeprazole; Other; Oxycodone-acetaminophen; Pantoprazole sodium; Paroxetine; Quetiapine; Rabeprazole sodium; Rofecoxib; Sertraline hcl; Sumatriptan; Telithromycin; Topamax; Tramadol-acetaminophen; Trintellix [vortioxetine]; Venlafaxine; Verapamil; and Vilazodone hcl [vilazodone hcl]   Social History   Socioeconomic History  . Marital status: Married    Spouse name: Not on file  . Number of children: 2  . Years of education: Not on file  . Highest education level: Not on file  Occupational History  . Occupation: Retired    Fish farm manager: RETIRED  Social Needs  . Financial resource strain: Not on file  . Food insecurity:    Worry: Not on file    Inability: Not on file  . Transportation needs:    Medical: Not on file    Non-medical: Not on file  Tobacco Use  . Smoking status: Former Smoker    Types: Cigarettes  . Smokeless tobacco: Never Used  . Tobacco comment: stopped in 1962  Substance and Sexual Activity  . Alcohol use: No    Alcohol/week: 0.0 standard drinks  . Drug use: No  . Sexual activity: Not on file  Lifestyle  . Physical activity:    Days per week: Not on file    Minutes per session: Not on file  . Stress: Not on file  Relationships  . Social connections:    Talks on phone: Not on file    Gets together: Not on file    Attends religious service: Not on file    Active member of club or organization: Not on file    Attends meetings of clubs or organizations: Not on file    Relationship status: Not on file  Other Topics Concern  . Not on file  Social History Narrative   Occupation: retired   Patient is a former smoker. -stopped 1962   Alcohol Use - no     Illicit Drug Use - no         Daily Caffeine Use  -1 cup daily           Position roster   Cardiologist-Dr. Tamala Julian   GYN-Dr. Corinna Capra   Ophthalmologist-Dr. Katy Fitch   Gastroenterologist-Dr. Carlean Purl     Family History: The patient's family history includes Diabetes in her  maternal aunt, maternal uncle, and mother; Heart disease in her father and paternal aunt. There is no history of Colon cancer.  ROS:   Please see the history of present illness.    Drinks caffeine.  Has more palpitations when caffeine is used.  Increase anxiety and depression.  All other systems reviewed and are negative.  EKGs/Labs/Other Studies Reviewed:    The following studies were reviewed today:  2D Doppler echocardiogram 2014: Study Conclusions  - Left ventricle: The cavity size was normal. There was mild focal basal hypertrophy of the septum. Systolic function was vigorous. The estimated ejection fraction was in the range of 65% to 70%. Wall motion was normal; there were no regional wall motion abnormalities. - Aortic valve: Mild sub aortic valve gradient. - Mitral valve: There was systolic anterior motion. Mild regurgitation.  EKG:  EKG normal sinus rhythm, incomplete right bundle branch block, nonspecific ST-T wave abnormality.  When compared to prior tracing from January 18, 2018, no significant change has occurred.  Recent Labs: No results found for requested labs within last 8760 hours.  Recent Lipid Panel    Component Value Date/Time   CHOL 196 03/24/2017 1048   TRIG 209.0 (H) 03/24/2017 1048   HDL 49.40 03/24/2017 1048   CHOLHDL 4 03/24/2017 1048   VLDL 41.8 (H) 03/24/2017 1048   LDLCALC 98 12/19/2016 1457   LDLDIRECT 124.0 03/24/2017 1048    Physical Exam:    VS:  BP 128/64   Pulse 67   Ht 5' 5.5" (1.664 m)   Wt 161 lb 9.6 oz (73.3 kg)   SpO2 96%   BMI 26.48 kg/m     Wt Readings from Last 3 Encounters:  01/18/19 161 lb 9.6 oz (73.3 kg)  09/17/18 165 lb (74.8 kg)  04/23/18 158 lb 12.8 oz (72 kg)     GEN: Compatible with stated age. No acute distress HEENT: Normal NECK: No JVD. LYMPHATICS: No lymphadenopathy CARDIAC: RRR.  No murmur, no gallop, no edema VASCULAR: 2+ bilateral radial and carotid pulses, no bruits RESPIRATORY:  Clear  to auscultation without rales, wheezing or rhonchi  ABDOMEN: Soft, non-tender, non-distended, No pulsatile mass, MUSCULOSKELETAL: No deformity  SKIN: Warm and dry NEUROLOGIC:  Alert and oriented x 3 PSYCHIATRIC:  Normal affect   ASSESSMENT:    1. Subvalvular aortic stenosis   2. Essential hypertension   3. Palpitation   4. Hyperlipidemia with target LDL less than 70    PLAN:    In order of problems listed above:  1. No clinical evidence of such diagnosis. 2. Excellent blood pressure control. 3. Palpitations have been evaluated in the past by Dr. Wynonia Lawman.  No significant arrhythmias found.  Current status is unchanged from prior.  For the time being we will plan clinical observation.  Decrease caffeine in diet. 4. LDL target should be less than 100 and preferably near 70 although when her medication intolerance list is reviewed, have decided against statin therapy.  PRN follow-up if cardiac symptoms including more frequent or prolonged palpitation.  Otherwise follow-up in 1 year with Dr. Tamala Julian or a team member.   Medication Adjustments/Labs and Tests Ordered: Current medicines are reviewed at length with the patient today.  Concerns regarding medicines are outlined above.  Orders Placed This Encounter  Procedures  . EKG 12-Lead   No orders of the defined types were placed in this encounter.   Patient Instructions  Medication Instructions:  Your physician recommends that you continue on your current medications as directed. Please refer to the Current Medication list given to you today.  If you need a refill on your cardiac medications before your next appointment, please call your pharmacy.   Lab work: None If you have labs (blood work) drawn today and your tests are completely normal, you will receive your results only by: Marland Kitchen MyChart Message (if you have MyChart) OR . A paper copy in the mail If you have any lab test that is abnormal or we need to change your treatment, we  will call you to review the results.  Testing/Procedures: None  Follow-Up: At [facility], you and your health needs are our priority.  As part of our continuing mission to provide you with exceptional heart care, we have created designated Provider Care Teams.  These Care Teams include your primary Cardiologist (physician) and Advanced  Practice Providers (APPs -  Physician Assistants and Nurse Practitioners) who all work together to provide you with the care you need, when you need it. You will need a follow up appointment in 12 months.  Please call our office 2 months in advance to schedule this appointment.  You may see Sinclair Grooms, MD or one of the following Advanced Practice Providers on your designated Care Team:   Truitt Merle, NP Cecilie Kicks, NP . Kathyrn Drown, NP  Any Other Special Instructions Will Be Listed Below (If Applicable).       Signed, Sinclair Grooms, MD  01/18/2019 3:32 PM    [place]

## 2019-03-06 ENCOUNTER — Other Ambulatory Visit: Payer: Self-pay | Admitting: Nurse Practitioner

## 2019-05-13 ENCOUNTER — Telehealth: Payer: Self-pay | Admitting: Interventional Cardiology

## 2019-05-13 NOTE — Telephone Encounter (Signed)
New Message   Patient c/o Palpitations:  High priority if patient c/o lightheadedness, shortness of breath, or chest pain  1) How long have you had palpitations/irregular HR/ Afib? Are you having the symptoms now? On off since march 23  2) Are you currently experiencing lightheadedness, SOB or CP? lightheadness today. Two spells today.    3) Do you have a history of afib (atrial fibrillation) or irregular heart rhythm? yes  4) Have you checked your BP or HR? (document readings if available): HR 106 BP 150/80 (158/125 she said at one time today)   5) Are you experiencing any other symptoms? No

## 2019-05-13 NOTE — Telephone Encounter (Signed)
Pt reports palpitations and elevated BPs. Reports BP/HR today:      150/80, HR 106 (sitting watching TV)      158/125 Another time it was 168/128, HR 120 On 6/11 it was 148/123, HR 92      Pt has hx of palpitations, hasn't worn a monitor in 2 years. Informed pt that I am not sure that she is getting an accurate reading from her BP cuff.  Pt reports that she had it checkec at PCP 3 months ago and working fine. Inquired if she lived near fire department that could check her pressure and she does. Pt informed that I would forward this to Colletta Maryland, RN for further advisement and to make arrangement for f/u w/ Dr. Tamala Julian to further discuss/evaluate. Patient verbalized understanding and agreeable to plan.

## 2019-05-13 NOTE — Telephone Encounter (Signed)
She should take it easy. If continues or gets SOB, go to ER.

## 2019-05-13 NOTE — Telephone Encounter (Signed)
Pt states last BP she checked was at 3:10P and it was 150/80, HR 106.  She is concerned because her BP and HR have been fluctuating so much over the last couple of months.  States BP on 03/11/2019 was 158/125.  Pt says she has been having migraines and dizziness lately.  Scheduled pt to see Dr. Tamala Julian on Monday.  Recommended she go to [facility] and have them check her BP and see if it is consistent with her machine.  Also advised I will send message to Dr. Tamala Julian for review, but to also call the on call service over the weekend if BPs remain higher than 140/90, especially is DBP over 100.  Pt verbalized understanding and was in agreement with this plan.

## 2019-05-15 NOTE — Progress Notes (Signed)
Cardiology Office Note:    Date:  05/16/2019   ID:  Cynthia Gilmore, DOB November 08, 1934, MRN [id number]  PCP:  Aurea Graff.Marlou Sa, MD  Cardiologist:  Sinclair Grooms, MD   Referring MD: Aurea Graff.Marlou Sa, MD [place] Chief Complaint  Patient presents with  . Cardiac Valve Problem    History of Present Illness:    Cynthia Gilmore is a 85 y.o. female with a hx of subvalvular aortic stenosis/subvalvular LV outflow obstruction, anxiety disorder, hypertension, and type 2 diabetes.  Also has a longstanding history of intermittent chest discomfort.  She is very depressed.  She is concerned about her husband's health.  She called yesterday because she was having palpitations and has noted for several weeks and her blood pressure is elevated and fluctuates wildly.  She is not having syncope but has had lightheadedness.  The palpitations are the sensation that her heart takes off and starts racing and beating hard.  There is no associated chest pain.  Reviewed blood pressure recordings from home tend to run 25/70 6-1 168/100 mmHg with heartbeats that time as high as 120.  The average blood pressure appears to be in the 145 to 174 mmHg systolic range.  Past Medical History:  Diagnosis Date  . Allergy   . Anxiety   . Anxiety and depression    ? bipolar  . Cataract    bil cataracts removed  . Depression   . Diabetes mellitus, type 2 ([place])   . GERD (gastroesophageal reflux disease)   . Heart murmur   . Hemorrhoids 2012   . Hiatal hernia   . Hypertension   . IBS (irritable bowel syndrome)   . Lymphocytic colitis 2012  . Mumerous medication allergies and sensitivities 07/20/2015  . Osteoarthritis (arthritis due to wear and tear of joints)   . Segmental colitis ([place])    iscemic colitis 2009  . Stroke [facility])    states 2 "'mini strokes" 1980  . TIA (transient ischemic attack) 2000   x 3     Past Surgical History:  Procedure Laterality Date  . ANKLE SURGERY  1999   right ankle fracture  surgery  . BLADDER REPAIR  1995  . CATARACT EXTRACTION, BILATERAL    . CHOLECYSTECTOMY  2004  . COLONOSCOPY W/ BIOPSIES AND POLYPECTOMY   07/29/2011   internal hemorrhoids, lymphocytic colitis  . ESOPHAGEAL MANOMETRY     Normal  . ESOPHAGOGASTRODUODENOSCOPY  07/29/2011   gastritis,hiatal hernia, tortuous esophagus  . EUS  06/03/2012   Procedure: UPPER ENDOSCOPIC ULTRASOUND (EUS) LINEAR;  Surgeon: Milus Banister, MD;  Location: [facility] END[facility]COPY;  Service: Endoscopy;  Laterality: N/A;  . HERNIA REPAIR  1995  . PARTIAL HYSTERECTOMY  1992   partial    Current Medications: Current Meds  Medication Sig  . ALPRAZolam (XANAX) 0.25 MG tablet TAKE 1/2 TO 1 TABLET BY MOUTH ONCE DAILY AS NEEDED  . anti-nausea (EMETROL) solution Take 5 mLs by mouth as needed for nausea or vomiting.  . Ascorbic Acid (VITAMIN C) 500 MG CAPS Take by mouth.  . famotidine (PEPCID) 40 MG tablet Take 1 tablet (40 mg total) by mouth 2 (two) times daily.  Marland Kitchen glucose blood (ACCU-CHEK SMARTVIEW) test strip 1 each by Other route daily.  . hyoscyamine (ANASPAZ) 0.125 MG TBDP disintergrating tablet Place 1 tablet (0.125 mg total) under the tongue every 4 (four) hours as needed for cramping.  Marland Kitchen ibuprofen (ADVIL,MOTRIN) 200 MG tablet Take 200 mg by mouth every 6 (six) hours  as needed.  Marland Kitchen losartan (COZAAR) 100 MG tablet Take 100 mg by mouth daily.  . metFORMIN (GLUCOPHAGE-XR) 500 MG 24 hr tablet TAKE 1 TABLET(500 MG) BY MOUTH TWICE DAILY WITH A MEAL  . metoprolol succinate (TOPROL-XL) 50 MG 24 hr tablet TAKE 1 TABLET BY MOUTH TWICE DAILY. TAKE WITH OR IMMEDIATELY FOLLOWING A MEAL  . Misc Natural Products (ESTROVEN ENERGY PO) Take by mouth.  . ondansetron (ZOFRAN) 4 MG tablet TAKE 1 TABLET BY MOUTH FOUR TIMES DAILY( BEFORE A MEAL AND EVERY NIGHT AT BEDTIME)  . spironolactone (ALDACTONE) 25 MG tablet Take 1 tablet (25 mg total) by mouth daily.     Allergies:   Alprazolam, Amlodipine besylate, Aripiprazole, Augmentin [amoxicillin-pot  clavulanate], Azithromycin, Bupropion hcl, Cefaclor, Celecoxib, Cephalexin, Ciprofloxacin, Clonidine hydrochloride, Desvenlafaxine, Diphenoxylate-atropine, Divalproex sodium, Doxycycline, Entex, Est estrogens-methyltest, Fluoxetine hcl, Furosemide, Hydrochlorothiazide, Hydrocodone-acetaminophen, Lamotrigine, Lansoprazole, Lisinopril, Lorazepam, Meloxicam, Methylphenidate hcl, Metronidazole, Mirtazapine, Nabumetone, Nitrofuran derivatives, Olmesartan medoxomil, Omeprazole, Other, Oxycodone-acetaminophen, Pantoprazole sodium, Paroxetine, Quetiapine, Rabeprazole sodium, Rofecoxib, Sertraline hcl, Sumatriptan, Telithromycin, Topamax, Tramadol-acetaminophen, Trintellix [vortioxetine], Venlafaxine, Verapamil, and Vilazodone hcl [vilazodone hcl]   Social History   Socioeconomic History  . Marital status: Married    Spouse name: Not on file  . Number of children: 2  . Years of education: Not on file  . Highest education level: Not on file  Occupational History  . Occupation: Retired    Fish farm manager: RETIRED  Social Needs  . Financial resource strain: Not on file  . Food insecurity    Worry: Not on file    Inability: Not on file  . Transportation needs    Medical: Not on file    Non-medical: Not on file  Tobacco Use  . Smoking status: Former Smoker    Types: Cigarettes  . Smokeless tobacco: Never Used  . Tobacco comment: stopped in 1962  Substance and Sexual Activity  . Alcohol use: No    Alcohol/week: 0.0 standard drinks  . Drug use: No  . Sexual activity: Not on file  Lifestyle  . Physical activity    Days per week: Not on file    Minutes per session: Not on file  . Stress: Not on file  Relationships  . Social Herbalist on phone: Not on file    Gets together: Not on file    Attends religious service: Not on file    Active member of club or organization: Not on file    Attends meetings of clubs or organizations: Not on file    Relationship status: Not on file  Other Topics  Concern  . Not on file  Social History Narrative   Occupation: retired   Patient is a former smoker. -stopped 1962   Alcohol Use - no     Illicit Drug Use - no         Daily Caffeine Use  -1 cup daily           Position roster   Cardiologist-Dr. Tamala Julian   GYN-Dr. Corinna Capra   Ophthalmologist-Dr. Katy Fitch   Gastroenterologist-Dr. Carlean Purl     Family History: The patient's family history includes Diabetes in her maternal aunt, maternal uncle, and mother; Heart disease in her father and paternal aunt. There is no history of Colon cancer.  ROS:   Please see the history of present illness.    She has poor appetite for 3 months.  She feels depressed all the time.  She is on Xanax and wonders if this will help her.  She has  multiple allergies and intolerances to other medications.  She is worried about her husband he had bypass surgery and is not doing well now 3 years post surgery.  All other systems reviewed and are negative.  EKGs/Labs/Other Studies Reviewed:    The following studies were reviewed today: No recent imaging or functional data  EKG:  EKG not performed  Recent Labs: No results found for requested labs within last 8760 hours.  Recent Lipid Panel    Component Value Date/Time   CHOL 196 03/24/2017 1048   TRIG 209.0 (H) 03/24/2017 1048   HDL 49.40 03/24/2017 1048   CHOLHDL 4 03/24/2017 1048   VLDL 41.8 (H) 03/24/2017 1048   LDLCALC 98 12/19/2016 1457   LDLDIRECT 124.0 03/24/2017 1048   Most recent laboratory data from March 2020: LDL cholesterol 105, hemoglobin A1c 6.2, potassium 4.8, creatinine less than 1.0. Physical Exam:    VS:  BP 114/62   Pulse 67   Ht 5' 5.5" (1.664 m)   Wt 159 lb 3.2 oz (72.2 kg)   SpO2 99%   BMI 26.09 kg/m     Wt Readings from Last 3 Encounters:  05/16/19 159 lb 3.2 oz (72.2 kg)  01/18/19 161 lb 9.6 oz (73.3 kg)  09/17/18 165 lb (74.8 kg)     GEN: Elderly.. No acute distress HEENT: Normal NECK: No JVD. LYMPHATICS: No lymphadenopathy  CARDIAC: RRR.  2/6 crescendo decrescendo systolic murmur, S4 gallop gallop, no edema VASCULAR: 2+ bilateral radial and carotid pulses, no bruits RESPIRATORY:  Clear to auscultation without rales, wheezing or rhonchi  ABDOMEN: Soft, non-tender, non-distended, No pulsatile mass, MUSCULOSKELETAL: No deformity  SKIN: Warm and dry NEUROLOGIC:  Alert and oriented x 3 PSYCHIATRIC:  Normal affect   ASSESSMENT:    1. Subvalvular aortic stenosis   2. Essential hypertension   3. Hyperlipidemia with target LDL less than 70   4. Palpitation   5. Educated About Covid-19 Virus Infection    PLAN:    In order of problems listed above:  1. Murmur is unchanged 2. Increase spironolactone to 37.5 mg daily.  We will check a basic metabolic panel in 1 week.  Target blood pressure 140/80 mmHg.  Next move will be to increase metoprolol if needed. 3. Target LDL should be less than 70 4. 2-week monitor to exclude atrial fibrillation or other significant arrhythmia.  Plan clinical follow-up after the monitor.  She will continue to monitor her blood pressure.  If blood work remains acceptable hopefully the higher dose of Aldactone will bring down her blood pressures.  If she cannot tolerate the higher dose of Aldactone, will need to increase metoprolol succinate to 150 or 200 mg/day.  If we increase metoprolol, this should be informed of the results of the monitor that she will wear.  We have limitations in medication choices because of significant intolerances with 49 noted issues.   Medication Adjustments/Labs and Tests Ordered: Current medicines are reviewed at length with the patient today.  Concerns regarding medicines are outlined above.  No orders of the defined types were placed in this encounter.  No orders of the defined types were placed in this encounter.   There are no Patient Instructions on file for this visit.   Signed, Sinclair Grooms, MD  05/16/2019 2:10 PM    [place]

## 2019-05-16 ENCOUNTER — Other Ambulatory Visit: Payer: Self-pay

## 2019-05-16 ENCOUNTER — Encounter: Payer: Self-pay | Admitting: Interventional Cardiology

## 2019-05-16 ENCOUNTER — Ambulatory Visit: Payer: Medicare Other | Admitting: Interventional Cardiology

## 2019-05-16 VITALS — BP 158/82 | HR 67 | Ht 65.5 in | Wt 159.2 lb

## 2019-05-16 DIAGNOSIS — Z7189 Other specified counseling: Secondary | ICD-10-CM

## 2019-05-16 DIAGNOSIS — R002 Palpitations: Secondary | ICD-10-CM | POA: Diagnosis not present

## 2019-05-16 DIAGNOSIS — Q244 Congenital subaortic stenosis: Secondary | ICD-10-CM

## 2019-05-16 DIAGNOSIS — E785 Hyperlipidemia, unspecified: Secondary | ICD-10-CM | POA: Diagnosis not present

## 2019-05-16 DIAGNOSIS — I10 Essential (primary) hypertension: Secondary | ICD-10-CM | POA: Diagnosis not present

## 2019-05-16 MED ORDER — SPIRONOLACTONE 25 MG PO TABS: 37.5000 mg | ORAL_TABLET | Freq: Every day | ORAL | 3 refills | Status: DC

## 2019-05-16 NOTE — Patient Instructions (Signed)
Medication Instructions:  1) INCREASE Spironolactone to 1.5 tabs (37.5mg ) once daily  If you need a refill on your cardiac medications before your next appointment, please call your pharmacy.   Lab work: Your physician recommends that you return for lab work in: 1 week (BMET)  If you have labs (blood work) drawn today and your tests are completely normal, you will receive your results only by: Marland Kitchen MyChart Message (if you have MyChart) OR . A paper copy in the mail If you have any lab test that is abnormal or we need to change your treatment, we will call you to review the results.  Testing/Procedures: Your physician has recommended that you wear an event monitor. Event monitors are medical devices that record the heart's electrical activity. Doctors most often [place] these monitors to diagnose arrhythmias. Arrhythmias are problems with the speed or rhythm of the heartbeat. The monitor is a small, portable device. You can wear one while you do your normal daily activities. This is usually used to diagnose what is causing palpitations/syncope (passing out).   Follow-Up: Your physician recommends that you schedule a follow-up appointment in: 4-6 weeks with a NP on our team.    Any Other Special Instructions Will Be Listed Below (If Applicable).  Please monitor your blood pressure daily, at least two hours after your medication.  Contact the office with those readings in a week.  Our goal is for your blood pressure to be 140/80 or lower.

## 2019-05-19 ENCOUNTER — Telehealth: Payer: Self-pay | Admitting: Interventional Cardiology

## 2019-05-19 NOTE — Telephone Encounter (Signed)
Spoke with pt and she states she has no energy and she has been running to the bathroom urinating since we increased dose.  Advised pt that we wanted to get fluid off of her to help pull down the BP and the Arlyce Harman is what is causing that.  BPs have been 118-119/60-70.  Advised pt not unusual to have some fatigue/no energy with dose changes in this medication and should subside after being on the med about a week.  Advised if no better by Monday, call the office back and let us know.  Pt appreciative for call.

## 2019-05-19 NOTE — Telephone Encounter (Signed)
New message:    Patient calling concerning a medication change. Patient states that she has no energy. The name of the medication is Spironolactone.

## 2019-05-25 ENCOUNTER — Telehealth: Payer: Self-pay | Admitting: *Deleted

## 2019-05-25 ENCOUNTER — Other Ambulatory Visit: Payer: Medicare Other

## 2019-05-25 ENCOUNTER — Other Ambulatory Visit: Payer: Self-pay

## 2019-05-25 DIAGNOSIS — I10 Essential (primary) hypertension: Secondary | ICD-10-CM

## 2019-05-25 NOTE — Telephone Encounter (Signed)
A 14 day ZIO XT long term holter monitor will be mailed to patients home.  Instructions reviewed briefly as they are included in the monitor kit.

## 2019-05-26 LAB — BASIC METABOLIC PANEL
BUN/Creatinine Ratio: 23 (ref 12–28)
BUN: 17 mg/dL (ref 8–27)
CO2: 22 mmol/L (ref 20–29)
Calcium: 9.9 mg/dL (ref 8.7–10.3)
Chloride: 99 mmol/L (ref 96–106)
Creatinine, Ser: 0.75 mg/dL (ref 0.57–1.00)
GFR calc Af Amer: 85 mL/min/{1.73_m2} (ref >59)
GFR calc non Af Amer: 74 mL/min/{1.73_m2} (ref >59)
Glucose: 94 mg/dL (ref 65–99)
Potassium: 4.4 mmol/L (ref 3.5–5.2)
Sodium: 136 mmol/L (ref 134–144)

## 2019-05-31 ENCOUNTER — Ambulatory Visit (INDEPENDENT_AMBULATORY_CARE_PROVIDER_SITE_OTHER): Payer: Medicare Other

## 2019-05-31 DIAGNOSIS — R002 Palpitations: Secondary | ICD-10-CM

## 2019-06-20 ENCOUNTER — Telehealth: Payer: Self-pay | Admitting: Nurse Practitioner

## 2019-06-20 NOTE — Telephone Encounter (Signed)
New Message         COVID-19 Pre-Screening Questions:   In the past 7 to 10 days have you had a cough,  shortness of breath, headache, congestion, fever (100 or greater) body aches, chills, sore throat, or sudden loss of taste or sense of smell? Cough she has had for over a year   Have you been around anyone with known Covid 19.  NO  Have you been around anyone who is awaiting Covid 19 test results in the past 7 to 10 days? NO Have you been around anyone who has been exposed to Covid 19, or has mentioned symptoms of Covid 19 within the past 7 to 10 days?        NO   If you have any concerns/questions about symptoms patients report duringscreening (either on the phone or at threshold). Contact the provider seeing the patient or DOD for further guidance.  If neither are available contact a member of the leadership team.

## 2019-06-20 NOTE — Progress Notes (Deleted)
CARDIOLOGY OFFICE NOTE  Date:  06/20/2019    Cynthia Gilmore Date of Birth: Aug 27, 1934 Medical Record #[id number]  PCP:  Alroy Dust, L.Marlou Sa, MD  Cardiologist:  Tamala Julian    No chief complaint on file.   History of Present Illness: Cynthia Gilmore is a 85 y.o. female who presents today for a follow up visit. Seen for Dr. Tamala Julian.   She has a history of subvalvular aortic stenosis/subvalvular LV outflow obstruction, anxiety disorder, hypertension, and type 2 diabetes. Also has a longstanding history of intermittent chest discomfort.  She was seen by Dr. Tamala Julian last month - noted to be very depressed. Upset over her husband's health. Having palpitations. Monitor was ordered. BP and HR labile.   The patient {does/does not:200015} have symptoms concerning for COVID-19 infection (fever, chills, cough, or new shortness of breath).   Comes in today. Here with   Past Medical History:  Diagnosis Date  . Allergy   . Anxiety   . Anxiety and depression    ? bipolar  . Cataract    bil cataracts removed  . Depression   . Diabetes mellitus, type 2 ([place])   . GERD (gastroesophageal reflux disease)   . Heart murmur   . Hemorrhoids 2012   . Hiatal hernia   . Hypertension   . IBS (irritable bowel syndrome)   . Lymphocytic colitis 2012  . Mumerous medication allergies and sensitivities 07/20/2015  . Osteoarthritis (arthritis due to wear and tear of joints)   . Segmental colitis ([place])    iscemic colitis 2009  . Stroke [facility])    states 2 "'mini strokes" 1980  . TIA (transient ischemic attack) 2000   x 3     Past Surgical History:  Procedure Laterality Date  . ANKLE SURGERY  1999   right ankle fracture surgery  . BLADDER REPAIR  1995  . CATARACT EXTRACTION, BILATERAL    . CHOLECYSTECTOMY  2004  . COLONOSCOPY W/ BIOPSIES AND POLYPECTOMY   07/29/2011   internal hemorrhoids, lymphocytic colitis  . ESOPHAGEAL MANOMETRY     Normal  . ESOPHAGOGASTRODUODENOSCOPY  07/29/2011   gastritis,hiatal hernia, tortuous esophagus  . EUS  06/03/2012   Procedure: UPPER ENDOSCOPIC ULTRASOUND (EUS) LINEAR;  Surgeon: Milus Banister, MD;  Location: [facility];  Service: Endoscopy;  Laterality: N/A;  . HERNIA REPAIR  1995  . PARTIAL HYSTERECTOMY  1992   partial     Medications: No outpatient medications have been marked as taking for the 06/21/19 encounter (Appointment) with Burtis Junes, NP.     Allergies: Allergies  Allergen Reactions  . Alprazolam     REACTION: sleepy  . Amlodipine Besylate     REACTION: swelling  . Aripiprazole   . Augmentin [Amoxicillin-Pot Clavulanate] Diarrhea  . Azithromycin   . Bupropion Hcl     REACTION: insomnia  . Cefaclor     REACTION: mouth blisters  . Celecoxib     REACTION: "drained"  . Cephalexin   . Ciprofloxacin   . Clonidine Hydrochloride     REACTION: headache, dizziness  . Desvenlafaxine   . Diphenoxylate-Atropine   . Divalproex Sodium     REACTION: nausea,tremors,fatigue  . Doxycycline   . Entex     REACTION: nausea, headache  . Est Estrogens-Methyltest     REACTION: headache  . Fluoxetine Hcl   . Furosemide   . Hydrochlorothiazide   . Hydrocodone-Acetaminophen     REACTION: headache  . Lamotrigine     REACTION: mood  swing,chills, nausea  . Lansoprazole   . Lisinopril     REACTION: cough  . Lorazepam   . Meloxicam     REACTION: nausea, diarrhea  . Methylphenidate Hcl   . Metronidazole   . Mirtazapine     REACTION: swelling  . Nabumetone     REACTION: nausea, headache  . Nitrofuran Derivatives Nausea Only  . Olmesartan Medoxomil   . Omeprazole Diarrhea  . Other     Alum and Mag Hydroxide Simeth(Solution) GI Cocktail. Diarrhea   . Oxycodone-Acetaminophen   . Pantoprazole Sodium   . Paroxetine     REACTION: tremor, insomnia, nausea  . Quetiapine   . Rabeprazole Sodium     REACTION: diarrhea  . Rofecoxib   . Sertraline Hcl     REACTION: exreme confusion  . Sumatriptan     REACTION:  cardiologist says "no"  . Telithromycin     REACTION: "drained"  . Topamax     Confusion and mood swings  . Tramadol-Acetaminophen   . Trintellix [Vortioxetine] Other (See Comments)    "Made things worst"  . Venlafaxine     REACTION: hypertension, nausea, headache  . Verapamil     Headaches and hot flashes   . Vilazodone Hcl [Vilazodone Hcl]     Social History: The patient  reports that she has quit smoking. Her smoking use included cigarettes. She has never used smokeless tobacco. She reports that she does not drink alcohol or use drugs.   Family History: The patient's ***family history includes Diabetes in her maternal aunt, maternal uncle, and mother; Heart disease in her father and paternal aunt.   Review of Systems: Please see the history of present illness.   All other systems are reviewed and negative.   Physical Exam: VS:  There were no vitals taken for this visit. Marland Kitchen  BMI There is no height or weight on file to calculate BMI.  Wt Readings from Last 3 Encounters:  05/16/19 159 lb 3.2 oz (72.2 kg)  01/18/19 161 lb 9.6 oz (73.3 kg)  09/17/18 165 lb (74.8 kg)    General: Pleasant. Well developed, well nourished and in no acute distress.   HEENT: Normal.  Neck: Supple, no JVD, carotid bruits, or masses noted.  Cardiac: ***Regular rate and rhythm. No murmurs, rubs, or gallops. No edema.  Respiratory:  Lungs are clear to auscultation bilaterally with normal work of breathing.  GI: Soft and nontender.  MS: No deformity or atrophy. Gait and ROM intact.  Skin: Warm and dry. Color is normal.  Neuro:  Strength and sensation are intact and no gross focal deficits noted.  Psych: Alert, appropriate and with normal affect.   LABORATORY DATA:  EKG:  EKG {ACTION; IS/IS WUJ:[id number]} ordered today. This demonstrates ***.  Lab Results  Component Value Date   WBC 11.7 (H) 12/15/2017   HGB 14.3 01/17/2018   HCT 42.0 01/17/2018   PLT 359 12/15/2017   GLUCOSE 94 05/25/2019    CHOL 196 03/24/2017   TRIG 209.0 (H) 03/24/2017   HDL 49.40 03/24/2017   LDLDIRECT 124.0 03/24/2017   LDLCALC 98 12/19/2016   ALT 17 03/24/2017   AST 19 03/24/2017   NA 136 05/25/2019   K 4.4 05/25/2019   CL 99 05/25/2019   CREATININE 0.75 05/25/2019   BUN 17 05/25/2019   CO2 22 05/25/2019   TSH 1.230 12/15/2017   INR 0.9 10/11/2007   HGBA1C 6.2 03/24/2017   MICROALBUR <0.7 06/06/2016     BNP (last 3 results)  No results for input(s): BNP in the last 8760 hours.  ProBNP (last 3 results) No results for input(s): PROBNP in the last 8760 hours.   Other Studies Reviewed Today:   Assessment/Plan:  1. Subvalvular AS  2. Palpitations  3. HTN  4. Depression  5. HLD  In order of problems listed above:  1. Murmur is unchanged 2. Increase spironolactone to 37.5 mg daily.  We will check a basic metabolic panel in 1 week.  Target blood pressure 140/80 mmHg.  Next move will be to increase metoprolol if needed. 3. Target LDL should be less than 70 4. 2-week monitor to exclude atrial fibrillation or other significant arrhythmia.  Plan clinical follow-up after the monitor.  She will continue to monitor her blood pressure.  If blood work remains acceptable hopefully the higher dose of Aldactone will bring down her blood pressures.  If she cannot tolerate the higher dose of Aldactone, will need to increase metoprolol succinate to 150 or 200 mg/day.  If we increase metoprolol, this should be informed of the results of the monitor that she will wear.  We have limitations in medication choices because of significant intolerances with 49 noted issues.  Marland Kitchen COVID-19 Education: The signs and symptoms of COVID-19 were discussed with the patient and how to seek care for testing (follow up with PCP or arrange E-visit).  The importance of social distancing, staying at home, hand hygiene and wearing a mask when out in public were discussed today.  Current medicines are reviewed with the  patient today.  The patient does not have concerns regarding medicines other than what has been noted above.  The following changes have been made:  See above.  Labs/ tests ordered today include:   No orders of the defined types were placed in this encounter.    Disposition:   FU with *** in {gen number 1-44:818563} {Days to years:10300}.   Patient is agreeable to this plan and will call if any problems develop in the interim.   SignedTruitt Merle, NP  06/20/2019 12:29 PM  [place] [address] [place], [place]  [address] Phone: [phone] Fax: [phone]

## 2019-06-21 ENCOUNTER — Ambulatory Visit: Payer: Medicare Other | Admitting: Nurse Practitioner

## 2019-06-21 NOTE — Telephone Encounter (Signed)
Call placed to pt re: covid19 screening.  Left a detailed message for pt that it is ok for her to come on in to her appoinment.

## 2019-06-21 NOTE — Telephone Encounter (Signed)
Ok to be seen.   Burtis Junes, RN, [place] [address] [place], [place]  [address] [phone]

## 2019-06-27 ENCOUNTER — Telehealth: Payer: Self-pay | Admitting: *Deleted

## 2019-06-27 DIAGNOSIS — I471 Supraventricular tachycardia: Secondary | ICD-10-CM

## 2019-06-27 MED ORDER — METOPROLOL SUCCINATE ER 25 MG PO TB24: 25.0000 mg | ORAL_TABLET | Freq: Every day | ORAL | 3 refills | Status: DC

## 2019-06-27 MED ORDER — METOPROLOL SUCCINATE ER 50 MG PO TB24: ORAL_TABLET | ORAL | 3 refills | Status: DC

## 2019-06-27 NOTE — Telephone Encounter (Signed)
Spoke with pt and went over recommendations per Dr. Tamala Julian.  Pt will come in to see Cecilie Kicks, NP on 8/11 and have EKG and labs performed.  Pt verbalized understanding and was in agreement with plan.

## 2019-06-27 NOTE — Telephone Encounter (Signed)
-----   Message from Belva Crome, MD sent at 06/24/2019  7:18 PM EDT ----- Increase Metoprolol succinate as recommended and monitor HR and BP. ECG 1 week.Also needs CBC, CMET and TSH

## 2019-07-05 ENCOUNTER — Other Ambulatory Visit: Payer: Medicare Other

## 2019-07-05 ENCOUNTER — Ambulatory Visit (INDEPENDENT_AMBULATORY_CARE_PROVIDER_SITE_OTHER): Payer: Medicare Other | Admitting: Cardiology

## 2019-07-05 ENCOUNTER — Encounter: Payer: Self-pay | Admitting: Cardiology

## 2019-07-05 ENCOUNTER — Other Ambulatory Visit: Payer: Self-pay

## 2019-07-05 VITALS — BP 138/62 | HR 66 | Ht 65.5 in | Wt 157.4 lb

## 2019-07-05 DIAGNOSIS — Q244 Congenital subaortic stenosis: Secondary | ICD-10-CM | POA: Diagnosis not present

## 2019-07-05 DIAGNOSIS — R002 Palpitations: Secondary | ICD-10-CM | POA: Diagnosis not present

## 2019-07-05 DIAGNOSIS — I10 Essential (primary) hypertension: Secondary | ICD-10-CM

## 2019-07-05 DIAGNOSIS — I471 Supraventricular tachycardia: Secondary | ICD-10-CM

## 2019-07-05 MED ORDER — METOPROLOL SUCCINATE ER 25 MG PO TB24: 25.0000 mg | ORAL_TABLET | Freq: Two times a day (BID) | ORAL | 11 refills | Status: DC

## 2019-07-05 NOTE — Progress Notes (Addendum)
Cardiology Office Note   Date:  07/05/2019   ID:  Cynthia Gilmore, DOB July 13, 1934, MRN [id number]  PCP:  Aurea Graff.Marlou Sa, MD  Cardiologist:  Dr. Tamala Julian     Chief Complaint  Patient presents with  . Palpitations      History of Present Illness: Cynthia Gilmore is a 85 y.o. female who presents for EKG and follow up after medication increase.    Recent phone instruction with increase of metoprolol and monitor HR and BP, to have EKG today and needs CBC, CMET and TSH. This was done for rapid HR on monitor.   Cynthia Gilmore is a 85 y.o. female with a hx of subvalvular aortic stenosis/subvalvular LV outflow obstruction, anxiety disorder, hypertension, and type 2 diabetes. Also has a longstanding history of intermittent chest discomfort.  She is very depressed.  She is concerned about her husband's health.  She called yesterday because she was having palpitations and has noted for several weeks and her blood pressure is elevated and fluctuates wildly.  She is not having syncope but has had lightheadedness.  The palpitations are the sensation that her heart takes off and starts racing and beating hard.  There is no associated chest pain.  Last visit 05/16/19 increased spironolactone to 37.5 daily,  Target BP 140/80  Reviewed blood pressure recordings from home tend to run 25/70 6-1 168/100 mmHg with heartbeats that time as high as 120.  The average blood pressure appears to be in the 145 to 947 mmHg systolic range. Monitor ordered for palpitations and found NSWCT up to 12 seconds, and nonsustained SVT.  meds adjusted.   Her toprol increased to 75 mg BID.    Today no chest pain and no SOB.  She was concerned about HR in 50s - reassured.  Discussed HR in 50s is OK if she feels ok without dizziness, that this is to prevent rapid HR.  Less rapid HR.   Past Medical History:  Diagnosis Date  . Allergy   . Anxiety   . Anxiety and depression    ? bipolar  . Cataract    bil cataracts  removed  . Depression   . Diabetes mellitus, type 2 ([place])   . GERD (gastroesophageal reflux disease)   . Heart murmur   . Hemorrhoids 2012   . Hiatal hernia   . Hypertension   . IBS (irritable bowel syndrome)   . Lymphocytic colitis 2012  . Mumerous medication allergies and sensitivities 07/20/2015  . Osteoarthritis (arthritis due to wear and tear of joints)   . Segmental colitis ([place])    iscemic colitis 2009  . Stroke [facility])    states 2 "'mini strokes" 1980  . TIA (transient ischemic attack) 2000   x 3     Past Surgical History:  Procedure Laterality Date  . ANKLE SURGERY  1999   right ankle fracture surgery  . BLADDER REPAIR  1995  . CATARACT EXTRACTION, BILATERAL    . CHOLECYSTECTOMY  2004  . COLONOSCOPY W/ BIOPSIES AND POLYPECTOMY   07/29/2011   internal hemorrhoids, lymphocytic colitis  . ESOPHAGEAL MANOMETRY     Normal  . ESOPHAGOGASTRODUODENOSCOPY  07/29/2011   gastritis,hiatal hernia, tortuous esophagus  . EUS  06/03/2012   Procedure: UPPER ENDOSCOPIC ULTRASOUND (EUS) LINEAR;  Surgeon: Milus Banister, MD;  Location: [facility] ENDOSCOPY;  Service: Endoscopy;  Laterality: N/A;  . HERNIA REPAIR  1995  . PARTIAL HYSTERECTOMY  1992   partial  Current Outpatient Medications  Medication Sig Dispense Refill  . ALPRAZolam (XANAX) 0.25 MG tablet TAKE 1/2 TO 1 TABLET BY MOUTH ONCE DAILY AS NEEDED 30 tablet 0  . anti-nausea (EMETROL) solution Take 5 mLs by mouth as needed for nausea or vomiting.    . Ascorbic Acid (VITAMIN C) 500 MG CAPS Take by mouth.    . famotidine (PEPCID) 40 MG tablet Take 1 tablet (40 mg total) by mouth 2 (two) times daily. 180 tablet 3  . glucose blood (ACCU-CHEK SMARTVIEW) test strip 1 each by Other route daily. 100 each 12  . hyoscyamine (ANASPAZ) 0.125 MG TBDP disintergrating tablet Place 1 tablet (0.125 mg total) under the tongue every 4 (four) hours as needed for cramping. 90 tablet 5  . ibuprofen (ADVIL,MOTRIN) 200 MG tablet Take 200 mg by mouth every  6 (six) hours as needed.    Marland Kitchen losartan (COZAAR) 100 MG tablet Take 100 mg by mouth daily.    . metFORMIN (GLUCOPHAGE-XR) 500 MG 24 hr tablet TAKE 1 TABLET(500 MG) BY MOUTH TWICE DAILY WITH A MEAL 180 tablet 0  . metoprolol succinate (TOPROL-XL) 25 MG 24 hr tablet Take 1 tablet (25 mg total) by mouth daily. In addition to your 50mg  tablet to total 75mg  daily 90 tablet 3  . metoprolol succinate (TOPROL-XL) 50 MG 24 hr tablet TAKE 1 TABLET BY MOUTH TWICE DAILY (take in addition to the 25mg  tablet to total 75mg  twice daily). TAKE WITH OR IMMEDIATELY FOLLOWING A MEAL 180 tablet 3  . Misc Natural Products (ESTROVEN ENERGY PO) Take by mouth.    Marland Kitchen omeprazole (PRILOSEC) 20 MG capsule Take 20 mg by mouth daily as needed.    . ondansetron (ZOFRAN) 4 MG tablet TAKE 1 TABLET BY MOUTH FOUR TIMES DAILY( BEFORE A MEAL AND EVERY NIGHT AT BEDTIME) 120 tablet 11  . spironolactone (ALDACTONE) 25 MG tablet Take 1.5 tablets (37.5 mg total) by mouth daily. 135 tablet 3   No current facility-administered medications for this visit.     Allergies:   Alprazolam, Amlodipine besylate, Aripiprazole, Augmentin [amoxicillin-pot clavulanate], Azithromycin, Bupropion hcl, Cefaclor, Celecoxib, Cephalexin, Ciprofloxacin, Clonidine hydrochloride, Desvenlafaxine, Diphenoxylate-atropine, Divalproex sodium, Doxycycline, Entex, Est estrogens-methyltest, Fluoxetine hcl, Furosemide, Hydrochlorothiazide, Hydrocodone-acetaminophen, Lamotrigine, Lansoprazole, Lisinopril, Lorazepam, Meloxicam, Methylphenidate hcl, Metronidazole, Mirtazapine, Nabumetone, Nitrofuran derivatives, Olmesartan medoxomil, Omeprazole, Other, Oxycodone-acetaminophen, Pantoprazole sodium, Paroxetine, Quetiapine, Rabeprazole sodium, Rofecoxib, Sertraline hcl, Sumatriptan, Telithromycin, Topamax, Tramadol-acetaminophen, Trintellix [vortioxetine], Venlafaxine, Verapamil, and Vilazodone hcl [vilazodone hcl]    Social History:  The patient  reports that she has quit smoking.  Her smoking use included cigarettes. She has never used smokeless tobacco. She reports that she does not drink alcohol or use drugs.   Family History:  The patient's family history includes Diabetes in her maternal aunt, maternal uncle, and mother; Heart disease in her father and paternal aunt.    ROS:  General:no colds or fevers, no weight changes Skin:no rashes or ulcers HEENT:no blurred vision, no congestion CV:see HPI PUL:see HPI GI:no diarrhea constipation or melena, no indigestion GU:no hematuria, no dysuria MS:no joint pain, no claudication Neuro:no syncope, no lightheadedness Endo:+ diabetes, no thyroid disease  Wt Readings from Last 3 Encounters:  07/05/19 157 lb 6.4 oz (71.4 kg)  05/16/19 159 lb 3.2 oz (72.2 kg)  01/18/19 161 lb 9.6 oz (73.3 kg)     PHYSICAL EXAM: VS:  BP 138/62   Pulse 66   Ht 5' 5.5" (1.664 m)   Wt 157 lb 6.4 oz (71.4 kg)   BMI 25.79 kg/m  ,  BMI Body mass index is 25.79 kg/m. General:Pleasant affect, NAD Skin:Warm and dry, brisk capillary refill HEENT:normocephalic, sclera clear, mucus membranes moist Heart:S1S2 RRR with 2/6 systolic murmur, no gallup, rub or click Lungs:clear without rales, rhonchi, or wheezes JYN:WGNF, non tender, + BS, do not palpate liver spleen or masses Ext:no lower ext edema, 2+ pedal pulses, 2+ radial pulses Neuro:alert and oriented X 3, MAE, follows commands, + facial symmetry    EKG:  EKG is ordered today. The ekg ordered today demonstrates SR with low voltage, RBBB to intraventricular conduction delay HR 66   Recent Labs: 05/25/2019: BUN 17; Creatinine, Ser 0.75; Potassium 4.4; Sodium 136    Lipid Panel    Component Value Date/Time   CHOL 196 03/24/2017 1048   TRIG 209.0 (H) 03/24/2017 1048   HDL 49.40 03/24/2017 1048   CHOLHDL 4 03/24/2017 1048   VLDL 41.8 (H) 03/24/2017 1048   LDLCALC 98 12/19/2016 1457   LDLDIRECT 124.0 03/24/2017 1048       Other studies Reviewed: Additional studies/ records  that were reviewed today include:  Monitor 06/23/19 . Study Highlights   NSR is basic rhythm  Non sustained WCT lasting up to 12 seconds with rate 154-170 bpm  Nonsustained SVT  PAC's and PVC's   Stress test 03/17/17  Nuclear stress EF: 81%.  Blood pressure demonstrated a normal response to exercise.  There was no ST segment deviation noted during stress.  Defect 1: There is a small defect of moderate severity present in the basal inferior location.  This is a low risk study.  The left ventricular ejection fraction is hyperdynamic (>65%).   Low risk stress nuclear study with mild inferobasal thinning; no significant ischemia (minimal reversibility felt unlikely to be significant); EF 81 with normal wall motion.   Echo 11/22/13 Study Conclusions   - Left ventricle: The cavity size was normal. There was mild  focal basal hypertrophy of the septum. Systolic function  was vigorous. The estimated ejection fraction was in the  range of 65% to 70%. Wall motion was normal; there were no  regional wall motion abnormalities.  - Aortic valve: Mild sub aortic valve gradient.  - Mitral valve: There was systolic anterior motion. Mild  regurgitation.   ASSESSMENT AND PLAN:  1.  Palpitation with NS WCT on monitor and non SVT.  BB increased to 75 mg BID, her BP is better controlled and Palpitations improved.   2.  HTN last visit her spironolactone increased to 37.5 daily. Will check Labs today.  Her target BP is 140/80 and her BP is better controlled on higher dose of BB.  Ranges 144 to 135 and even down to 621 systolic.  May need to decrease spironolactone back to 25.  She monitors her BP and will call if stays < 308 systolic  I checked with Dr. Tamala Julian and no change in meds currently.    3.   Subvalvular aortic stenosis. No changes in murmur    She will follow up with Dr. Tamala Julian in Oct.     Current medicines are reviewed with the patient today.  The patient Has no concerns  regarding medicines.  The following changes have been made:  See above Labs/ tests ordered today include:see above  Disposition:   FU:  see above  Signed, Cecilie Kicks, NP  07/05/2019 3:04 PM    [facility] [place], [place] [place], [place] Phone: [phone]; Fax: [phone]  [phone]  [phone] 

## 2019-07-05 NOTE — Patient Instructions (Addendum)
Medication Instructions:  Your physician recommends that you continue on your current medications as directed. Please refer to the Current Medication list given to you today.  If you need a refill on your cardiac medications before your next appointment, please call your pharmacy.   Lab work: TODAY:  CBC, BMET, LFT, & TSH   If you have labs (blood work) drawn today and your tests are completely normal, you will receive your results only by: Marland Kitchen MyChart Message (if you have MyChart) OR . A paper copy in the mail If you have any lab test that is abnormal or we need to change your treatment, we will call you to review the results.  Testing/Procedures: None ordered  Follow-Up: At [facility], you and your health needs are our priority.  As part of our continuing mission to provide you with exceptional heart care, we have created designated Provider Care Teams.  These Care Teams include your primary Cardiologist (physician) and Advanced Practice Providers (APPs -  Physician Assistants and Nurse Practitioners) who all work together to provide you with the care you need, when you need it. Marland Kitchen Keep your scheduled appointment with Dr. Tamala Julian in October  Any Other Special Instructions Will Be Listed Below (If Applicable).

## 2019-07-06 LAB — CBC
Hematocrit: 39.3 % (ref 34.0–46.6)
Hemoglobin: 13.1 g/dL (ref 11.1–15.9)
MCH: 29.2 pg (ref 26.6–33.0)
MCHC: 33.3 g/dL (ref 31.5–35.7)
MCV: 88 fL (ref 79–97)
Platelets: 272 10*3/uL (ref 150–450)
RBC: 4.49 x10E6/uL (ref 3.77–5.28)
RDW: 12.9 % (ref 11.7–15.4)
WBC: 8.9 10*3/uL (ref 3.4–10.8)

## 2019-07-06 LAB — HEPATIC FUNCTION PANEL
ALT: 12 IU/L (ref 0–32)
AST: 15 IU/L (ref 0–40)
Albumin: 4.4 g/dL (ref 3.6–4.6)
Alkaline Phosphatase: 47 IU/L (ref 39–117)
Bilirubin Total: <0.2 mg/dL (ref 0.0–1.2)
Bilirubin, Direct: 0.08 mg/dL (ref 0.00–0.40)
Total Protein: 6.6 g/dL (ref 6.0–8.5)

## 2019-07-06 LAB — BASIC METABOLIC PANEL
BUN/Creatinine Ratio: 19 (ref 12–28)
BUN: 13 mg/dL (ref 8–27)
CO2: 20 mmol/L (ref 20–29)
Calcium: 9.4 mg/dL (ref 8.7–10.3)
Chloride: 102 mmol/L (ref 96–106)
Creatinine, Ser: 0.69 mg/dL (ref 0.57–1.00)
GFR calc Af Amer: 93 mL/min/{1.73_m2} (ref >59)
GFR calc non Af Amer: 81 mL/min/{1.73_m2} (ref >59)
Glucose: 117 mg/dL — ABNORMAL HIGH (ref 65–99)
Potassium: 4.1 mmol/L (ref 3.5–5.2)
Sodium: 140 mmol/L (ref 134–144)

## 2019-07-06 LAB — TSH: TSH: 1.75 u[IU]/mL (ref 0.450–4.500)

## 2019-07-07 NOTE — Progress Notes (Signed)
I would leave the meds where they are with such good blood pressure.

## 2019-07-13 ENCOUNTER — Telehealth: Payer: Self-pay | Admitting: Interventional Cardiology

## 2019-07-13 NOTE — Telephone Encounter (Signed)
Pt states last week after seeing Cecilie Kicks, NP, she had a couple of terrible dizzy spells.  The first one she thought she was going to pass out but she didn't.  Feels fatigued.  Denies CP, SOB.  BP has been ranging from 112-130/57-67, HR 55-87.  Yesterday morning SBP was 141 but that's the first time that has happened since increasing meds.  This morning BP 117/57.  Palpitations have improved a lot since increasing Metoprolol.  Pt did see GYN yesterday and was dx with a bladder infection.  Advised I will send to Dr. Tamala Julian for review and advisement.

## 2019-07-13 NOTE — Telephone Encounter (Signed)
The message is noted.  I would continue watchful waiting.  Perhaps the adjustment and medication was causing some of the symptoms.  Hopefully she will regulate on the current dose.  Keep [place] posted over the next week or 2

## 2019-07-13 NOTE — Telephone Encounter (Signed)
Looks like Dr. Tamala Julian increased Metoprolol.

## 2019-07-13 NOTE — Telephone Encounter (Signed)
Spoke with pt and went over recommendations.  Pt verbalized understanding and was appreciative for call.  

## 2019-07-13 NOTE — Telephone Encounter (Signed)
New Message     Pt c/o medication issue:  1. Name of Medication: Metoprolol  2. How are you currently taking this medication (dosage and times per day)? 150mg  1 tablet twice a day  3. Are you having a reaction (difficulty breathing--STAT)? No  4. What is your medication issue? Since the medication dosage was increased patient states it's been making them feel sick and would like to discuss it with the nurse.

## 2019-09-05 NOTE — Progress Notes (Signed)
Cardiology Office Note:    Date:  09/06/2019   ID:  Cynthia Gilmore, DOB [birth year], MRN [id number]  PCP:  Aurea Graff.Marlou Sa, MD  Cardiologist:  Sinclair Grooms, MD   Referring MD: Aurea Graff.Marlou Sa, MD   Chief Complaint  Patient presents with  . Hospitalization Follow-up    psvt  . Hypertension    History of Present Illness:    Cynthia Gilmore is a 85 y.o. female with a hx of subvalvular aortic stenosis / subvalvular LV outflow obstruction, anxiety disorder, hypertension, and type 2 diabetes mellitus. Also has a longstanding history of intermittent chest discomfort.  Rapid heart beating has improved since uptitrating metoprolol succinate to 75 mg/day.  There is no orthopnea or PND.  She is having no medication side effects.  She denies chest pain.  She has not had dizziness, or syncope.  She recorded heart rates and blood pressures since the medication titration and generally speaking the blood pressures have been less than 130/80 mmHg with lowest recording being 115/52 mmHg.  Heart rates have all ranged between 58 and 80 bpm.  No heart rates greater than 180 beats per minute..   Past Medical History:  Diagnosis Date  . Allergy   . Anxiety   . Anxiety and depression    ? bipolar  . Cataract    bil cataracts removed  . Depression   . Diabetes mellitus, type 2 ([place])   . GERD (gastroesophageal reflux disease)   . Heart murmur   . Hemorrhoids 2012   . Hiatal hernia   . Hypertension   . IBS (irritable bowel syndrome)   . Lymphocytic colitis 2012  . Mumerous medication allergies and sensitivities 07/20/2015  . Osteoarthritis (arthritis due to wear and tear of joints)   . Segmental colitis ([place])    iscemic colitis 2009  . Stroke [facility])    states 2 "'mini strokes" 1980  . TIA (transient ischemic attack) 2000   x 3     Past Surgical History:  Procedure Laterality Date  . ANKLE SURGERY  1999   right ankle fracture surgery  . BLADDER REPAIR  1995  . CATARACT  EXTRACTION, BILATERAL    . CHOLECYSTECTOMY  2004  . COLONOSCOPY W/ BIOPSIES AND POLYPECTOMY   07/29/2011   internal hemorrhoids, lymphocytic colitis  . ESOPHAGEAL MANOMETRY     Normal  . ESOPHAGOGASTRODUODENOSCOPY  07/29/2011   gastritis,hiatal hernia, tortuous esophagus  . EUS  06/03/2012   Procedure: UPPER ENDOSCOPIC ULTRASOUND (EUS) LINEAR;  Surgeon: Milus Banister, MD;  Location: [facility] END[facility]COPY;  Service: Endoscopy;  Laterality: N/A;  . HERNIA REPAIR  1995  . PARTIAL HYSTERECTOMY  1992   partial    Current Medications: Current Meds  Medication Sig  . ALPRAZolam (XANAX) 0.25 MG tablet TAKE 1/2 TO 1 TABLET BY MOUTH ONCE DAILY AS NEEDED  . anti-nausea (EMETROL) solution Take 5 mLs by mouth as needed for nausea or vomiting.  . Ascorbic Acid (VITAMIN C) 500 MG CAPS Take by mouth.  . escitalopram (LEXAPRO) 10 MG tablet Take 10 mg by mouth daily.  . famotidine (PEPCID) 40 MG tablet Take 1 tablet (40 mg total) by mouth 2 (two) times daily.  Marland Kitchen glucose blood (ACCU-CHEK SMARTVIEW) test strip 1 each by Other route daily.  . hyoscyamine (ANASPAZ) 0.125 MG TBDP disintergrating tablet Place 1 tablet (0.125 mg total) under the tongue every 4 (four) hours as needed for cramping.  Marland Kitchen ibuprofen (ADVIL,MOTRIN) 200 MG tablet Take 200 mg  by mouth every 6 (six) hours as needed.  Marland Kitchen losartan (COZAAR) 100 MG tablet Take 100 mg by mouth daily.  . metFORMIN (GLUCOPHAGE-XR) 500 MG 24 hr tablet TAKE 1 TABLET(500 MG) BY MOUTH TWICE DAILY WITH A MEAL  . metoprolol succinate (TOPROL-XL) 25 MG 24 hr tablet Take 1 tablet (25 mg total) by mouth 2 (two) times daily. In addition to your 50mg  tablet to total 75mg  daily  . metoprolol succinate (TOPROL-XL) 50 MG 24 hr tablet TAKE 1 TABLET BY MOUTH TWICE DAILY (take in addition to the 25mg  tablet to total 75mg  twice daily). TAKE WITH OR IMMEDIATELY FOLLOWING A MEAL  . Misc Natural Products (ESTROVEN ENERGY PO) Take by mouth.  . ondansetron (ZOFRAN) 4 MG tablet TAKE 1 TABLET BY  MOUTH FOUR TIMES DAILY( BEFORE A MEAL AND EVERY NIGHT AT BEDTIME)  . spironolactone (ALDACTONE) 25 MG tablet Take 1.5 tablets (37.5 mg total) by mouth daily.  . vitamin B-12 (CYANOCOBALAMIN) 1000 MCG tablet Take 2,000 mcg by mouth daily.     Allergies:   Alprazolam, Amlodipine besylate, Aripiprazole, Augmentin [amoxicillin-pot clavulanate], Azithromycin, Bupropion hcl, Cefaclor, Celecoxib, Cephalexin, Ciprofloxacin, Clonidine hydrochloride, Desvenlafaxine, Diphenoxylate-atropine, Divalproex sodium, Doxycycline, Entex, Est estrogens-methyltest, Fluoxetine hcl, Furosemide, Hydrochlorothiazide, Hydrocodone-acetaminophen, Lamotrigine, Lansoprazole, Lisinopril, Lorazepam, Meloxicam, Methylphenidate hcl, Metronidazole, Mirtazapine, Nabumetone, Nitrofuran derivatives, Olmesartan medoxomil, Omeprazole, Other, Oxycodone-acetaminophen, Pantoprazole sodium, Paroxetine, Quetiapine, Rabeprazole sodium, Rofecoxib, Sertraline hcl, Sumatriptan, Telithromycin, Topamax, Tramadol-acetaminophen, Trintellix [vortioxetine], Venlafaxine, Verapamil, and Vilazodone hcl [vilazodone hcl]   Social History   Socioeconomic History  . Marital status: Married    Spouse name: Not on file  . Number of children: 2  . Years of education: Not on file  . Highest education level: Not on file  Occupational History  . Occupation: Retired    Fish farm manager: RETIRED  Social Needs  . Financial resource strain: Not on file  . Food insecurity    Worry: Not on file    Inability: Not on file  . Transportation needs    Medical: Not on file    Non-medical: Not on file  Tobacco Use  . Smoking status: Former Smoker    Types: Cigarettes  . Smokeless tobacco: Never Used  . Tobacco comment: stopped in 1962  Substance and Sexual Activity  . Alcohol use: No    Alcohol/week: 0.0 standard drinks  . Drug use: No  . Sexual activity: Not on file  Lifestyle  . Physical activity    Days per week: Not on file    Minutes per session: Not on file   . Stress: Not on file  Relationships  . Social Herbalist on phone: Not on file    Gets together: Not on file    Attends religious service: Not on file    Active member of club or organization: Not on file    Attends meetings of clubs or organizations: Not on file    Relationship status: Not on file  Other Topics Concern  . Not on file  Social History Narrative   Occupation: retired   Patient is a former smoker. -stopped 1962   Alcohol Use - no     Illicit Drug Use - no         Daily Caffeine Use  -1 cup daily           Position roster   Cardiologist-Dr. Tamala Julian   GYN-Dr. Corinna Capra   Ophthalmologist-Dr. Katy Fitch   Gastroenterologist-Dr. Carlean Purl     Family History: The patient's family history includes Diabetes in her  maternal aunt, maternal uncle, and mother; Heart disease in her father and paternal aunt. There is no history of Colon cancer.  ROS:   Please see the history of present illness.    She recorded heart rates and blood pressures since the medication titration and generally speaking the blood pressures have been less than 130/80 mmHg with lowest recording being 115/52 mmHg.  Heart rates have all ranged between 58 and 80 bpm.  No heart rates greater than 180 beats per minute..  All other systems reviewed and are negative.  EKGs/Labs/Other Studies Reviewed:    The following studies were reviewed today: No new data  EKG:  EKG EKG is not repeated today.  Recent Labs: 07/05/2019: ALT 12; BUN 13; Creatinine, Ser 0.69; Hemoglobin 13.1; Platelets 272; Potassium 4.1; Sodium 140; TSH 1.750  Recent Lipid Panel    Component Value Date/Time   CHOL 196 03/24/2017 1048   TRIG 209.0 (H) 03/24/2017 1048   HDL 49.40 03/24/2017 1048   CHOLHDL 4 03/24/2017 1048   VLDL 41.8 (H) 03/24/2017 1048   LDLCALC 98 12/19/2016 1457   LDLDIRECT 124.0 03/24/2017 1048    Physical Exam:    VS:  BP 124/81   Pulse 78   Ht 5' 5.5" (1.664 m)   Wt 151 lb 1.9 oz (68.5 kg)   SpO2 97%   BMI  24.77 kg/m     Wt Readings from Last 3 Encounters:  09/06/19 151 lb 1.9 oz (68.5 kg)  07/05/19 157 lb 6.4 oz (71.4 kg)  05/16/19 159 lb 3.2 oz (72.2 kg)     GEN: Appears well and comfortable.. No acute distress HEENT: Normal NECK: No JVD. LYMPHATICS: No lymphadenopathy CARDIAC:  RRR without murmur, gallop, or edema. VASCULAR:  Normal Pulses. No bruits. RESPIRATORY:  Clear to auscultation without rales, wheezing or rhonchi  ABDOMEN: Soft, non-tender, non-distended, No pulsatile mass, MUSCULOSKELETAL: No deformity  SKIN: Warm and dry NEUROLOGIC:  Alert and oriented x 3 PSYCHIATRIC:  Normal affect   ASSESSMENT:    1. Paroxysmal supraventricular tachycardia ([place])   2. Essential hypertension   3. Subvalvular aortic stenosis   4. Hyperlipidemia with target LDL less than 70   5. Educated about COVID-19 virus infection    PLAN:    In order of problems listed above:  1. The increase in metoprolol to 75 mg/day seems to be working.  Plan clinical observation. 2. Blood pressures well controlled and she seems to be tolerating the higher dose of metoprolol without issues. 3. Minimal if any murmur heard on auscultation today. 4. Lipids were not discussed 5. The 3W's were discussed and endorsed as lifestyle modifications by the patient.  Clinical observation.  Call if recurring episodes of increased heart rate.  Otherwise 9 to 5-month follow-up.   Medication Adjustments/Labs and Tests Ordered: Current medicines are reviewed at length with the patient today.  Concerns regarding medicines are outlined above.  No orders of the defined types were placed in this encounter.  No orders of the defined types were placed in this encounter.   Patient Instructions  Medication Instructions:  Your physician recommends that you continue on your current medications as directed. Please refer to the Current Medication list given to you today.  If you need a refill on your cardiac medications  before your next appointment, please call your pharmacy.   Lab work: None If you have labs (blood work) drawn today and your tests are completely normal, you will receive your results only by: Marland Kitchen MyChart Message (  if you have MyChart) OR . A paper copy in the mail If you have any lab test that is abnormal or we need to change your treatment, we will call you to review the results.  Testing/Procedures: None  Follow-Up:  Your physician wants you to follow-up in: 6 months with Truitt Merle, NP.  You will receive a reminder letter in the mail two months in advance. If you don't receive a letter, please call our office to schedule the follow-up appointment.   At [facility], you and your health needs are our priority.  As part of our continuing mission to provide you with exceptional heart care, we have created designated Provider Care Teams.  These Care Teams include your primary Cardiologist (physician) and Advanced Practice Providers (APPs -  Physician Assistants and Nurse Practitioners) who all work together to provide you with the care you need, when you need it. You will need a follow up appointment in 12 months.  Please call our office 2 months in advance to schedule this appointment.  You may see Sinclair Grooms, MD or one of the following Advanced Practice Providers on your designated Care Team:   Truitt Merle, NP Cecilie Kicks, NP . Kathyrn Drown, NP  Any Other Special Instructions Will Be Listed Below (If Applicable).       Signed, Sinclair Grooms, MD  09/06/2019 5:47 PM    [place]

## 2019-09-06 ENCOUNTER — Encounter: Payer: Self-pay | Admitting: Interventional Cardiology

## 2019-09-06 ENCOUNTER — Ambulatory Visit: Payer: Medicare Other | Admitting: Interventional Cardiology

## 2019-09-06 ENCOUNTER — Other Ambulatory Visit: Payer: Self-pay

## 2019-09-06 VITALS — BP 124/81 | HR 78 | Ht 65.5 in | Wt 151.1 lb

## 2019-09-06 DIAGNOSIS — I471 Supraventricular tachycardia: Secondary | ICD-10-CM | POA: Diagnosis not present

## 2019-09-06 DIAGNOSIS — Q244 Congenital subaortic stenosis: Secondary | ICD-10-CM

## 2019-09-06 DIAGNOSIS — I10 Essential (primary) hypertension: Secondary | ICD-10-CM

## 2019-09-06 DIAGNOSIS — E785 Hyperlipidemia, unspecified: Secondary | ICD-10-CM

## 2019-09-06 DIAGNOSIS — Z7189 Other specified counseling: Secondary | ICD-10-CM

## 2019-09-06 NOTE — Patient Instructions (Addendum)
Medication Instructions:  Your physician recommends that you continue on your current medications as directed. Please refer to the Current Medication list given to you today.  If you need a refill on your cardiac medications before your next appointment, please call your pharmacy.   Lab work: None If you have labs (blood work) drawn today and your tests are completely normal, you will receive your results only by: Marland Kitchen MyChart Message (if you have MyChart) OR . A paper copy in the mail If you have any lab test that is abnormal or we need to change your treatment, we will call you to review the results.  Testing/Procedures: None  Follow-Up:  Your physician wants you to follow-up in: 6 months with Truitt Merle, NP.  You will receive a reminder letter in the mail two months in advance. If you don't receive a letter, please call our office to schedule the follow-up appointment.   At [facility], you and your health needs are our priority.  As part of our continuing mission to provide you with exceptional heart care, we have created designated Provider Care Teams.  These Care Teams include your primary Cardiologist (physician) and Advanced Practice Providers (APPs -  Physician Assistants and Nurse Practitioners) who all work together to provide you with the care you need, when you need it. You will need a follow up appointment in 12 months.  Please call our office 2 months in advance to schedule this appointment.  You may see Sinclair Grooms, MD or one of the following Advanced Practice Providers on your designated Care Team:   Truitt Merle, NP Cecilie Kicks, NP . Kathyrn Drown, NP  Any Other Special Instructions Will Be Listed Below (If Applicable).

## 2019-11-02 ENCOUNTER — Other Ambulatory Visit: Payer: Self-pay

## 2019-11-02 MED ORDER — FAMOTIDINE 40 MG PO TABS: 40.0000 mg | ORAL_TABLET | Freq: Two times a day (BID) | ORAL | 0 refills | Status: DC

## 2019-11-02 NOTE — Telephone Encounter (Signed)
Famotidine refilled as pharmacy requested.

## 2019-11-07 ENCOUNTER — Other Ambulatory Visit: Payer: Self-pay | Admitting: Internal Medicine

## 2019-11-07 MED ORDER — FAMOTIDINE 40 MG PO TABS: 40.0000 mg | ORAL_TABLET | Freq: Two times a day (BID) | ORAL | 3 refills | Status: DC

## 2019-11-07 NOTE — Progress Notes (Signed)
Asking for famotidine refill while here w/ husband - done

## 2019-11-24 ENCOUNTER — Telehealth: Payer: Self-pay | Admitting: Interventional Cardiology

## 2019-11-24 NOTE — Telephone Encounter (Signed)
Spoke with pt.  At current time she feels well, BP 111/55 HR 55.  Over the last couple of weeks HR has varied from 55-101, she has had some episodes of dizziness that were brief and self limiting.  Provided reassurance that HR is within a normal range, continue to monitor if symptoms continue notify office to get an earlier appt.

## 2019-11-24 NOTE — Telephone Encounter (Signed)
STAT if HR is under 50 or over 120 (normal HR is 60-100 beats per minute)  1) What is your heart rate? Running from 55 to 101  2) Do you have a log of your heart rate readings (document readings)? yes  3) Do you have any other symptoms? Dizzy, lightheaded, off balance and blood pressure is bouncing around    Pt c/o BP issue: STAT if pt c/o blurred vision, one-sided weakness or slurred speech  1. What are your last 5 BP readings?  11-22-19- 118/61 11-23-19- 111/53 and today it is 111/55 and heart rate 55* 11-13-20 134/111  2. Are you having any other symptoms (ex. Dizziness, headache, blurred vision, passed out)? Dizzy, lightheaded and off balanced  3. What is your BP issue?  After 11-14-19  Blood pressure , her bottom number is low

## 2019-12-02 ENCOUNTER — Telehealth: Payer: Self-pay | Admitting: Interventional Cardiology

## 2019-12-02 NOTE — Telephone Encounter (Signed)
Pt called last week with similar issue of high HR.  No issues since she last called the office. HRs have been mostly in the 60s and maybe 1-2x in the 90s.  Today she was on the treadmill and when she got off she felt dizzy.  BP was fine but HR was 103.  She rested a bit and dizziness resolved.  Rechecked HR an hour later and it was 102.  Pt takes Metoprolol Succinate 75mg  twice daily.  Advised pt to take her evening dose as usual and recheck HR after 2 hours.  Made pt aware of on call service in case HR is still elevated.  Will route to Dr. Acie Fredrickson, DOD to see if any further recommendations at this time.

## 2019-12-02 NOTE — Telephone Encounter (Signed)
STAT if HR is under 50 or over 120 (normal HR is 60-100 beats per minute)  1) What is your heart rate? at 2:40pm HR 103, at 3:35pm HR 102  2) Do you have a log of your heart rate readings (document readings)? 92, 65, 92, 61, 66, 103, 102  3) Do you have any other symptoms? Patient states she had just gotten off the treadmill at 2:40 when her HR was 103. She says she felt lightheaded and nauseas and would like a call back for advice.

## 2019-12-02 NOTE — Telephone Encounter (Signed)
Agree with note by Maude Leriche, RN No new recommendations

## 2019-12-12 ENCOUNTER — Encounter: Payer: Self-pay | Admitting: Interventional Cardiology

## 2019-12-12 NOTE — Telephone Encounter (Signed)
Patient is calling to follow up. Patient states that she has been feeling dizzy again. She also states that she has had blurred vision on and off.  STAT if patient feels like he/she is going to faint   1) Are you dizzy now? yes  2) Do you feel faint or have you passed out? patient feels faint  3) Do you have any other symptoms?   no  4) Have you checked your HR and BP (record if available)?

## 2019-12-12 NOTE — Telephone Encounter (Signed)
Pt states she is still having episodes of dizziness  and feeling off balance since we spoke about 10 days ago.  More recently has been having "jiggly figures" in her eyes.  States vision is blurry until the figures "get across my eyes" and then it resolves.  Has a HA today.  Vitals are as follows:  8A- 118/60, 64 9:30A- 120/58 3:15P- 116/59, 68  States the episodes with her eyes has been occurring intermittently since November.  Advised pt I will send to Dr. Tamala Julian for review and will call with recommendation when I hear back.

## 2019-12-12 NOTE — Telephone Encounter (Signed)
error 

## 2019-12-13 ENCOUNTER — Telehealth: Payer: Self-pay | Admitting: Interventional Cardiology

## 2019-12-13 NOTE — Telephone Encounter (Signed)
Speak to PCP about scotoma. Watchful waiting on other.

## 2019-12-13 NOTE — Telephone Encounter (Signed)
New message   Patient states that she would like to be released from seeing Dr. Tamala Julian and would now like to see Dr. Martinique. Please advise.

## 2019-12-13 NOTE — Telephone Encounter (Signed)
Spoke with pt and made her aware of recommendations.  Pt agreeable to plan.

## 2019-12-13 NOTE — Telephone Encounter (Signed)
Fine by me  Dimitrious Micciche Martinique MD, [facility]

## 2019-12-13 NOTE — Telephone Encounter (Signed)
Okay with me 

## 2020-02-09 ENCOUNTER — Ambulatory Visit: Payer: Medicare Other | Admitting: Interventional Cardiology

## 2020-02-10 NOTE — Progress Notes (Signed)
Cardiology Office Note   Date:  02/15/2020   ID:  Cynthia Gilmore, DOB 1934-07-12, MRN [id number]  PCP:  Cynthia Graff.Marlou Sa, MD  Cardiologist:   Cynthia Lover Martinique, MD   No chief complaint on file.     History of Present Illness: Cynthia Gilmore is a 86 y.o. female who presents for evaluation of SVT. Formerly seen by Dr Cynthia Gilmore. She has a history of subvalvular Aortic stenosis with gradient. She also has history of anxiety disorder, DM type 2, and history of intermittent chest pain. She also has a history of nonsustained SVT and wide complex arrhythmia treated with Toprol XL.  She has multiple drug intolerances. On Lexapro for depression.   On follow up today she is doing very well. Has not had any skipping in awhile on metoprolol. Sugars are good. No chest pain, dyspnea, edema. Feels well.    Past Medical History:  Diagnosis Date  . Allergy   . Anxiety   . Anxiety and depression    ? bipolar  . Cataract    bil cataracts removed  . Depression   . Diabetes mellitus, type 2 ([place])   . GERD (gastroesophageal reflux disease)   . Heart murmur   . Hemorrhoids 2012   . Hiatal hernia   . Hypertension   . IBS (irritable bowel syndrome)   . Lymphocytic colitis 2012  . Mumerous medication allergies and sensitivities 07/20/2015  . Osteoarthritis (arthritis due to wear and tear of joints)   . Segmental colitis ([place])    iscemic colitis 2009  . Stroke [facility])    states 2 "'mini strokes" 1980  . TIA (transient ischemic attack) 2000   x 3     Past Surgical History:  Procedure Laterality Date  . ANKLE SURGERY  1999   right ankle fracture surgery  . BLADDER REPAIR  1995  . CATARACT EXTRACTION, BILATERAL    . CHOLECYSTECTOMY  2004  . COLONOSCOPY W/ BIOPSIES AND POLYPECTOMY   07/29/2011   internal hemorrhoids, lymphocytic colitis  . ESOPHAGEAL MANOMETRY     Normal  . ESOPHAGOGASTRODUODENOSCOPY  07/29/2011   gastritis,hiatal hernia, tortuous esophagus  . EUS  06/03/2012   Procedure:  UPPER ENDOSCOPIC ULTRASOUND (EUS) LINEAR;  Surgeon: Cynthia Banister, MD;  Location: [facility];  Service: Endoscopy;  Laterality: N/A;  . HERNIA REPAIR  1995  . PARTIAL HYSTERECTOMY  1992   partial     Current Outpatient Medications  Medication Sig Dispense Refill  . ALPRAZolam (XANAX) 0.25 MG tablet TAKE 1/2 TO 1 TABLET BY MOUTH ONCE DAILY AS NEEDED 30 tablet 0  . anti-nausea (EMETROL) solution Take 5 mLs by mouth as needed for nausea or vomiting.    . Ascorbic Acid (VITAMIN C) 500 MG CAPS Take by mouth.    . escitalopram (LEXAPRO) 10 MG tablet Take 10 mg by mouth daily.    . famotidine (PEPCID) 40 MG tablet Take 1 tablet (40 mg total) by mouth 2 (two) times daily. 180 tablet 3  . glucose blood (ACCU-CHEK SMARTVIEW) test strip 1 each by Other route daily. 100 each 12  . hyoscyamine (ANASPAZ) 0.125 MG TBDP disintergrating tablet Place 1 tablet (0.125 mg total) under the tongue every 4 (four) hours as needed for cramping. 90 tablet 5  . ibuprofen (ADVIL,MOTRIN) 200 MG tablet Take 200 mg by mouth every 6 (six) hours as needed.    Marland Kitchen losartan (COZAAR) 100 MG tablet Take 100 mg by mouth daily.    Marland Kitchen  metFORMIN (GLUCOPHAGE-XR) 500 MG 24 hr tablet TAKE 1 TABLET(500 MG) BY MOUTH TWICE DAILY WITH A MEAL 180 tablet 0  . metoprolol succinate (TOPROL-XL) 25 MG 24 hr tablet Take 1 tablet (25 mg total) by mouth 2 (two) times daily. In addition to your 50mg  tablet to total 75mg  daily 60 tablet 11  . metoprolol succinate (TOPROL-XL) 50 MG 24 hr tablet TAKE 1 TABLET BY MOUTH TWICE DAILY (take in addition to the 25mg  tablet to total 75mg  twice daily). TAKE WITH OR IMMEDIATELY FOLLOWING A MEAL 180 tablet 3  . Misc Natural Products (ESTROVEN ENERGY PO) Take by mouth.    . ondansetron (ZOFRAN) 4 MG tablet TAKE 1 TABLET BY MOUTH FOUR TIMES DAILY( BEFORE A MEAL AND EVERY NIGHT AT BEDTIME) 120 tablet 11  . spironolactone (ALDACTONE) 25 MG tablet Take 1.5 tablets (37.5 mg total) by mouth daily. 135 tablet 3  .  vitamin B-12 (CYANOCOBALAMIN) 1000 MCG tablet Take 2,000 mcg by mouth daily.     No current facility-administered medications for this visit.    Allergies:   Alprazolam, Amlodipine besylate, Aripiprazole, Augmentin [amoxicillin-pot clavulanate], Azithromycin, Bupropion hcl, Cefaclor, Celecoxib, Cephalexin, Ciprofloxacin, Clonidine hydrochloride, Desvenlafaxine, Diphenoxylate-atropine, Divalproex sodium, Doxycycline, Entex, Est estrogens-methyltest, Fluoxetine hcl, Furosemide, Hydrochlorothiazide, Hydrocodone-acetaminophen, Lamotrigine, Lansoprazole, Lisinopril, Lorazepam, Meloxicam, Methylphenidate hcl, Metronidazole, Mirtazapine, Nabumetone, Nitrofuran derivatives, Olmesartan medoxomil, Omeprazole, Other, Oxycodone-acetaminophen, Pantoprazole sodium, Paroxetine, Quetiapine, Rabeprazole sodium, Rofecoxib, Sertraline hcl, Sumatriptan, Telithromycin, Topamax, Tramadol-acetaminophen, Trintellix [vortioxetine], Venlafaxine, Verapamil, and Vilazodone hcl [vilazodone hcl]    Social History:  The patient  reports that she has quit smoking. Her smoking use included cigarettes. She has never used smokeless tobacco. She reports that she does not drink alcohol or use drugs.   Family History:  The patient's family history includes Diabetes in her maternal aunt, maternal uncle, and mother; Heart disease in her father and paternal aunt.    ROS:  Please see the history of present illness.   Otherwise, review of systems are positive for .   All other systems are reviewed and negative.    PHYSICAL EXAM: VS:  BP 128/72   Pulse 69   Ht 5' 5.5" (1.664 m)   Wt 143 lb (64.9 kg)   SpO2 97%   BMI 23.43 kg/m  , BMI Body mass index is 23.43 kg/m. GEN: Well nourished, well developed, in no acute distress  HEENT: normal  Neck: no JVD, carotid bruits, or masses Cardiac: RRR; no  rubs, or gallops,no edema  Soft 1/6 systolic murmur RUSB Respiratory:  clear to auscultation bilaterally, normal work of breathing GI:  soft, nontender, nondistended, + BS MS: no deformity or atrophy  Skin: warm and dry, no rash Neuro:  Strength and sensation are intact Psych: euthymic mood, full affect   EKG:  EKG is not ordered today. The ekg ordered today demonstrates N/A   Recent Labs: 07/05/2019: ALT 12; BUN 13; Creatinine, Ser 0.69; Hemoglobin 13.1; Platelets 272; Potassium 4.1; Sodium 140; TSH 1.750    Lipid Panel    Component Value Date/Time   CHOL 196 03/24/2017 1048   TRIG 209.0 (H) 03/24/2017 1048   HDL 49.40 03/24/2017 1048   CHOLHDL 4 03/24/2017 1048   VLDL 41.8 (H) 03/24/2017 1048   LDLCALC 98 12/19/2016 1457   LDLDIRECT 124.0 03/24/2017 1048      Wt Readings from Last 3 Encounters:  02/15/20 143 lb (64.9 kg)  09/06/19 151 lb 1.9 oz (68.5 kg)  07/05/19 157 lb 6.4 oz (71.4 kg)      Other studies  Reviewed: Additional studies/ records that were reviewed today include:   Echo 11/22/13: Study Conclusions   - Left ventricle: The cavity size was normal. There was mild  focal basal hypertrophy of the septum. Systolic function  was vigorous. The estimated ejection fraction was in the  range of 65% to 70%. Wall motion was normal; there were no  regional wall motion abnormalities.  - Aortic valve: Mild sub aortic valve gradient.  - Mitral valve: There was systolic anterior motion. Mild  regurgitation.   Myoview 03/17/17: Study Highlights    Nuclear stress EF: 81%.  Blood pressure demonstrated a normal response to exercise.  There was no ST segment deviation noted during stress.  Defect 1: There is a small defect of moderate severity present in the basal inferior location.  This is a low risk study.  The left ventricular ejection fraction is hyperdynamic (>65%).   Low risk stress nuclear study with mild inferobasal thinning; no significant ischemia (minimal reversibility felt unlikely to be significant); EF 81 with normal wall motion.    Event monitor 03/17/17: Study  Highlights    The underlying basic rhythm is normal sinus rhythm  No sustained arrhythmias, either tachycardia or bradycardia were noted.  Multiple events of "palpitation" and "flutter ", were reported but do not correlate with significant arrhythmia.  On one occasion "palpitation" was associated with ventricular bigeminy   Event monitor 06/14/19: Study Highlights   NSR is basic rhythm  Non sustained WCT lasting up to 12 seconds with rate 154-170 bpm  Nonsustained SVT  PAC's and PVC's      ASSESSMENT AND PLAN:  1. PSVT/ wide complex arrythmia. Well controlled on low dose metoprolol.   2. HTN essential- controlled  3. Subvalvular aortic stenosis- mild obstruction and asymptomatic.       Current medicines are reviewed at length with the patient today.  The patient does not have concerns regarding medicines.  The following changes have been made:  no change  Labs/ tests ordered today include:  No orders of the defined types were placed in this encounter.    Disposition:   FU with me in 6 months  Signed, Cynthia Reaser Martinique, MD  02/15/2020 4:06 PM    [facility] [address], [place], [place], [address] Phone [phone], Fax [phone]

## 2020-02-15 ENCOUNTER — Other Ambulatory Visit: Payer: Self-pay

## 2020-02-15 ENCOUNTER — Encounter: Payer: Self-pay | Admitting: Cardiology

## 2020-02-15 ENCOUNTER — Ambulatory Visit: Payer: Medicare Other | Admitting: Cardiology

## 2020-02-15 VITALS — BP 128/72 | HR 69 | Ht 65.5 in | Wt 143.0 lb

## 2020-02-15 DIAGNOSIS — I471 Supraventricular tachycardia: Secondary | ICD-10-CM

## 2020-02-15 DIAGNOSIS — I10 Essential (primary) hypertension: Secondary | ICD-10-CM | POA: Diagnosis not present

## 2020-02-15 DIAGNOSIS — Q244 Congenital subaortic stenosis: Secondary | ICD-10-CM | POA: Diagnosis not present

## 2020-02-27 ENCOUNTER — Other Ambulatory Visit: Payer: Self-pay

## 2020-02-27 MED ORDER — SPIRONOLACTONE 25 MG PO TABS: 37.5000 mg | ORAL_TABLET | Freq: Every day | ORAL | 3 refills | Status: DC

## 2020-03-21 ENCOUNTER — Other Ambulatory Visit: Payer: Self-pay | Admitting: Nurse Practitioner

## 2020-03-22 ENCOUNTER — Telehealth: Payer: Self-pay | Admitting: Cardiology

## 2020-03-22 MED ORDER — METOPROLOL SUCCINATE ER 50 MG PO TB24: ORAL_TABLET | ORAL | 3 refills | Status: DC

## 2020-03-22 NOTE — Telephone Encounter (Signed)
Pt sees Dr. Martinique now. Please address

## 2020-03-22 NOTE — Telephone Encounter (Signed)
*  STAT* If patient is at the pharmacy, call can be transferred to refill team.   1. Which medications need to be refilled? (please list name of each medication and dose if known) metoprolol succinate (TOPROL-XL) 50 MG 24 hr tablet  2. Which pharmacy/location (including street and city if local pharmacy) is medication to be sent to? [facility] [place], [place]  3. Do they need a 30 day or 90 day supply? 90 day supply

## 2020-03-23 MED ORDER — METOPROLOL SUCCINATE ER 25 MG PO TB24: 25.0000 mg | ORAL_TABLET | Freq: Two times a day (BID) | ORAL | 3 refills | Status: DC

## 2020-06-20 ENCOUNTER — Encounter (HOSPITAL_BASED_OUTPATIENT_CLINIC_OR_DEPARTMENT_OTHER): Payer: Self-pay | Admitting: *Deleted

## 2020-06-20 ENCOUNTER — Emergency Department (HOSPITAL_BASED_OUTPATIENT_CLINIC_OR_DEPARTMENT_OTHER)
Admission: EM | Admit: 2020-06-20 | Discharge: 2020-06-20 | Disposition: A | Discharge status: Home / Self Care | Payer: Medicare Other | Attending: Emergency Medicine | Admitting: Emergency Medicine

## 2020-06-20 ENCOUNTER — Ambulatory Visit
Admission: EM | Admit: 2020-06-20 | Discharge: 2020-06-20 | Disposition: A | Discharge status: Home / Self Care | Payer: Medicare Other | Source: Home / Self Care

## 2020-06-20 ENCOUNTER — Emergency Department (HOSPITAL_BASED_OUTPATIENT_CLINIC_OR_DEPARTMENT_OTHER): Payer: Medicare Other

## 2020-06-20 ENCOUNTER — Other Ambulatory Visit: Payer: Self-pay

## 2020-06-20 DIAGNOSIS — I10 Essential (primary) hypertension: Secondary | ICD-10-CM | POA: Insufficient documentation

## 2020-06-20 DIAGNOSIS — W1809XA Striking against other object with subsequent fall, initial encounter: Secondary | ICD-10-CM | POA: Insufficient documentation

## 2020-06-20 DIAGNOSIS — E119 Type 2 diabetes mellitus without complications: Secondary | ICD-10-CM | POA: Insufficient documentation

## 2020-06-20 DIAGNOSIS — S60012A Contusion of left thumb without damage to nail, initial encounter: Secondary | ICD-10-CM | POA: Diagnosis not present

## 2020-06-20 DIAGNOSIS — Z79899 Other long term (current) drug therapy: Secondary | ICD-10-CM | POA: Insufficient documentation

## 2020-06-20 DIAGNOSIS — Y999 Unspecified external cause status: Secondary | ICD-10-CM | POA: Diagnosis not present

## 2020-06-20 DIAGNOSIS — W19XXXD Unspecified fall, subsequent encounter: Secondary | ICD-10-CM

## 2020-06-20 DIAGNOSIS — S0083XA Contusion of other part of head, initial encounter: Secondary | ICD-10-CM

## 2020-06-20 DIAGNOSIS — S0990XA Unspecified injury of head, initial encounter: Secondary | ICD-10-CM | POA: Diagnosis not present

## 2020-06-20 DIAGNOSIS — S40211A Abrasion of right shoulder, initial encounter: Secondary | ICD-10-CM | POA: Diagnosis not present

## 2020-06-20 DIAGNOSIS — W19XXXA Unspecified fall, initial encounter: Secondary | ICD-10-CM

## 2020-06-20 DIAGNOSIS — S60932A Unspecified superficial injury of left thumb, initial encounter: Secondary | ICD-10-CM | POA: Diagnosis present

## 2020-06-20 DIAGNOSIS — Y9301 Activity, walking, marching and hiking: Secondary | ICD-10-CM | POA: Insufficient documentation

## 2020-06-20 DIAGNOSIS — Y92480 Sidewalk as the place of occurrence of the external cause: Secondary | ICD-10-CM | POA: Insufficient documentation

## 2020-06-20 DIAGNOSIS — R519 Headache, unspecified: Secondary | ICD-10-CM

## 2020-06-20 DIAGNOSIS — M25511 Pain in right shoulder: Secondary | ICD-10-CM

## 2020-06-20 DIAGNOSIS — Z87891 Personal history of nicotine dependence: Secondary | ICD-10-CM | POA: Insufficient documentation

## 2020-06-20 DIAGNOSIS — S6992XD Unspecified injury of left wrist, hand and finger(s), subsequent encounter: Secondary | ICD-10-CM

## 2020-06-20 DIAGNOSIS — Z7984 Long term (current) use of oral hypoglycemic drugs: Secondary | ICD-10-CM | POA: Diagnosis not present

## 2020-06-20 DIAGNOSIS — M79645 Pain in left finger(s): Secondary | ICD-10-CM

## 2020-06-20 NOTE — ED Provider Notes (Signed)
[name]    CSN: [id number] Arrival date & time: 06/20/20  1436      History   Chief Complaint Chief Complaint  Patient presents with  . Fall    HPI Cynthia Gilmore is a 86 y.o. female.   86 year old female with history of DM, HTN, CVA, comes in for fall 30 minutes prior to arrival.  States had her toe, causing her to fall, hitting right side of the face.  Denies loss of consciousness.  Needed assistance to ambulate after fall.  She has pain with swelling and bruising.  Right face, shoulder, left thumb.  Also with multiple abrasions.  Not on any blood thinners.  Denies nausea, vomiting, photophobia, weakness, dizziness.     Past Medical History:  Diagnosis Date  . Allergy   . Anxiety   . Anxiety and depression    ? bipolar  . Cataract    bil cataracts removed  . Depression   . Diabetes mellitus, type 2 ([place])   . GERD (gastroesophageal reflux disease)   . Heart murmur   . Hemorrhoids 2012   . Hiatal hernia   . Hypertension   . IBS (irritable bowel syndrome)   . Lymphocytic colitis 2012  . Mumerous medication allergies and sensitivities 07/20/2015  . Osteoarthritis (arthritis due to wear and tear of joints)   . Segmental colitis ([place])    iscemic colitis 2009  . Stroke [facility])    states 2 "'mini strokes" 1980  . TIA (transient ischemic attack) 2000   x 3     Patient Active Problem List   Diagnosis Date Noted  . Subvalvular aortic stenosis 03/02/2017  . Low back pain radiating to both legs 06/06/2016  . Sinusitis, acute 11/27/2015  . Numerous medication allergies and sensitivities 07/20/2015  . Carpal tunnel syndrome 05/30/2015  . Dysuria 05/30/2015  . Preventative health care 10/13/2014  . Paroxysmal supraventricular tachycardia ([place]) 11/03/2013  . Palpitation 07/07/2012  . Lipoma stomach 06/03/2012  . Intermittent vertigo 10/08/2011  . GERD (gastroesophageal reflux disease) 06/20/2011  . IBS (irritable bowel syndrome) 06/20/2011  .  DEPRESSION/ANXIETY 11/11/2010  . Type II diabetes mellitus with neurological manifestations ([place]) 01/31/2010  . ALLERGIC RHINITIS 09/13/2009  . INSOMNIA, CHRONIC 05/24/2009  . MEMORY LOSS 05/24/2009  . ABDOMINAL PAIN-EPIGASTRIC 01/09/2009  . Lymphocytic colitis 07/19/2008  . PERSONAL HX COLONIC POLYPS 07/19/2008  . Essential hypertension 01/30/2007  . DEGENERATIVE DISC DISEASE 01/30/2007  . CARDIAC MURMUR, AORTIC 01/30/2007    Past Surgical History:  Procedure Laterality Date  . ANKLE SURGERY  1999   right ankle fracture surgery  . BLADDER REPAIR  1995  . CATARACT EXTRACTION, BILATERAL    . CHOLECYSTECTOMY  2004  . COLONOSCOPY W/ BIOPSIES AND POLYPECTOMY   07/29/2011   internal hemorrhoids, lymphocytic colitis  . ESOPHAGEAL MANOMETRY     Normal  . ESOPHAGOGASTRODUODENOSCOPY  07/29/2011   gastritis,hiatal hernia, tortuous esophagus  . EUS  06/03/2012   Procedure: UPPER ENDOSCOPIC ULTRASOUND (EUS) LINEAR;  Surgeon: Milus Banister, MD;  Location: [facility];  Service: Endoscopy;  Laterality: N/A;  . HERNIA REPAIR  1995  . PARTIAL HYSTERECTOMY  1992   partial    OB History   No obstetric history on file.      Home Medications    Prior to Admission medications   Medication Sig Start Date End Date Taking? Authorizing Provider  ALPRAZolam (XANAX) 0.25 MG tablet TAKE 1/2 TO 1 TABLET BY MOUTH ONCE DAILY AS NEEDED 03/23/17  Carollee Herter, Yvonne R, DO  anti-nausea (EMETROL) solution Take 5 mLs by mouth as needed for nausea or vomiting.    [provider]  Ascorbic Acid (VITAMIN C) 500 MG CAPS Take by mouth.    [provider]  escitalopram (LEXAPRO) 10 MG tablet Take 10 mg by mouth daily.    [provider]  famotidine (PEPCID) 40 MG tablet Take 1 tablet (40 mg total) by mouth 2 (two) times daily. 11/07/19   Gatha Mayer, MD  glucose blood (ACCU-CHEK SMARTVIEW) test strip 1 each by Other route daily. 10/16/14   Shawna Orleans, Doe-Hyun R, DO  hyoscyamine  (ANASPAZ) 0.125 MG TBDP disintergrating tablet Place 1 tablet (0.125 mg total) under the tongue every 4 (four) hours as needed for cramping. 11/18/16   Gatha Mayer, MD  ibuprofen (ADVIL,MOTRIN) 200 MG tablet Take 200 mg by mouth every 6 (six) hours as needed.    [provider]  losartan (COZAAR) 100 MG tablet Take 100 mg by mouth daily.    [provider]  metFORMIN (GLUCOPHAGE-XR) 500 MG 24 hr tablet TAKE 1 TABLET(500 MG) BY MOUTH TWICE DAILY WITH A MEAL 06/28/18   Carollee Herter, Alferd Apa, DO  metoprolol succinate (TOPROL-XL) 25 MG 24 hr tablet Take 1 tablet (25 mg total) by mouth 2 (two) times daily. In addition to your 50mg  tablet to total 75mg  daily 03/23/20   Isaiah Serge, NP  metoprolol succinate (TOPROL-XL) 50 MG 24 hr tablet TAKE 1 TABLET BY MOUTH TWICE DAILY (take in addition to the 25mg  tablet to total 75mg  twice daily). TAKE WITH OR IMMEDIATELY FOLLOWING A MEAL 03/22/20   Belva Crome, MD  Misc Natural Products (ESTROVEN ENERGY PO) Take by mouth.    [provider]  ondansetron (ZOFRAN) 4 MG tablet TAKE 1 TABLET BY MOUTH FOUR TIMES DAILY( BEFORE A MEAL AND EVERY NIGHT AT BEDTIME) 09/17/18   Gatha Mayer, MD  spironolactone (ALDACTONE) 25 MG tablet Take 1.5 tablets (37.5 mg total) by mouth daily. 02/27/20   Belva Crome, MD  vitamin B-12 (CYANOCOBALAMIN) 1000 MCG tablet Take 2,000 mcg by mouth daily.    [provider]    Family History Family History  Problem Relation Age of Onset  . Diabetes Mother   . Heart disease Father   . Diabetes Maternal Aunt   . Diabetes Maternal Uncle   . Heart disease Paternal Aunt   . Colon cancer Neg Hx     Social History Social History   Tobacco Use  . Smoking status: Former Smoker    Types: Cigarettes  . Smokeless tobacco: Never Used  . Tobacco comment: stopped in 1962  Substance Use Topics  . Alcohol use: No    Alcohol/week: 0.0 standard drinks  . Drug use: No     Allergies   Alprazolam,  Amlodipine besylate, Aripiprazole, Augmentin [amoxicillin-pot clavulanate], Azithromycin, Bupropion hcl, Cefaclor, Celecoxib, Cephalexin, Ciprofloxacin, Clonidine hydrochloride, Desvenlafaxine, Diphenoxylate-atropine, Divalproex sodium, Doxycycline, Entex, Est estrogens-methyltest, Fluoxetine hcl, Furosemide, Hydrochlorothiazide, Hydrocodone-acetaminophen, Lamotrigine, Lansoprazole, Lisinopril, Lorazepam, Meloxicam, Methylphenidate hcl, Metronidazole, Mirtazapine, Nabumetone, Nitrofuran derivatives, Olmesartan medoxomil, Omeprazole, Other, Oxycodone-acetaminophen, Pantoprazole sodium, Paroxetine, Quetiapine, Rabeprazole sodium, Rofecoxib, Sertraline hcl, Sumatriptan, Telithromycin, Topamax, Tramadol-acetaminophen, Trintellix [vortioxetine], Venlafaxine, Verapamil, and Vilazodone hcl [vilazodone hcl]   Review of Systems Review of Systems  Reason unable to perform ROS: See HPI as above.     Physical Exam Triage Vital Signs ED Triage Vitals [06/20/20 1512]  Enc Vitals Group     BP 125/66  Pulse Rate 85     Resp 18     Temp (!) 97.5 F (36.4 C)     Temp Source Oral     SpO2 96 %     Weight      Height      Head Circumference      Peak Flow      Pain Score 9     Pain Loc      Pain Edu?      Excl. in [place]?    No data found.  Updated Vital Signs BP 125/66 (BP Location: Left Arm)   Pulse 85   Temp (!) 97.5 F (36.4 C) (Oral)   Resp 18   SpO2 96%   Physical Exam Constitutional:      General: She is not in acute distress.    Appearance: Normal appearance. She is well-developed. She is not toxic-appearing or diaphoretic.  HENT:     Head: Normocephalic and atraumatic.     Comments: Contusion along right orbital bone/cheek with tenderness to palpation of zygomatic area. Eyes:     Extraocular Movements: Extraocular movements intact.     Conjunctiva/sclera: Conjunctivae normal.     Pupils: Pupils are equal, round, and reactive to light.  Pulmonary:     Effort: Pulmonary effort is  normal. No respiratory distress.  Musculoskeletal:     Cervical back: Normal range of motion and neck supple.  Skin:    General: Skin is warm and dry.  Neurological:     Mental Status: She is alert and oriented to person, place, and time.      UC Treatments / Results  Labs (all labs ordered are listed, but only abnormal results are displayed) Labs Reviewed - No data to display  EKG   Radiology No results found.  Procedures Procedures (including critical care time)  Medications Ordered in UC Medications - No data to display  Initial Impression / Assessment and Plan / UC Course  I have reviewed the triage vital signs and the nursing notes.  Pertinent labs & imaging results that were available during my care of the patient were reviewed by me and considered in my medical decision making (see chart for details).    86 year old female with fall 30 minutes prior to arrival.  Head injury without loss of consciousness.  Has contusion and swelling to the right cheek/face with pain palpation to the zygomatic area.  EOMI.  Given >23 years old with head injury, patient discharged in stable condition to the ED for further evaluation.  Final Clinical Impressions(s) / UC Diagnoses   Final diagnoses:  Fall, initial encounter  Injury of head, initial encounter  Contusion of face, initial encounter  Acute pain of right shoulder  Thumb pain, left   ED Prescriptions    None     PDMP not reviewed this encounter.   Ok Edwards, PA-C 06/20/20 1538

## 2020-06-20 NOTE — ED Triage Notes (Signed)
Pt sent from UC for eval. Fall x 3 hrs ago , c/o facial and left hand injury , denies LOC

## 2020-06-20 NOTE — ED Provider Notes (Signed)
[facility] EMERGENCY DEPARTMENT Provider Note   CSN: [id number] Arrival date & time: 06/20/20  1728     History Chief Complaint  Patient presents with  . Fall    Cynthia Gilmore is a 86 y.o. female.  Cynthia Gilmore is 86yo female with DM2, HTN, previous TIA's who presents from urgent care after falling, hitting head on sidewalk. Patient reports she was walking her dog this afternoon when she stubbed her toe on the sidewalk and fell. Denies dizziness, CP, SOB, nausea, vomiting, abdominal pain. She states she hit her head but denies LOC. Otherwise, says she has pain in R shoulder, L thumb. She had slight headache after falling that subsided after taking Aleve. She went to urgent care who referred her to the ED for further imaging.      Past Medical History:  Diagnosis Date  . Allergy   . Anxiety   . Anxiety and depression    ? bipolar  . Cataract    bil cataracts removed  . Depression   . Diabetes mellitus, type 2 ([place])   . GERD (gastroesophageal reflux disease)   . Heart murmur   . Hemorrhoids 2012   . Hiatal hernia   . Hypertension   . IBS (irritable bowel syndrome)   . Lymphocytic colitis 2012  . Mumerous medication allergies and sensitivities 07/20/2015  . Osteoarthritis (arthritis due to wear and tear of joints)   . Segmental colitis ([place])    iscemic colitis 2009  . Stroke [facility])    states 2 "'mini strokes" 1980  . TIA (transient ischemic attack) 2000   x 3    Patient Active Problem List   Diagnosis Date Noted  . Subvalvular aortic stenosis 03/02/2017  . Low back pain radiating to both legs 06/06/2016  . Sinusitis, acute 11/27/2015  . Numerous medication allergies and sensitivities 07/20/2015  . Carpal tunnel syndrome 05/30/2015  . Dysuria 05/30/2015  . Preventative health care 10/13/2014  . Paroxysmal supraventricular tachycardia ([place]) 11/03/2013  . Palpitation 07/07/2012  . Lipoma stomach 06/03/2012  . Intermittent vertigo 10/08/2011  . GERD  (gastroesophageal reflux disease) 06/20/2011  . IBS (irritable bowel syndrome) 06/20/2011  . DEPRESSION/ANXIETY 11/11/2010  . Type II diabetes mellitus with neurological manifestations ([place]) 01/31/2010  . ALLERGIC RHINITIS 09/13/2009  . INSOMNIA, CHRONIC 05/24/2009  . MEMORY LOSS 05/24/2009  . ABDOMINAL PAIN-EPIGASTRIC 01/09/2009  . Lymphocytic colitis 07/19/2008  . PERSONAL HX COLONIC POLYPS 07/19/2008  . Essential hypertension 01/30/2007  . DEGENERATIVE DISC DISEASE 01/30/2007  . CARDIAC MURMUR, AORTIC 01/30/2007   Past Surgical History:  Procedure Laterality Date  . ANKLE SURGERY  1999   right ankle fracture surgery  . BLADDER REPAIR  1995  . CATARACT EXTRACTION, BILATERAL    . CHOLECYSTECTOMY  2004  . COLONOSCOPY W/ BIOPSIES AND POLYPECTOMY   07/29/2011   internal hemorrhoids, lymphocytic colitis  . ESOPHAGEAL MANOMETRY     Normal  . ESOPHAGOGASTRODUODENOSCOPY  07/29/2011   gastritis,hiatal hernia, tortuous esophagus  . EUS  06/03/2012   Procedure: UPPER ENDOSCOPIC ULTRASOUND (EUS) LINEAR;  Surgeon: Milus Banister, MD;  Location: [facility] ENDOSCOPY;  Service: Endoscopy;  Laterality: N/A;  . HERNIA REPAIR  1995  . PARTIAL HYSTERECTOMY  1992   partial    OB History   No obstetric history on file.    Family History  Problem Relation Age of Onset  . Diabetes Mother   . Heart disease Father   . Diabetes Maternal Aunt   . Diabetes Maternal Uncle   .  Heart disease Paternal Aunt   . Colon cancer Neg Hx    Social History   Tobacco Use  . Smoking status: Former Smoker    Types: Cigarettes  . Smokeless tobacco: Never Used  . Tobacco comment: stopped in 1962  Substance Use Topics  . Alcohol use: No    Alcohol/week: 0.0 standard drinks  . Drug use: No   Home Medications Prior to Admission medications   Medication Sig Start Date End Date Taking? Authorizing Provider  ALPRAZolam Duanne Moron) 0.25 MG tablet TAKE 1/2 TO 1 TABLET BY MOUTH ONCE DAILY AS NEEDED 03/23/17   Carollee Herter,  Alferd Apa, DO  anti-nausea (EMETROL) solution Take 5 mLs by mouth as needed for nausea or vomiting.    [provider]  Ascorbic Acid (VITAMIN C) 500 MG CAPS Take by mouth.    [provider]  escitalopram (LEXAPRO) 10 MG tablet Take 10 mg by mouth daily.    [provider]  famotidine (PEPCID) 40 MG tablet Take 1 tablet (40 mg total) by mouth 2 (two) times daily. 11/07/19   Gatha Mayer, MD  glucose blood (ACCU-CHEK SMARTVIEW) test strip 1 each by Other route daily. 10/16/14   Shawna Orleans, Doe-Hyun R, DO  hyoscyamine (ANASPAZ) 0.125 MG TBDP disintergrating tablet Place 1 tablet (0.125 mg total) under the tongue every 4 (four) hours as needed for cramping. 11/18/16   Gatha Mayer, MD  ibuprofen (ADVIL,MOTRIN) 200 MG tablet Take 200 mg by mouth every 6 (six) hours as needed.    [provider]  losartan (COZAAR) 100 MG tablet Take 100 mg by mouth daily.    [provider]  metFORMIN (GLUCOPHAGE-XR) 500 MG 24 hr tablet TAKE 1 TABLET(500 MG) BY MOUTH TWICE DAILY WITH A MEAL 06/28/18   Carollee Herter, Alferd Apa, DO  metoprolol succinate (TOPROL-XL) 25 MG 24 hr tablet Take 1 tablet (25 mg total) by mouth 2 (two) times daily. In addition to your 50mg  tablet to total 75mg  daily 03/23/20   Isaiah Serge, NP  metoprolol succinate (TOPROL-XL) 50 MG 24 hr tablet TAKE 1 TABLET BY MOUTH TWICE DAILY (take in addition to the 25mg  tablet to total 75mg  twice daily). TAKE WITH OR IMMEDIATELY FOLLOWING A MEAL 03/22/20   Belva Crome, MD  Misc Natural Products (ESTROVEN ENERGY PO) Take by mouth.    [provider]  ondansetron (ZOFRAN) 4 MG tablet TAKE 1 TABLET BY MOUTH FOUR TIMES DAILY( BEFORE A MEAL AND EVERY NIGHT AT BEDTIME) 09/17/18   Gatha Mayer, MD  spironolactone (ALDACTONE) 25 MG tablet Take 1.5 tablets (37.5 mg total) by mouth daily. 02/27/20   Belva Crome, MD  vitamin B-12 (CYANOCOBALAMIN) 1000 MCG tablet Take 2,000 mcg by mouth daily.    [provider]    Allergies    Alprazolam, Amlodipine besylate, Aripiprazole, Augmentin [amoxicillin-pot clavulanate], Azithromycin, Bupropion hcl, Cefaclor, Celecoxib, Cephalexin, Ciprofloxacin, Clonidine hydrochloride, Desvenlafaxine, Diphenoxylate-atropine, Divalproex sodium, Doxycycline, Entex, Est estrogens-methyltest, Fluoxetine hcl, Furosemide, Hydrochlorothiazide, Hydrocodone-acetaminophen, Lamotrigine, Lansoprazole, Lisinopril, Lorazepam, Meloxicam, Methylphenidate hcl, Metronidazole, Mirtazapine, Nabumetone, Nitrofuran derivatives, Olmesartan medoxomil, Omeprazole, Other, Oxycodone-acetaminophen, Pantoprazole sodium, Paroxetine, Quetiapine, Rabeprazole sodium, Rofecoxib, Sertraline hcl, Sumatriptan, Telithromycin, Topamax, Tramadol-acetaminophen, Trintellix [vortioxetine], Venlafaxine, Verapamil, and Vilazodone hcl [vilazodone hcl]  Review of Systems   Review of Systems  Physical Exam Updated Vital Signs BP (!) 138/67 (BP Location: Left Arm)   Pulse 60   Temp 98.3 F (36.8 C) (Oral)   Resp 16   Ht 5' 5.5" (1.664 m)  Wt 59.4 kg   SpO2 93%   BMI 21.47 kg/m   Physical Exam Constitutional:      General: She is awake. She is not in acute distress. HENT:     Head: Normocephalic. No raccoon eyes, abrasion or laceration.     Comments: Bruising on R side of face. Tender to palpation on R zygomatic bone.    Mouth/Throat:     Mouth: Mucous membranes are moist. No lacerations.  Eyes:     General: Lids are normal.     Conjunctiva/sclera: Conjunctivae normal.  Neck:     Comments: Patient reports mild pain when rotating head to R. No cervical spinal tenderness. Cardiovascular:     Rate and Rhythm: Normal rate and regular rhythm.     Pulses: Normal pulses.     Heart sounds: Normal heart sounds.  Pulmonary:     Effort: Pulmonary effort is normal.     Breath sounds: Normal breath sounds.  Abdominal:     General: Abdomen is flat. Bowel sounds are normal.     Palpations: Abdomen is  soft.     Tenderness: There is no abdominal tenderness.  Musculoskeletal:     Right shoulder: Swelling present.     Left shoulder: Normal.     Right lower leg: No edema.     Left lower leg: No edema.     Comments: Mild swelling and abrasion on R shoulder. Reports pain with adduction of R shoulder. L thumb bruised, decreased ROM. Pain on L thumb DIP, no laceration. Few cuts on R knee. No effusions or bony tenderness, ligament testing normal. No spinal point tenderness.  Skin:    General: Skin is warm and dry.     Capillary Refill: Capillary refill takes less than 2 seconds.  Neurological:     General: No focal deficit present.     Mental Status: She is alert and oriented to person, place, and time.  Psychiatric:        Speech: Speech normal.        Behavior: Behavior is cooperative.        Cognition and Memory: Cognition normal.     ED Results / Procedures / Treatments   Labs (all labs ordered are listed, but only abnormal results are displayed) Labs Reviewed - No data to display  EKG None  Radiology DG Shoulder Right  Result Date: 06/20/2020 CLINICAL DATA:  Fall EXAM: RIGHT SHOULDER - 2+ VIEW COMPARISON:  None. FINDINGS: There is no evidence of fracture or dislocation. There is no evidence of arthropathy or other focal bone abnormality. Soft tissues are unremarkable. IMPRESSION: Negative. Electronically Signed   By: Franchot Gallo M.D.   On: 06/20/2020 18:58   CT Head Wo Contrast  Result Date: 06/20/2020 CLINICAL DATA:  Fall.  Facial trauma. EXAM: CT HEAD WITHOUT CONTRAST CT MAXILLOFACIAL WITHOUT CONTRAST TECHNIQUE: Multidetector CT imaging of the head and maxillofacial structures were performed using the standard protocol without intravenous contrast. Multiplanar CT image reconstructions of the maxillofacial structures were also generated. COMPARISON:  CT head 07/30/2010 FINDINGS: CT HEAD FINDINGS Brain: Mild atrophy. Mild white matter changes which are likely chronic. Negative  for acute infarct, hemorrhage, mass. Vascular: Negative for hyperdense vessel Skull: Negative Other: None CT MAXILLOFACIAL FINDINGS Osseous: Negative for facial fracture. Orbits: No orbital mass or edema. Bilateral cataract extraction. No orbital fracture. Sinuses: Small air-fluid level right sphenoid sinus. Remaining sinuses clear. Mastoid clear bilaterally. Soft tissues: No significant soft tissue swelling. IMPRESSION: 1. No acute intracranial abnormality  2. Negative for facial fracture. Electronically Signed   By: Franchot Gallo M.D.   On: 06/20/2020 18:48   DG Finger Thumb Left  Result Date: 06/20/2020 CLINICAL DATA:  Fall EXAM: LEFT THUMB 2+V COMPARISON:  None. FINDINGS: Negative for fracture or dislocation. Degenerative change in the interphalangeal joint. Moderate degenerative change of the base of the thumb. IMPRESSION: Negative for fracture Electronically Signed   By: Franchot Gallo M.D.   On: 06/20/2020 18:59   CT Maxillofacial Wo Contrast  Result Date: 06/20/2020 CLINICAL DATA:  Fall.  Facial trauma. EXAM: CT HEAD WITHOUT CONTRAST CT MAXILLOFACIAL WITHOUT CONTRAST TECHNIQUE: Multidetector CT imaging of the head and maxillofacial structures were performed using the standard protocol without intravenous contrast. Multiplanar CT image reconstructions of the maxillofacial structures were also generated. COMPARISON:  CT head 07/30/2010 FINDINGS: CT HEAD FINDINGS Brain: Mild atrophy. Mild white matter changes which are likely chronic. Negative for acute infarct, hemorrhage, mass. Vascular: Negative for hyperdense vessel Skull: Negative Other: None CT MAXILLOFACIAL FINDINGS Osseous: Negative for facial fracture. Orbits: No orbital mass or edema. Bilateral cataract extraction. No orbital fracture. Sinuses: Small air-fluid level right sphenoid sinus. Remaining sinuses clear. Mastoid clear bilaterally. Soft tissues: No significant soft tissue swelling. IMPRESSION: 1. No acute intracranial abnormality 2.  Negative for facial fracture. Electronically Signed   By: Franchot Gallo M.D.   On: 06/20/2020 18:48    Procedures Procedures (including critical care time)  Medications Ordered in ED Medications - No data to display  ED Course  I have reviewed the triage vital signs and the nursing notes.  Pertinent labs & imaging results that were available during my care of the patient were reviewed by me and considered in my medical decision making (see chart for details).   MDM Rules/Calculators/A&P  Patient is 86yo female with HTN, DM2, hx of TIA who presents with fall, head injury w/o LOC. On exam, patient afebrile, HDS, no acute distress. No focal deficits, neurovascularly in tact. Tenderness to palpation on L thumb DIP, R shoulder, R zygomatic bone. Will scan for fractures, intracranial bleed.  CT head, CT maxillofacial negative. X-ray of L thumb, R shoulder negative. Patient's pain is mild, controlled with over the counter medications. She is stable and agreeable for discharge. Patient to follow up with primary care physician as needed.                        Final Clinical Impression(s) / ED Diagnoses Final diagnoses:  Fall, subsequent encounter  Acute pain of right shoulder  Injury of left thumb, subsequent encounter  Right facial pain    Rx / DC Orders ED Discharge Orders    None       Sanjuan Dame, MD 06/20/20 Kathyrn Drown    Drenda Freeze, MD 06/20/20 [phone]

## 2020-06-20 NOTE — Discharge Instructions (Addendum)
-  You can take Tylenol as needed for the pain. -Follow-up with your primary care doctor as needed.

## 2020-06-20 NOTE — ED Triage Notes (Signed)
Pt states stumped her lt big toe on her concrete driveway and fell on rt side 30 mins ago. Pt c/o pain, bruising, and abrasions to rt shoulder, rt face, lt thumb, and rt knee. Denies LOC

## 2020-07-29 ENCOUNTER — Other Ambulatory Visit: Payer: Self-pay | Admitting: Cardiology

## 2020-08-11 NOTE — Progress Notes (Signed)
Cardiology Office Note   Date:  08/16/2020   ID:  Cynthia Gilmore, DOB 01-11-1934, MRN [id number]  PCP:  Aurea Graff.Marlou Sa, MD  Cardiologist:   Kinsey Karch Martinique, MD   Chief Complaint  Patient presents with  . Palpitations      History of Present Illness: Cynthia Gilmore is a 86 y.o. female who presents for evaluation of SVT. Formerly seen by Dr Daneen Schick. She has a history of subvalvular Aortic stenosis with gradient. She also has history of anxiety disorder, DM type 2, and history of intermittent chest pain. She also has a history of nonsustained SVT and wide complex arrhythmia treated with Toprol XL.  She has multiple drug intolerances. On Lexapro for depression.   On follow up today she is doing OK. Notes she fell in the driveway in August and hit her right face, shoulder and thumb. Was seen in the ED and CT negative. Since then she has noted more problems with her balance particularly if she bends over.  Has not had any skipping in awhile on metoprolol. Sugars are good. No chest pain, dyspnea, edema. Complains of more depression. Is stressed caring for her husband who has dementia.    Past Medical History:  Diagnosis Date  . Allergy   . Anxiety   . Anxiety and depression    ? bipolar  . Cataract    bil cataracts removed  . Depression   . Diabetes mellitus, type 2 ([place])   . GERD (gastroesophageal reflux disease)   . Heart murmur   . Hemorrhoids 2012   . Hiatal hernia   . Hypertension   . IBS (irritable bowel syndrome)   . Lymphocytic colitis 2012  . Mumerous medication allergies and sensitivities 07/20/2015  . Osteoarthritis (arthritis due to wear and tear of joints)   . Segmental colitis ([place])    iscemic colitis 2009  . Stroke [facility])    states 2 "'mini strokes" 1980  . TIA (transient ischemic attack) 2000   x 3     Past Surgical History:  Procedure Laterality Date  . ANKLE SURGERY  1999   right ankle fracture surgery  . BLADDER REPAIR  1995  . CATARACT  EXTRACTION, BILATERAL    . CHOLECYSTECTOMY  2004  . COLONOSCOPY W/ BIOPSIES AND POLYPECTOMY   07/29/2011   internal hemorrhoids, lymphocytic colitis  . ESOPHAGEAL MANOMETRY     Normal  . ESOPHAGOGASTRODUODENOSCOPY  07/29/2011   gastritis,hiatal hernia, tortuous esophagus  . EUS  06/03/2012   Procedure: UPPER ENDOSCOPIC ULTRASOUND (EUS) LINEAR;  Surgeon: Milus Banister, MD;  Location: [facility] END[facility];  Service: Endoscopy;  Laterality: N/A;  . HERNIA REPAIR  1995  . PARTIAL HYSTERECTOMY  1992   partial     Current Outpatient Medications  Medication Sig Dispense Refill  . ALPRAZolam (XANAX) 0.25 MG tablet TAKE 1/2 TO 1 TABLET BY MOUTH ONCE DAILY AS NEEDED 30 tablet 0  . anti-nausea (EMETROL) solution Take 5 mLs by mouth as needed for nausea or vomiting.    . Ascorbic Acid (VITAMIN C) 500 MG CAPS Take by mouth.    . escitalopram (LEXAPRO) 10 MG tablet Take 10 mg by mouth daily.    . famotidine (PEPCID) 40 MG tablet Take 1 tablet (40 mg total) by mouth 2 (two) times daily. 180 tablet 3  . glucose blood (ACCU-CHEK SMARTVIEW) test strip 1 each by Other route daily. 100 each 12  . hyoscyamine (ANASPAZ) 0.125 MG TBDP disintergrating tablet Place 1  tablet (0.125 mg total) under the tongue every 4 (four) hours as needed for cramping. 90 tablet 5  . ibuprofen (ADVIL,MOTRIN) 200 MG tablet Take 200 mg by mouth every 6 (six) hours as needed.    Marland Kitchen losartan (COZAAR) 100 MG tablet Take 100 mg by mouth daily.    . metFORMIN (GLUCOPHAGE-XR) 500 MG 24 hr tablet TAKE 1 TABLET(500 MG) BY MOUTH TWICE DAILY WITH A MEAL 180 tablet 0  . metoprolol succinate (TOPROL-XL) 25 MG 24 hr tablet TAKE 1 TABLET(25 MG) BY MOUTH TWICE DAILY IN ADDITION TO YOUR 50 MG TABLET TO TOTAL 75 MG DAILY 60 tablet 3  . metoprolol succinate (TOPROL-XL) 50 MG 24 hr tablet TAKE 1 TABLET BY MOUTH TWICE DAILY (take in addition to the 25mg  tablet to total 75mg  twice daily). TAKE WITH OR IMMEDIATELY FOLLOWING A MEAL 180 tablet 3  . Misc Natural  Products (ESTROVEN ENERGY PO) Take by mouth.    . ondansetron (ZOFRAN) 4 MG tablet TAKE 1 TABLET BY MOUTH FOUR TIMES DAILY( BEFORE A MEAL AND EVERY NIGHT AT BEDTIME) 120 tablet 11  . spironolactone (ALDACTONE) 25 MG tablet Take 1.5 tablets (37.5 mg total) by mouth daily. 135 tablet 3   No current facility-administered medications for this visit.    Allergies:   Alprazolam, Amlodipine besylate, Aripiprazole, Augmentin [amoxicillin-pot clavulanate], Azithromycin, Bupropion hcl, Cefaclor, Celecoxib, Cephalexin, Ciprofloxacin, Clonidine hydrochloride, Desvenlafaxine, Diphenoxylate-atropine, Divalproex sodium, Doxycycline, Entex, Est estrogens-methyltest, Fluoxetine hcl, Furosemide, Hydrochlorothiazide, Hydrocodone-acetaminophen, Lamotrigine, Lansoprazole, Lisinopril, Lorazepam, Meloxicam, Methylphenidate hcl, Metronidazole, Mirtazapine, Nabumetone, Nitrofuran derivatives, Olmesartan medoxomil, Omeprazole, Other, Oxycodone-acetaminophen, Pantoprazole sodium, Paroxetine, Quetiapine, Rabeprazole sodium, Rofecoxib, Sertraline hcl, Sumatriptan, Telithromycin, Topamax, Tramadol-acetaminophen, Trintellix [vortioxetine], Venlafaxine, Verapamil, and Vilazodone hcl [vilazodone hcl]    Social History:  The patient  reports that she has quit smoking. Her smoking use included cigarettes. She has never used smokeless tobacco. She reports that she does not drink alcohol and does not use drugs.   Family History:  The patient's family history includes Diabetes in her maternal aunt, maternal uncle, and mother; Heart disease in her father and paternal aunt.    ROS:  Please see the history of present illness.   Otherwise, review of systems are positive for .   All other systems are reviewed and negative.    PHYSICAL EXAM: VS:  BP 120/75   Pulse 74   Temp (!) 97.3 F (36.3 C)   Ht 5\' 5"  (1.651 m)   Wt 131 lb 12.8 oz (59.8 kg)   SpO2 (!) 79%   BMI 21.93 kg/m  , BMI Body mass index is 21.93 kg/m. GEN: Well  nourished, well developed, in no acute distress  HEENT: normal  Neck: no JVD, carotid bruits, or masses Cardiac: RRR; no  rubs, or gallops,no edema  Soft 1/6 systolic murmur RUSB Respiratory:  clear to auscultation bilaterally, normal work of breathing GI: soft, nontender, nondistended, + BS MS: no deformity or atrophy  Skin: warm and dry, no rash Neuro:  Strength and sensation are intact Psych: euthymic mood, full affect   EKG:  EKG is ordered today. The ekg ordered today demonstrates NSR rate 74. Incomplete RBBB. I have personally reviewed and interpreted this study.    Recent Labs: No results found for requested labs within last 8760 hours.    Lipid Panel    Component Value Date/Time   CHOL 196 03/24/2017 1048   TRIG 209.0 (H) 03/24/2017 1048   HDL 49.40 03/24/2017 1048   CHOLHDL 4 03/24/2017 1048   VLDL 41.8 (H)  03/24/2017 1048   LDLCALC 98 12/19/2016 1457   LDLDIRECT 124.0 03/24/2017 1048      Wt Readings from Last 3 Encounters:  08/16/20 131 lb 12.8 oz (59.8 kg)  06/20/20 131 lb (59.4 kg)  02/15/20 143 lb (64.9 kg)      Other studies Reviewed: Additional studies/ records that were reviewed today include:   Echo 11/22/13: Study Conclusions   - Left ventricle: The cavity size was normal. There was mild  focal basal hypertrophy of the septum. Systolic function  was vigorous. The estimated ejection fraction was in the  range of 65% to 70%. Wall motion was normal; there were no  regional wall motion abnormalities.  - Aortic valve: Mild sub aortic valve gradient.  - Mitral valve: There was systolic anterior motion. Mild  regurgitation.   Myoview 03/17/17: Study Highlights    Nuclear stress EF: 81%.  Blood pressure demonstrated a normal response to exercise.  There was no ST segment deviation noted during stress.  Defect 1: There is a small defect of moderate severity present in the basal inferior location.  This is a low risk study.  The  left ventricular ejection fraction is hyperdynamic (>65%).   Low risk stress nuclear study with mild inferobasal thinning; no significant ischemia (minimal reversibility felt unlikely to be significant); EF 81 with normal wall motion.    Event monitor 03/17/17: Study Highlights    The underlying basic rhythm is normal sinus rhythm  No sustained arrhythmias, either tachycardia or bradycardia were noted.  Multiple events of "palpitation" and "flutter ", were reported but do not correlate with significant arrhythmia.  On one occasion "palpitation" was associated with ventricular bigeminy   Event monitor 06/14/19: Study Highlights   NSR is basic rhythm  Non sustained WCT lasting up to 12 seconds with rate 154-170 bpm  Nonsustained SVT  PAC's and PVC's      ASSESSMENT AND PLAN:  1. PSVT/ wide complex arrythmia. Well controlled on low dose metoprolol.   2. HTN essential- controlled  3. Subvalvular aortic stenosis- mild obstruction and asymptomatic.     Current medicines are reviewed at length with the patient today.  The patient does not have concerns regarding medicines.  The following changes have been made:  no change  Labs/ tests ordered today include:  No orders of the defined types were placed in this encounter.    Disposition:   FU with me in one  year  Signed, Shavaughn Seidl Martinique, MD  08/16/2020 4:22 PM    [facility] [address] [place], [place], [address] Phone [phone], Fax [phone]

## 2020-08-16 ENCOUNTER — Encounter: Payer: Self-pay | Admitting: Cardiology

## 2020-08-16 ENCOUNTER — Ambulatory Visit: Payer: Medicare Other | Admitting: Cardiology

## 2020-08-16 ENCOUNTER — Other Ambulatory Visit: Payer: Self-pay

## 2020-08-16 VITALS — BP 120/75 | HR 74 | Temp 97.3°F | Ht 65.0 in | Wt 131.8 lb

## 2020-08-16 DIAGNOSIS — Q244 Congenital subaortic stenosis: Secondary | ICD-10-CM | POA: Diagnosis not present

## 2020-08-16 DIAGNOSIS — I471 Supraventricular tachycardia: Secondary | ICD-10-CM | POA: Diagnosis not present

## 2020-08-16 DIAGNOSIS — I10 Essential (primary) hypertension: Secondary | ICD-10-CM | POA: Diagnosis not present

## 2020-10-26 ENCOUNTER — Other Ambulatory Visit: Payer: Self-pay

## 2020-10-26 DIAGNOSIS — K580 Irritable bowel syndrome with diarrhea: Secondary | ICD-10-CM

## 2020-10-26 MED ORDER — ONDANSETRON HCL 4 MG PO TABS: ORAL_TABLET | ORAL | 6 refills | Status: DC

## 2020-10-27 ENCOUNTER — Other Ambulatory Visit: Payer: Self-pay | Admitting: Interventional Cardiology

## 2020-10-29 NOTE — Telephone Encounter (Signed)
Pt sees Dr. Martinique now, please refill.

## 2020-12-26 ENCOUNTER — Other Ambulatory Visit: Payer: Self-pay | Admitting: Interventional Cardiology

## 2020-12-26 DIAGNOSIS — E119 Type 2 diabetes mellitus without complications: Secondary | ICD-10-CM | POA: Diagnosis not present

## 2020-12-26 DIAGNOSIS — H43812 Vitreous degeneration, left eye: Secondary | ICD-10-CM | POA: Diagnosis not present

## 2020-12-26 DIAGNOSIS — H04123 Dry eye syndrome of bilateral lacrimal glands: Secondary | ICD-10-CM | POA: Diagnosis not present

## 2020-12-26 DIAGNOSIS — H40023 Open angle with borderline findings, high risk, bilateral: Secondary | ICD-10-CM | POA: Diagnosis not present

## 2020-12-26 DIAGNOSIS — H02005 Unspecified entropion of left lower eyelid: Secondary | ICD-10-CM | POA: Diagnosis not present

## 2020-12-26 MED ORDER — METOPROLOL SUCCINATE ER 25 MG PO TB24: ORAL_TABLET | ORAL | 2 refills | Status: DC

## 2021-01-17 DIAGNOSIS — E139 Other specified diabetes mellitus without complications: Secondary | ICD-10-CM | POA: Diagnosis not present

## 2021-01-17 DIAGNOSIS — B351 Tinea unguium: Secondary | ICD-10-CM | POA: Diagnosis not present

## 2021-01-17 DIAGNOSIS — M79672 Pain in left foot: Secondary | ICD-10-CM | POA: Diagnosis not present

## 2021-01-17 DIAGNOSIS — I739 Peripheral vascular disease, unspecified: Secondary | ICD-10-CM | POA: Diagnosis not present

## 2021-01-17 DIAGNOSIS — M79671 Pain in right foot: Secondary | ICD-10-CM | POA: Diagnosis not present

## 2021-01-25 ENCOUNTER — Other Ambulatory Visit: Payer: Self-pay | Admitting: Cardiology

## 2021-01-25 ENCOUNTER — Other Ambulatory Visit: Payer: Self-pay | Admitting: Internal Medicine

## 2021-01-31 DIAGNOSIS — M25571 Pain in right ankle and joints of right foot: Secondary | ICD-10-CM | POA: Diagnosis not present

## 2021-02-19 ENCOUNTER — Other Ambulatory Visit: Payer: Self-pay | Admitting: Interventional Cardiology

## 2021-03-07 ENCOUNTER — Telehealth: Payer: Self-pay | Admitting: Internal Medicine

## 2021-03-07 DIAGNOSIS — R5383 Other fatigue: Secondary | ICD-10-CM | POA: Diagnosis not present

## 2021-03-07 DIAGNOSIS — E119 Type 2 diabetes mellitus without complications: Secondary | ICD-10-CM | POA: Diagnosis not present

## 2021-03-07 DIAGNOSIS — I10 Essential (primary) hypertension: Secondary | ICD-10-CM | POA: Diagnosis not present

## 2021-03-07 DIAGNOSIS — K589 Irritable bowel syndrome without diarrhea: Secondary | ICD-10-CM | POA: Diagnosis not present

## 2021-03-07 DIAGNOSIS — K219 Gastro-esophageal reflux disease without esophagitis: Secondary | ICD-10-CM | POA: Diagnosis not present

## 2021-03-07 DIAGNOSIS — E7800 Pure hypercholesterolemia, unspecified: Secondary | ICD-10-CM | POA: Diagnosis not present

## 2021-03-07 NOTE — Telephone Encounter (Signed)
Good afternoon Dr.Gessner and Dr. Henrene Pastor, patient requested to transfer to Dr. Henrene Pastor back in 2017 and she is wanting to schedule an appointment.  Want to make sure ok with you both to schedule with Dr. Henrene Pastor.  Please advise.  Thank you.

## 2021-03-07 NOTE — Telephone Encounter (Signed)
Ok

## 2021-03-07 NOTE — Telephone Encounter (Signed)
Still ok.

## 2021-03-11 ENCOUNTER — Ambulatory Visit: Payer: Medicare Other | Admitting: Internal Medicine

## 2021-03-11 ENCOUNTER — Encounter: Payer: Self-pay | Admitting: Internal Medicine

## 2021-03-11 VITALS — BP 124/64 | HR 60 | Ht 64.37 in | Wt 145.1 lb

## 2021-03-11 DIAGNOSIS — R1319 Other dysphagia: Secondary | ICD-10-CM

## 2021-03-11 DIAGNOSIS — K219 Gastro-esophageal reflux disease without esophagitis: Secondary | ICD-10-CM | POA: Diagnosis not present

## 2021-03-11 DIAGNOSIS — K580 Irritable bowel syndrome with diarrhea: Secondary | ICD-10-CM

## 2021-03-11 DIAGNOSIS — R110 Nausea: Secondary | ICD-10-CM

## 2021-03-11 NOTE — Patient Instructions (Signed)
If you are age 85 or older, your body mass index should be between 23-30. Your Body mass index is 24.63 kg/m. If this is out of the aforementioned range listed, please consider follow up with your Primary Care Provider.  If you are age 23 or younger, your body mass index should be between 19-25. Your Body mass index is 24.63 kg/m. If this is out of the aformentioned range listed, please consider follow up with your Primary Care Provider.    You have been scheduled for an endoscopy. Please follow written instructions given to you at your visit today. If you use inhalers (even only as needed), please bring them with you on the day of your procedure.

## 2021-03-11 NOTE — Telephone Encounter (Signed)
Thank you both.  She is scheduled with an APP on 03/25/21 since it was the soonest appt but will schedule with Dr. Henrene Pastor going forward.

## 2021-03-11 NOTE — Progress Notes (Signed)
HISTORY OF PRESENT ILLNESS:  Cynthia Gilmore is a pleasant 87 y.o. female, wife of Barnabas Lister, who presents today with a myriad of GI complaints.  She is accompanied by her daughter.  Patient has a history of irritable bowel syndrome with preponderance towards diarrhea, GERD with multiple intolerances to PPIs, and chronic intermittent nausea.  Previous patient of Dr. Velora Heckler and Dr. Carlean Purl.  Transferring her care at this time as I take care of multiple of her relatives.  I have reviewed previous office evaluations with Dr. Carlean Purl as well as prior EGD, EUS, and colonoscopy.  She also has a history of microscopic colitis.  First, the patient tells me that she is currently taking Pepcid 40 mg twice daily.  Despite this she does have some epigastric discomfort and reflux symptoms such as heartburn.  She describes a 79-month history of problems with occasional choking spells on liquids.  She also describes rare but significant discomfort with swallowing solids at times.  She points to the proximal esophagus.  She does continue with nausea for which she takes Zofran.  She generally has formed bowel movements daily.  However about once per month she will have diarrhea with urgency and occasional incontinence.  She is careful not to eat when she is out.  She does use Imodium as needed.  No bleeding.  Last complete colonoscopy with biopsies performed 2012.  Last upper endoscopy June 2013.  2 cm hiatal hernia at that time.  Submucosal nodule found to be lipoma on EUS.  She has listed 49 allergies and/or drug intolerances  REVIEW OF SYSTEMS:  All non-GI ROS negative unless otherwise stated in HPI except for anxiety, arthritis, back pain, cough, depression, fatigue, headaches, itching, muscle cramps, night sweats, excessive urination, urinary leakage, insomnia  Past Medical History:  Diagnosis Date  . Allergy   . Anxiety   . Anxiety and depression    ? bipolar  . Cataract    bil cataracts removed  . Depression    . Diabetes mellitus, type 2 ([place])   . GERD (gastroesophageal reflux disease)   . Heart murmur   . Hemorrhoids 2012   . Hiatal hernia   . Hypertension   . IBS (irritable bowel syndrome)   . Lymphocytic colitis 2012  . Mumerous medication allergies and sensitivities 07/20/2015  . Osteoarthritis (arthritis due to wear and tear of joints)   . Segmental colitis ([place])    iscemic colitis 2009  . Stroke [facility])    states 2 "'mini strokes" 1980  . TIA (transient ischemic attack) 2000   x 3     Past Surgical History:  Procedure Laterality Date  . ANKLE SURGERY  1999   right ankle fracture surgery  . BLADDER REPAIR  1995  . CATARACT EXTRACTION, BILATERAL    . CHOLECYSTECTOMY  2004  . COLONOSCOPY W/ BIOPSIES AND POLYPECTOMY   07/29/2011   internal hemorrhoids, lymphocytic colitis  . ESOPHAGEAL MANOMETRY     Normal  . ESOPHAGOGASTRODUODENOSCOPY  07/29/2011   gastritis,hiatal hernia, tortuous esophagus  . EUS  06/03/2012   Procedure: UPPER ENDOSCOPIC ULTRASOUND (EUS) LINEAR;  Surgeon: Milus Banister, MD;  Location: [facility];  Service: Endoscopy;  Laterality: N/A;  . HERNIA REPAIR  1995  . PARTIAL HYSTERECTOMY  1992   partial    Social History Teneisha Gignac  reports that she has quit smoking. Her smoking use included cigarettes. She has never used smokeless tobacco. She reports that she does not drink alcohol and does  not use drugs.  family history includes Diabetes in her maternal aunt, maternal uncle, and mother; Heart disease in her father and paternal aunt.  Allergies  Allergen Reactions  . Alprazolam     REACTION: sleepy  . Amlodipine Besylate     REACTION: swelling  . Aripiprazole   . Augmentin [Amoxicillin-Pot Clavulanate] Diarrhea  . Azithromycin   . Bupropion Hcl     REACTION: insomnia  . Cefaclor     REACTION: mouth blisters  . Celecoxib     REACTION: "drained"  . Cephalexin   . Ciprofloxacin   . Clonidine Hydrochloride     REACTION: headache, dizziness  .  Desvenlafaxine   . Diphenoxylate-Atropine   . Divalproex Sodium     REACTION: nausea,tremors,fatigue  . Doxycycline   . Entex     REACTION: nausea, headache  . Est Estrogens-Methyltest     REACTION: headache  . Fluoxetine Hcl   . Furosemide   . Hydrochlorothiazide   . Hydrocodone-Acetaminophen     REACTION: headache  . Lamotrigine     REACTION: mood swing,chills, nausea  . Lansoprazole   . Lisinopril     REACTION: cough  . Lorazepam   . Meloxicam     REACTION: nausea, diarrhea  . Methylphenidate Hcl   . Metronidazole   . Mirtazapine     REACTION: swelling  . Nabumetone     REACTION: nausea, headache  . Nitrofuran Derivatives Nausea Only  . Olmesartan Medoxomil   . Omeprazole Diarrhea  . Other     Alum and Mag Hydroxide Simeth(Solution) GI Cocktail. Diarrhea   . Oxycodone-Acetaminophen   . Pantoprazole Sodium   . Paroxetine     REACTION: tremor, insomnia, nausea  . Quetiapine   . Rabeprazole Sodium     REACTION: diarrhea  . Rofecoxib   . Sertraline Hcl     REACTION: exreme confusion  . Sumatriptan     REACTION: cardiologist says "no"  . Telithromycin     REACTION: "drained"  . Topamax     Confusion and mood swings  . Tramadol-Acetaminophen   . Trintellix [Vortioxetine] Other (See Comments)    "Made things worst"  . Venlafaxine     REACTION: hypertension, nausea, headache  . Verapamil     Headaches and hot flashes   . Vilazodone Hcl [Vilazodone Hcl]        PHYSICAL EXAMINATION: Vital signs: BP 124/64 (BP Location: Left Arm, Patient Position: Sitting, Cuff Size: Normal)   Pulse 60   Ht 5' 4.37" (1.635 m) Comment: height measured without shoes  Wt 145 lb 2 oz (65.8 kg)   BMI 24.63 kg/m   Constitutional: generally well-appearing, no acute distress Psychiatric: alert and oriented x3, cooperative Eyes: extraocular movements intact, anicteric, conjunctiva pink Mouth: oral pharynx moist, no lesions Neck: supple no lymphadenopathy Cardiovascular: heart  regular rate and rhythm, no murmur Lungs: clear to auscultation bilaterally Abdomen: soft, nontender, nondistended, no obvious ascites, no peritoneal signs, normal bowel sounds, no organomegaly Rectal: Omitted Extremities: no clubbing, cyanosis, or lower extremity edema bilaterally Skin: no lesions on visible extremities Neuro: No focal deficits.  Cranial nerves intact  ASSESSMENT:  1.  Chronic GERD.  Active symptoms on twice daily H2 receptor antagonist therapy 2.  Epigastric pain.  Likely due to the same 3.  Normal normal oropharyngeal dysphagia to liquids and rare painful swallowing of solids at times.  Rule out stricture.  Rule out hypertensive cricopharyngeus or cricopharyngeal bar 4.  History of irritable bowel syndrome with tendency toward  diarrhea 5.  History of microscopic colitis 6.  Multiple allergies 7.  Chronic nausea   PLAN:  1.  Reflux precautions 2.  Continue famotidine 40 mg twice daily 3.  Schedule upper endoscopy to evaluate swallowing issues, epigastric pain, and active reflux symptoms. The nature of the procedure, as well as the risks, benefits, and alternatives were carefully and thoroughly reviewed with the patient. Ample time for discussion and questions allowed. The patient understood, was satisfied, and agreed to proceed. 4.  Zofran as needed for nausea 5.  Antidiarrheals as needed 6.  Could consider low-dose Colestid if issues with diarrhea more problematic.  Currently infrequent.  She constipated past with regular dose Questran.

## 2021-03-25 ENCOUNTER — Ambulatory Visit: Payer: Medicare Other | Admitting: Nurse Practitioner

## 2021-05-23 ENCOUNTER — Encounter: Payer: Medicare Other | Admitting: Internal Medicine

## 2021-06-12 DIAGNOSIS — Z20822 Contact with and (suspected) exposure to covid-19: Secondary | ICD-10-CM | POA: Diagnosis not present

## 2021-07-19 ENCOUNTER — Other Ambulatory Visit: Payer: Self-pay | Admitting: Cardiology

## 2021-07-22 ENCOUNTER — Other Ambulatory Visit: Payer: Self-pay | Admitting: Internal Medicine

## 2021-08-18 ENCOUNTER — Other Ambulatory Visit: Payer: Self-pay | Admitting: Cardiology

## 2021-09-09 ENCOUNTER — Ambulatory Visit: Payer: Medicare Other | Admitting: Cardiology

## 2021-09-09 DIAGNOSIS — I10 Essential (primary) hypertension: Secondary | ICD-10-CM | POA: Diagnosis not present

## 2021-09-09 DIAGNOSIS — R5383 Other fatigue: Secondary | ICD-10-CM | POA: Diagnosis not present

## 2021-09-09 DIAGNOSIS — E119 Type 2 diabetes mellitus without complications: Secondary | ICD-10-CM | POA: Diagnosis not present

## 2021-09-09 DIAGNOSIS — E7800 Pure hypercholesterolemia, unspecified: Secondary | ICD-10-CM | POA: Diagnosis not present

## 2021-09-09 DIAGNOSIS — K589 Irritable bowel syndrome without diarrhea: Secondary | ICD-10-CM | POA: Diagnosis not present

## 2021-09-09 DIAGNOSIS — Z23 Encounter for immunization: Secondary | ICD-10-CM | POA: Diagnosis not present

## 2021-09-09 DIAGNOSIS — K219 Gastro-esophageal reflux disease without esophagitis: Secondary | ICD-10-CM | POA: Diagnosis not present

## 2021-09-26 DIAGNOSIS — Z6823 Body mass index (BMI) 23.0-23.9, adult: Secondary | ICD-10-CM | POA: Diagnosis not present

## 2021-09-26 DIAGNOSIS — Z1231 Encounter for screening mammogram for malignant neoplasm of breast: Secondary | ICD-10-CM | POA: Diagnosis not present

## 2021-10-14 ENCOUNTER — Other Ambulatory Visit: Payer: Self-pay | Admitting: Cardiology

## 2021-11-11 ENCOUNTER — Ambulatory Visit: Payer: Medicare Other | Admitting: Cardiology

## 2021-12-31 DIAGNOSIS — Z961 Presence of intraocular lens: Secondary | ICD-10-CM | POA: Diagnosis not present

## 2021-12-31 DIAGNOSIS — E119 Type 2 diabetes mellitus without complications: Secondary | ICD-10-CM | POA: Diagnosis not present

## 2021-12-31 DIAGNOSIS — H43393 Other vitreous opacities, bilateral: Secondary | ICD-10-CM | POA: Diagnosis not present

## 2021-12-31 DIAGNOSIS — H02831 Dermatochalasis of right upper eyelid: Secondary | ICD-10-CM | POA: Diagnosis not present

## 2021-12-31 DIAGNOSIS — G43B0 Ophthalmoplegic migraine, not intractable: Secondary | ICD-10-CM | POA: Diagnosis not present

## 2021-12-31 DIAGNOSIS — H04123 Dry eye syndrome of bilateral lacrimal glands: Secondary | ICD-10-CM | POA: Diagnosis not present

## 2021-12-31 DIAGNOSIS — H02005 Unspecified entropion of left lower eyelid: Secondary | ICD-10-CM | POA: Diagnosis not present

## 2021-12-31 DIAGNOSIS — H40023 Open angle with borderline findings, high risk, bilateral: Secondary | ICD-10-CM | POA: Diagnosis not present

## 2021-12-31 DIAGNOSIS — H43812 Vitreous degeneration, left eye: Secondary | ICD-10-CM | POA: Diagnosis not present

## 2021-12-31 DIAGNOSIS — H02834 Dermatochalasis of left upper eyelid: Secondary | ICD-10-CM | POA: Diagnosis not present

## 2022-01-13 NOTE — Progress Notes (Signed)
Cardiology Office Note   Date:  01/20/2022   ID:  Cynthia Gilmore, DOB [birth year], MRN [id number]  PCP:  Aurea Graff.Marlou Sa, MD  Cardiologist:   Monserath Neff [place], MD   Chief Complaint  Patient presents with   Follow-up    1 year.   Palpitations      History of Present Illness: Cynthia Gilmore is a 88 y.o. female who presents for evaluation of SVT. Formerly seen by Dr Daneen Schick. She has a history of subvalvular Aortic stenosis with gradient. She also has history of anxiety disorder, DM type 2, and history of intermittent chest pain. She also has a history of nonsustained SVT and wide complex arrhythmia treated with Toprol XL.  She has multiple drug intolerances. On Lexapro for depression.   On follow up today she is doing OK. She is under a lot of stress. Feels nauseated a lot. Notes HR between 60-103. No palpitations that she can tell. BP readings have been higher recently but this correlates with higher levels of stress. Had Covid last July. Has not regained taste of appetite.    Past Medical History:  Diagnosis Date   Allergy    Anxiety    Anxiety and depression    ? bipolar   Cataract    bil cataracts removed   Depression    Diabetes mellitus, type 2 ([facility])    GERD (gastroesophageal reflux disease)    Heart murmur    Hemorrhoids 2012    Hiatal hernia    Hypertension    IBS (irritable bowel syndrome)    Lymphocytic colitis 2012   Mumerous medication allergies and sensitivities 07/20/2015   Osteoarthritis (arthritis due to wear and tear of joints)    Segmental colitis ([place])    iscemic colitis 2009   Stroke [facility])    states 2 "'mini strokes" 1980   TIA (transient ischemic attack) 2000   x 3     Past Surgical History:  Procedure Laterality Date   ANKLE SURGERY  1999   right ankle fracture surgery   [place], BILATERAL     CHOLECYSTECTOMY  2004   COLONOSCOPY W/ BIOPSIES AND POLYPECTOMY   07/29/2011   internal hemorrhoids,  lymphocytic colitis   ESOPHAGEAL MANOMETRY     Normal   ESOPHAGOGASTRODUODENOSCOPY  07/29/2011   gastritis,hiatal hernia, tortuous esophagus   EUS  06/03/2012   Procedure: UPPER ENDOSCOPIC ULTRASOUND (EUS) LINEAR;  Surgeon: Milus Banister, MD;  Location: [facility];  Service: Endoscopy;  Laterality: N/A;   HERNIA REPAIR  1995   PARTIAL HYSTERECTOMY  1992   partial     Current Outpatient Medications  Medication Sig Dispense Refill   ALPRAZolam (XANAX) 0.25 MG tablet TAKE 1/2 TO 1 TABLET BY MOUTH ONCE DAILY AS NEEDED 30 tablet 0   anti-nausea (EMETROL) solution Take 5 mLs by mouth as needed for nausea or vomiting.     Ascorbic Acid (VITAMIN C) 500 MG CAPS Take by mouth.     famotidine (PEPCID) 40 MG tablet TAKE 1 TABLET(40 MG) BY MOUTH TWICE DAILY 180 tablet 3   glucose blood (ACCU-CHEK SMARTVIEW) test strip 1 each by Other route daily. 100 each 12   hyoscyamine (ANASPAZ) 0.125 MG TBDP disintergrating tablet Place 1 tablet (0.125 mg total) under the tongue every 4 (four) hours as needed for cramping. 90 tablet 5   ibuprofen (ADVIL,MOTRIN) 200 MG tablet Take 200 mg by mouth every 6 (six) hours as needed.  losartan (COZAAR) 100 MG tablet Take 100 mg by mouth daily.     metFORMIN (GLUCOPHAGE-XR) 500 MG 24 hr tablet TAKE 1 TABLET(500 MG) BY MOUTH TWICE DAILY WITH A MEAL 180 tablet 0   metoprolol succinate (TOPROL-XL) 25 MG 24 hr tablet TAKE 1 TABLET(25 MG) BY MOUTH TWICE DAILY IN ADDITION TO YOUR 50 MG TABLET TO TOTAL 75 MG DAILY 60 tablet 7   metoprolol succinate (TOPROL-XL) 50 MG 24 hr tablet TAKE 1 TABLET BY MOUTH TWICE DAILY IN ADDITION TO THE 25 MG TABLET TO TOTAL 75 MG 2 TIMES DAILY. TAKE WITH OR IMMEDIATELY FOLLOWING A MEAL 180 tablet 2   Misc Natural Products (ESTROVEN ENERGY PO) Take by mouth.     ondansetron (ZOFRAN) 4 MG tablet TAKE 1 TABLET BY MOUTH FOUR TIMES DAILY( BEFORE A MEAL AND EVERY NIGHT AT BEDTIME) 120 tablet 6   spironolactone (ALDACTONE) 25 MG tablet TAKE 1 AND 1/2  TABLETS(37.5 MG) BY MOUTH DAILY 135 tablet 1   TRINTELLIX 10 MG TABS tablet Take 20 mg by mouth daily.     No current facility-administered medications for this visit.    Allergies:   Alprazolam, Amlodipine besylate, Aripiprazole, Augmentin [amoxicillin-pot clavulanate], Azithromycin, Bupropion hcl, Cefaclor, Celecoxib, Cephalexin, Ciprofloxacin, Clonidine hydrochloride, Desvenlafaxine, Diphenoxylate-atropine, Divalproex sodium, Doxycycline, Entex, Est estrogens-methyltest, Fluoxetine hcl, Furosemide, Hydrochlorothiazide, Hydrocodone-acetaminophen, Lamotrigine, Lansoprazole, Lisinopril, Lorazepam, Meloxicam, Methylphenidate hcl, Metronidazole, Mirtazapine, Nabumetone, Nitrofuran derivatives, Olmesartan medoxomil, Omeprazole, Other, Oxycodone-acetaminophen, Pantoprazole sodium, Paroxetine, Quetiapine, Rabeprazole sodium, Rofecoxib, Sertraline hcl, Sumatriptan, Telithromycin, Topamax, Tramadol-acetaminophen, Trintellix [vortioxetine], Venlafaxine, Verapamil, and Vilazodone hcl [vilazodone hcl]    Social History:  The patient  reports that she has quit smoking. Her smoking use included cigarettes. She has never used smokeless tobacco. She reports that she does not drink alcohol and does not use drugs.   Family History:  The patient's family history includes Diabetes in her maternal aunt, maternal uncle, and mother; Heart disease in her father and paternal aunt.    ROS:  Please see the history of present illness.   Otherwise, review of systems are positive for .   All other systems are reviewed and negative.    PHYSICAL EXAM: VS:  BP (!) 136/58 (BP Location: Right Arm, Patient Position: Sitting, Cuff Size: Normal)    Pulse (!) 56    Ht 5\' 4"  (1.626 m)    Wt 132 lb (59.9 kg)    BMI 22.66 kg/m  , BMI Body mass index is 22.66 kg/m. GEN: Well nourished, well developed, in no acute distress  HEENT: normal  Neck: no JVD, carotid bruits, or masses Cardiac: RRR; no  rubs, or gallops,no edema  Soft 1/6  systolic murmur RUSB Respiratory:  clear to auscultation bilaterally, normal work of breathing GI: soft, nontender, nondistended, + BS MS: no deformity or atrophy  Skin: warm and dry, no rash Neuro:  Strength and sensation are intact Psych: euthymic mood, full affect   EKG:  EKG is ordered today. The ekg ordered today demonstrates Junctional rhythm rate 56. Low voltage. I have personally reviewed and interpreted this study.    Recent Labs: No results found for requested labs within last 8760 hours.    Lipid Panel    Component Value Date/Time   CHOL 196 03/24/2017 1048   TRIG 209.0 (H) 03/24/2017 1048   HDL 49.40 03/24/2017 1048   CHOLHDL 4 03/24/2017 1048   VLDL 41.8 (H) 03/24/2017 1048   LDLCALC 98 12/19/2016 1457   LDLDIRECT 124.0 03/24/2017 1048      Wt Readings  from Last 3 Encounters:  01/20/22 132 lb (59.9 kg)  03/11/21 145 lb 2 oz (65.8 kg)  08/16/20 131 lb 12.8 oz (59.8 kg)      Other studies Reviewed: Additional studies/ records that were reviewed today include:   Echo 11/22/13: Study Conclusions   - Left ventricle: The cavity size was normal. There was mild    focal basal hypertrophy of the septum. Systolic function    was vigorous. The estimated ejection fraction was in the    range of 65% to 70%. Wall motion was normal; there were no    regional wall motion abnormalities.  - Aortic valve: Mild sub aortic valve gradient.  - Mitral valve: There was systolic anterior motion. Mild    regurgitation.   Myoview 03/17/17: Study Highlights    Nuclear stress EF: 81%. Blood pressure demonstrated a normal response to exercise. There was no ST segment deviation noted during stress. Defect 1: There is a small defect of moderate severity present in the basal inferior location. This is a low risk study. The left ventricular ejection fraction is hyperdynamic (>65%).   Low risk stress nuclear study with mild inferobasal thinning; no significant ischemia (minimal  reversibility felt unlikely to be significant); EF 81 with normal wall motion.    Event monitor 03/17/17: Study Highlights    The underlying basic rhythm is normal sinus rhythm No sustained arrhythmias, either tachycardia or bradycardia were noted. Multiple events of "palpitation" and "flutter ", were reported but do not correlate with significant arrhythmia. On one occasion "palpitation" was associated with ventricular bigeminy   Event monitor 06/14/19: Study Highlights  NSR is basic rhythm Non sustained WCT lasting up to 12 seconds with rate 154-170 bpm Nonsustained SVT PAC's and PVC's      ASSESSMENT AND PLAN:  1. PSVT/ wide complex arrythmia. Well controlled on metoprolol. Unable to increase dose further with slow HR today. Sometimes her HR will increase to 103 but she is asymptomatic. Will request copy of most recent lab work  2. HTN essential- elevated recently due to stress. On Toprol xl, losartan and aldactone. Will continue to monitor. If BP remains elevated may need to add low dose amlodipine.   3. Subvalvular aortic stenosis- mild obstruction and asymptomatic.     Current medicines are reviewed at length with the patient today.  The patient does not have concerns regarding medicines.  The following changes have been made:  no change  Labs/ tests ordered today include:  No orders of the defined types were placed in this encounter.    Disposition:   FU with me in one  year  Signed, Jochebed Bills [place], MD  01/20/2022 3:14 PM    [facility] [address], [place], [place], [address] Phone [phone], Fax [phone]

## 2022-01-15 ENCOUNTER — Other Ambulatory Visit: Payer: Self-pay | Admitting: Cardiology

## 2022-01-20 ENCOUNTER — Ambulatory Visit: Payer: Medicare Other | Admitting: Cardiology

## 2022-01-20 ENCOUNTER — Other Ambulatory Visit: Payer: Self-pay

## 2022-01-20 ENCOUNTER — Encounter: Payer: Self-pay | Admitting: Cardiology

## 2022-01-20 VITALS — BP 136/58 | HR 56 | Ht 64.0 in | Wt 132.0 lb

## 2022-01-20 DIAGNOSIS — Q244 Congenital subaortic stenosis: Secondary | ICD-10-CM

## 2022-01-20 DIAGNOSIS — I10 Essential (primary) hypertension: Secondary | ICD-10-CM | POA: Diagnosis not present

## 2022-01-20 DIAGNOSIS — I471 Supraventricular tachycardia: Secondary | ICD-10-CM

## 2022-02-04 DIAGNOSIS — M21962 Unspecified acquired deformity of left lower leg: Secondary | ICD-10-CM | POA: Diagnosis not present

## 2022-02-04 DIAGNOSIS — M21961 Unspecified acquired deformity of right lower leg: Secondary | ICD-10-CM | POA: Diagnosis not present

## 2022-02-04 DIAGNOSIS — E139 Other specified diabetes mellitus without complications: Secondary | ICD-10-CM | POA: Diagnosis not present

## 2022-02-04 DIAGNOSIS — M792 Neuralgia and neuritis, unspecified: Secondary | ICD-10-CM | POA: Diagnosis not present

## 2022-02-04 DIAGNOSIS — M21612 Bunion of left foot: Secondary | ICD-10-CM | POA: Diagnosis not present

## 2022-02-04 DIAGNOSIS — M2041 Other hammer toe(s) (acquired), right foot: Secondary | ICD-10-CM | POA: Diagnosis not present

## 2022-02-04 DIAGNOSIS — M2042 Other hammer toe(s) (acquired), left foot: Secondary | ICD-10-CM | POA: Diagnosis not present

## 2022-02-04 DIAGNOSIS — M2012 Hallux valgus (acquired), left foot: Secondary | ICD-10-CM | POA: Diagnosis not present

## 2022-02-14 ENCOUNTER — Other Ambulatory Visit: Payer: Self-pay | Admitting: Cardiology

## 2022-03-10 DIAGNOSIS — I10 Essential (primary) hypertension: Secondary | ICD-10-CM | POA: Diagnosis not present

## 2022-03-10 DIAGNOSIS — Z0000 Encounter for general adult medical examination without abnormal findings: Secondary | ICD-10-CM | POA: Diagnosis not present

## 2022-03-10 DIAGNOSIS — E7800 Pure hypercholesterolemia, unspecified: Secondary | ICD-10-CM | POA: Diagnosis not present

## 2022-03-10 DIAGNOSIS — K589 Irritable bowel syndrome without diarrhea: Secondary | ICD-10-CM | POA: Diagnosis not present

## 2022-03-10 DIAGNOSIS — E119 Type 2 diabetes mellitus without complications: Secondary | ICD-10-CM | POA: Diagnosis not present

## 2022-03-10 DIAGNOSIS — R300 Dysuria: Secondary | ICD-10-CM | POA: Diagnosis not present

## 2022-03-10 DIAGNOSIS — Z23 Encounter for immunization: Secondary | ICD-10-CM | POA: Diagnosis not present

## 2022-03-10 DIAGNOSIS — R5383 Other fatigue: Secondary | ICD-10-CM | POA: Diagnosis not present

## 2022-03-10 DIAGNOSIS — K219 Gastro-esophageal reflux disease without esophagitis: Secondary | ICD-10-CM | POA: Diagnosis not present

## 2022-03-10 DIAGNOSIS — N3000 Acute cystitis without hematuria: Secondary | ICD-10-CM | POA: Diagnosis not present

## 2022-04-07 ENCOUNTER — Other Ambulatory Visit: Payer: Self-pay | Admitting: Internal Medicine

## 2022-04-07 DIAGNOSIS — K580 Irritable bowel syndrome with diarrhea: Secondary | ICD-10-CM

## 2022-04-15 ENCOUNTER — Telehealth: Payer: Self-pay | Admitting: Internal Medicine

## 2022-04-15 NOTE — Telephone Encounter (Signed)
Patient called requesting to speak with a nurse regarding having diarrhea for about 2 months now said the medication given is no longer helping her. Seeking further advise.

## 2022-04-15 NOTE — Telephone Encounter (Signed)
Spoke with pt and she is aware of Dr. Blanch Media recommendations. States the zofran had been helping up until recently. She will try the Citrucel and Imodium. Pt scheduled to see Dr. Henrene Pastor 05/30/22'@3'$ :40pm. She is aware of appt.

## 2022-04-15 NOTE — Telephone Encounter (Signed)
1.  Zofran is not an antidiarrheal. 2.  Add Citrucel 2 tablespoons daily to bulk up stools 3.  Imodium as directed 4.  Office follow-up in 4 weeks

## 2022-04-15 NOTE — Telephone Encounter (Signed)
Pt states that she has been having diarrhea on and off for about 2 months now. States that she has been taking zofran 4 mg qid and it had been working but does not seem to be handling things now. Reports about 30 min after she eats she has watery diarrhea and she has had a few episodes where she couldn't make it to the bathroom in time. She wondered if perhaps increasing the dose of the zofran might help. Please advise.

## 2022-04-24 NOTE — Telephone Encounter (Signed)
Patient called stating the recommendations she was given her not helping and is seeking advise on what to do moving forward. Patient stated she would like a call before 12 if possible due to having to take her husband to an appointment. Please advise.

## 2022-04-24 NOTE — Telephone Encounter (Signed)
Pt reports she is still having diarrhea even after taking Imodium and adding the Citrucel as instructed. Pt requesting to be seen sooner. Pts appt moved to 05/05/22 at 3:40pm with Dr. Henrene Pastor. Pt aware of appt.

## 2022-05-05 ENCOUNTER — Encounter: Payer: Self-pay | Admitting: Internal Medicine

## 2022-05-05 ENCOUNTER — Other Ambulatory Visit: Payer: Medicare Other

## 2022-05-05 ENCOUNTER — Ambulatory Visit: Payer: Medicare Other | Admitting: Internal Medicine

## 2022-05-05 VITALS — BP 110/70 | HR 63 | Ht 64.0 in | Wt 123.0 lb

## 2022-05-05 DIAGNOSIS — R1319 Other dysphagia: Secondary | ICD-10-CM | POA: Diagnosis not present

## 2022-05-05 DIAGNOSIS — K580 Irritable bowel syndrome with diarrhea: Secondary | ICD-10-CM | POA: Diagnosis not present

## 2022-05-05 DIAGNOSIS — R197 Diarrhea, unspecified: Secondary | ICD-10-CM | POA: Diagnosis not present

## 2022-05-05 MED ORDER — HYOSCYAMINE SULFATE 0.125 MG PO TBDP: 0.1250 mg | ORAL_TABLET | ORAL | 5 refills | Status: DC | PRN

## 2022-05-05 MED ORDER — BUDESONIDE 3 MG PO CPEP: 3.0000 mg | ORAL_CAPSULE | Freq: Every day | ORAL | 6 refills | Status: DC

## 2022-05-05 NOTE — Progress Notes (Signed)
HISTORY OF PRESENT ILLNESS:  Cynthia Gilmore is a 88 y.o. female, wife of Cynthia Gilmore, who presents today regarding persistent diarrhea.  She has a history of diarrhea predominant irritable bowel syndrome, GERD, multiple intolerances to PPIs, chronic intermittent nausea, and lymphocytic colitis (colonoscopy by Dr. Carlean Gilmore 2012).  I saw the patient for the first time in April 2022 regarding chronic GERD, epigastric pain, and dysphagia.  She was continued on famotidine twice daily and scheduled for upper endoscopy.  Upper endoscopy was was canceled by the patient due to her husband becoming ill.  She contacted this office about 3 weeks ago complaining of diarrhea.  Citrucel and Imodium were recommended.  Follow-up in 4 weeks recommended.  That appointment was moved up to today.  She is accompanied by her husband.  Her daughter joins [place] by telephone.  Patient reports developing severe watery diarrhea about 3 months ago.  This has persisted.  5 or 6 bowel movements per day.  Nocturnal symptoms.  Some abdominal cramping for which she takes Levsin.  Request medication refill.  She has been on metformin long-term.  This was actually reduced 3 weeks ago.  No change in diarrhea.  No fevers.  Some mild weight loss.  Cannot quantify.  Next, she reports problems with dysphagia such as last year.  Some mild odynophagia.  She points to the cervical esophagus.  She and her daughter wonder about endoscopy.  REVIEW OF SYSTEMS:  All non-GI ROS negative unless otherwise stated in the HPI except for sinus and allergy, anxiety, arthritis, depression, fatigue, hearing problems, itching, muscle cramps, night sweats, sleeping problems, sore throat, urinary leakage, voice change, heart murmur  Past Medical History:  Diagnosis Date   Allergy    Anxiety    Anxiety and depression    ? bipolar   Cataract    bil cataracts removed   Depression    Diabetes mellitus, type 2 (HCC)    GERD (gastroesophageal reflux disease)    Heart  murmur    Hemorrhoids 2012    Hiatal hernia    Hypertension    IBS (irritable bowel syndrome)    Lymphocytic colitis 2012   Mumerous medication allergies and sensitivities 07/20/2015   Osteoarthritis (arthritis due to wear and tear of joints)    Segmental colitis ([place])    iscemic colitis 2009   Stroke [facility])    states 2 "'mini strokes" 1980   TIA (transient ischemic attack) 2000   x 3     Past Surgical History:  Procedure Laterality Date   ANKLE SURGERY  1999   right ankle fracture surgery   BLADDER REPAIR  1995   CATARACT EXTRACTION, BILATERAL     CHOLECYSTECTOMY  2004   COLONOSCOPY W/ BIOPSIES AND POLYPECTOMY   07/29/2011   internal hemorrhoids, lymphocytic colitis   ESOPHAGEAL MANOMETRY     Normal   ESOPHAGOGASTRODUODENOSCOPY  07/29/2011   gastritis,hiatal hernia, tortuous esophagus   EUS  06/03/2012   Procedure: UPPER ENDOSCOPIC ULTRASOUND (EUS) LINEAR;  Surgeon: Milus Banister, MD;  Location: [facility];  Service: Endoscopy;  Laterality: N/A;   [place]   PARTIAL HYSTERECTOMY  1992   partial    Social History Cynthia Gilmore  reports that she has quit smoking. Her smoking use included cigarettes. She has never used smokeless tobacco. She reports that she does not drink alcohol and does not use drugs.  family history includes Diabetes in her maternal aunt, maternal uncle, and mother; Heart disease in  her father and paternal aunt.  Allergies  Allergen Reactions   Alprazolam     REACTION: sleepy   Amlodipine Besylate     REACTION: swelling   Aripiprazole    Augmentin [Amoxicillin-Pot Clavulanate] Diarrhea   Azithromycin    Bupropion Hcl     REACTION: insomnia   Cefaclor     REACTION: mouth blisters   Celecoxib     REACTION: "drained"   Cephalexin    Ciprofloxacin    Clonidine Hydrochloride     REACTION: headache, dizziness   Desvenlafaxine    Diphenoxylate-Atropine    Divalproex Sodium     REACTION: nausea,tremors,fatigue   Doxycycline     Entex     REACTION: nausea, headache   Est Estrogens-Methyltest     REACTION: headache   Fluoxetine Hcl    Furosemide    Hydrochlorothiazide    Hydrocodone-Acetaminophen     REACTION: headache   Lamotrigine     REACTION: mood swing,chills, nausea   Lansoprazole    Lisinopril     REACTION: cough   Lorazepam    Meloxicam     REACTION: nausea, diarrhea   Methylphenidate Hcl    Metronidazole    Mirtazapine     REACTION: swelling   Nabumetone     REACTION: nausea, headache   Nitrofuran Derivatives Nausea Only   Olmesartan Medoxomil    Omeprazole Diarrhea   Other     Alum and Mag Hydroxide Simeth(Solution) GI Cocktail. Diarrhea    Oxycodone-Acetaminophen    Pantoprazole Sodium    Paroxetine     REACTION: tremor, insomnia, nausea   Quetiapine    Rabeprazole Sodium     REACTION: diarrhea   Rofecoxib    Sertraline Hcl     REACTION: exreme confusion   Sumatriptan     REACTION: cardiologist says "no"   Telithromycin     REACTION: "drained"   Topamax     Confusion and mood swings   Tramadol-Acetaminophen    Trintellix [Vortioxetine] Other (See Comments)    "Made things worst"   Venlafaxine     REACTION: hypertension, nausea, headache   Verapamil     Headaches and hot flashes    Vilazodone Hcl [Vilazodone Hcl]        PHYSICAL EXAMINATION: Vital signs: BP 110/70   Pulse 63   Ht '5\' 4"'$  (1.626 m)   Wt 123 lb (55.8 kg)   BMI 21.11 kg/m   Constitutional: Thin, frail but generally well-appearing, no acute distress Psychiatric: alert and oriented x3, cooperative Eyes: extraocular movements intact, anicteric, conjunctiva pink Mouth: oral pharynx moist, no lesions Neck: supple no lymphadenopathy Cardiovascular: heart regular rate and rhythm Lungs: clear to auscultation bilaterally Abdomen: soft, nontender, nondistended, no obvious ascites, no peritoneal signs, normal bowel sounds, no organomegaly Rectal: Omitted Extremities: no clubbing, cyanosis, or lower extremity  edema bilaterally Skin: no lesions on visible extremities Neuro: No focal deficits.  Cranial nerves intact  ASSESSMENT:  1.  88-monthhistory of watery diarrhea as described.  She does have a history of irritable bowel syndrome, but seems quite different.  I am most concerned about recurrence of her lymphocytic colitis.  Rule out infection. 2.  Dysphagia 3.  Multiple medical problems and advanced age   PLAN:  160  Stool studies for enteric pathogens 2.  Refill sublingual Levsin.  Medication risk reviewed 3.  Prescribe budesonide 9 mg daily.  Medication risks reviewed.  Continue this dosage until follow-up. 4.  Continue Imodium as needed 5.  Barium swallow  with tablet to evaluate dysphagia 6.  Office follow-up 4 weeks.  Contact the office in the interim for questions or problems. Total time of 40 minutes was spent preparing to see the patient, obtaining comprehensive history, performing medically appropriate physical examination, counseling the patient and her family regarding the above listed issues, ordering medications, ordering advanced radiology study, ordering microbiology study, arranging follow-up, and documenting clinical information in the health record

## 2022-05-05 NOTE — Patient Instructions (Signed)
If you are age 86 or older, your body mass index should be between 23-30. Your Body mass index is 21.11 kg/m. If this is out of the aforementioned range listed, please consider follow up with your Primary Care Provider.  If you are age 22 or younger, your body mass index should be between 19-25. Your Body mass index is 21.11 kg/m. If this is out of the aformentioned range listed, please consider follow up with your Primary Care Provider.   ________________________________________________________  The [place] [facility] providers would like to encourage you to use [facility] to communicate with providers for non-urgent requests or questions.  Due to long hold times on the telephone, sending your provider a message by [facility] may be a faster and more efficient way to get a response.  Please allow 48 business hours for a response.  Please remember that this is for non-urgent requests.  _______________________________________________________  We have sent the following medications to your pharmacy for you to pick up at your convenience:  Hyoscyamine, Budesonide  Your provider has requested that you go to the basement level for lab work before leaving today. Press "B" on the elevator. The lab is located at the first door on the left as you exit the elevator.  You have been scheduled for a Barium Esophogram at [facility] (1st floor of the hospital) on 05/16/2022 at 11:00am. Please arrive 15 minutes prior to your appointment for registration. Make certain not to have anything to eat or drink 3 hours prior to your test. If you need to reschedule for any reason, please contact radiology at [phone] to do so. __________________________________________________________________ A barium swallow is an examination that concentrates on views of the esophagus. This tends to be a double contrast exam (barium and two liquids which, when combined, create a gas to distend the wall of the oesophagus) or single  contrast (non-ionic iodine based). The study is usually tailored to your symptoms so a good history is essential. Attention is paid during the study to the form, structure and configuration of the esophagus, looking for functional disorders (such as aspiration, dysphagia, achalasia, motility and reflux) EXAMINATION You may be asked to change into a gown, depending on the type of swallow being performed. A radiologist and radiographer will perform the procedure. The radiologist will advise you of the type of contrast selected for your procedure and direct you during the exam. You will be asked to stand, sit or lie in several different positions and to hold a small amount of fluid in your mouth before being asked to swallow while the imaging is performed .In some instances you may be asked to swallow barium coated marshmallows to assess the motility of a solid food bolus. The exam can be recorded as a digital or video fluoroscopy procedure. POST PROCEDURE It will take 1-2 days for the barium to pass through your system. To facilitate this, it is important, unless otherwise directed, to increase your fluids for the next 24-48hrs and to resume your normal diet.  This test typically takes about 30 minutes to perform. _________________________________________________________  Please follow up on _________________________

## 2022-05-06 ENCOUNTER — Telehealth: Payer: Self-pay | Admitting: Internal Medicine

## 2022-05-06 ENCOUNTER — Other Ambulatory Visit: Payer: Medicare Other

## 2022-05-06 DIAGNOSIS — K52839 Microscopic colitis, unspecified: Secondary | ICD-10-CM | POA: Diagnosis not present

## 2022-05-06 DIAGNOSIS — A09 Infectious gastroenteritis and colitis, unspecified: Secondary | ICD-10-CM | POA: Diagnosis not present

## 2022-05-06 DIAGNOSIS — R197 Diarrhea, unspecified: Secondary | ICD-10-CM

## 2022-05-06 DIAGNOSIS — K580 Irritable bowel syndrome with diarrhea: Secondary | ICD-10-CM | POA: Diagnosis not present

## 2022-05-06 MED ORDER — BUDESONIDE 3 MG PO CPEP: 9.0000 mg | ORAL_CAPSULE | Freq: Every day | ORAL | 6 refills | Status: DC

## 2022-05-06 NOTE — Telephone Encounter (Signed)
Inbound call from patients daughter inquiring about medication Budesonide that was prescribed yesterday. Please give her a call back at [phone].

## 2022-05-06 NOTE — Telephone Encounter (Signed)
Spoke with patient's daughter who noticed that we told patient to take '9mg'$  of Budesonide daily but the rx was sent for '3mg'$ .  I apologized for the mistake and resent the prescription for the correct dosage of '9mg'$  daily

## 2022-05-08 LAB — GI PROFILE, STOOL, PCR

## 2022-05-10 ENCOUNTER — Telehealth: Payer: Self-pay | Admitting: Pharmacy Technician

## 2022-05-10 NOTE — Telephone Encounter (Signed)
Patient Advocate Encounter  Received notification from [employer] that prior authorization for HYOSCYAMINE 0.'125MG'$  is required.   PA submitted on 6.17.23 [facility] Status is pending    Luciano Cutter, CPhT Patient Advocate Phone: [phone]

## 2022-05-13 NOTE — Telephone Encounter (Signed)
Received a fax regarding Prior Authorization from [facility] for [place] 0.'125MG'$ . Authorization has been DENIED because this medication is excluded from Medicare coverage by law, and the do not offer the drug as a supplemental benefit. A covered alternative for this medication was not provided by the plan.

## 2022-05-15 ENCOUNTER — Telehealth: Payer: Self-pay | Admitting: Internal Medicine

## 2022-05-15 NOTE — Telephone Encounter (Signed)
Spoke to patient's mother - let her know I didn't know of anyone who had called from this office.  She will let me know if she hears anything else.

## 2022-05-15 NOTE — Telephone Encounter (Signed)
Patient's daughter called stating patient missed a phone call from someone in this office.  She was unsure if it was in regards to the test she's having done at [facility] tomorrow or for some other reason.  I did not see any notes regarding either.  Please call the daughter and advise.  Thank you.

## 2022-05-16 ENCOUNTER — Ambulatory Visit (HOSPITAL_COMMUNITY)
Admission: RE | Admit: 2022-05-16 | Discharge: 2022-05-16 | Disposition: A | Discharge status: Home / Self Care | Payer: Medicare Other | Source: Ambulatory Visit | Attending: Internal Medicine | Admitting: Internal Medicine

## 2022-05-16 ENCOUNTER — Telehealth: Payer: Self-pay | Admitting: Internal Medicine

## 2022-05-16 DIAGNOSIS — K580 Irritable bowel syndrome with diarrhea: Secondary | ICD-10-CM | POA: Diagnosis not present

## 2022-05-16 DIAGNOSIS — K224 Dyskinesia of esophagus: Secondary | ICD-10-CM | POA: Diagnosis not present

## 2022-05-16 DIAGNOSIS — R197 Diarrhea, unspecified: Secondary | ICD-10-CM | POA: Insufficient documentation

## 2022-05-16 DIAGNOSIS — K219 Gastro-esophageal reflux disease without esophagitis: Secondary | ICD-10-CM | POA: Diagnosis not present

## 2022-05-16 DIAGNOSIS — R1310 Dysphagia, unspecified: Secondary | ICD-10-CM | POA: Diagnosis not present

## 2022-05-16 NOTE — Telephone Encounter (Signed)
Patient returned your call.

## 2022-05-16 NOTE — Telephone Encounter (Signed)
See result notes, pt aware.

## 2022-05-19 MED ORDER — PANTOPRAZOLE SODIUM 40 MG PO TBEC: 40.0000 mg | DELAYED_RELEASE_TABLET | Freq: Every day | ORAL | 3 refills | Status: DC

## 2022-05-19 NOTE — Telephone Encounter (Signed)
Spoke with pts daughter and she states that since her mother started taking budesonide her stools are back to normal but the medications seems to have cause her reflux to start bothering her. She is taking pepcid 40mg  bid. She wanted to know if this is a normal side effect of the budesonide and if so what Dr. Marina Goodell recommends. Please advise.

## 2022-05-28 NOTE — Telephone Encounter (Signed)
Patients daughter Mateo Flow called requesting to speak with a nurse regarding the medications for Gerd symptoms.

## 2022-05-28 NOTE — Telephone Encounter (Signed)
Patient's daughter called and said that while the Budesonide is working very well for her, it is $100 out of pocket which is somewhat cost prohibitive for her.  She wanted to know if there is anything else she could take.  Also, she is scheduled for an office visit Friday and wants to know if this is absolutely necessary as her husband cannot be left alone and it is very difficult to get him out of the house.  Please advise.

## 2022-05-28 NOTE — Telephone Encounter (Signed)
1.  I am sorry about the cost.  There are no alternatives.  She must continue on this medication. 2.  She needs to see me in the office to review her condition and direct therapy moving forward.  This is very important.

## 2022-05-30 ENCOUNTER — Ambulatory Visit: Payer: Medicare Other | Admitting: Internal Medicine

## 2022-05-30 ENCOUNTER — Telehealth: Payer: Self-pay | Admitting: Internal Medicine

## 2022-05-30 MED ORDER — BUDESONIDE 3 MG PO CPEP: 9.0000 mg | ORAL_CAPSULE | Freq: Every day | ORAL | 6 refills | Status: DC

## 2022-05-30 NOTE — Telephone Encounter (Signed)
Relayed Dr. Blanch Media answers to patient's daughter and clarified that her office visit is scheduled for 06/18/2022 at 3:20.  Patient's daughter agreed.

## 2022-05-30 NOTE — Telephone Encounter (Signed)
Spoke with pharmacy who thought patient was filling rx too soon because the one they had was for one a day.  I sent in a new prescription reflecting taking 3 pills a day and they got rid of the incorrect rx.  Called patient and her daughter to let them know.

## 2022-06-18 ENCOUNTER — Encounter: Payer: Self-pay | Admitting: Internal Medicine

## 2022-06-18 ENCOUNTER — Ambulatory Visit: Payer: Medicare Other | Admitting: Internal Medicine

## 2022-06-18 VITALS — BP 116/64 | HR 75 | Ht 65.0 in | Wt 125.8 lb

## 2022-06-18 DIAGNOSIS — R197 Diarrhea, unspecified: Secondary | ICD-10-CM

## 2022-06-18 DIAGNOSIS — R1319 Other dysphagia: Secondary | ICD-10-CM

## 2022-06-18 DIAGNOSIS — K52839 Microscopic colitis, unspecified: Secondary | ICD-10-CM | POA: Diagnosis not present

## 2022-06-18 MED ORDER — BUDESONIDE 3 MG PO CPEP: 9.0000 mg | ORAL_CAPSULE | Freq: Every day | ORAL | 3 refills | Status: DC

## 2022-06-18 NOTE — Progress Notes (Signed)
HISTORY OF PRESENT ILLNESS:  Cynthia Gilmore is a 88 y.o. female with past medical history as listed below who was evaluated in this office May 05, 2022 regarding a 86-monthhistory of watery diarrhea.  Stool studies were negative.  She does have a history of lymphocytic colitis and was prescribed budesonide 9 mg daily.  She was also complaining of dysphagia at that time and underwent barium swallow with tablet.  No significant abnormalities.  May have mild dysmotility.  She presents today for follow-up.  She is accompanied by her husband Cynthia Gilmore  Patient tells me that shortly after initiating budesonide therapy her diarrhea improved.  She actually was having some issues with constipation for which she was taking stool softeners.  She did have loose stools on 1 occasion after eating a rich meal.  She has maintained herself on budesonide 9 mg daily as recommended.  Her main dysphagia continues.  I reviewed the esophagram findings with her.  REVIEW OF SYSTEMS:  All non-GI ROS negative unless otherwise stated in the HPI except for anxiety, arthritis, back pain, cough, depression, fatigue, hearing problems, irregular heartbeat, night sweats, itching, muscle cramps, excessive urination with urinary leakage, and insomnia  Past Medical History:  Diagnosis Date   Allergy    Anxiety    Anxiety and depression    ? bipolar   Cataract    bil cataracts removed   Depression    Diabetes mellitus, type 2 (HCC)    GERD (gastroesophageal reflux disease)    Heart murmur    Hemorrhoids 2012    Hiatal hernia    Hypertension    IBS (irritable bowel syndrome)    Lymphocytic colitis 2012   Mumerous medication allergies and sensitivities 07/20/2015   Osteoarthritis (arthritis due to wear and tear of joints)    [place]    iscemic colitis 2009   Stroke ([facility]    states 2 "'mini strokes" 1980   TIA (transient ischemic attack) 2000   x 3     Past Surgical History:  Procedure Laterality Date    ANKLE SURGERY  1999   right ankle fracture surgery   BLADDER REPAIR  1995   CATARACT EXTRACTION, BILATERAL     CHOLECYSTECTOMY  2004   COLONOSCOPY W/ BIOPSIES AND POLYPECTOMY   07/29/2011   internal hemorrhoids, lymphocytic colitis   ESOPHAGEAL MANOMETRY     Normal   ESOPHAGOGASTRODUODENOSCOPY  07/29/2011   gastritis,hiatal hernia, tortuous esophagus   EUS  06/03/2012   Procedure: UPPER ENDOSCOPIC ULTRASOUND (EUS) LINEAR;  Surgeon: DMilus Banister MD;  Location: [facility];  Service: Endoscopy;  Laterality: N/A;   [place]  PARTIAL HYSTERECTOMY  1992   partial    Social History Cynthia Gilmore reports that she has quit smoking. Her smoking use included cigarettes. She has never used smokeless tobacco. She reports that she does not drink alcohol and does not use drugs.  family history includes Diabetes in her maternal aunt, maternal uncle, and mother; Heart attack in her father; Heart disease in her father and paternal aunt.  Allergies  Allergen Reactions   Alprazolam     REACTION: sleepy   Amlodipine Besylate     REACTION: swelling   Aripiprazole    Augmentin [Amoxicillin-Pot Clavulanate] Diarrhea   Azithromycin    Bupropion Hcl     REACTION: insomnia   Cefaclor     REACTION: mouth blisters   Celecoxib     REACTION: "drained"  Cephalexin    Ciprofloxacin    Clonidine Hydrochloride     REACTION: headache, dizziness   Desvenlafaxine    Diphenoxylate-Atropine    Divalproex Sodium     REACTION: nausea,tremors,fatigue   Doxycycline    Entex     REACTION: nausea, headache   Est Estrogens-Methyltest     REACTION: headache   Fluoxetine Hcl    Furosemide    Hydrochlorothiazide    Hydrocodone-Acetaminophen     REACTION: headache   Lamotrigine     REACTION: mood swing,chills, nausea   Lansoprazole    Lisinopril     REACTION: cough   Lorazepam    Meloxicam     REACTION: nausea, diarrhea   Methylphenidate Hcl    Metronidazole    Mirtazapine      REACTION: swelling   Nabumetone     REACTION: nausea, headache   Nitrofuran Derivatives Nausea Only   Olmesartan Medoxomil    Omeprazole Diarrhea   Other     Alum and Mag Hydroxide Simeth(Solution) GI Cocktail. Diarrhea    Oxycodone-Acetaminophen    Pantoprazole Sodium    Paroxetine     REACTION: tremor, insomnia, nausea   Quetiapine    Rabeprazole Sodium     REACTION: diarrhea   Rofecoxib    Sertraline Hcl     REACTION: exreme confusion   Sumatriptan     REACTION: cardiologist says "no"   Telithromycin     REACTION: "drained"   Topamax     Confusion and mood swings   Tramadol-Acetaminophen    Trintellix [Vortioxetine] Other (See Comments)    "Made things worst"   Venlafaxine     REACTION: hypertension, nausea, headache   Verapamil     Headaches and hot flashes    Vilazodone Hcl [Vilazodone Hcl]        PHYSICAL EXAMINATION: Vital signs: BP 116/64   Pulse 75   Ht '5\' 5"'$  (1.651 m)   Wt 125 lb 12.8 oz (57.1 kg)   SpO2 97%   BMI 20.93 kg/m   Constitutional: generally well-appearing, no acute distress Psychiatric: alert and oriented x3, cooperative Eyes: extraocular movements intact, anicteric, conjunctiva pink Mouth: oral pharynx moist, no lesions Neck: supple no lymphadenopathy Cardiovascular: heart regular rate and rhythm, no murmur Lungs: clear to auscultation bilaterally Abdomen: soft, nontender, nondistended, no obvious ascites, no peritoneal signs, normal bowel sounds, no organomegaly Rectal: Omitted Extremities: no clubbing, cyanosis, or lower extremity edema bilaterally Skin: no lesions on visible extremities Neuro: No focal deficits.  Cranial nerves intact  ASSESSMENT:  1.  Lymphocytic colitis.  Diarrhea improved on budesonide. 2.  Dysphagia.  No significant abnormalities on esophagram. 3.  Multiple medical problems of end-stage   PLAN:  1.  Decrease budesonide to 6 mg daily for 1 month then, 3 mg daily for 1 month, then stop. 2.  Office  follow-up 3 months 3.  Contact the office in the interim for any questions or problems A total time of 30 minutes was spent preparing to see the patient, obtaining interval history, performing medically appropriate physical examination, counseling the patient and her husband regarding her above conditions, ordering medication and reviewing side effects, arranging follow-up, and documenting clinical information in the health record

## 2022-06-18 NOTE — Patient Instructions (Signed)
If you are age 86 or older, your body mass index should be between 23-30. Your Body mass index is 20.93 kg/m. If this is out of the aforementioned range listed, please consider follow up with your Primary Care Provider.  If you are age 18 or younger, your body mass index should be between 19-25. Your Body mass index is 20.93 kg/m. If this is out of the aformentioned range listed, please consider follow up with your Primary Care Provider.   ________________________________________________________  The [place] [facility] providers would like to encourage you to use [facility] to communicate with providers for non-urgent requests or questions.  Due to long hold times on the telephone, sending your provider a message by [facility] may be a faster and more efficient way to get a response.  Please allow 48 business hours for a response.  Please remember that this is for non-urgent requests.  _______________________________________________________  Decrease your Budesonide to '6mg'$  (2 tab) for the month of August Decrease to '3mg'$  (1 tab) for the month of September.  October 1 - stop Budesonide.  Please follow up 3 months

## 2022-07-14 ENCOUNTER — Other Ambulatory Visit: Payer: Self-pay | Admitting: Internal Medicine

## 2022-07-14 ENCOUNTER — Other Ambulatory Visit: Payer: Self-pay | Admitting: Cardiology

## 2022-07-14 ENCOUNTER — Other Ambulatory Visit: Payer: Self-pay

## 2022-07-14 ENCOUNTER — Telehealth: Payer: Self-pay | Admitting: Internal Medicine

## 2022-07-14 MED ORDER — BUDESONIDE 3 MG PO CPEP: ORAL_CAPSULE | ORAL | 1 refills | Status: DC

## 2022-07-14 NOTE — Telephone Encounter (Signed)
Spoke with pt and she is aware of recommendations. New script sent to pharmacy. Pt scheduled to see Dr. Henrene Pastor 08/20/22 at 2:20pm. She is aware of appt.

## 2022-07-14 NOTE — Telephone Encounter (Signed)
Chart reviewed and most recently seen by Dr. Henrene Pastor on 06/18/2022 with good response to budesonide 9 mg/daily.  Started wean at 6 mg/day x1 month then 3 mg/day x1 month.  Given breakthrough symptoms after completion of therapy, with subsequent improvement after going back on budesonide, plan for the following:  - Please place Rx for budesonide 3 mg tablets.  Take 2 tablets (6 mg) for the next 8 weeks, then slowly wean to 3 mg/day for another 8 weeks.  RF 1. If breakthrough symptoms after stopping, may need an extended course of low-dose budesonide - Follow-up appointment with Dr. Henrene Pastor in the interim to evaluate clinical response

## 2022-07-14 NOTE — Telephone Encounter (Signed)
PT ha been prescribed Budesonide and every since she stopped it her BM has been extremely watery. She says that it is something she needs every day. Please advise

## 2022-07-14 NOTE — Telephone Encounter (Signed)
Cynthia Gilmore pt with hx of microscopic colitis. Pt states Dr. [name] had told her to wean off of the budesonide. She took her last pill 8/13 and had been off of it for 5 days. States on the 6th day she had bad diarrhea back like before she took the budesonide. Sat, Sun, and this morning she took 2 budesonide pills ('6mg'$ ). She reports the diarrhea is better and so far today she has not had a BM. States she is almost out of the medication and wants to know what she needs to do. AS DOD please advise.

## 2022-07-29 ENCOUNTER — Other Ambulatory Visit: Payer: Self-pay | Admitting: Cardiology

## 2022-08-13 ENCOUNTER — Other Ambulatory Visit: Payer: Self-pay | Admitting: Cardiology

## 2022-08-20 ENCOUNTER — Ambulatory Visit: Payer: Medicare Other | Admitting: Internal Medicine

## 2023-01-13 DIAGNOSIS — E119 Type 2 diabetes mellitus without complications: Secondary | ICD-10-CM | POA: Diagnosis not present

## 2023-01-13 DIAGNOSIS — K219 Gastro-esophageal reflux disease without esophagitis: Secondary | ICD-10-CM | POA: Diagnosis not present

## 2023-01-13 DIAGNOSIS — E7800 Pure hypercholesterolemia, unspecified: Secondary | ICD-10-CM | POA: Diagnosis not present

## 2023-01-13 DIAGNOSIS — K589 Irritable bowel syndrome without diarrhea: Secondary | ICD-10-CM | POA: Diagnosis not present

## 2023-01-13 DIAGNOSIS — I10 Essential (primary) hypertension: Secondary | ICD-10-CM | POA: Diagnosis not present

## 2023-01-13 DIAGNOSIS — R5383 Other fatigue: Secondary | ICD-10-CM | POA: Diagnosis not present

## 2023-01-13 NOTE — Progress Notes (Signed)
Cardiology Office Note   Date:  01/19/2023   ID:  Cynthia Gilmore, DOB 1934-10-14, MRN [id number]  PCP:  Aurea Graff.Marlou Sa, MD  Cardiologist:   Broden Holt Martinique, MD   Chief Complaint  Patient presents with   Hypertension   Palpitations      History of Present Illness: Cynthia Gilmore is a 89 y.o. female who presents for evaluation of SVT. Formerly seen by Dr Daneen Schick. She has a history of subvalvular Aortic stenosis with gradient. She also has history of anxiety disorder, DM type 2, and history of intermittent chest pain. She also has a history of nonsustained SVT and wide complex arrhythmia treated with Toprol XL.  She has multiple drug intolerances. On Lexapro for depression.   On follow up today she is doing very well. No chest pain, palpitations, dizziness or dyspnea. Sugar is doing well. BP well controlled.    Past Medical History:  Diagnosis Date   Allergy    Anxiety    Anxiety and depression    ? bipolar   Cataract    bil cataracts removed   Depression    Diabetes mellitus, type 2 (HCC)    GERD (gastroesophageal reflux disease)    Heart murmur    Hemorrhoids 2012    Hiatal hernia    Hypertension    IBS (irritable bowel syndrome)    Lymphocytic colitis 2012   Mumerous medication allergies and sensitivities 07/20/2015   Osteoarthritis (arthritis due to wear and tear of joints)    Segmental colitis ([place])    iscemic colitis 2009   Stroke [facility])    states 2 "'mini strokes" 1980   TIA (transient ischemic attack) 2000   x 3     Past Surgical History:  Procedure Laterality Date   ANKLE SURGERY  1999   right ankle fracture surgery   [place], BILATERAL     CHOLECYSTECTOMY  2004   COLONOSCOPY W/ BIOPSIES AND POLYPECTOMY   07/29/2011   internal hemorrhoids, lymphocytic colitis   ESOPHAGEAL MANOMETRY     Normal   ESOPHAGOGASTRODUODENOSCOPY  07/29/2011   gastritis,hiatal hernia, tortuous esophagus   EUS  06/03/2012    Procedure: UPPER ENDOSCOPIC ULTRASOUND (EUS) LINEAR;  Surgeon: Milus Banister, MD;  Location: [facility] ENDOSCOPY;  Service: Endoscopy;  Laterality: N/A;   HERNIA REPAIR  1995   PARTIAL HYSTERECTOMY  1992   partial     Current Outpatient Medications  Medication Sig Dispense Refill   ALPRAZolam (XANAX) 0.25 MG tablet TAKE 1/2 TO 1 TABLET BY MOUTH ONCE DAILY AS NEEDED 30 tablet 0   anti-nausea (EMETROL) solution Take 5 mLs by mouth as needed for nausea or vomiting.     Ascorbic Acid (VITAMIN C) 500 MG CAPS Take by mouth.     budesonide (ENTOCORT EC) 3 MG 24 hr capsule Take 2 caps by mouth daily for 8 weeks, then take 1 cap daily for 8 weeks. 120 capsule 1   DULoxetine (CYMBALTA) 60 MG capsule Take 60 mg by mouth daily.     famotidine (PEPCID) 40 MG tablet TAKE 1 TABLET(40 MG) BY MOUTH TWICE DAILY 180 tablet 3   glucose blood (ACCU-CHEK SMARTVIEW) test strip 1 each by Other route daily. 100 each 12   hyoscyamine (ANASPAZ) 0.125 MG TBDP disintergrating tablet Place 1 tablet (0.125 mg total) under the tongue every 4 (four) hours as needed for cramping. 90 tablet 5   ibuprofen (ADVIL,MOTRIN) 200 MG tablet Take  200 mg by mouth every 6 (six) hours as needed.     losartan (COZAAR) 100 MG tablet Take 100 mg by mouth daily.     metFORMIN (GLUCOPHAGE-XR) 500 MG 24 hr tablet TAKE 1 TABLET(500 MG) BY MOUTH TWICE DAILY WITH A MEAL 180 tablet 0   metoprolol succinate (TOPROL-XL) 25 MG 24 hr tablet TAKE 1 TABLET(25 MG) BY MOUTH TWICE DAILY IN ADDITION TO YOUR 50 MG TABLET TO TOTAL 75 MG DAILY 60 tablet 7   metoprolol succinate (TOPROL-XL) 50 MG 24 hr tablet TAKE 1 TABLET BY MOUTH TWICE DAILY IN ADDITION TO THE 25 MG TABLET TO TOTAL 75 MG 2 TIMES DAILY. TAKE WITH OR IMMEDIATELY FOLLOWING A MEAL 180 tablet 2   Misc Natural Products (ESTROVEN ENERGY PO) Take by mouth.     spironolactone (ALDACTONE) 25 MG tablet TAKE 1 AND 1/2 TABLETS(37.5 MG) BY MOUTH DAILY 135 tablet 2   No current facility-administered medications  for this visit.    Allergies:   Alprazolam, Amlodipine besylate, Aripiprazole, Augmentin [amoxicillin-pot clavulanate], Azithromycin, Bupropion hcl, Cefaclor, Celecoxib, Cephalexin, Ciprofloxacin, Clonidine hydrochloride, Desvenlafaxine, Diphenoxylate-atropine, Divalproex sodium, Doxycycline, Entex, Est estrogens-methyltest, Fluoxetine hcl, Furosemide, Hydrochlorothiazide, Hydrocodone-acetaminophen, Lamotrigine, Lansoprazole, Lisinopril, Lorazepam, Meloxicam, Methylphenidate hcl, Metronidazole, Mirtazapine, Nabumetone, Nitrofuran derivatives, Olmesartan medoxomil, Omeprazole, Other, Oxycodone-acetaminophen, Pantoprazole sodium, Paroxetine, Quetiapine, Rabeprazole sodium, Rofecoxib, Sertraline hcl, Sumatriptan, Telithromycin, Topamax, Tramadol-acetaminophen, Trintellix [vortioxetine], Venlafaxine, Verapamil, and Vilazodone hcl [vilazodone hcl]    Social History:  The patient  reports that she has quit smoking. Her smoking use included cigarettes. She has never used smokeless tobacco. She reports that she does not drink alcohol and does not use drugs.   Family History:  The patient's family history includes Diabetes in her maternal aunt, maternal uncle, and mother; Heart attack in her father; Heart disease in her father and paternal aunt.    ROS:  Please see the history of present illness.   Otherwise, review of systems are positive for .   All other systems are reviewed and negative.    PHYSICAL EXAM: VS:  BP 126/64 (BP Location: Left Arm, Patient Position: Sitting, Cuff Size: Normal)   Pulse 63   Wt 140 lb 12.8 oz (63.9 kg)   SpO2 96%   BMI 23.43 kg/m  , BMI Body mass index is 23.43 kg/m. GEN: Well nourished, well developed, in no acute distress  HEENT: normal  Neck: no JVD, carotid bruits, or masses Cardiac: RRR; no  rubs, or gallops,no edema  Soft 1/6 systolic murmur RUSB Respiratory:  clear to auscultation bilaterally, normal work of breathing GI: soft, nontender, nondistended, +  BS MS: no deformity or atrophy  Skin: warm and dry, no rash Neuro:  Strength and sensation are intact Psych: euthymic mood, full affect   EKG:  EKG is ordered today. The ekg ordered today demonstrates Junctional rhythm rate 63. Low voltage. Nonspecific IVCD. I have personally reviewed and interpreted this study.    Recent Labs: No results found for requested labs within last 365 days.    Lipid Panel    Component Value Date/Time   CHOL 196 03/24/2017 1048   TRIG 209.0 (H) 03/24/2017 1048   HDL 49.40 03/24/2017 1048   CHOLHDL 4 03/24/2017 1048   VLDL 41.8 (H) 03/24/2017 1048   LDLCALC 98 12/19/2016 1457   LDLDIRECT 124.0 03/24/2017 1048      Wt Readings from Last 3 Encounters:  01/19/23 140 lb 12.8 oz (63.9 kg)  06/18/22 125 lb 12.8 oz (57.1 kg)  05/05/22 123 lb (55.8 kg)  Other studies Reviewed: Additional studies/ records that were reviewed today include:   Echo 11/22/13: Study Conclusions   - Left ventricle: The cavity size was normal. There was mild    focal basal hypertrophy of the septum. Systolic function    was vigorous. The estimated ejection fraction was in the    range of 65% to 70%. Wall motion was normal; there were no    regional wall motion abnormalities.  - Aortic valve: Mild sub aortic valve gradient.  - Mitral valve: There was systolic anterior motion. Mild    regurgitation.   Myoview 03/17/17: Study Highlights    Nuclear stress EF: 81%. Blood pressure demonstrated a normal response to exercise. There was no ST segment deviation noted during stress. Defect 1: There is a small defect of moderate severity present in the basal inferior location. This is a low risk study. The left ventricular ejection fraction is hyperdynamic (>65%).   Low risk stress nuclear study with mild inferobasal thinning; no significant ischemia (minimal reversibility felt unlikely to be significant); EF 81 with normal wall motion.    Event monitor 03/17/17: Study  Highlights    The underlying basic rhythm is normal sinus rhythm No sustained arrhythmias, either tachycardia or bradycardia were noted. Multiple events of "palpitation" and "flutter ", were reported but do not correlate with significant arrhythmia. On one occasion "palpitation" was associated with ventricular bigeminy   Event monitor 06/14/19: Study Highlights  NSR is basic rhythm Non sustained WCT lasting up to 12 seconds with rate 154-170 bpm Nonsustained SVT PAC's and PVC's      ASSESSMENT AND PLAN:  1. PSVT/ wide complex arrythmia. Well controlled on metoprolol.  Continue current therapy.   2. HTN essential- well controlled. Continue current therapy  3. Subvalvular aortic stenosis- mild obstruction and asymptomatic.     Current medicines are reviewed at length with the patient today.  The patient does not have concerns regarding medicines.  The following changes have been made:  no change  Labs/ tests ordered today include:  No orders of the defined types were placed in this encounter.    Disposition:   FU with me in one  year  Signed, Sahar Ryback Martinique, MD  01/19/2023 2:40 PM    [facility] [address] [place], [place], [address] Phone [phone], Fax [phone]

## 2023-01-19 ENCOUNTER — Encounter: Payer: Self-pay | Admitting: Cardiology

## 2023-01-19 ENCOUNTER — Ambulatory Visit: Payer: Medicare Other | Attending: Cardiology | Admitting: Cardiology

## 2023-01-19 VITALS — BP 126/64 | HR 63 | Wt 140.8 lb

## 2023-01-19 DIAGNOSIS — I471 Supraventricular tachycardia, unspecified: Secondary | ICD-10-CM | POA: Diagnosis not present

## 2023-01-19 DIAGNOSIS — Q244 Congenital subaortic stenosis: Secondary | ICD-10-CM | POA: Diagnosis not present

## 2023-01-19 DIAGNOSIS — I10 Essential (primary) hypertension: Secondary | ICD-10-CM

## 2023-01-19 NOTE — Patient Instructions (Signed)
Medication Instructions:  Your physician recommends that you continue on your current medications as directed. Please refer to the Current Medication list given to you today.  *If you need a refill on your cardiac medications before your next appointment, please call your pharmacy*   Follow-Up: At [facility], you and your health needs are our priority.  As part of our continuing mission to provide you with exceptional heart care, we have created designated Provider Care Teams.  These Care Teams include your primary Cardiologist (physician) and Advanced Practice Providers (APPs -  Physician Assistants and Nurse Practitioners) who all work together to provide you with the care you need, when you need it.  We recommend signing up for the patient portal called "MyChart".  Sign up information is provided on this After Visit Summary.  MyChart is used to connect with patients for Virtual Visits (Telemedicine).  Patients are able to view lab/test results, encounter notes, upcoming appointments, etc.  Non-urgent messages can be sent to your provider as well.   To learn more about what you can do with MyChart, go to [email or web address].    Your next appointment:   12 month(s)  Provider:   Dr. Peter Martinique

## 2023-02-13 ENCOUNTER — Other Ambulatory Visit: Payer: Self-pay | Admitting: Cardiology

## 2023-02-23 ENCOUNTER — Encounter (HOSPITAL_COMMUNITY): Payer: Self-pay

## 2023-02-23 ENCOUNTER — Other Ambulatory Visit: Payer: Self-pay

## 2023-02-23 ENCOUNTER — Emergency Department (HOSPITAL_COMMUNITY): Payer: Medicare Other

## 2023-02-23 ENCOUNTER — Emergency Department (HOSPITAL_COMMUNITY)
Admission: EM | Admit: 2023-02-23 | Discharge: 2023-02-23 | Disposition: A | Discharge status: Home / Self Care | Payer: Medicare Other | Attending: Emergency Medicine | Admitting: Emergency Medicine

## 2023-02-23 DIAGNOSIS — M25551 Pain in right hip: Secondary | ICD-10-CM | POA: Insufficient documentation

## 2023-02-23 DIAGNOSIS — E119 Type 2 diabetes mellitus without complications: Secondary | ICD-10-CM | POA: Diagnosis not present

## 2023-02-23 DIAGNOSIS — Z7984 Long term (current) use of oral hypoglycemic drugs: Secondary | ICD-10-CM | POA: Diagnosis not present

## 2023-02-23 DIAGNOSIS — R0781 Pleurodynia: Secondary | ICD-10-CM | POA: Diagnosis not present

## 2023-02-23 DIAGNOSIS — W01198A Fall on same level from slipping, tripping and stumbling with subsequent striking against other object, initial encounter: Secondary | ICD-10-CM | POA: Diagnosis not present

## 2023-02-23 DIAGNOSIS — S0081XA Abrasion of other part of head, initial encounter: Secondary | ICD-10-CM | POA: Diagnosis not present

## 2023-02-23 DIAGNOSIS — S0990XA Unspecified injury of head, initial encounter: Secondary | ICD-10-CM | POA: Diagnosis not present

## 2023-02-23 DIAGNOSIS — Y92810 Car as the place of occurrence of the external cause: Secondary | ICD-10-CM | POA: Diagnosis not present

## 2023-02-23 DIAGNOSIS — W19XXXA Unspecified fall, initial encounter: Secondary | ICD-10-CM

## 2023-02-23 NOTE — ED Provider Notes (Signed)
[facility] Provider Note   CSN: [id number] Arrival date & time: 02/23/23  1129     History  Chief Complaint  Patient presents with   Cynthia Gilmore is a 89 y.o. female.  Patient presents to the emergency department complaining of right-sided hip and rib pain.  Also complains of a laceration to the right side of her face.  Patient was getting out of her car while here at the hospital to visit family member when she tripped over some concrete.  She denies losing consciousness and denies blood thinner usage.  Past medical history is significant for type II DM, GERD, TIA, CVA  HPI     Home Medications Prior to Admission medications   Medication Sig Start Date End Date Taking? Authorizing Provider  ALPRAZolam Duanne Moron) 0.25 MG tablet TAKE 1/2 TO 1 TABLET BY MOUTH ONCE DAILY AS NEEDED 03/23/17   Carollee Herter, Alferd Apa, DO  anti-nausea (EMETROL) solution Take 5 mLs by mouth as needed for nausea or vomiting.    [provider]  Ascorbic Acid (VITAMIN C) 500 MG CAPS Take by mouth.    [provider]  budesonide (ENTOCORT EC) 3 MG 24 hr capsule Take 2 caps by mouth daily for 8 weeks, then take 1 cap daily for 8 weeks. 07/14/22   Irene Shipper, MD  DULoxetine (CYMBALTA) 60 MG capsule Take 60 mg by mouth daily. 01/18/23   [provider]  famotidine (PEPCID) 40 MG tablet TAKE 1 TABLET(40 MG) BY MOUTH TWICE DAILY 07/14/22   Irene Shipper, MD  glucose blood (ACCU-CHEK SMARTVIEW) test strip 1 each by Other route daily. 10/16/14   Shawna Orleans, Doe-Hyun R, DO  hyoscyamine (ANASPAZ) 0.125 MG TBDP disintergrating tablet Place 1 tablet (0.125 mg total) under the tongue every 4 (four) hours as needed for cramping. 05/05/22   Irene Shipper, MD  ibuprofen (ADVIL,MOTRIN) 200 MG tablet Take 200 mg by mouth every 6 (six) hours as needed.    [provider]  losartan (COZAAR) 100 MG tablet Take 100 mg by mouth daily.    [provider]  metFORMIN (GLUCOPHAGE-XR) 500 MG 24 hr tablet TAKE 1 TABLET(500 MG) BY MOUTH TWICE DAILY WITH A MEAL 06/28/18   Carollee Herter, Yvonne R, DO  metoprolol succinate (TOPROL-XL) 25 MG 24 hr tablet TAKE 1 TABLET(25 MG) BY MOUTH TWICE DAILY IN ADDITION TO YOUR 50 MG TABLET TO TOTAL 75 MG DAILY 02/13/23   Martinique, Peter M, MD  metoprolol succinate (TOPROL-XL) 50 MG 24 hr tablet TAKE 1 TABLET BY MOUTH TWICE DAILY IN ADDITION TO THE 25 MG TABLET TO TOTAL 75 MG 2 TIMES DAILY. TAKE WITH OR IMMEDIATELY FOLLOWING A MEAL 07/29/22   Martinique, Peter M, MD  Misc Natural Products (ESTROVEN ENERGY PO) Take by mouth.    [provider]  spironolactone (ALDACTONE) 25 MG tablet TAKE 1 AND 1/2 TABLETS(37.5 MG) BY MOUTH DAILY 08/13/22   Martinique, Peter M, MD      Allergies    Alprazolam, Amlodipine besylate, Aripiprazole, Augmentin [amoxicillin-pot clavulanate], Azithromycin, Bupropion hcl, Cefaclor, Celecoxib, Cephalexin, Ciprofloxacin, Clonidine hydrochloride, Desvenlafaxine, Diphenoxylate-atropine, Divalproex sodium, Doxycycline, Entex, Est estrogens-methyltest, Fluoxetine hcl, Furosemide, Hydrochlorothiazide, Hydrocodone-acetaminophen, Lamotrigine, Lansoprazole, Lisinopril, Lorazepam, Meloxicam, Methylphenidate hcl, Metronidazole, Mirtazapine, Nabumetone, Nitrofuran derivatives, Olmesartan medoxomil, Omeprazole, Other, Oxycodone-acetaminophen, Pantoprazole sodium, Paroxetine, Quetiapine, Rabeprazole sodium, Rofecoxib, Sertraline hcl, Sumatriptan, Telithromycin, Topamax, Tramadol-acetaminophen, Trintellix [vortioxetine], Venlafaxine, Verapamil, and Vilazodone hcl [vilazodone hcl]    Review of  Systems   Review of Systems  Physical Exam Updated Vital Signs BP 102/68   Pulse 68   Temp (!) 97.4 F (36.3 C) (Temporal)   Resp 16   Ht 5\' 5"  (1.651 m)   Wt 63.5 kg   SpO2 100%   BMI 23.30 kg/m  Physical Exam Vitals and nursing note reviewed.  Constitutional:      General: She is not in acute distress.     Appearance: She is well-developed.  HENT:     Head: Normocephalic and atraumatic.  Eyes:     Conjunctiva/sclera: Conjunctivae normal.  Cardiovascular:     Rate and Rhythm: Normal rate and regular rhythm.     Heart sounds: No murmur heard. Pulmonary:     Effort: Pulmonary effort is normal. No respiratory distress.     Breath sounds: Normal breath sounds.  Abdominal:     Palpations: Abdomen is soft.     Tenderness: There is no abdominal tenderness.  Musculoskeletal:        General: Tenderness present. No swelling.     Cervical back: Neck supple.     Comments: Mild tenderness to palpation of lateral right hip, right sided rib cage  Skin:    General: Skin is warm and dry.     Capillary Refill: Capillary refill takes less than 2 seconds.     Comments: Small abrasion noted to the right side of the patient's face  Neurological:     Mental Status: She is alert.  Psychiatric:        Mood and Affect: Mood normal.     ED Results / Procedures / Treatments   Labs (all labs ordered are listed, but only abnormal results are displayed) Labs Reviewed - No data to display  EKG None  Radiology CT Head Wo Contrast  Result Date: 02/23/2023 CLINICAL DATA:  Facial trauma, blunt. EXAM: CT HEAD WITHOUT CONTRAST TECHNIQUE: Contiguous axial images were obtained from the base of the skull through the vertex without intravenous contrast. RADIATION DOSE REDUCTION: This exam was performed according to the departmental dose-optimization program which includes automated exposure control, adjustment of the mA and/or kV according to patient size and/or use of iterative reconstruction technique. COMPARISON:  Head CT 06/20/2020. FINDINGS: Brain: No acute hemorrhage, mass effect or midline shift. Gray-white differentiation is preserved. No hydrocephalus. No extra-axial collection. Basilar cisterns are patent. Vascular: No hyperdense vessel or unexpected calcification. Skull: No calvarial fracture or suspicious bone  lesion. Skull base is unremarkable. Sinuses/Orbits: Unchanged chronic right sphenoid sinusitis. Orbits are unremarkable. Other: None. IMPRESSION: 1. No evidence of acute intracranial injury. 2. Unchanged chronic right sphenoid sinusitis. Electronically Signed   By: Emmit Alexanders M.D.   On: 02/23/2023 12:57   DG Ribs Unilateral W/Chest Right  Result Date: 02/23/2023 CLINICAL DATA:  Fall, right rib pain EXAM: RIGHT RIBS AND CHEST - 3+ VIEW COMPARISON:  07/28/2017 FINDINGS: No fracture or other bone lesions are seen involving the ribs. There is no evidence of pneumothorax or pleural effusion. Both lungs are clear. Heart size and mediastinal contours are within normal limits. IMPRESSION: Negative. Electronically Signed   By: Davina Poke D.O.   On: 02/23/2023 12:29   DG Hip Unilat W or Wo Pelvis 2-3 Views Right  Result Date: 02/23/2023 CLINICAL DATA:  Pain after fall EXAM: DG HIP (WITH OR WITHOUT PELVIS) 3V RIGHT COMPARISON:  None Available. FINDINGS: Osteopenia. No fracture or dislocation. Preserved joint spaces. Chondrocalcinosis. Mild degenerative changes of the sacroiliac joints. There are some rounded densities  in the pelvis which are indeterminate although possibly vascular. Degenerative changes are seen of the lumbar spine at the edge of the imaging field. IMPRESSION: Osteopenia. No acute osseous abnormality. Mild degenerative change. Electronically Signed   By: Karen Kays M.D.   On: 02/23/2023 12:29    Procedures Procedures    Medications Ordered in ED Medications - No data to display  ED Course/ Medical Decision Making/ A&P Clinical Course as of 02/23/23 1747  Mon Feb 23, 2023  1537 DG Ribs Unilateral W/Chest Right [LM]    Clinical Course User Index [LM] Pamala Duffel                             Medical Decision Making Amount and/or Complexity of Data Reviewed Radiology:  Decision-making details documented in ED Course.   Patient presents with a chief complaint of  right-sided rib pain, right-sided hip pain, and abrasion to right side of face.  Differential diagnosis includes but is not limited to intracranial abnormality, fracture, dislocation, soft tissue injury, others  Patient has, but he is including previous CVA, type II DM  I ordered and interpreted imaging including plain films of the hip, ribs, CT scan of the head.  No acute abnormalities were noted.  I agree with radiologist findings  The patient has no acute abnormalities on any imaging.  She does have an abrasion to the right side of her face and bruising from the falls.  The patient feels ready to discharge home at this time.  Her lungs are clear to auscultation and she has no difficulty breathing.  I see no indication for further emergent workup or imaging.  Patient may follow-up with me with her primary care provider.  Patient instructed on over-the-counter pain medication options to use as needed        Final Clinical Impression(s) / ED Diagnoses Final diagnoses:  Fall, initial encounter  Abrasion of face, initial encounter    Rx / DC Orders ED Discharge Orders     None         Pamala Duffel 02/23/23 1747    Pricilla Loveless, MD 02/27/23 [phone]

## 2023-02-23 NOTE — ED Triage Notes (Signed)
Reports tripped when she got out of her car.  Patient has abrasion to right forehead, right hand and lip. Patient complains of right rib pain and right hip pain but ambulatory in triage.  No LOC no thinners.  Tetanus UTD

## 2023-02-23 NOTE — Discharge Instructions (Addendum)
You were evaluated today after a fall.  Your workup was reassuring for no signs of acute fracture or dislocation.  Please keep the abrasion on your face clean.  Please take over-the-counter medicines as needed for pain control.  Follow-up with your primary care provider as needed.

## 2023-04-23 DIAGNOSIS — Z7984 Long term (current) use of oral hypoglycemic drugs: Secondary | ICD-10-CM | POA: Diagnosis not present

## 2023-04-23 DIAGNOSIS — I10 Essential (primary) hypertension: Secondary | ICD-10-CM | POA: Diagnosis not present

## 2023-04-23 DIAGNOSIS — Z0000 Encounter for general adult medical examination without abnormal findings: Secondary | ICD-10-CM | POA: Diagnosis not present

## 2023-04-23 DIAGNOSIS — E538 Deficiency of other specified B group vitamins: Secondary | ICD-10-CM | POA: Diagnosis not present

## 2023-04-23 DIAGNOSIS — R5383 Other fatigue: Secondary | ICD-10-CM | POA: Diagnosis not present

## 2023-04-23 DIAGNOSIS — J309 Allergic rhinitis, unspecified: Secondary | ICD-10-CM | POA: Diagnosis not present

## 2023-04-23 DIAGNOSIS — E1169 Type 2 diabetes mellitus with other specified complication: Secondary | ICD-10-CM | POA: Diagnosis not present

## 2023-04-23 DIAGNOSIS — K589 Irritable bowel syndrome without diarrhea: Secondary | ICD-10-CM | POA: Diagnosis not present

## 2023-04-23 DIAGNOSIS — I471 Supraventricular tachycardia, unspecified: Secondary | ICD-10-CM | POA: Diagnosis not present

## 2023-04-23 DIAGNOSIS — E119 Type 2 diabetes mellitus without complications: Secondary | ICD-10-CM | POA: Diagnosis not present

## 2023-04-23 DIAGNOSIS — K219 Gastro-esophageal reflux disease without esophagitis: Secondary | ICD-10-CM | POA: Diagnosis not present

## 2023-04-23 DIAGNOSIS — E7800 Pure hypercholesterolemia, unspecified: Secondary | ICD-10-CM | POA: Diagnosis not present

## 2023-04-27 ENCOUNTER — Other Ambulatory Visit: Payer: Self-pay

## 2023-04-27 MED ORDER — METOPROLOL SUCCINATE ER 50 MG PO TB24: ORAL_TABLET | ORAL | 3 refills | Status: DC

## 2023-05-13 DIAGNOSIS — M2041 Other hammer toe(s) (acquired), right foot: Secondary | ICD-10-CM | POA: Diagnosis not present

## 2023-05-13 DIAGNOSIS — M21962 Unspecified acquired deformity of left lower leg: Secondary | ICD-10-CM | POA: Diagnosis not present

## 2023-05-13 DIAGNOSIS — E139 Other specified diabetes mellitus without complications: Secondary | ICD-10-CM | POA: Diagnosis not present

## 2023-05-13 DIAGNOSIS — M2011 Hallux valgus (acquired), right foot: Secondary | ICD-10-CM | POA: Diagnosis not present

## 2023-05-13 DIAGNOSIS — M21961 Unspecified acquired deformity of right lower leg: Secondary | ICD-10-CM | POA: Diagnosis not present

## 2023-05-13 DIAGNOSIS — M792 Neuralgia and neuritis, unspecified: Secondary | ICD-10-CM | POA: Diagnosis not present

## 2023-05-13 DIAGNOSIS — I739 Peripheral vascular disease, unspecified: Secondary | ICD-10-CM | POA: Diagnosis not present

## 2023-05-13 DIAGNOSIS — M2042 Other hammer toe(s) (acquired), left foot: Secondary | ICD-10-CM | POA: Diagnosis not present

## 2023-05-14 ENCOUNTER — Other Ambulatory Visit: Payer: Self-pay | Admitting: Cardiology

## 2023-06-23 ENCOUNTER — Other Ambulatory Visit: Payer: Self-pay | Admitting: Cardiology

## 2023-06-24 ENCOUNTER — Telehealth: Payer: Self-pay | Admitting: Cardiology

## 2023-06-24 NOTE — Telephone Encounter (Signed)
Pt's daughter states she needs a callback regarding a few of the medications the pt is on. Please advise.

## 2023-06-24 NOTE — Telephone Encounter (Signed)
Fyi,  Spoke with pharmacy and they stated insurance will not cover metoprolol 50 mg because its too early to refill.   Spoke with Vicky, they have been giving patient 25 mg BID and 50 mg BID. So she will have the same problem when its time to refill 25 mg as well.  Cynthia Gilmore is aware patient is only supposed to take on 25 mg tab daily and one 50 mg tab daily. She verbalized understanding.

## 2023-06-24 NOTE — Telephone Encounter (Signed)
Daughter called and stated that pharmacy informed her insurance will not cover the 50 mg  metoprolol because the instructions on the 25 mg metoprolol is states 25 BID in addition to the 50 mg which means it more than 75 mg daily. Can you please clarify.

## 2023-06-30 DIAGNOSIS — R350 Frequency of micturition: Secondary | ICD-10-CM | POA: Diagnosis not present

## 2023-06-30 DIAGNOSIS — Z6822 Body mass index (BMI) 22.0-22.9, adult: Secondary | ICD-10-CM | POA: Diagnosis not present

## 2023-06-30 DIAGNOSIS — Z1231 Encounter for screening mammogram for malignant neoplasm of breast: Secondary | ICD-10-CM | POA: Diagnosis not present

## 2023-07-07 ENCOUNTER — Ambulatory Visit: Admission: RE | Admit: 2023-07-07 | Payer: Medicare Other | Source: Ambulatory Visit

## 2023-07-07 ENCOUNTER — Other Ambulatory Visit: Payer: Self-pay | Admitting: Family Medicine

## 2023-07-07 DIAGNOSIS — M25551 Pain in right hip: Secondary | ICD-10-CM | POA: Diagnosis not present

## 2023-07-07 DIAGNOSIS — M545 Low back pain, unspecified: Secondary | ICD-10-CM

## 2023-07-07 DIAGNOSIS — M533 Sacrococcygeal disorders, not elsewhere classified: Secondary | ICD-10-CM

## 2023-07-07 DIAGNOSIS — M160 Bilateral primary osteoarthritis of hip: Secondary | ICD-10-CM | POA: Diagnosis not present

## 2023-07-07 DIAGNOSIS — S0990XA Unspecified injury of head, initial encounter: Secondary | ICD-10-CM | POA: Diagnosis not present

## 2023-07-09 ENCOUNTER — Other Ambulatory Visit: Payer: Self-pay | Admitting: Internal Medicine

## 2023-07-09 DIAGNOSIS — S32030A Wedge compression fracture of third lumbar vertebra, initial encounter for closed fracture: Secondary | ICD-10-CM

## 2023-07-09 DIAGNOSIS — M25551 Pain in right hip: Secondary | ICD-10-CM | POA: Diagnosis not present

## 2023-07-09 DIAGNOSIS — M533 Sacrococcygeal disorders, not elsewhere classified: Secondary | ICD-10-CM | POA: Diagnosis not present

## 2023-07-19 DIAGNOSIS — R051 Acute cough: Secondary | ICD-10-CM | POA: Diagnosis not present

## 2023-07-19 DIAGNOSIS — R52 Pain, unspecified: Secondary | ICD-10-CM | POA: Diagnosis not present

## 2023-07-19 DIAGNOSIS — J029 Acute pharyngitis, unspecified: Secondary | ICD-10-CM | POA: Diagnosis not present

## 2023-07-19 DIAGNOSIS — U071 COVID-19: Secondary | ICD-10-CM | POA: Diagnosis not present

## 2023-08-10 ENCOUNTER — Other Ambulatory Visit: Payer: Self-pay | Admitting: Cardiology

## 2023-08-10 ENCOUNTER — Other Ambulatory Visit: Payer: Self-pay | Admitting: Internal Medicine

## 2023-08-24 DIAGNOSIS — Z23 Encounter for immunization: Secondary | ICD-10-CM | POA: Diagnosis not present

## 2023-10-05 ENCOUNTER — Other Ambulatory Visit: Payer: Self-pay | Admitting: Internal Medicine

## 2023-10-06 ENCOUNTER — Telehealth: Payer: Self-pay | Admitting: Internal Medicine

## 2023-10-06 NOTE — Telephone Encounter (Signed)
Inbound call from patient requesting refill on budesonide. Please advise.

## 2023-10-07 NOTE — Telephone Encounter (Signed)
Patient requested a refill of Budesonide.  Upon looking at her office note for July 2023 she was instructed to taper the Budesonide until she was no longer taking it and then follow up in the office in 3 months.  She did not come back to the office and now is having intermittent diarrhea and feels she needs to be taking the Budesonide to control it.  I can schedule her for an office visit but is it ok to give her Budesonide and if so, at what dose?  Please advise.

## 2023-10-07 NOTE — Telephone Encounter (Signed)
Start her on budesonide 9 mg daily.  Give her several months of refills. Have her follow-up with one of the advanced practitioners, in the office, in about a month or so. Thanks, Dr. Marina Goodell

## 2023-10-08 MED ORDER — BUDESONIDE 3 MG PO CPEP: 9.0000 mg | ORAL_CAPSULE | Freq: Every day | ORAL | 1 refills | Status: DC

## 2023-10-08 NOTE — Telephone Encounter (Signed)
Refilled Budesonide and scheduled follow up appointment with patient.

## 2023-10-08 NOTE — Addendum Note (Signed)
Addended by: Jeanine Luz on: 10/08/2023 02:59 PM   Modules accepted: Orders

## 2023-10-09 ENCOUNTER — Telehealth: Payer: Self-pay

## 2023-10-09 MED ORDER — BUDESONIDE 3 MG PO CPEP: 9.0000 mg | ORAL_CAPSULE | Freq: Every day | ORAL | 1 refills | Status: DC

## 2023-10-09 NOTE — Telephone Encounter (Signed)
Resent Budesonide with correct dosing instructions

## 2023-10-19 DIAGNOSIS — B351 Tinea unguium: Secondary | ICD-10-CM | POA: Diagnosis not present

## 2023-10-19 DIAGNOSIS — E538 Deficiency of other specified B group vitamins: Secondary | ICD-10-CM | POA: Diagnosis not present

## 2023-10-19 DIAGNOSIS — K219 Gastro-esophageal reflux disease without esophagitis: Secondary | ICD-10-CM | POA: Diagnosis not present

## 2023-10-19 DIAGNOSIS — E1169 Type 2 diabetes mellitus with other specified complication: Secondary | ICD-10-CM | POA: Diagnosis not present

## 2023-10-19 DIAGNOSIS — I10 Essential (primary) hypertension: Secondary | ICD-10-CM | POA: Diagnosis not present

## 2023-10-19 DIAGNOSIS — K589 Irritable bowel syndrome without diarrhea: Secondary | ICD-10-CM | POA: Diagnosis not present

## 2023-10-19 DIAGNOSIS — E1142 Type 2 diabetes mellitus with diabetic polyneuropathy: Secondary | ICD-10-CM | POA: Diagnosis not present

## 2023-11-05 ENCOUNTER — Other Ambulatory Visit: Payer: Self-pay | Admitting: Internal Medicine

## 2023-12-04 ENCOUNTER — Inpatient Hospital Stay (HOSPITAL_COMMUNITY): Payer: Medicare Other

## 2023-12-04 ENCOUNTER — Emergency Department (HOSPITAL_COMMUNITY): Payer: Medicare Other

## 2023-12-04 ENCOUNTER — Other Ambulatory Visit: Payer: Self-pay

## 2023-12-04 ENCOUNTER — Inpatient Hospital Stay (HOSPITAL_COMMUNITY)
Admission: EM | Admit: 2023-12-04 | Discharge: 2023-12-09 | DRG: 481 | Disposition: A | Discharge status: Home Health | Payer: Medicare Other | Attending: Family Medicine | Admitting: Family Medicine

## 2023-12-04 DIAGNOSIS — K219 Gastro-esophageal reflux disease without esophagitis: Secondary | ICD-10-CM | POA: Diagnosis present

## 2023-12-04 DIAGNOSIS — I4719 Other supraventricular tachycardia: Secondary | ICD-10-CM | POA: Diagnosis present

## 2023-12-04 DIAGNOSIS — K5900 Constipation, unspecified: Secondary | ICD-10-CM | POA: Diagnosis not present

## 2023-12-04 DIAGNOSIS — Z7984 Long term (current) use of oral hypoglycemic drugs: Secondary | ICD-10-CM | POA: Diagnosis not present

## 2023-12-04 DIAGNOSIS — I480 Paroxysmal atrial fibrillation: Secondary | ICD-10-CM | POA: Diagnosis present

## 2023-12-04 DIAGNOSIS — E119 Type 2 diabetes mellitus without complications: Secondary | ICD-10-CM | POA: Diagnosis not present

## 2023-12-04 DIAGNOSIS — Y9301 Activity, walking, marching and hiking: Secondary | ICD-10-CM | POA: Diagnosis present

## 2023-12-04 DIAGNOSIS — F32A Depression, unspecified: Secondary | ICD-10-CM | POA: Diagnosis present

## 2023-12-04 DIAGNOSIS — Z7982 Long term (current) use of aspirin: Secondary | ICD-10-CM | POA: Diagnosis not present

## 2023-12-04 DIAGNOSIS — S72102A Unspecified trochanteric fracture of left femur, initial encounter for closed fracture: Secondary | ICD-10-CM

## 2023-12-04 DIAGNOSIS — E785 Hyperlipidemia, unspecified: Secondary | ICD-10-CM | POA: Diagnosis present

## 2023-12-04 DIAGNOSIS — Z886 Allergy status to analgesic agent status: Secondary | ICD-10-CM

## 2023-12-04 DIAGNOSIS — Z833 Family history of diabetes mellitus: Secondary | ICD-10-CM

## 2023-12-04 DIAGNOSIS — Z8673 Personal history of transient ischemic attack (TIA), and cerebral infarction without residual deficits: Secondary | ICD-10-CM

## 2023-12-04 DIAGNOSIS — Z881 Allergy status to other antibiotic agents status: Secondary | ICD-10-CM | POA: Diagnosis not present

## 2023-12-04 DIAGNOSIS — W010XXA Fall on same level from slipping, tripping and stumbling without subsequent striking against object, initial encounter: Secondary | ICD-10-CM | POA: Diagnosis present

## 2023-12-04 DIAGNOSIS — Z87891 Personal history of nicotine dependence: Secondary | ICD-10-CM | POA: Diagnosis not present

## 2023-12-04 DIAGNOSIS — E1149 Type 2 diabetes mellitus with other diabetic neurological complication: Secondary | ICD-10-CM | POA: Diagnosis present

## 2023-12-04 DIAGNOSIS — I482 Chronic atrial fibrillation, unspecified: Secondary | ICD-10-CM

## 2023-12-04 DIAGNOSIS — Y92009 Unspecified place in unspecified non-institutional (private) residence as the place of occurrence of the external cause: Secondary | ICD-10-CM

## 2023-12-04 DIAGNOSIS — F419 Anxiety disorder, unspecified: Secondary | ICD-10-CM | POA: Diagnosis present

## 2023-12-04 DIAGNOSIS — I10 Essential (primary) hypertension: Secondary | ICD-10-CM | POA: Diagnosis present

## 2023-12-04 DIAGNOSIS — W19XXXA Unspecified fall, initial encounter: Principal | ICD-10-CM

## 2023-12-04 DIAGNOSIS — D62 Acute posthemorrhagic anemia: Secondary | ICD-10-CM | POA: Diagnosis not present

## 2023-12-04 DIAGNOSIS — Z880 Allergy status to penicillin: Secondary | ICD-10-CM | POA: Diagnosis not present

## 2023-12-04 DIAGNOSIS — S72142A Displaced intertrochanteric fracture of left femur, initial encounter for closed fracture: Principal | ICD-10-CM | POA: Diagnosis present

## 2023-12-04 DIAGNOSIS — I471 Supraventricular tachycardia, unspecified: Secondary | ICD-10-CM | POA: Diagnosis not present

## 2023-12-04 DIAGNOSIS — I472 Ventricular tachycardia, unspecified: Secondary | ICD-10-CM | POA: Diagnosis present

## 2023-12-04 DIAGNOSIS — Z8249 Family history of ischemic heart disease and other diseases of the circulatory system: Secondary | ICD-10-CM

## 2023-12-04 DIAGNOSIS — S7290XA Unspecified fracture of unspecified femur, initial encounter for closed fracture: Secondary | ICD-10-CM | POA: Diagnosis present

## 2023-12-04 DIAGNOSIS — Z79899 Other long term (current) drug therapy: Secondary | ICD-10-CM | POA: Diagnosis not present

## 2023-12-04 DIAGNOSIS — Z888 Allergy status to other drugs, medicaments and biological substances status: Secondary | ICD-10-CM

## 2023-12-04 DIAGNOSIS — F418 Other specified anxiety disorders: Secondary | ICD-10-CM | POA: Diagnosis not present

## 2023-12-04 HISTORY — DX: Cardiac arrhythmia, unspecified: I49.9

## 2023-12-04 LAB — CBC WITH DIFFERENTIAL/PLATELET
Abs Immature Granulocytes: 0.04 10*3/uL (ref 0.00–0.07)
Basophils Absolute: 0.1 10*3/uL (ref 0.0–0.1)
Basophils Relative: 0 %
Eosinophils Absolute: 0.1 10*3/uL (ref 0.0–0.5)
Eosinophils Relative: 1 %
HCT: 34.9 % — ABNORMAL LOW (ref 36.0–46.0)
Hemoglobin: 11.3 g/dL — ABNORMAL LOW (ref 12.0–15.0)
Immature Granulocytes: 0 %
Lymphocytes Relative: 20 %
Lymphs Abs: 2.3 10*3/uL (ref 0.7–4.0)
MCH: 29.1 pg (ref 26.0–34.0)
MCHC: 32.4 g/dL (ref 30.0–36.0)
MCV: 89.9 fL (ref 80.0–100.0)
Monocytes Absolute: 0.9 10*3/uL (ref 0.1–1.0)
Monocytes Relative: 8 %
Neutro Abs: 7.8 10*3/uL — ABNORMAL HIGH (ref 1.7–7.7)
Neutrophils Relative %: 71 %
Platelets: 244 10*3/uL (ref 150–400)
RBC: 3.88 MIL/uL (ref 3.87–5.11)
RDW: 13.1 % (ref 11.5–15.5)
WBC: 11.2 10*3/uL — ABNORMAL HIGH (ref 4.0–10.5)
nRBC: 0 % (ref 0.0–0.2)

## 2023-12-04 LAB — BASIC METABOLIC PANEL
Anion gap: 12 (ref 5–15)
BUN: 16 mg/dL (ref 8–23)
CO2: 22 mmol/L (ref 22–32)
Calcium: 8.7 mg/dL — ABNORMAL LOW (ref 8.9–10.3)
Chloride: 106 mmol/L (ref 98–111)
Creatinine, Ser: 0.66 mg/dL (ref 0.44–1.00)
GFR, Estimated: >60 mL/min (ref >60)
Glucose, Bld: 134 mg/dL — ABNORMAL HIGH (ref 70–99)
Potassium: 3.9 mmol/L (ref 3.5–5.1)
Sodium: 140 mmol/L (ref 135–145)

## 2023-12-04 LAB — CBG MONITORING, ED: Glucose-Capillary: 124 mg/dL — ABNORMAL HIGH (ref 70–99)

## 2023-12-04 MED ORDER — POLYETHYLENE GLYCOL 3350 17 G PO PACK: 17.0000 g | PACK | Freq: Every day | ORAL | Status: DC | PRN

## 2023-12-04 MED ORDER — LABETALOL HCL 5 MG/ML IV SOLN: 20.0000 mg | INTRAVENOUS | Status: DC | PRN

## 2023-12-04 MED ORDER — SENNOSIDES-DOCUSATE SODIUM 8.6-50 MG PO TABS
2.0000 | ORAL_TABLET | Freq: Two times a day (BID) | ORAL | Status: DC
  Administered 2023-12-05 – 2023-12-09 (×7): 2 via ORAL
  Filled 2023-12-04 (×8): qty 2

## 2023-12-04 MED ORDER — INSULIN ASPART 100 UNIT/ML IJ SOLN
0.0000 [IU] | Freq: Three times a day (TID) | INTRAMUSCULAR | Status: DC
Start: 2023-12-05 — End: 2023-12-09
  Administered 2023-12-05: 3 [IU] via SUBCUTANEOUS
  Administered 2023-12-06: 2 [IU] via SUBCUTANEOUS
  Administered 2023-12-06: 3 [IU] via SUBCUTANEOUS
  Administered 2023-12-07: 2 [IU] via SUBCUTANEOUS
  Filled 2023-12-04: qty 0.15

## 2023-12-04 MED ORDER — MORPHINE SULFATE (PF) 4 MG/ML IV SOLN
4.0000 mg | INTRAVENOUS | Status: DC | PRN
  Administered 2023-12-04: 4 mg via INTRAVENOUS
  Filled 2023-12-04: qty 1

## 2023-12-04 MED ORDER — MORPHINE SULFATE ER 15 MG PO TBCR
15.0000 mg | EXTENDED_RELEASE_TABLET | Freq: Two times a day (BID) | ORAL | Status: AC
  Administered 2023-12-04: 15 mg via ORAL
  Filled 2023-12-04: qty 1

## 2023-12-04 MED ORDER — MORPHINE SULFATE (PF) 2 MG/ML IV SOLN
2.0000 mg | Freq: Once | INTRAVENOUS | Status: AC
  Administered 2023-12-04: 2 mg via INTRAVENOUS
  Filled 2023-12-04: qty 1

## 2023-12-04 MED ORDER — INSULIN ASPART 100 UNIT/ML IJ SOLN
0.0000 [IU] | Freq: Every day | INTRAMUSCULAR | Status: DC
  Filled 2023-12-04: qty 0.05

## 2023-12-04 MED ORDER — ENOXAPARIN SODIUM 40 MG/0.4ML IJ SOSY: 40.0000 mg | PREFILLED_SYRINGE | INTRAMUSCULAR | Status: DC

## 2023-12-04 MED ORDER — SODIUM CHLORIDE 0.9% FLUSH
3.0000 mL | Freq: Two times a day (BID) | INTRAVENOUS | Status: DC
  Administered 2023-12-04 – 2023-12-09 (×9): 3 mL via INTRAVENOUS

## 2023-12-04 MED ORDER — CEFAZOLIN SODIUM-DEXTROSE 2-4 GM/100ML-% IV SOLN
2.0000 g | INTRAVENOUS | Status: AC
  Administered 2023-12-05: 2 g via INTRAVENOUS

## 2023-12-04 MED ORDER — ENOXAPARIN SODIUM 40 MG/0.4ML IJ SOSY
40.0000 mg | PREFILLED_SYRINGE | Freq: Every day | INTRAMUSCULAR | Status: DC
Start: 2023-12-04 — End: 2023-12-05
  Administered 2023-12-04: 40 mg via SUBCUTANEOUS
  Filled 2023-12-04: qty 0.4

## 2023-12-04 MED ORDER — MORPHINE SULFATE (PF) 2 MG/ML IV SOLN
2.0000 mg | INTRAVENOUS | Status: DC | PRN
  Administered 2023-12-04 – 2023-12-05 (×2): 2 mg via INTRAVENOUS
  Filled 2023-12-04 (×3): qty 1

## 2023-12-04 MED ORDER — KCL-LACTATED RINGERS-D5W 20 MEQ/L IV SOLN
INTRAVENOUS | Status: AC
  Filled 2023-12-04: qty 1000

## 2023-12-04 MED ORDER — ONDANSETRON HCL 4 MG/2ML IJ SOLN
4.0000 mg | Freq: Once | INTRAMUSCULAR | Status: AC
  Administered 2023-12-04: 4 mg via INTRAVENOUS
  Filled 2023-12-04: qty 2

## 2023-12-04 MED ORDER — TRANEXAMIC ACID-NACL 1000-0.7 MG/100ML-% IV SOLN
1000.0000 mg | INTRAVENOUS | Status: AC
  Administered 2023-12-05: 1000 mg via INTRAVENOUS

## 2023-12-04 NOTE — ED Notes (Signed)
 Marland Kitchen

## 2023-12-04 NOTE — ED Triage Notes (Signed)
 Pt had mechanical fall onto left hip, c/o left hip to left knee pain, no deformity noted, pt appears uncomfortable. Denies hitting head

## 2023-12-04 NOTE — Assessment & Plan Note (Addendum)
 Glucose okay - SS corrections post-op - Hold metformin

## 2023-12-04 NOTE — H&P (Addendum)
 History and Physical    Patient: Cynthia Gilmore FMW:[id number] DOB: 1934-04-13 DOA: 12/04/2023 DOS: the patient was seen and examined on 12/04/2023 PCP: Vernon Velna SAUNDERS, MD  Patient coming from: Home  Chief Complaint:  Chief Complaint  Patient presents with   Fall   Leg Pain   HPI: Cynthia Gilmore is a 89 y.o. female with medical history significant of medical problems as listed below.  Patient reports that generally she keeps a cane and a walker at home available to herself.  But she often does not use it.  Patient describes having at least 4 falls while ambulating at home in the last 1 year.  She sometimes falls to the left sometimes to the right.  Patient denies any chest pain shortness of breath or palpitations with ambulation.  Patient was at home this afternoon at approximately 2:30 PM when she walked towards the door to answer the doorbell.  Unfortunately patient fell again on to her left side followed by severe left-sided hip pain with inability to get back up.  Patient's husband was at home and was able to get the patient to the hospital.  Patient denies any presyncope vertigo loss of consciousness or any focal weakness or leg going limp.  No numbness or tingling.  Patient denies any other site of pain such as new low back pain neck pain head pain etc.  Femoral fracture has been identified at [facility] long ER, see evaluation below.  Patient has had good pain response to morphine  4 mg.  IV  Medical eval is sougt  Dr. Georgina of ortho consulted. Would like transfer to [facility] for better OR times.  [facility]et offers no other complaitns at this time. Daughter at bedside Review of Systems: As mentioned in the history of present illness. All other systems reviewed and are negative. Past Medical History:  Diagnosis Date   Allergy    Anxiety    Anxiety and depression    ? bipolar   Cataract    bil cataracts removed   Depression    Diabetes mellitus, type 2 (HCC)    GERD  (gastroesophageal reflux disease)    Heart murmur    Hemorrhoids 2012    Hiatal hernia    Hypertension    IBS (irritable bowel syndrome)    Lymphocytic colitis 2012   Mumerous medication allergies and sensitivities 07/20/2015   Osteoarthritis (arthritis due to wear and tear of joints)    Segmental colitis (HCC)    iscemic colitis 2009   Stroke [facility])    states 2 'mini strokes 1980   TIA (transient ischemic attack) 2000   x 3    Past Surgical History:  Procedure Laterality Date   ANKLE SURGERY  1999   right ankle fracture surgery   BLADDER REPAIR  1995   CATARACT EXTRACTION, BILATERAL     CHOLECYSTECTOMY  2004   COLONOSCOPY W/ BIOPSIES AND POLYPECTOMY   07/29/2011   internal hemorrhoids, lymphocytic colitis   ESOPHAGEAL MANOMETRY     Normal   ESOPHAGOGASTRODUODENOSCOPY  07/29/2011   gastritis,hiatal hernia, tortuous esophagus   EUS  06/03/2012   Procedure: UPPER ENDOSCOPIC ULTRASOUND (EUS) LINEAR;  Surgeon: Toribio SHAUNNA Cedar, MD;  Location: [facility] ENDOSCOPY;  Service: Endoscopy;  Laterality: N/A;   HERNIA REPAIR  1995   PARTIAL HYSTERECTOMY  1992   partial   Social History:  reports that she has quit smoking. Her smoking use included cigarettes. She has never used smokeless tobacco. She reports that she  does not drink alcohol and does not use drugs.  Allergies  Allergen Reactions   Alprazolam      REACTION: sleepy   Amlodipine Besylate     REACTION: swelling   Aripiprazole    Augmentin  [Amoxicillin -Pot Clavulanate] Diarrhea   Azithromycin    Bupropion Hcl     REACTION: insomnia   Cefaclor     REACTION: mouth blisters   Celecoxib     REACTION: drained   Cephalexin    Ciprofloxacin    Clonidine Hydrochloride     REACTION: headache, dizziness   Desvenlafaxine    Diphenoxylate-Atropine    Divalproex Sodium     REACTION: nausea,tremors,fatigue   Doxycycline    Entex     REACTION: nausea, headache   Est Estrogens-Methyltest     REACTION: headache   Fluoxetine Hcl     Furosemide    Hydrochlorothiazide    Hydrocodone-Acetaminophen      REACTION: headache   Lamotrigine     REACTION: mood swing,chills, nausea   Lansoprazole    Lisinopril     REACTION: cough   Lorazepam    Meloxicam     REACTION: nausea, diarrhea   Methylphenidate Hcl    Metronidazole    Mirtazapine     REACTION: swelling   Nabumetone     REACTION: nausea, headache   Nitrofuran Derivatives Nausea Only   Olmesartan Medoxomil    Omeprazole  Diarrhea   Other     Alum and Mag Hydroxide Simeth(Solution) GI Cocktail. Diarrhea    Oxycodone -Acetaminophen     Pantoprazole  Sodium    Paroxetine     REACTION: tremor, insomnia, nausea   Quetiapine    Rabeprazole Sodium     REACTION: diarrhea   Rofecoxib    Sertraline Hcl     REACTION: exreme confusion   Sumatriptan     REACTION: cardiologist says no   Telithromycin     REACTION: drained   Topamax     Confusion and mood swings   Tramadol-Acetaminophen     Trintellix [Vortioxetine] Other (See Comments)    Made things worst   Venlafaxine     REACTION: hypertension, nausea, headache   Verapamil     Headaches and hot flashes    Vilazodone Hcl [Vilazodone Hcl]     Family History  Problem Relation Age of Onset   Diabetes Mother    Heart disease Father    Heart attack Father    Diabetes Maternal Aunt    Diabetes Maternal Uncle    Heart disease Paternal Aunt    Colon cancer Neg Hx    Esophageal cancer Neg Hx    Stomach cancer Neg Hx     Prior to Admission medications   Medication Sig Start Date End Date Taking? Authorizing Provider  ALPRAZolam  (XANAX ) 0.25 MG tablet TAKE 1/2 TO 1 TABLET BY MOUTH ONCE DAILY AS NEEDED 03/23/17   Antonio Meth, Yvonne R, DO  anti-nausea (EMETROL) solution Take 5 mLs by mouth as needed for nausea or vomiting.    [provider]  Ascorbic Acid (VITAMIN C) 500 MG CAPS Take by mouth.    [provider]  budesonide  (ENTOCORT EC ) 3 MG 24 hr capsule Take 3 capsules (9 mg total) by  mouth daily. 10/09/23   Abran Norleen SAILOR, MD  DULoxetine  (CYMBALTA ) 60 MG capsule Take 60 mg by mouth daily. 01/18/23   [provider]  famotidine  (PEPCID ) 40 MG tablet TAKE 1 TABLET BY MOUTH TWICE DAILY 11/05/23   Abran Norleen SAILOR, MD  glucose blood (  ACCU-CHEK SMARTVIEW) test strip 1 each by Other route daily. 10/16/14   Georgian, Doe-Hyun R, DO  hyoscyamine  (ANASPAZ ) 0.125 MG TBDP disintergrating tablet Place 1 tablet (0.125 mg total) under the tongue every 4 (four) hours as needed for cramping. 05/05/22   Abran Norleen SAILOR, MD  ibuprofen (ADVIL,MOTRIN) 200 MG tablet Take 200 mg by mouth every 6 (six) hours as needed.    [provider]  losartan  (COZAAR ) 100 MG tablet Take 100 mg by mouth daily.    [provider]  metFORMIN  (GLUCOPHAGE -XR) 500 MG 24 hr tablet TAKE 1 TABLET(500 MG) BY MOUTH TWICE DAILY WITH A MEAL 06/28/18   Antonio Meth, Yvonne R, DO  metoprolol  succinate (TOPROL -XL) 25 MG 24 hr tablet TAKE 1 TABLET(25 MG) BY MOUTH TWICE DAILY IN ADDITION TO YOUR 50 MG TABLET TO TOTAL 75 MG DAILY 08/11/23   Jordan, Peter M, MD  metoprolol  succinate (TOPROL -XL) 50 MG 24 hr tablet TAKE WITH OR IMMEDIATELY FOLLOWING A MEAL 06/23/23   Jordan, Peter M, MD  Misc Natural Products (ESTROVEN ENERGY PO) Take by mouth.    [provider]  spironolactone  (ALDACTONE ) 25 MG tablet TAKE 1 AND 1/2 TABLETS(37.5 MG) BY MOUTH DAILY 05/14/23   Jordan, Peter M, MD    Physical Exam: Vitals:   12/04/23 1805 12/04/23 2100  BP: 101/88 (!) 120/54  Pulse: 69 86  Resp: 18 13  Temp: 97.9 F (36.6 C)   TempSrc: Oral   SpO2: 100% 97%  Weight: 63.5 kg   Height: 5' 5 (1.651 m)    General: Patient is alert and awake, gives a coherent account of her symptoms Respiratory exam: Bilateral intravesicular Cardiovascular exam S1-S2 normal Abdomen soft nontender Extremities warm without edema left hip was not well-evaluated.  Otherwise no clinical fracture is identified neck movements are free no cranial  trauma is appreciated. Data Reviewed:  Labs on Admission:  Results for orders placed or performed during the hospital encounter of 12/04/23 (from the past 24 hours)  Basic metabolic panel     Status: Abnormal   Collection Time: 12/04/23  6:43 PM  Result Value Ref Range   Sodium 140 135 - 145 mmol/L   Potassium 3.9 3.5 - 5.1 mmol/L   Chloride 106 98 - 111 mmol/L   CO2 22 22 - 32 mmol/L   Glucose, Bld 134 (H) 70 - 99 mg/dL   BUN 16 8 - 23 mg/dL   Creatinine, Ser 9.33 0.44 - 1.00 mg/dL   Calcium  8.7 (L) 8.9 - 10.3 mg/dL   GFR, Estimated >39 >39 mL/min   Anion gap 12 5 - 15  CBC with Differential     Status: Abnormal   Collection Time: 12/04/23  6:43 PM  Result Value Ref Range   WBC 11.2 (H) 4.0 - 10.5 K/uL   RBC 3.88 3.87 - 5.11 MIL/uL   Hemoglobin 11.3 (L) 12.0 - 15.0 g/dL   HCT 65.0 (L) 63.9 - 53.9 %   MCV 89.9 80.0 - 100.0 fL   MCH 29.1 26.0 - 34.0 pg   MCHC 32.4 30.0 - 36.0 g/dL   RDW 86.8 88.4 - 84.4 %   Platelets 244 150 - 400 K/uL   nRBC 0.0 0.0 - 0.2 %   Neutrophils Relative % 71 %   Neutro Abs 7.8 (H) 1.7 - 7.7 K/uL   Lymphocytes Relative 20 %   Lymphs Abs 2.3 0.7 - 4.0 K/uL   Monocytes Relative 8 %   Monocytes Absolute 0.9 0.1 - 1.0  K/uL   Eosinophils Relative 1 %   Eosinophils Absolute 0.1 0.0 - 0.5 K/uL   Basophils Relative 0 %   Basophils Absolute 0.1 0.0 - 0.1 K/uL   Immature Granulocytes 0 %   Abs Immature Granulocytes 0.04 0.00 - 0.07 K/uL   Basic Metabolic Panel: Recent Labs  Lab 12/04/23 1843  NA 140  K 3.9  CL 106  CO2 22  GLUCOSE 134*  BUN 16  CREATININE 0.66  CALCIUM  8.7*   Liver Function Tests: No results for input(s): AST, ALT, ALKPHOS, BILITOT, PROT, ALBUMIN in the last 168 hours. No results for input(s): LIPASE, AMYLASE in the last 168 hours. No results for input(s): AMMONIA in the last 168 hours. CBC: Recent Labs  Lab 12/04/23 1843  WBC 11.2*  NEUTROABS 7.8*  HGB 11.3*  HCT 34.9*  MCV 89.9  PLT 244    Cardiac Enzymes: No results for input(s): CKTOTAL, CKMB, CKMBINDEX, TROPONINIHS in the last 168 hours.  BNP (last 3 results) No results for input(s): PROBNP in the last 8760 hours. CBG: No results for input(s): GLUCAP in the last 168 hours.  Radiological Exams on Admission:  CT Head Wo Contrast Result Date: 12/04/2023 CLINICAL DATA:  Fall EXAM: CT HEAD WITHOUT CONTRAST TECHNIQUE: Contiguous axial images were obtained from the base of the skull through the vertex without intravenous contrast. RADIATION DOSE REDUCTION: This exam was performed according to the departmental dose-optimization program which includes automated exposure control, adjustment of the mA and/or kV according to patient size and/or use of iterative reconstruction technique. COMPARISON:  02/23/2023 FINDINGS: Brain: No evidence of acute infarction, hemorrhage, mass, mass effect, or midline shift. No hydrocephalus or extra-axial fluid collection. Partial empty sella. Normal craniocervical junction. Vascular: No hyperdense vessel. Skull: Negative for fracture or focal lesion. Sinuses/Orbits: No acute finding. Other: The mastoid air cells are well aerated. IMPRESSION: No acute intracranial process. Electronically Signed   By: Donald Campion M.D.   On: 12/04/2023 19:41   DG Chest Portable 1 View Result Date: 12/04/2023 CLINICAL DATA:  Mechanical fall.  Hip and knee pain. EXAM: PORTABLE CHEST 1 VIEW COMPARISON:  Chest radiograph 02/23/2023 FINDINGS: The cardiomediastinal contours are normal. Aortic atherosclerosis. Pulmonary vasculature is normal. No consolidation, pleural effusion, or pneumothorax. No acute osseous abnormalities are seen. IMPRESSION: No active disease. Electronically Signed   By: Andrea Gasman M.D.   On: 12/04/2023 19:04   DG Hip Unilat W or Wo Pelvis 2-3 Views Left Result Date: 12/04/2023 CLINICAL DATA:  Fall, left hip pain EXAM: DG HIP (WITH OR WITHOUT PELVIS) 2-3V LEFT COMPARISON:  None Available.  FINDINGS: Mildly displaced intertrochanteric left hip fracture. Bilateral hip joint spaces are preserved. Visualized bony pelvis appears intact. IMPRESSION: Mildly displaced intertrochanteric left hip fracture. Electronically Signed   By: Pinkie Pebbles M.D.   On: 12/04/2023 19:02      Assessment and Plan: * Femoral fracture (HCC) Due to mechanical fall.  Patient is reporting allergies to celecoxib as well as acetaminophen .  Patient reports good pain relief with 4 mg of morphine .  Patient ordered for MS Contin  15 mg twice daily for 2 doses for sustained pain relief as well as 2 mg IV every 2 hours as needed for breakthrough pain.  DVT prophylaxis ordered with Lovenox , baseline INR and PTT requested to be drawn.  Patient will be transferred to [facility] as per orthopedic request.  Look forward to their evaluation in the morning.  Atrial fibrillation, chronic (HCC) Patint reports paroxysmal afib. But not on  chrnoc anticoagulation. EKG pending.  Type II diabetes mellitus with neurological manifestations (HCC) Insulin  sliding scale ordered   Home med rec pending pharmcy input.    Advance Care Planning:   Code Status: Full Code   Consults: orthopedics. Dr. Georgina  Family Communication: daughter at bedside. All questions answered.  Severity of Illness: The appropriate patient status for this patient is INPATIENT. Inpatient status is judged to be reasonable and necessary in order to provide the required intensity of service to ensure the patient's safety. The patient's presenting symptoms, physical exam findings, and initial radiographic and laboratory data in the context of their chronic comorbidities is felt to place them at high risk for further clinical deterioration. Furthermore, it is not anticipated that the patient will be medically stable for discharge from the hospital within 2 midnights of admission.   * I certify that at the point of admission it is my clinical judgment that the  patient will require inpatient hospital care spanning beyond 2 midnights from the point of admission due to high intensity of service, high risk for further deterioration and high frequency of surveillance required.*  Author: Jacqulyn Divine, MD 12/04/2023 9:58 PM  For on call review [email or web address].

## 2023-12-04 NOTE — Plan of Care (Signed)
 Orthopedic Plan of Care  Patient has left intertrochanteric femur fracture. Admit to medicine. NPO at midnight. Planning for surgery tomorrow (12/05/2023). Transfer to [facility] for surgery. NWB LLE.   London Sheer, MD Orthopedic Surgeon

## 2023-12-04 NOTE — ED Notes (Signed)
 124MG  CBG

## 2023-12-04 NOTE — Consult Note (Addendum)
 Orthopedic Surgery Consult Note  Assessment: Patient is a 89 y.o. female with left intertrochanteric femur fracture   Plan: -Planning for operative management today -Diet: NPO for procedure -DVT ppx: aspirin  81mg  BID post-operatively -Ancef  and TXA on call to OR -Weight bearing status: NWB LLE -PT evaluate and treat -Pain control -Dispo: pending completion of operative plans   Discussed recommendation for operative intervention in the form of left intertrochanteric femur fracture open reduction internal fixation with cephalomedullary rodding. Explained the risks of this procedure included, but were not limited to: nonunion, malunion, hardware failure, infection, bleeding, stiffness, screw cut out, need for additional procedures, deep vein thrombosis, pulmonary embolism, and death. The benefits of this procedure would be to promote fracture healing in appropriate alignment and to allow for early mobilization. The alternatives of this surgery would be to treat the fracture with traction or to do no intervention. The patient's questions were answered to her satisfaction. After this discussion, patient elected to proceed with surgery. Informed consent was obtained.   ___________________________________________________________________________   Reason for consult: left intertrochanteric femur fracture  History:  Patient is a 89 y.o. female who had a ground level fall at home. She noted immediate onset of left hip pain. She was unable to ambulate and was brought to [facility]. She was admitted to medicine and transferred to [facility] for operative management. She is reporting left hip pain. No other pain.. Denies paresthesias and numbness.   Review of systems: General: denies fevers and chills, myalgias Neurologic: denies recent changes in vision, slurred speech Abdomen: denies nausea, vomiting, hematemesis Respiratory: denies cough, shortness of breath  Past medical history:   Depression/anxiety DM GERD Hemorrhoids HTN HLD Hiatal hernia IBS Lymphocytic colitis History of TIA  Allergies: 49 listed. Importantly, no anaphylaxis to cephalosporins    Past surgical history:  Right ankle fracture ORIF Cholecystectomy Hernia repair Hysterectomy Cataract surgery Bladder surgery  Social history: Denies use of nicotine-containing products (cigarettes, vaping, smokeless, etc.) Alcohol use: denies Denies use of recreational drugs  Family history: -reviewed and not pertinent to hip fracture   Physical Exam:  General: no acute distress, appears stated age Neurologic: alert, answering questions appropriately, following commands Cardiovascular: regular rate, no cyanosis Respiratory: unlabored breathing on room air, symmetric chest rise Psychiatric: appropriate affect, normal cadence to speech  MSK:   -Bilateral upper extremities  No tenderness to palpation over extremity, no gross deformity, no open wounds Fires deltoid, biceps, triceps, wrist extensors, wrist flexors, finger extensors, finger flexors  AIN/PIN/IO intact  Palpable radial pulse  Sensation intact to light touch in median/ulnar/radial/axillary nerve distributions  Hand warm and well perfused  -Right lower extremity  No tenderness to palpation over extremity, no gross deformity, no open wounds, no pain with log roll Fires hip flexors, quadriceps, hamstrings, tibialis anterior, gastrocnemius and soleus, extensor hallucis longus Plantarflexes and dorsiflexes toes Sensation intact to light touch in sural, saphenous, tibial, deep peroneal, and superficial peroneal nerve distributions Foot warm and well perfused, palpable DP pulse  -Left lower extremity  No tenderness to palpation over extremity, except around the hip. Pain with log roll. No gross deformity. No open wounds  Fires hip flexors, quadriceps, hamstrings, tibialis anterior, gastrocnemius and soleus, extensor hallucis  longus Plantarflexes and dorsiflexes toes Sensation intact to light touch in sural, saphenous, tibial, deep peroneal, and superficial peroneal nerve distributions Foot warm and well perfused, palpable DP pulse  Imaging: XR of the left hip from 12/04/2023 was independently reviewed and interpreted, showing intertrochanteric femur fracture.  Displaced lesser trochanter. Comminution seen. No other fractures seen. No dislocation seen.    Patient name: Cynthia Gilmore Patient MRN: [id number] Date: 12/05/2023  Pre-operative Scores  VAS leg: 10/10

## 2023-12-04 NOTE — ED Provider Notes (Signed)
 Emergency Department Provider Note   I have reviewed the triage vital signs and the nursing notes.   HISTORY  Chief Complaint Fall and Leg Pain   HPI Cynthia Gilmore is a 89 y.o. female with PMH of HTN, IBS, CVA presents to the ED after fall. She lives at home and was walking to get the doorbell when she lost her balance and fell. She did sustain a mild head injury but no LOC. No arm pain, CP, or abdominal pain.    Past Medical History:  Diagnosis Date   Allergy    Anxiety    Anxiety and depression    ? bipolar   Cataract    bil cataracts removed   Depression    Diabetes mellitus, type 2 (HCC)    GERD (gastroesophageal reflux disease)    Heart murmur    Hemorrhoids 2012    Hiatal hernia    Hypertension    IBS (irritable bowel syndrome)    Lymphocytic colitis 2012   Mumerous medication allergies and sensitivities 07/20/2015   Osteoarthritis (arthritis due to wear and tear of joints)    Segmental colitis (HCC)    iscemic colitis 2009   Stroke [facility])    states 2 'mini strokes 1980   TIA (transient ischemic attack) 2000   x 3     Review of Systems  Constitutional: No fever/chills Cardiovascular: Denies chest pain. Respiratory: Denies shortness of breath. Gastrointestinal: No abdominal pain.  No nausea, no vomiting.  No diarrhea.  No constipation. Musculoskeletal: Negative for back pain. Positive left hip pain.  Neurological: Negative for headaches, focal weakness or numbness.  ____________________________________________   PHYSICAL EXAM:  VITAL SIGNS: ED Triage Vitals [12/04/23 1805]  Encounter Vitals Group     BP 101/88     Pulse Rate 69     Resp 18     Temp 97.9 F (36.6 C)     Temp Source Oral     SpO2 100 %     Weight 140 lb (63.5 kg)     Height 5' 5 (1.651 m)   Constitutional: Alert and oriented. Well appearing and in no acute distress. Eyes: Conjunctivae are normal.  Head: Atraumatic. Nose: No congestion/rhinnorhea. Mouth/Throat:  Mucous membranes are moist.  Neck: No stridor.  No cervical spine tenderness to palpation. Cardiovascular: Normal rate, regular rhythm. Good peripheral circulation. Grossly normal heart sounds.   Respiratory: Normal respiratory effort.  No retractions. Lungs CTAB. Gastrointestinal: Soft and nontender. No distention.  Musculoskeletal: Slight shortening of the left lower extremity. Soft compartments. No tenderness to the knee or ankle. Normal ROM of the right lower extremity and bilateral upper extremities.  Neurologic:  Normal speech and language. No gross focal neurologic deficits are appreciated.  Skin:  Skin is warm, dry and intact. No rash noted.  ____________________________________________   LABS (all labs ordered are listed, but only abnormal results are displayed)  Labs Reviewed  BASIC METABOLIC PANEL - Abnormal; Notable for the following components:      Result Value   Glucose, Bld 134 (*)    Calcium  8.7 (*)    All other components within normal limits  CBC WITH DIFFERENTIAL/PLATELET - Abnormal; Notable for the following components:   WBC 11.2 (*)    Hemoglobin 11.3 (*)    HCT 34.9 (*)    Neutro Abs 7.8 (*)    All other components within normal limits  TYPE AND SCREEN   ____________________________________________  EKG  NSR. No ST changes. No STEMI.  ____________________________________________  RADIOLOGY  CT Head Wo Contrast Result Date: 12/04/2023 CLINICAL DATA:  Fall EXAM: CT HEAD WITHOUT CONTRAST TECHNIQUE: Contiguous axial images were obtained from the base of the skull through the vertex without intravenous contrast. RADIATION DOSE REDUCTION: This exam was performed according to the departmental dose-optimization program which includes automated exposure control, adjustment of the mA and/or kV according to patient size and/or use of iterative reconstruction technique. COMPARISON:  02/23/2023 FINDINGS: Brain: No evidence of acute infarction, hemorrhage, mass, mass  effect, or midline shift. No hydrocephalus or extra-axial fluid collection. Partial empty sella. Normal craniocervical junction. Vascular: No hyperdense vessel. Skull: Negative for fracture or focal lesion. Sinuses/Orbits: No acute finding. Other: The mastoid air cells are well aerated. IMPRESSION: No acute intracranial process. Electronically Signed   By: Donald Campion M.D.   On: 12/04/2023 19:41   DG Chest Portable 1 View Result Date: 12/04/2023 CLINICAL DATA:  Mechanical fall.  Hip and knee pain. EXAM: PORTABLE CHEST 1 VIEW COMPARISON:  Chest radiograph 02/23/2023 FINDINGS: The cardiomediastinal contours are normal. Aortic atherosclerosis. Pulmonary vasculature is normal. No consolidation, pleural effusion, or pneumothorax. No acute osseous abnormalities are seen. IMPRESSION: No active disease. Electronically Signed   By: Andrea Gasman M.D.   On: 12/04/2023 19:04   DG Hip Unilat W or Wo Pelvis 2-3 Views Left Result Date: 12/04/2023 CLINICAL DATA:  Fall, left hip pain EXAM: DG HIP (WITH OR WITHOUT PELVIS) 2-3V LEFT COMPARISON:  None Available. FINDINGS: Mildly displaced intertrochanteric left hip fracture. Bilateral hip joint spaces are preserved. Visualized bony pelvis appears intact. IMPRESSION: Mildly displaced intertrochanteric left hip fracture. Electronically Signed   By: Pinkie Pebbles M.D.   On: 12/04/2023 19:02    ____________________________________________   PROCEDURES  Procedure(s) performed:   Procedures  None  ____________________________________________   INITIAL IMPRESSION / ASSESSMENT AND PLAN / ED COURSE  Pertinent labs & imaging results that were available during my care of the patient were reviewed by me and considered in my medical decision making (see chart for details).   This patient is Presenting for Evaluation of hip pain, which does require a range of treatment options, and is a complaint that involves a high risk of morbidity and mortality.  The  Differential Diagnoses include fracture, dislocation, contusion, strain, etc.   Critical Interventions-    Medications  morphine  (PF) 4 MG/ML injection 4 mg (4 mg Intravenous Given 12/04/23 2001)  ceFAZolin  (ANCEF ) IVPB 2g/100 mL premix (has no administration in time range)  tranexamic acid  (CYKLOKAPRON ) IVPB 1,000 mg (has no administration in time range)  morphine  (PF) 2 MG/ML injection 2 mg (2 mg Intravenous Given 12/04/23 1844)  ondansetron  (ZOFRAN ) injection 4 mg (4 mg Intravenous Given 12/04/23 1844)    Reassessment after intervention:  pain improved.    I did obtain Additional Historical Information from daughter at beside.    Clinical Laboratory Tests Ordered, included CBC with mild leukocytosis to 11.2.  Mild anemia 11.3.  No acute kidney injury.  Radiologic Tests Ordered, included XR left hip, CXR, and CT head. I independently interpreted the images and agree with radiology interpretation.   Cardiac Monitor Tracing which shows NSR.    Social Determinants of Health Risk patient lives at home and cares for her elderly husband.  Consult complete with Orthopedics, Dr. Georgina. Plan for TRH admit to [facility] for operative mgmt of the hip.   TRH. Plan for admit.   Medical Decision Making: Summary:  Patient presents the emergency department for evaluation after mechanical  fall at home.  No near syncope or prodrome symptoms prior to fall.  She has a clinically concerning exam for fracture.  The foot is neurovascularly intact.  Plan for imaging and reassess.  Reevaluation with update and discussion with patient and daughter at bedside.  She has a left intertrochanteric hip fracture.  Plan will be for operative repair and admission.  She is in agreement.  She is not established with an orthopedist.  Patient's presentation is most consistent with acute presentation with potential threat to life or bodily function.   Disposition:  admit  ____________________________________________  FINAL CLINICAL IMPRESSION(S) / ED DIAGNOSES  Final diagnoses:  Fall, initial encounter  Closed displaced intertrochanteric fracture of left femur, initial encounter [facility])    Note:  This document was prepared using Dragon voice recognition software and may include unintentional dictation errors.  Fonda Law, MD, [facility]    Canisha Issac, Fonda MATSU, MD 12/04/23 2125

## 2023-12-04 NOTE — Assessment & Plan Note (Addendum)
 Patient has no history of Afib, has had palpitations worked up by her Cardiologist and has NSVT treated with metoprolol.

## 2023-12-04 NOTE — Assessment & Plan Note (Addendum)
 S/p intramedullary nailing 1/11 by Dr. Christell Constant - [name] - PT/OT - SW

## 2023-12-05 ENCOUNTER — Inpatient Hospital Stay (HOSPITAL_COMMUNITY): Payer: Medicare Other | Admitting: Anesthesiology

## 2023-12-05 ENCOUNTER — Inpatient Hospital Stay (HOSPITAL_COMMUNITY): Payer: Medicare Other

## 2023-12-05 ENCOUNTER — Other Ambulatory Visit: Payer: Self-pay

## 2023-12-05 ENCOUNTER — Encounter (HOSPITAL_COMMUNITY): Payer: Self-pay | Admitting: Internal Medicine

## 2023-12-05 ENCOUNTER — Encounter (HOSPITAL_COMMUNITY)
Admission: EM | Disposition: A | Discharge status: Home Health | Payer: Self-pay | Source: Home / Self Care | Attending: Family Medicine

## 2023-12-05 DIAGNOSIS — I10 Essential (primary) hypertension: Secondary | ICD-10-CM

## 2023-12-05 DIAGNOSIS — F418 Other specified anxiety disorders: Secondary | ICD-10-CM

## 2023-12-05 DIAGNOSIS — S72142A Displaced intertrochanteric fracture of left femur, initial encounter for closed fracture: Secondary | ICD-10-CM

## 2023-12-05 DIAGNOSIS — E119 Type 2 diabetes mellitus without complications: Secondary | ICD-10-CM

## 2023-12-05 DIAGNOSIS — E1149 Type 2 diabetes mellitus with other diabetic neurological complication: Secondary | ICD-10-CM

## 2023-12-05 DIAGNOSIS — I471 Supraventricular tachycardia, unspecified: Secondary | ICD-10-CM | POA: Diagnosis not present

## 2023-12-05 DIAGNOSIS — S72102A Unspecified trochanteric fracture of left femur, initial encounter for closed fracture: Secondary | ICD-10-CM | POA: Diagnosis not present

## 2023-12-05 DIAGNOSIS — F419 Anxiety disorder, unspecified: Secondary | ICD-10-CM | POA: Diagnosis not present

## 2023-12-05 HISTORY — PX: INTRAMEDULLARY (IM) NAIL INTERTROCHANTERIC: SHX5875

## 2023-12-05 LAB — CBC
HCT: 30 % — ABNORMAL LOW (ref 36.0–46.0)
HCT: 30.8 % — ABNORMAL LOW (ref 36.0–46.0)
Hemoglobin: 10.1 g/dL — ABNORMAL LOW (ref 12.0–15.0)
Hemoglobin: 9.8 g/dL — ABNORMAL LOW (ref 12.0–15.0)
MCH: 29.3 pg (ref 26.0–34.0)
MCH: 29.8 pg (ref 26.0–34.0)
MCHC: 32.7 g/dL (ref 30.0–36.0)
MCHC: 32.8 g/dL (ref 30.0–36.0)
MCV: 89.8 fL (ref 80.0–100.0)
MCV: 90.9 fL (ref 80.0–100.0)
Platelets: 202 10*3/uL (ref 150–400)
Platelets: 204 10*3/uL (ref 150–400)
RBC: 3.34 MIL/uL — ABNORMAL LOW (ref 3.87–5.11)
RBC: 3.39 MIL/uL — ABNORMAL LOW (ref 3.87–5.11)
RDW: 13.2 % (ref 11.5–15.5)
RDW: 13.2 % (ref 11.5–15.5)
WBC: 8.3 10*3/uL (ref 4.0–10.5)
WBC: 9.3 10*3/uL (ref 4.0–10.5)
nRBC: 0 % (ref 0.0–0.2)
nRBC: 0 % (ref 0.0–0.2)

## 2023-12-05 LAB — CREATININE, SERUM
Creatinine, Ser: 0.57 mg/dL (ref 0.44–1.00)
GFR, Estimated: >60 mL/min (ref >60)

## 2023-12-05 LAB — BASIC METABOLIC PANEL
Anion gap: 9 (ref 5–15)
BUN: 12 mg/dL (ref 8–23)
CO2: 26 mmol/L (ref 22–32)
Calcium: 8.2 mg/dL — ABNORMAL LOW (ref 8.9–10.3)
Chloride: 105 mmol/L (ref 98–111)
Creatinine, Ser: 0.5 mg/dL (ref 0.44–1.00)
GFR, Estimated: >60 mL/min (ref >60)
Glucose, Bld: 109 mg/dL — ABNORMAL HIGH (ref 70–99)
Potassium: 3.9 mmol/L (ref 3.5–5.1)
Sodium: 140 mmol/L (ref 135–145)

## 2023-12-05 LAB — ABO/RH: ABO/RH(D): O NEG

## 2023-12-05 LAB — TYPE AND SCREEN
ABO/RH(D): O NEG
Antibody Screen: NEGATIVE

## 2023-12-05 LAB — APTT: aPTT: 25 s (ref 24–36)

## 2023-12-05 LAB — PROTIME-INR
INR: 1 (ref 0.8–1.2)
Prothrombin Time: 13.7 s (ref 11.4–15.2)

## 2023-12-05 LAB — GLUCOSE, CAPILLARY
Glucose-Capillary: 126 mg/dL — ABNORMAL HIGH (ref 70–99)
Glucose-Capillary: 166 mg/dL — ABNORMAL HIGH (ref 70–99)
Glucose-Capillary: 164 mg/dL — ABNORMAL HIGH (ref 70–99)

## 2023-12-05 LAB — HEMOGLOBIN A1C
Hgb A1c MFr Bld: 6.1 % — ABNORMAL HIGH (ref 4.8–5.6)
Mean Plasma Glucose: 128.37 mg/dL

## 2023-12-05 SURGERY — FIXATION, FRACTURE, INTERTROCHANTERIC, WITH INTRAMEDULLARY ROD
Anesthesia: General | Site: Hip | Laterality: Left

## 2023-12-05 MED ORDER — POLYETHYLENE GLYCOL 3350 17 G PO PACK
17.0000 g | PACK | Freq: Every day | ORAL | Status: DC
  Administered 2023-12-05 – 2023-12-09 (×5): 17 g via ORAL
  Filled 2023-12-05 (×5): qty 1

## 2023-12-05 MED ORDER — CHLORHEXIDINE GLUCONATE 0.12 % MT SOLN
OROMUCOSAL | Status: AC
  Administered 2023-12-05: 15 mL
  Filled 2023-12-05: qty 15

## 2023-12-05 MED ORDER — TRANEXAMIC ACID-NACL 1000-0.7 MG/100ML-% IV SOLN
INTRAVENOUS | Status: AC
  Filled 2023-12-05: qty 100

## 2023-12-05 MED ORDER — ONDANSETRON HCL 4 MG/2ML IJ SOLN
INTRAMUSCULAR | Status: DC | PRN
  Administered 2023-12-05: 4 mg via INTRAVENOUS

## 2023-12-05 MED ORDER — INSULIN ASPART 100 UNIT/ML IJ SOLN: 0.0000 [IU] | INTRAMUSCULAR | Status: DC | PRN

## 2023-12-05 MED ORDER — CEFAZOLIN SODIUM-DEXTROSE 2-4 GM/100ML-% IV SOLN
INTRAVENOUS | Status: AC
  Filled 2023-12-05: qty 100

## 2023-12-05 MED ORDER — FENTANYL CITRATE (PF) 250 MCG/5ML IJ SOLN
INTRAMUSCULAR | Status: DC | PRN
  Administered 2023-12-05: 50 ug via INTRAVENOUS
  Administered 2023-12-05: 100 ug via INTRAVENOUS

## 2023-12-05 MED ORDER — METOPROLOL SUCCINATE ER 25 MG PO TB24
50.0000 mg | ORAL_TABLET | Freq: Every day | ORAL | Status: DC
  Administered 2023-12-05 – 2023-12-09 (×5): 50 mg via ORAL
  Filled 2023-12-05 (×5): qty 2

## 2023-12-05 MED ORDER — OXYCODONE HCL 5 MG PO TABS
5.0000 mg | ORAL_TABLET | ORAL | Status: DC | PRN
Start: 2023-12-05 — End: 2023-12-09
  Administered 2023-12-05: 7.5 mg via ORAL
  Administered 2023-12-06 (×2): 5 mg via ORAL
  Administered 2023-12-06: 7.5 mg via ORAL
  Administered 2023-12-06 – 2023-12-08 (×4): 5 mg via ORAL
  Filled 2023-12-05 (×6): qty 1
  Filled 2023-12-05 (×2): qty 2

## 2023-12-05 MED ORDER — DEXAMETHASONE SODIUM PHOSPHATE 10 MG/ML IJ SOLN
INTRAMUSCULAR | Status: DC | PRN
  Administered 2023-12-05: 10 mg via INTRAVENOUS

## 2023-12-05 MED ORDER — FENTANYL CITRATE (PF) 100 MCG/2ML IJ SOLN
INTRAMUSCULAR | Status: AC
  Filled 2023-12-05: qty 2

## 2023-12-05 MED ORDER — CEFAZOLIN SODIUM-DEXTROSE 2-4 GM/100ML-% IV SOLN
2.0000 g | Freq: Four times a day (QID) | INTRAVENOUS | Status: AC
  Administered 2023-12-05 – 2023-12-06 (×3): 2 g via INTRAVENOUS
  Filled 2023-12-05 (×3): qty 100

## 2023-12-05 MED ORDER — ONDANSETRON HCL 4 MG/2ML IJ SOLN: 4.0000 mg | Freq: Four times a day (QID) | INTRAMUSCULAR | Status: DC | PRN

## 2023-12-05 MED ORDER — ROCURONIUM BROMIDE 10 MG/ML (PF) SYRINGE
PREFILLED_SYRINGE | INTRAVENOUS | Status: DC | PRN
  Administered 2023-12-05: 50 mg via INTRAVENOUS

## 2023-12-05 MED ORDER — MORPHINE SULFATE (PF) 2 MG/ML IV SOLN
2.0000 mg | INTRAVENOUS | Status: DC | PRN
  Administered 2023-12-05: 2 mg via INTRAVENOUS
  Filled 2023-12-05: qty 1

## 2023-12-05 MED ORDER — EPHEDRINE SULFATE (PRESSORS) 50 MG/ML IJ SOLN
INTRAMUSCULAR | Status: DC | PRN
  Administered 2023-12-05 (×2): 5 mg via INTRAVENOUS

## 2023-12-05 MED ORDER — FENTANYL CITRATE (PF) 100 MCG/2ML IJ SOLN
25.0000 ug | INTRAMUSCULAR | Status: DC | PRN
  Administered 2023-12-05: 50 ug via INTRAVENOUS

## 2023-12-05 MED ORDER — DULOXETINE HCL 60 MG PO CPEP
60.0000 mg | ORAL_CAPSULE | Freq: Every day | ORAL | Status: DC
  Administered 2023-12-06 – 2023-12-09 (×4): 60 mg via ORAL
  Filled 2023-12-05 (×4): qty 1

## 2023-12-05 MED ORDER — HYDROMORPHONE HCL 1 MG/ML IJ SOLN
INTRAMUSCULAR | Status: DC | PRN
Start: 2023-12-05 — End: 2023-12-05
  Administered 2023-12-05: .5 mg via INTRAVENOUS

## 2023-12-05 MED ORDER — ASPIRIN 81 MG PO CHEW
81.0000 mg | CHEWABLE_TABLET | Freq: Two times a day (BID) | ORAL | Status: DC
  Administered 2023-12-05 – 2023-12-09 (×8): 81 mg via ORAL
  Filled 2023-12-05 (×8): qty 1

## 2023-12-05 MED ORDER — 0.9 % SODIUM CHLORIDE (POUR BTL) OPTIME
TOPICAL | Status: DC | PRN
  Administered 2023-12-05: 1000 mL

## 2023-12-05 MED ORDER — HYDROMORPHONE HCL 1 MG/ML IJ SOLN
INTRAMUSCULAR | Status: AC
  Filled 2023-12-05: qty 0.5

## 2023-12-05 MED ORDER — SUGAMMADEX SODIUM 200 MG/2ML IV SOLN
INTRAVENOUS | Status: DC | PRN
  Administered 2023-12-05: 200 mg via INTRAVENOUS

## 2023-12-05 MED ORDER — TRANEXAMIC ACID-NACL 1000-0.7 MG/100ML-% IV SOLN
1000.0000 mg | Freq: Once | INTRAVENOUS | Status: DC
  Filled 2023-12-05: qty 100

## 2023-12-05 MED ORDER — LIDOCAINE 2% (20 MG/ML) 5 ML SYRINGE
INTRAMUSCULAR | Status: DC | PRN
  Administered 2023-12-05: 50 mg via INTRAVENOUS

## 2023-12-05 MED ORDER — FENTANYL CITRATE (PF) 250 MCG/5ML IJ SOLN
INTRAMUSCULAR | Status: AC
  Filled 2023-12-05: qty 5

## 2023-12-05 MED ORDER — SODIUM CHLORIDE 0.9 % IV SOLN: INTRAVENOUS | Status: DC | PRN

## 2023-12-05 MED ORDER — PROPOFOL 10 MG/ML IV BOLUS
INTRAVENOUS | Status: DC | PRN
  Administered 2023-12-05: 20 mg via INTRAVENOUS
  Administered 2023-12-05: 30 mg via INTRAVENOUS
  Administered 2023-12-05: 90 mg via INTRAVENOUS

## 2023-12-05 SURGICAL SUPPLY — 35 items
BIT DRILL INTERTAN LAG SCREW (BIT) IMPLANT
BIT DRILL LONG 4.0 (BIT) IMPLANT
BNDG COHESIVE 6X5 TAN NS LF (GAUZE/BANDAGES/DRESSINGS) ×1 IMPLANT
CANISTER SUCT 3000ML PPV (MISCELLANEOUS) ×1 IMPLANT
COVER PERINEAL POST (MISCELLANEOUS) ×1 IMPLANT
COVER SURGICAL LIGHT HANDLE (MISCELLANEOUS) ×1 IMPLANT
DRAPE C-ARM 42X72 X-RAY (DRAPES) ×1 IMPLANT
DRAPE STERI IOBAN 125X83 (DRAPES) ×1 IMPLANT
DRILL BIT LONG 4.0 (BIT) ×1
DRSG TEGADERM 4X4.75 (GAUZE/BANDAGES/DRESSINGS) IMPLANT
DRSG XEROFORM 1X8 (GAUZE/BANDAGES/DRESSINGS) IMPLANT
DURAPREP 26ML APPLICATOR (WOUND CARE) ×1 IMPLANT
ELECT REM PT RETURN 9FT ADLT (ELECTROSURGICAL) ×1
ELECTRODE REM PT RTRN 9FT ADLT (ELECTROSURGICAL) ×1 IMPLANT
GAUZE PAD ABD 8X10 STRL (GAUZE/BANDAGES/DRESSINGS) ×1 IMPLANT
GAUZE SPONGE 4X4 12PLY STRL (GAUZE/BANDAGES/DRESSINGS) ×1 IMPLANT
GAUZE XEROFORM 1X8 LF (GAUZE/BANDAGES/DRESSINGS) ×1 IMPLANT
GLOVE BIOGEL PI IND STRL 8 (GLOVE) ×1 IMPLANT
GLOVE PI ORTHO PRO STRL 7.5 (GLOVE) ×1 IMPLANT
GOWN STRL REUS W/TWL XL LVL3 (GOWN DISPOSABLE) ×1 IMPLANT
GUIDE PIN 3.2X343 (PIN) ×2
GUIDEROD BALL TIP 3.0X800 (ORTHOPEDIC DISPOSABLE SUPPLIES) IMPLANT
KIT BASIN OR (CUSTOM PROCEDURE TRAY) ×1 IMPLANT
KIT TURNOVER KIT B (KITS) ×1 IMPLANT
MANIFOLD NEPTUNE II (INSTRUMENTS) ×1 IMPLANT
NAIL TRIGEN INTERTAN 10X18CM (Nail) IMPLANT
NS IRRIG 1000ML POUR BTL (IV SOLUTION) ×1 IMPLANT
PACK GENERAL/GYN (CUSTOM PROCEDURE TRAY) ×1 IMPLANT
PAD ARMBOARD 7.5X6 YLW CONV (MISCELLANEOUS) ×1 IMPLANT
PIN GUIDE 3.2X343MM (PIN) IMPLANT
SCREW LAG COMPR KIT 85/80 (Screw) IMPLANT
SCREW TRIGEN LOW PROF 5.0X35 (Screw) IMPLANT
SUT VIC AB 0 CT1 18XCR BRD 8 (SUTURE) ×1 IMPLANT
SUT VIC AB 2-0 CT1 18 (SUTURE) ×1 IMPLANT
WATER STERILE IRR 1000ML POUR (IV SOLUTION) ×1 IMPLANT

## 2023-12-05 NOTE — Anesthesia Procedure Notes (Signed)
 Procedure Name: Intubation Date/Time: 12/05/2023 11:25 AM  Performed by: Mollie Olivia SAUNDERS, CRNAPre-anesthesia Checklist: Patient identified, Emergency Drugs available, Suction available and Patient being monitored Patient Re-evaluated:Patient Re-evaluated prior to induction Oxygen Delivery Method: Circle system utilized Preoxygenation: Pre-oxygenation with 100% oxygen Induction Type: IV induction Ventilation: Mask ventilation without difficulty Laryngoscope Size: Glidescope and 3 Grade View: Grade I Tube type: Oral Tube size: 7.0 mm Number of attempts: 1 Airway Equipment and Method: Stylet Placement Confirmation: ETT inserted through vocal cords under direct vision, positive ETCO2 and breath sounds checked- equal and bilateral Secured at: 22 cm Tube secured with: Tape Dental Injury: Teeth and Oropharynx as per pre-operative assessment

## 2023-12-05 NOTE — Progress Notes (Signed)
  Progress Note   Patient: Cynthia Gilmore FMW:[id number] DOB: [birth year] DOA: 12/04/2023     1 DOS: the patient was seen and examined on 12/05/2023        Brief hospital course: 89 y.o. F with HTN, DM, anxiety, NSVT who presented with left hip fracture.     Assessment and Plan: * Femoral fracture (HCC) S/p intramedullary nailing 1/11 by Dr. Georgina - [facility] - PT/OT - SW    Anxiety - Resume Cymbalta , Xanax   Paroxysmal supraventricular tachycardia (HCC) Patient has no history of Afib, has had palpitations worked up by her Cardiologist and has NSVT treated with metoprolol . - Continue metoprolol   Type II diabetes mellitus with neurological manifestations (HCC) Glucose okay - SS corrections post-op - Hold metformin   Essential hypertension - Resume metoprolol  - Hold losartan , spironolactone  for now          Subjective: Patient is sleepy postop, denies headache, chest pain, dyspnea, focal weakness.  Nursing have no concerns.     Physical Exam: BP (!) 140/70 (BP Location: Left Arm)   Pulse 95   Temp (!) 97.4 F (36.3 C)   Resp 13   Ht 5' 5 (1.651 m)   Wt 63.5 kg   SpO2 97%   BMI 23.30 kg/m   Elderly adult female, lying in bed, sleeping but arouses and makes eye contact Tachycardic, regular, no murmurs, no peripheral edema Respiratory rate seems normal, lungs clear without rales or wheezes Abdomen without grimace to palpation, no guarding Sleepy but expected after anesthesia, moves upper extremities with generalized weakness but symmetric strength, speech fluent, face symmetric, oriented to self, hospital    Data Reviewed: Basic metabolic panel unremarkable CBC shows mild anemia      Disposition: Status is: Inpatient         Author: Lonni SHAUNNA Dalton, MD 12/05/2023 2:31 PM  For on call review [email or web address].

## 2023-12-05 NOTE — Anesthesia Preprocedure Evaluation (Signed)
 Anesthesia Evaluation  Patient identified by MRN, date of birth, ID band Patient awake    Reviewed: Allergy & Precautions, H&P , NPO status , Patient's Chart, lab work & pertinent test results  Airway Mallampati: II   Neck ROM: full    Dental   Pulmonary former smoker   breath sounds clear to auscultation       Cardiovascular hypertension,  Rhythm:regular Rate:Normal     Neuro/Psych  PSYCHIATRIC DISORDERS Anxiety Depression    CVA    GI/Hepatic hiatal hernia,GERD  ,,  Endo/Other  diabetes, Type 2    Renal/GU      Musculoskeletal  (+) Arthritis ,    Abdominal   Peds  Hematology   Anesthesia Other Findings   Reproductive/Obstetrics                             Anesthesia Physical Anesthesia Plan  ASA: 3  Anesthesia Plan: General   Post-op Pain Management:    Induction: Intravenous  PONV Risk Score and Plan: 3 and Ondansetron , Dexamethasone  and Treatment may vary due to age or medical condition  Airway Management Planned: Oral ETT  Additional Equipment:   Intra-op Plan:   Post-operative Plan: Extubation in OR  Informed Consent: I have reviewed the patients History and Physical, chart, labs and discussed the procedure including the risks, benefits and alternatives for the proposed anesthesia with the patient or authorized representative who has indicated his/her understanding and acceptance.     Dental advisory given  Plan Discussed with: CRNA, Anesthesiologist and Surgeon  Anesthesia Plan Comments:        Anesthesia Quick Evaluation

## 2023-12-05 NOTE — Plan of Care (Signed)
  Problem: Education: Goal: Ability to describe self-care measures that may prevent or decrease complications (Diabetes Survival Skills Education) will improve Outcome: Progressing   Problem: Coping: Goal: Ability to adjust to condition or change in health will improve Outcome: Progressing   Problem: Nutritional: Goal: Maintenance of adequate nutrition will improve Outcome: Progressing   Problem: Skin Integrity: Goal: Risk for impaired skin integrity will decrease Outcome: Progressing   

## 2023-12-05 NOTE — Assessment & Plan Note (Signed)
 Patient has no history of Afib, has had palpitations worked up by her Cardiologist and has NSVT treated with metoprolol. - Continue metoprolol

## 2023-12-05 NOTE — Assessment & Plan Note (Signed)
-   Resume metoprolol - Hold losartan, spironolactone for now

## 2023-12-05 NOTE — Op Note (Signed)
 Orthopedic Surgery Operative Report   Procedure: Left intertrochanteric femur fracture intramedullary rodding   Modifier: none   Date of procedure: 12/05/2023   Patient name: Cynthia Gilmore MRN: [id number] DOB: 04-10-1934   Surgeon: Ozell Ada, MD Assistant: None Pre-operative diagnosis: left intertrochanteric hip fracture Post-operative diagnosis: same as above Findings: left displaced intertrochanteric femur fracture   Specimens: none Anesthesia: general EBL: 100cc Complications: none Pre-incision antibiotic: ancef  TXA given prior to incision as well   Implants:  Implant Name Type Inv. Item Serial No. Manufacturer Lot No. LRB No. Used Action  NAIL LESTER GAILS 10X18CM - [id number] Nail NAIL TRIGEN INTERTAN 10X18CM  [employer] [id number] Left 1 Implanted  SCREW LAG COMPR KIT 85/80 - [id number] Screw SCREW LAG COMPR KIT 85/80  [employer] [id number] Left 1 Implanted  SCREW TRIGEN LOW PROF 5.0X35 - [id number] Screw SCREW TRIGEN LOW PROF 5.0X35  [employer] [id number] Left 1 Implanted      Indication for procedure: Patient is a 89 year old female who presented to the ER after a fall. The patient had left hip pain and x-rays revealed a left intertrochanteric femur fracture. The patient was admitted to a medicine service with orthopedics consulted. I met the patient and discussed the fracture. I recommended operative management in the form of intramedullary rodding to stabilize the fracture and allow for mobilization. Explained the risks of this procedure included, but were not limited to: nonunion, malunion, fixation failure, infection, screw cut out, bleeding, stiffness, need for additional procedures, deep vein thrombosis, pulmonary embolism, and death. The alternatives of this surgery would be to treat the fracture with traction or to perform no intervention. After our discussion, patient elected to proceed with surgery.     Procedure Description: The patient was met in the pre-operative holding area. The patient's identity and consent were verified. The operative site was marked by myself. The patient's remaining questions about the surgery were answered. The patient was brought back to the operating room. General anesthesia was induced and an endotracheal tube was placed by the anesthesia staff. The patient was transferred to the [facility] table. All bony prominences were well padded. Traction was applied and reduction was attempted with manipulation. Fluoroscopy confirmed a satisfactory reduction. The surgical area was cleansed with alcohol. Ancef  and TXA were administered by anesthesia. The patient's skin was then prepped and draped in a standard, sterile fashion. A time out was performed that identified the patient, the procedure, and the operative site. All team members agreed with what was stated in the time out.    An incision was made just proximal and inferior to the greater trochanter. The incision was taken sharply down through the fascia. A guide pin was inserted into the wound onto the top of the greater trochanter. Fluoroscopy was used to place the guide pin at the starting point at the tip of the greater trochanter and in line with the middle of the femoral neck. The wire was then advanced to a point just past the lesser trochanter. A soft tissue sleeve was advanced over the wire onto the greater trochanter. An entry reamer was used to open the proximal femoral canal under fluoroscopic guidance. The pin and reamer were removed. A long guide wire was placed down the femoral canal. A 6mmx18cm nail was advanced over the guidewire under fluoroscopic guidance. The guidewire was removed.    An incision was made sharply through the skin, dermis, and fascia over the lateral thigh in  the area where the lag screws would be inserted. The lag screw targeter was placed through the jig onto the lateral femoral cortex. A guide wire  was advanced through the lag screw targeter into the femoral head under fluoroscopic guidance. It was found to be in acceptable position on the AP and lateral views. The length of the lag screw was estimated off of the guide wire. A 85mm screw was selected. The inferior lag screw was drilled through the guide. The derotation device was placed through the targeter. The proximal lag screw hole was then drilled over the guide wire. The screw was inserted over the wire under fluoroscopic guidance. The derotation bar was removed and the inferior lag screw was inserted. AP and lateral fluoroscopic images confirmed satisfactory position of the screws in the femoral head.   An incision was made over the distal interlocking screw of the nail. Incision was taken sharply down through the skin, dermis, and fascia. A targeter was placed through the jig onto the lateral femoral cortex. A drill was used to drill the femur bicortically through the nail. The length of the screw was estimated off the drill. A 35mm screw was selected and inserted through the femur and distal hole of the nail. The jig was removed from the nail. Final AP and lateral fluoroscopic images confirmed satisfactory reduction and position of the fixation.    The wounds were copiously irrigated with sterile saline. The fascia was closed with 0 vicryl. The deep dermal layer was closed with 2-0 vicryl. The skin was closed with staples. Dressings were applied. All counts were correct at the end of the case. Patient was transferred back to a hospital bed. The patient was awakened from anesthesia and brought back to the post-anesthesia care unit in stable condition.     Post-operative plan: The patient will recover in the post-anesthesia care unit and then go to the floor on the medicine service. The patient will receive two post-operative doses of ancef . She will also get another dose of TXA. The patient will be weight bearing as tolerated. The patient will  work with physical therapy. The patient's disposition will be determined by the medicine service.        Ozell Ada, MD Orthopedic Surgeon

## 2023-12-05 NOTE — Assessment & Plan Note (Signed)
-   Resume Cymbalta, Xanax

## 2023-12-05 NOTE — Transfer of Care (Signed)
 Immediate Anesthesia Transfer of Care Note  Patient: Cynthia Gilmore  Procedure(s) Performed: INTRAMEDULLARY (IM) NAIL INTERTROCHANTERIC (Left: Hip)  Patient Location: PACU  Anesthesia Type:General  Level of Consciousness: awake, alert , oriented, and drowsy  Airway & Oxygen Therapy: Patient Spontanous Breathing and Patient connected to face mask oxygen  Post-op Assessment: Report given to RN and Post -op Vital signs reviewed and stable  Post vital signs: Reviewed and stable  Last Vitals:  Vitals Value Taken Time  BP 171/67 12/05/23 1252  Temp    Pulse 103 12/05/23 1256  Resp 10 12/05/23 1256  SpO2 99 % 12/05/23 1256  Vitals shown include unfiled device data.  Last Pain:  Vitals:   12/05/23 0609  TempSrc: Oral  PainSc:          Complications: No notable events documented.

## 2023-12-05 NOTE — ED Notes (Signed)
 Pt SpO2 87% on RA, placed pt on 2L, SpO2 increased 96%.

## 2023-12-05 NOTE — Discharge Instructions (Addendum)
 Orthopedic Surgery Discharge Instructions  Patient name: Cynthia Gilmore Fracture: left intertrochanteric femur fracture Procedure Performed: left hip cephalomedullary nail Date of Surgery: 12/05/2023 Surgeon: Ozell Ada, MD  Activity: You are allowed to put as much weight on your leg as you would like. You can walk as much as you would like. You can perform household activities such as cleaning dishes, doing laundry, vacuuming, etc.  Incision Care: Your incision site has a dressing over it. That dressing should remain in place and dry at all times for a total of one week after surgery. After one week, you can remove the dressing. Underneath the dressing, you will find skin staples. You should leave these staples in place. They will be taken out in the office when the wound has healed. Do not pick, rub, or scrub at them. Do not put cream or lotion over the surgical area. After one week and once the dressing is off, it is okay to let soap and water run over your incision. Again, do not pick, scrub, or rub at the staples when bathing. Do not submerge (e.g., take a bath, swim, go in a hot tub, etc.) until six weeks after surgery. There may be some bloody drainage from the incision into the dressing after surgery. This is normal. You do not need to replace the dressing. Continue to leave it in place for the one week as instructed above. Should the dressing become saturated with blood or drainage, please call the office for further instructions.   Medications: You have been prescribed oxycodone . This is a narcotic pain medication and should only be taken as prescribed. You should not drink alcohol or operate heavy machinery (including driving) while taking this medication. The oxycodone  can cause constipation as a side effect. For that reason, you have been prescribed miralax . This is a laxative laxatives. You do not need to take this medication if you develop diarrhea. Should you remain constipated even  while taking the senna and miralax , please use the miralax  twice daily. Tylenol  has been prescribed to be taken every 8 hours, which will give you additional pain relief.   You have been prescribed aspirin  as a blood thinner. This medication is to be taken to prevent blood clots. Take 81 milligrams twice daily. You should refrain from using other blood thinners (warfarin, apixaban, plavix, xarelto, etc.) while using the aspirin . You will need to take this medication for a total of 6 weeks after your surgery.   You should not use over-the-counter NSAIDs (ibuprofen, Aleve, Celebrex, naproxen, meloxicam, etc.) for pain relief because aspirin  is a similar medication. There can be side effects including but not limited to kidney injury and ulcers if you take these type of medications with the aspirin .  In order to set expectations for opioid prescriptions, you will only be prescribed opioids for a total of six weeks after surgery and, at two-weeks after surgery, your opioid prescription will start to tapered (decreased dosage and number of pills). If you have ongoing need for opioid medication six weeks after surgery, you will be referred to pain management. If you are already established with a provider that is giving you opioid medications, you should schedule an appointment with them for six weeks after surgery if you feel you are going to need another prescription. State law only allows for opioid prescriptions one week at a time. If you are running out of opioid medication near the end of the week, please call the office during business hours before  running out so I can send you another prescription.   Diet: You are safe to resume your regular diet after surgery.   Reasons to Call the Office After Surgery: You should feel free to call the office with any concerns or questions you have in the post-operative period, but you should definitely notify the office if you develop: -shortness of breath, chest  pain, or trouble breathing -excessive bleeding, drainage, redness, or swelling around the surgical site -fevers, chills, or pain that is getting worse with each passing day -persistent nausea or vomiting -new weakness in the left leg, new or worsening numbness or tingling in the left leg -other concerns about your surgery  Follow Up Appointments: You have a follow up appointment with Dr. Georgina on 12/23/2023 at 9:45am. Please arrive on time to this appointment. The office phone number and location are listed below.   Office Information:  -Ozell Georgina, MD -Phone number: [phone] -Address: [address]       [place], [place] [address]

## 2023-12-05 NOTE — Hospital Course (Addendum)
 Cynthia Gilmore is a 89 y.o. female with a history of anxiety, depression, diabetes mellitus type 2, GERD, hypertension, TIA.  Patient presented secondary to a fall with resultant left leg pain.  Patient was found to have evidence of a left femoral fracture.  Orthopedic surgery was consulted and performed intramedullary nail placement on 1/11.  Patient is weightbearing as tolerated.  DVT prophylaxis per orthopedic surgery include aspirin  81 mg twice daily.  Patient follow-up with orthopedic surgery as an outpatient.  Patient discharged home with home health physical and Occupational Therapy.

## 2023-12-06 DIAGNOSIS — I10 Essential (primary) hypertension: Secondary | ICD-10-CM | POA: Diagnosis not present

## 2023-12-06 DIAGNOSIS — I471 Supraventricular tachycardia, unspecified: Secondary | ICD-10-CM | POA: Diagnosis not present

## 2023-12-06 DIAGNOSIS — S72102A Unspecified trochanteric fracture of left femur, initial encounter for closed fracture: Secondary | ICD-10-CM | POA: Diagnosis not present

## 2023-12-06 DIAGNOSIS — F419 Anxiety disorder, unspecified: Secondary | ICD-10-CM | POA: Diagnosis not present

## 2023-12-06 LAB — GLUCOSE, CAPILLARY
Glucose-Capillary: 162 mg/dL — ABNORMAL HIGH (ref 70–99)
Glucose-Capillary: 127 mg/dL — ABNORMAL HIGH (ref 70–99)
Glucose-Capillary: 115 mg/dL — ABNORMAL HIGH (ref 70–99)
Glucose-Capillary: 115 mg/dL — ABNORMAL HIGH (ref 70–99)

## 2023-12-06 LAB — CBC
HCT: 25.8 % — ABNORMAL LOW (ref 36.0–46.0)
Hemoglobin: 8.4 g/dL — ABNORMAL LOW (ref 12.0–15.0)
MCH: 29.1 pg (ref 26.0–34.0)
MCHC: 32.6 g/dL (ref 30.0–36.0)
MCV: 89.3 fL (ref 80.0–100.0)
Platelets: 186 10*3/uL (ref 150–400)
RBC: 2.89 MIL/uL — ABNORMAL LOW (ref 3.87–5.11)
RDW: 13 % (ref 11.5–15.5)
WBC: 10.7 10*3/uL — ABNORMAL HIGH (ref 4.0–10.5)
nRBC: 0 % (ref 0.0–0.2)

## 2023-12-06 LAB — BASIC METABOLIC PANEL
Anion gap: 7 (ref 5–15)
BUN: 13 mg/dL (ref 8–23)
CO2: 26 mmol/L (ref 22–32)
Calcium: 8.3 mg/dL — ABNORMAL LOW (ref 8.9–10.3)
Chloride: 103 mmol/L (ref 98–111)
Creatinine, Ser: 0.66 mg/dL (ref 0.44–1.00)
GFR, Estimated: >60 mL/min (ref >60)
Glucose, Bld: 173 mg/dL — ABNORMAL HIGH (ref 70–99)
Potassium: 4.2 mmol/L (ref 3.5–5.1)
Sodium: 136 mmol/L (ref 135–145)

## 2023-12-06 LAB — TYPE AND SCREEN

## 2023-12-06 MED ORDER — ACETAMINOPHEN 500 MG PO TABS
1000.0000 mg | ORAL_TABLET | Freq: Three times a day (TID) | ORAL | Status: DC
  Administered 2023-12-06 – 2023-12-09 (×10): 1000 mg via ORAL
  Filled 2023-12-06 (×10): qty 2

## 2023-12-06 NOTE — Progress Notes (Signed)
 Orthopedic Surgery Progress Note   Assessment: Patient is a 89 y.o. female with left intertrochanteric femur fracture status post CMN   Plan: -Operative plans: complete -Diet: regular -DVT ppx: aspirin  81mg  BID -Antibiotics: ancef  x2 post-op doses -Weight bearing status: as tolerated -PT evaluate and treat -Pain control -Dispo: per primary  ___________________________________________________________________________  Subjective: No acute events overnight. Had a lot of pain but pain is better controlled this morning. Denies paresthesias and numbness in the left lower extremity. Has not worked with PT yet.    Physical Exam:  General: no acute distress, appears stated age Neurologic: alert, answering questions appropriately, following commands Respiratory: unlabored breathing on room air, symmetric chest rise Psychiatric: appropriate affect, normal cadence to speech  MSK:   -Left lower extremity  Dressing over hip c/d/i EHL/TA/GSC intact Plantarflexes and dorsiflexes toes Sensation intact to light touch in sural, saphenous, tibial, deep peroneal, and superficial peroneal nerve distributions Foot warm and well perfused, palpable DP pulse   Patient name: Cynthia Gilmore Patient MRN: [id number] Date: 12/06/23

## 2023-12-06 NOTE — Plan of Care (Signed)

## 2023-12-06 NOTE — TOC CAGE-AID Note (Signed)
 Transition of Care [facility]) - CAGE-AID Screening  Patient Details  Name: Cynthia Gilmore MRN: [id number] Date of Birth: 1934-05-30  Clinical Narrative:  Patient denies any current alcohol or drug use, substance abuse resources not given at this time.  CAGE-AID Screening:   Have You Ever Felt You Ought to Cut Down on Your Drinking or Drug Use?: No Have People Annoyed You By Critizing Your Drinking Or Drug Use?: No Have You Felt Bad Or Guilty About Your Drinking Or Drug Use?: No Have You Ever Had a Drink or Used Drugs First Thing In The Morning to Steady Your Nerves or to Get Rid of a Hangover?: No CAGE-AID Score: 0  Substance Abuse Education Offered: No

## 2023-12-06 NOTE — Plan of Care (Signed)
  Problem: Elimination: Goal: Will not experience complications related to urinary retention Outcome: Progressing   Problem: Pain Management: Goal: General experience of comfort will improve Outcome: Progressing   Problem: Safety: Goal: Ability to remain free from injury will improve Outcome: Progressing   Problem: Skin Integrity: Goal: Risk for impaired skin integrity will decrease Outcome: Progressing

## 2023-12-06 NOTE — Evaluation (Signed)
 Occupational Therapy Evaluation Patient Details Name: Cynthia Gilmore MRN: [id number] DOB: 09-16-1934 Today's Date: 12/06/2023   History of Present Illness 89 y.o. female who had a ground level fall at home. Past medical history:   Depression/anxiety  DM  GERD  Hemorrhoids  HTN  HLD  Hiatal hernia  IBS  Lymphocytic colitis  History of TIA.  S/p IM Nail.   Clinical Impression   Patient admitted for the diagnosis and procedure above.  PTA, she lives with and is the primary CG for her spouse.  The spouse has RN,CNA and PT at the home from [facility], and the spouse will also be receiving services from them as well.  Patient has a history of falls.  Currently she is needing Min A and bed functions for supine to sit, Min A to stand, and closer to [facility] for short bouts of mobility.  Mod A for lower body ADL due to pain, she does have a reacher.  Patient will benefit from continued OT to address deficits, and HH OT is recommended post acute.         If plan is discharge home, recommend the following: Assist for transportation;Assistance with cooking/housework;A lot of help with bathing/dressing/bathroom;A little help with walking and/or transfers    Functional Status Assessment  Patient has had a recent decline in their functional status and demonstrates the ability to make significant improvements in function in a reasonable and predictable amount of time.  Equipment Recommendations  None recommended by OT    Recommendations for Other Services       Precautions / Restrictions Precautions Precautions: Fall Restrictions Weight Bearing Restrictions Per Provider Order: Yes LLE Weight Bearing Per Provider Order: Weight bearing as tolerated      Mobility Bed Mobility Overal bed mobility: Needs Assistance Bed Mobility: Supine to Sit     Supine to sit: Min assist     General bed mobility comments: assist to advance L leg    Transfers Overall transfer level: Needs assistance Equipment  used: Rolling walker (2 wheels) Transfers: Sit to/from Stand, Bed to chair/wheelchair/BSC Sit to Stand: Min assist, Mod assist     Step pivot transfers: Contact guard assist            Balance Overall balance assessment: Needs assistance Sitting-balance support: Feet supported Sitting balance-Leahy Scale: Fair     Standing balance support: Reliant on assistive device for balance Standing balance-Leahy Scale: Poor                             ADL either performed or assessed with clinical judgement   ADL       Grooming: Wash/dry hands;Wash/dry face;Set up;Sitting       Lower Body Bathing: Minimal assistance;Sit to/from stand       Lower Body Dressing: Moderate assistance;Sit to/from stand   Toilet Transfer: Contact guard assist;Minimal assistance;Rolling walker (2 wheels);BSC/3in1             General ADL Comments: Difficulty with sit to stand     Vision Patient Visual Report: No change from baseline       Perception Perception: Not tested       Praxis Praxis: Not tested       Pertinent Vitals/Pain Pain Assessment Pain Assessment: Faces Faces Pain Scale: Hurts little more Pain Location: L hip Pain Descriptors / Indicators: Sore, Grimacing, Guarding Pain Intervention(s): Monitored during session     Extremity/Trunk Assessment Upper Extremity Assessment  Upper Extremity Assessment: Overall WFL for tasks assessed   Lower Extremity Assessment Lower Extremity Assessment: Defer to PT evaluation   Cervical / Trunk Assessment Cervical / Trunk Assessment: Normal   Communication Communication Communication: No apparent difficulties   Cognition Arousal: Alert Behavior During Therapy: WFL for tasks assessed/performed Overall Cognitive Status: Within Functional Limits for tasks assessed                                       General Comments   VSS on RA    Exercises     Shoulder Instructions      Home Living  Family/patient expects to be discharged to:: Private residence Living Arrangements: Spouse/significant other Available Help at Discharge: Family;Available 24 hours/day Type of Home: House Home Access: Ramped entrance     Home Layout: One level     Bathroom Shower/Tub: Producer, Television/film/video: Handicapped height Bathroom Accessibility: Yes How Accessible: Accessible via walker Home Equipment: Rolling Walker (2 wheels);Cane - single point;BSC/3in1;Shower seat;Wheelchair - manual;Adaptive equipment;Grab bars - tub/shower;Grab bars - toilet Adaptive Equipment: Reacher        Prior Functioning/Environment Prior Level of Function : Independent/Modified Independent             Mobility Comments: Typically walks without an AD, admits to frequent falls. ADLs Comments: Mod I with ADL, iADL and mobiltiy.  Patient is primary CG for spouse.  They have [facility] for therapy, RN and CNA for spouse.        OT Problem List: Decreased strength;Decreased activity tolerance;Impaired balance (sitting and/or standing);Pain      OT Treatment/Interventions: Self-care/ADL training;Therapeutic activities;Patient/family education;Balance training;DME and/or AE instruction    OT Goals(Current goals can be found in the care plan section) Acute Rehab OT Goals Patient Stated Goal: Return home OT Goal Formulation: With patient Time For Goal Achievement: 12/21/23 Potential to Achieve Goals: Good ADL Goals Pt Will Perform Grooming: with supervision;standing Pt Will Perform Lower Body Dressing: with supervision;sit to/from stand;with adaptive equipment Pt Will Transfer to Toilet: with modified independence;ambulating;regular height toilet  OT Frequency: Min 1X/week    Co-evaluation              AM-PAC OT 6 Clicks Daily Activity     Outcome Measure Help from another person eating meals?: None Help from another person taking care of personal grooming?: A Little Help from another  person toileting, which includes using toliet, bedpan, or urinal?: A Little Help from another person bathing (including washing, rinsing, drying)?: A Little Help from another person to put on and taking off regular upper body clothing?: None Help from another person to put on and taking off regular lower body clothing?: A Lot 6 Click Score: 19   End of Session Nurse Communication: Mobility status  Activity Tolerance: Patient tolerated treatment well Patient left: in bed;with call bell/phone within reach;with family/visitor present  OT Visit Diagnosis: Unsteadiness on feet (R26.81);Muscle weakness (generalized) (M62.81);Pain Pain - Right/Left: Left Pain - part of body: Hip                Time: 8771-8749 OT Time Calculation (min): 22 min Charges:  OT General Charges $OT Visit: 1 Visit OT Evaluation $OT Eval Moderate Complexity: 1 Mod  12/06/2023  RP, OTR/L  Acute Rehabilitation Services  Office:  [phone]   Charlie JONETTA Halsted 12/06/2023, 1:02 PM

## 2023-12-06 NOTE — Evaluation (Signed)
 Physical Therapy Evaluation Patient Details Name: Cynthia Gilmore MRN: [id number] DOB: [birth year] Today's Date: 12/06/2023  History of Present Illness  89 y.o. female who had a ground level fall at home. Past medical history:   Depression/anxiety  DM  GERD  Hemorrhoids  HTN  HLD  Hiatal hernia  IBS  Lymphocytic colitis  History of TIA.  S/p IM Nail.  Clinical Impression   Pt admitted with above diagnosis. Lives at home with her husband (she is his caregiver), in a single-level home with a ramped entrance; Prior to admission, pt was independent, although she does report recent falls; Presents to PT with a functional decline from her baseline, general weakness, L hip pain limiting mobility, functional dependencies; Overall needing min assist for bed mobiltiy, sit to stand transfers, and short distance amb with RW; Will aim at getting pt back home (daughters can assist her and her husband) with HHPT/OT follow up;  Pt currently with functional limitations due to the deficits listed below (see PT Problem List). Pt will benefit from skilled PT to increase their independence and safety with mobility to allow discharge to the venue listed below.           If plan is discharge home, recommend the following: A little help with walking and/or transfers;A little help with bathing/dressing/bathroom;Assistance with cooking/housework   Can travel by private vehicle        Equipment Recommendations None recommended by PT (pretty well-equipped)  Recommendations for Other Services       Functional Status Assessment Patient has had a recent decline in their functional status and demonstrates the ability to make significant improvements in function in a reasonable and predictable amount of time.     Precautions / Restrictions Precautions Precautions: Fall Restrictions Weight Bearing Restrictions Per Provider Order: Yes LLE Weight Bearing Per Provider Order: Weight bearing as tolerated      Mobility   Bed Mobility Overal bed mobility: Needs Assistance Bed Mobility: Supine to Sit, Sit to Supine     Supine to sit: Min assist Sit to supine: Min assist   General bed mobility comments: assist to advance L leg to get up; cues for technique; min assist to help LLE back into bed    Transfers Overall transfer level: Needs assistance Equipment used: Rolling walker (2 wheels) Transfers: Sit to/from Stand Sit to Stand: Min assist           General transfer comment: Cues for hand placement and safety    Ambulation/Gait Ambulation/Gait assistance: Contact guard assist Gait Distance (Feet): 18 Feet Assistive device: Rolling walker (2 wheels) Gait Pattern/deviations: Step-through pattern Gait velocity: slowed     General Gait Details: Cues for gait sequence, safe step length; very nice use of RW and UEs to unweigh painful LLE in stance  Stairs            Wheelchair Mobility     Tilt Bed    Modified Rankin (Stroke Patients Only)       Balance     Sitting balance-Leahy Scale: Fair       Standing balance-Leahy Scale: Poor                               Pertinent Vitals/Pain Pain Assessment Pain Assessment: Faces Faces Pain Scale: Hurts even more Pain Location: L hip Pain Descriptors / Indicators: Sore, Grimacing, Guarding Pain Intervention(s): Monitored during session, Premedicated before session    Home  Living Family/patient expects to be discharged to:: Private residence Living Arrangements: Spouse/significant other Available Help at Discharge: Family;Available 24 hours/day Type of Home: House Home Access: Ramped entrance       Home Layout: One level Home Equipment: Agricultural Consultant (2 wheels);Cane - single point;BSC/3in1;Shower seat;Wheelchair - manual;Adaptive equipment;Grab bars - tub/shower;Grab bars - toilet      Prior Function Prior Level of Function : Independent/Modified Independent             Mobility Comments: Typically  walks without an AD, admits to frequent falls. ADLs Comments: Mod I with ADL, iADL and mobiltiy.  Patient is primary CG for spouse.  They have [facility] for therapy, RN and CNA for spouse.     Extremity/Trunk Assessment   Upper Extremity Assessment Upper Extremity Assessment: Defer to OT evaluation    Lower Extremity Assessment Lower Extremity Assessment: LLE deficits/detail LLE Deficits / Details: Grossly decr strength post fx and surgical fixation; good quad activation; able to accept weight on LLE in stance    Cervical / Trunk Assessment Cervical / Trunk Assessment: Normal  Communication   Communication Communication: No apparent difficulties  Cognition Arousal: Alert Behavior During Therapy: WFL for tasks assessed/performed Overall Cognitive Status: Within Functional Limits for tasks assessed                                          General Comments General comments (skin integrity, edema, etc.): Daughter present and helpful    Exercises     Assessment/Plan    PT Assessment Patient needs continued PT services  PT Problem List Decreased strength;Decreased activity tolerance;Decreased balance;Decreased mobility;Decreased coordination;Decreased knowledge of use of DME;Decreased safety awareness;Decreased knowledge of precautions;Pain       PT Treatment Interventions DME instruction;Gait training;Stair training;Functional mobility training;Therapeutic activities;Therapeutic exercise;Balance training;Patient/family education    PT Goals (Current goals can be found in the Care Plan section)  Acute Rehab PT Goals Patient Stated Goal: Hopes to get home soon PT Goal Formulation: With patient Time For Goal Achievement: 12/20/23 Potential to Achieve Goals: Good    Frequency Min 1X/week     Co-evaluation               AM-PAC PT 6 Clicks Mobility  Outcome Measure Help needed turning from your back to your side while in a flat bed without using  bedrails?: A Little Help needed moving from lying on your back to sitting on the side of a flat bed without using bedrails?: A Little Help needed moving to and from a bed to a chair (including a wheelchair)?: A Little Help needed standing up from a chair using your arms (e.g., wheelchair or bedside chair)?: A Little Help needed to walk in hospital room?: A Little Help needed climbing 3-5 steps with a railing? : A Lot 6 Click Score: 17    End of Session Equipment Utilized During Treatment: Gait belt Activity Tolerance: Patient tolerated treatment well Patient left: in bed;with call bell/phone within reach;with family/visitor present Nurse Communication: Mobility status PT Visit Diagnosis: Unsteadiness on feet (R26.81);Pain Pain - Right/Left: Left Pain - part of body: Hip    Time: 8584-8545 PT Time Calculation (min) (ACUTE ONLY): 39 min   Charges:   PT Evaluation $PT Eval Moderate Complexity: 1 Mod PT Treatments $Gait Training: 8-22 mins $Therapeutic Activity: 8-22 mins PT General Charges $$ ACUTE PT VISIT: 1 Visit  Silvano Currier, PT  Acute Rehabilitation Services Office [phone] Secure Chat welcomed   Silvano VEAR Currier 12/06/2023, 3:46 PM

## 2023-12-06 NOTE — Progress Notes (Signed)
  Progress Note   Patient: Cynthia Gilmore FMW:[id number] DOB: Jan 31, 1934 DOA: 12/04/2023     2 DOS: the patient was seen and examined on 12/06/2023 at 11;05AM      Brief hospital course: 89 y.o. F with HTN, DM, anxiety, NSVT who presented with left hip fracture.     Assessment and Plan: * Femoral fracture (HCC) S/p intramedullary nailing 1/11 by Dr. Georgina - WBAT - PT/OT - SW   Anemia Expected post-op blood loss, Hgb down to 8 - Trend Hgb  Anxiety - Continue Cymbalta   Paroxysmal supraventricular tachycardia (HCC) Patient has no history of Afib, has had palpitations worked up by her Cardiologist and has NSVT treated with metoprolol . - Continue metoprolol   Type II diabetes mellitus with neurological manifestations (HCC) Glucose normal - Continue SS correction  - Hold metformin   Essential hypertension BP soft - Continue metoprolol  - Hold losartan , spironolactone  for now          Subjective: No new complaints, did well with PT, hoping to go home with [facility]     Physical Exam: BP (!) 109/56 (BP Location: Left Arm)   Pulse 80   Temp 98.4 F (36.9 C)   Resp 18   Ht 5' 5 (1.651 m)   Wt 63.5 kg   SpO2 95%   BMI 23.30 kg/m   Elderly adult female, sitting up in recliner, interactive and appropriate RRR, no murmurs, no peripheral edema Respiratory rate normal, lungs clear without rales or wheezes Abdomen soft without tenderness palpation or guarding, no ascites or distention Attention normal, affect normal, judgment insight appear normal  Data Reviewed: CBC shows hemoglobin slightly down to 8 Basic metabolic panel unremarkable  Family Communication: None present    Disposition: Status is: Inpatient Patient would like to explore going home with home health        Author: Lonni SHAUNNA Dalton, MD 12/06/2023 5:16 PM  For on call review [email or web address].

## 2023-12-07 ENCOUNTER — Encounter (HOSPITAL_COMMUNITY): Payer: Self-pay | Admitting: Orthopedic Surgery

## 2023-12-07 DIAGNOSIS — F419 Anxiety disorder, unspecified: Secondary | ICD-10-CM | POA: Diagnosis not present

## 2023-12-07 DIAGNOSIS — I471 Supraventricular tachycardia, unspecified: Secondary | ICD-10-CM | POA: Diagnosis not present

## 2023-12-07 DIAGNOSIS — I10 Essential (primary) hypertension: Secondary | ICD-10-CM | POA: Diagnosis not present

## 2023-12-07 DIAGNOSIS — S72102A Unspecified trochanteric fracture of left femur, initial encounter for closed fracture: Secondary | ICD-10-CM | POA: Diagnosis not present

## 2023-12-07 LAB — GLUCOSE, CAPILLARY
Glucose-Capillary: 129 mg/dL — ABNORMAL HIGH (ref 70–99)
Glucose-Capillary: 143 mg/dL — ABNORMAL HIGH (ref 70–99)
Glucose-Capillary: 98 mg/dL (ref 70–99)
Glucose-Capillary: 112 mg/dL — ABNORMAL HIGH (ref 70–99)
Glucose-Capillary: 128 mg/dL — ABNORMAL HIGH (ref 70–99)

## 2023-12-07 LAB — CBC
HCT: 24.5 % — ABNORMAL LOW (ref 36.0–46.0)
Hemoglobin: 8.3 g/dL — ABNORMAL LOW (ref 12.0–15.0)
MCH: 29.9 pg (ref 26.0–34.0)
MCHC: 33.9 g/dL (ref 30.0–36.0)
MCV: 88.1 fL (ref 80.0–100.0)
Platelets: 177 10*3/uL (ref 150–400)
RBC: 2.78 MIL/uL — ABNORMAL LOW (ref 3.87–5.11)
RDW: 13.2 % (ref 11.5–15.5)
WBC: 8.2 10*3/uL (ref 4.0–10.5)
nRBC: 0 % (ref 0.0–0.2)

## 2023-12-07 LAB — BASIC METABOLIC PANEL
Anion gap: 10 (ref 5–15)
BUN: 14 mg/dL (ref 8–23)
CO2: 24 mmol/L (ref 22–32)
Calcium: 8.4 mg/dL — ABNORMAL LOW (ref 8.9–10.3)
Chloride: 102 mmol/L (ref 98–111)
Creatinine, Ser: 0.63 mg/dL (ref 0.44–1.00)
GFR, Estimated: >60 mL/min (ref >60)
Glucose, Bld: 120 mg/dL — ABNORMAL HIGH (ref 70–99)
Potassium: 3.9 mmol/L (ref 3.5–5.1)
Sodium: 136 mmol/L (ref 135–145)

## 2023-12-07 MED ORDER — SENNOSIDES-DOCUSATE SODIUM 8.6-50 MG PO TABS: 1.0000 | ORAL_TABLET | Freq: Two times a day (BID) | ORAL | 0 refills | Status: DC

## 2023-12-07 MED ORDER — OXYCODONE HCL 5 MG PO TABS: 2.5000 mg | ORAL_TABLET | ORAL | 0 refills | Status: DC | PRN

## 2023-12-07 MED ORDER — BISACODYL 10 MG RE SUPP: 10.0000 mg | Freq: Every day | RECTAL | Status: DC | PRN

## 2023-12-07 MED ORDER — POLYETHYLENE GLYCOL 3350 17 G PO PACK: 17.0000 g | PACK | Freq: Every day | ORAL | 0 refills | Status: DC

## 2023-12-07 MED ORDER — ESCITALOPRAM OXALATE 20 MG PO TABS
20.0000 mg | ORAL_TABLET | Freq: Every day | ORAL | Status: DC
  Administered 2023-12-08 – 2023-12-09 (×2): 20 mg via ORAL
  Filled 2023-12-07 (×2): qty 1

## 2023-12-07 MED ORDER — SPIRONOLACTONE 25 MG PO TABS
37.5000 mg | ORAL_TABLET | Freq: Every day | ORAL | Status: DC
  Administered 2023-12-07 – 2023-12-09 (×3): 37.5 mg via ORAL
  Filled 2023-12-07 (×3): qty 1

## 2023-12-07 MED ORDER — LOSARTAN POTASSIUM 50 MG PO TABS
100.0000 mg | ORAL_TABLET | Freq: Every day | ORAL | Status: DC
  Administered 2023-12-07 – 2023-12-09 (×3): 100 mg via ORAL
  Filled 2023-12-07 (×3): qty 2

## 2023-12-07 MED ORDER — ASPIRIN 81 MG PO CHEW: 81.0000 mg | CHEWABLE_TABLET | Freq: Two times a day (BID) | ORAL | 0 refills | Status: AC

## 2023-12-07 MED ORDER — ALPRAZOLAM 0.25 MG PO TABS
0.2500 mg | ORAL_TABLET | Freq: Two times a day (BID) | ORAL | Status: DC | PRN
  Administered 2023-12-07 – 2023-12-08 (×3): 0.25 mg via ORAL
  Filled 2023-12-07 (×3): qty 1

## 2023-12-07 MED ORDER — ACETAMINOPHEN 500 MG PO TABS: 1000.0000 mg | ORAL_TABLET | Freq: Three times a day (TID) | ORAL | 0 refills | Status: AC

## 2023-12-07 NOTE — TOC Initial Note (Addendum)
 Transition of Care (TOC) - Initial/Assessment Note   Spoke to patent at bedside and daughter Cynthia Gilmore via speaker phone.   Patient from home with husband. Husband has home health services through [facility] and would like home health services with [facility] also.   NCM called Lynette with [facility] . Unfortunately they do not have PT staffing in [place] for a week. Anticipated discharge date is 12/08/23 . She cannot accept referral.   Explained above to patient and Cynthia Gilmore . They voiced understanding . NCM will start calling other agencies in network with patient's insurance .   Patient has DME at home.  Patient and Cynthia Gilmore voiced understanding   Darleene with [facility] accepted referral for HHPT . WIll secure chat MD for orders  Patient Details  Name: Cynthia Gilmore MRN: [id number] Date of Birth: 1934/02/13  Transition of Care [facility]) CM/SW Contact:    Stephane Powell Jansky, RN Phone Number: 12/07/2023, 10:30 AM  Clinical Narrative:                   Expected Discharge Plan: Home w Home Health Services Barriers to Discharge: Continued Medical Work up   Patient Goals and CMS Choice Patient states their goals for this hospitalization and ongoing recovery are:: to return to home CMS Medicare.gov Compare Post Acute Care list provided to:: Patient Choice offered to / list presented to : Patient      Expected Discharge Plan and Services   Discharge Planning Services: CM Consult Post Acute Care Choice: Home Health Living arrangements for the past 2 months: Single Family Home                 DME Arranged: N/A         HH Arranged: PT (see note)          Prior Living Arrangements/Services Living arrangements for the past 2 months: Single Family Home Lives with:: Spouse Patient language and need for interpreter reviewed:: Yes Do you feel safe going back to the place where you live?: Yes      Need for Family Participation in Patient Care: Yes (Comment) Care giver support system in  place?: Yes (comment) Current home services: DME Criminal Activity/Legal Involvement Pertinent to Current Situation/Hospitalization: No - Comment as needed  Activities of Daily Living   ADL Screening (condition at time of admission) Independently performs ADLs?: Yes (appropriate for developmental age) Is the patient deaf or have difficulty hearing?: No Does the patient have difficulty seeing, even when wearing glasses/contacts?: No Does the patient have difficulty concentrating, remembering, or making decisions?: No  Permission Sought/Granted Permission sought to share information with : Family Supports Permission granted to share information with : Yes, Verbal Permission Granted  Share Information with NAME: daughter Cynthia Gilmore [place] [phone]  Permission granted to share info w AGENCY: [facility], other home health agencies        Emotional Assessment Appearance:: Appears stated age Attitude/Demeanor/Rapport: Engaged Affect (typically observed): Accepting Orientation: : Oriented to Self, Oriented to Place, Oriented to  Time, Oriented to Situation Alcohol / Substance Use: Not Applicable Psych Involvement: No (comment)  Admission diagnosis:  Femoral fracture (HCC) [S72.90XA] Closed displaced intertrochanteric fracture of left femur, initial encounter (HCC) [S72.142A] Fall, initial encounter [W19.XXXA] Patient Active Problem List   Diagnosis Date Noted   Anxiety 12/05/2023   Femoral fracture (HCC) 12/04/2023   Subvalvular aortic stenosis 03/02/2017   Low back pain radiating to both legs 06/06/2016   Sinusitis, acute 11/27/2015   Numerous medication allergies  and sensitivities 07/20/2015   Carpal tunnel syndrome 05/30/2015   Dysuria 05/30/2015   Preventative health care 10/13/2014   Paroxysmal supraventricular tachycardia (HCC) 11/03/2013   Palpitation 07/07/2012   Lipoma stomach 06/03/2012   Intermittent vertigo 10/08/2011   GERD (gastroesophageal reflux disease) 06/20/2011    IBS (irritable bowel syndrome) 06/20/2011   DEPRESSION/ANXIETY 11/11/2010   Type II diabetes mellitus with neurological manifestations (HCC) 01/31/2010   ALLERGIC RHINITIS 09/13/2009   INSOMNIA, CHRONIC 05/24/2009   MEMORY LOSS 05/24/2009   ABDOMINAL PAIN-EPIGASTRIC 01/09/2009   Lymphocytic colitis 07/19/2008   History of colonic polyps 07/19/2008   Essential hypertension 01/30/2007   DEGENERATIVE DISC DISEASE 01/30/2007   CARDIAC MURMUR, AORTIC 01/30/2007   PCP:  Vernon Velna SAUNDERS, MD Pharmacy:   [id number] [place], [place] - [address] AT [facility] OF [facility] & [address] [place] [address] Phone: [phone] Fax: [phone]     Social Drivers of Health (SDOH) Social History: SDOH Screenings   Food Insecurity: No Food Insecurity (12/05/2023)  Housing: Low Risk  (12/05/2023)  Transportation Needs: No Transportation Needs (12/05/2023)  Utilities: Not At Risk (12/05/2023)  Social Connections: Moderately Integrated (12/05/2023)  Tobacco Use: Medium Risk (12/05/2023)   SDOH Interventions:     Readmission Risk Interventions     No data to display

## 2023-12-07 NOTE — Plan of Care (Signed)

## 2023-12-07 NOTE — Progress Notes (Signed)
 Physical Therapy Treatment Patient Details Name: Cynthia Gilmore MRN: [id number] DOB: 08-Dec-1933 Today's Date: 12/07/2023   History of Present Illness 89 y.o. female who had a ground level fall at home resulting in left femoral neck fx s/p  IM Nail 1/11  PMH includes: Depression/anxiety  DM  GERD  Hemorrhoids  HTN  HLD  Hiatal hernia  IBS  Lymphocytic colitis  History of TIA.    PT Comments  Patient is progressing well and willing to participate with therapy today, as evident by her mobility. The patient required CGA +1 during ambulation and was able to walk 16ft in the unit with her RW. The patient demonstrated a step-to pattern initially, followed by a step-through pattern before expressing fatigue. The patient required Min A +1 for transfers (STS) and bed mobility. She did a great job of utilizing her gait belt and unimpaired foot to help assist during bed transfers. The patient will continue to benefit from acute PT in order to meet her desired independence related to mobility and transfers.    If plan is discharge home, recommend the following: A little help with walking and/or transfers;A little help with bathing/dressing/bathroom;Assistance with cooking/housework   Can travel by Training And Development Officer (2 wheels)    Recommendations for Other Services       Precautions / Restrictions Precautions Precautions: Fall Restrictions Weight Bearing Restrictions Per Provider Order: Yes LLE Weight Bearing Per Provider Order: Weight bearing as tolerated     Mobility  Bed Mobility Overal bed mobility: Needs Assistance Bed Mobility: Supine to Sit, Sit to Supine     Supine to sit: Min assist Sit to supine: Min assist   General bed mobility comments: assist to advance L leg to get up; cues for technique; min assist to help LLE back into bed    Transfers Overall transfer level: Needs assistance Equipment used: Rolling walker (2  wheels) Transfers: Sit to/from Stand Sit to Stand: Min assist           General transfer comment: Cues for hand placement and safety    Ambulation/Gait Ambulation/Gait assistance: Contact guard assist Gait Distance (Feet): 100 Feet Assistive device: Rolling walker (2 wheels) Gait Pattern/deviations: Step-to pattern, Step-through pattern (Was able to perform step-through pattern, regressed to step-to pattern due to fatigue)       General Gait Details: Cues for gait sequence, safe step length; very nice use of RW and UEs to unweigh painful LLE in stance   Stairs             Wheelchair Mobility     Tilt Bed    Modified Rankin (Stroke Patients Only)       Balance Overall balance assessment: Needs assistance Sitting-balance support: Feet supported Sitting balance-Leahy Scale: Fair     Standing balance support: Reliant on assistive device for balance Standing balance-Leahy Scale: Poor                              Cognition Arousal: Alert Behavior During Therapy: WFL for tasks assessed/performed Overall Cognitive Status: Within Functional Limits for tasks assessed                                          Exercises      General Comments  Pertinent Vitals/Pain Pain Assessment Pain Assessment: No/denies pain    Home Living                          Prior Function            PT Goals (current goals can now be found in the care plan section) Acute Rehab PT Goals PT Goal Formulation: With patient Time For Goal Achievement: 12/20/23 Potential to Achieve Goals: Good Progress towards PT goals: Progressing toward goals    Frequency    Min 1X/week      PT Plan      Co-evaluation              AM-PAC PT 6 Clicks Mobility   Outcome Measure  Help needed turning from your back to your side while in a flat bed without using bedrails?: A Little Help needed moving from lying on your back to  sitting on the side of a flat bed without using bedrails?: A Little Help needed moving to and from a bed to a chair (including a wheelchair)?: A Little Help needed standing up from a chair using your arms (e.g., wheelchair or bedside chair)?: A Little Help needed to walk in hospital room?: A Little Help needed climbing 3-5 steps with a railing? : A Lot 6 Click Score: 17    End of Session Equipment Utilized During Treatment: Gait belt Activity Tolerance: Patient tolerated treatment well Patient left: in bed;with call bell/phone within reach;with family/visitor present Nurse Communication: Mobility status PT Visit Diagnosis: Unsteadiness on feet (R26.81);Pain Pain - Right/Left: Left Pain - part of body: Hip     Time: 1338-1400 PT Time Calculation (min) (ACUTE ONLY): 22 min  Charges:    $Gait Training: 8-22 mins PT General Charges $$ ACUTE PT VISIT: 1 Visit                     Deanie DOROTHA Harms, SPT   Angeldejesus Callaham 12/07/2023, 4:30 PM

## 2023-12-07 NOTE — Progress Notes (Signed)
  Progress Note   Patient: Cynthia Gilmore FMW:[id number] DOB: [birth year] DOA: 12/04/2023     3 DOS: the patient was seen and examined on 12/07/2023 at 8:55AM      Brief hospital course: 89 y.o. F with HTN, DM, anxiety, NSVT who presented with left hip fracture.     Assessment and Plan: * Femoral fracture (HCC) S/p intramedullary nailing 1/11 by Dr. Georgina - WBAT - PT/OT - SW    Anxiety - Continue Cymbalta , Xanax  - Resume Lexapro   Paroxysmal supraventricular tachycardia (HCC) Patient has no history of Afib, has had palpitations worked up by her Cardiologist and has NSVT treated with metoprolol . - Continue metoprolol   Type II diabetes mellitus with neurological manifestations (HCC) Glucose normal - Continue SS corrections - Hold metformin   Essential hypertension BP elevated - Contnue metoprolol   - Resume losartan , spironolactone            Subjective: Patient is doing well, she has some leg soreness, but is ambulating with physical therapy.     Physical Exam: BP (!) 156/69 (BP Location: Right Arm)   Pulse 91   Temp 97.6 F (36.4 C) (Oral)   Resp 17   Ht 5' 5 (1.651 m)   Wt 63.5 kg   SpO2 99%   BMI 23.30 kg/m   Elderly adult female, lying in bed, interactive and appropriate RRR, no murmurs, no peripheral edema Respiratory rate normal, lungs clear without rales or wheezes Abdomen soft no tenderness palpation or guarding, no ascites or distention Attention normal, affect normal, judgment insight appear normal, upper extremities symmetric     Data Reviewed: CBC shows hemoglobin 8.3, no change Basic metabolic panel normal  Family Communication: Daughter by phone    Disposition: Status is: Inpatient Patient able to go home with home health, hopefully if everything can be arranged tomorrow afternoon if Hgb and Cr stable        Author: Lonni SHAUNNA Dalton, MD 12/07/2023 2:06 PM  For on call review [email or web address].

## 2023-12-07 NOTE — Anesthesia Postprocedure Evaluation (Signed)
 Anesthesia Post Note  Patient: Cynthia Gilmore  Procedure(s) Performed: INTRAMEDULLARY (IM) NAIL INTERTROCHANTERIC (Left: Hip)     Patient location during evaluation: PACU Anesthesia Type: General Level of consciousness: awake and alert Pain management: pain level controlled Vital Signs Assessment: post-procedure vital signs reviewed and stable Respiratory status: spontaneous breathing, nonlabored ventilation, respiratory function stable and patient connected to nasal cannula oxygen Cardiovascular status: blood pressure returned to baseline and stable Postop Assessment: no apparent nausea or vomiting Anesthetic complications: no   No notable events documented.  Last Vitals:  Vitals:   12/07/23 0446 12/07/23 0834  BP: (!) 115/52 (!) 156/69  Pulse: 72 91  Resp:  17  Temp: 36.7 C 36.4 C  SpO2: 96% 99%    Last Pain:  Vitals:   12/07/23 0834  TempSrc: Oral  PainSc:                  [name]

## 2023-12-07 NOTE — Progress Notes (Signed)
 Orthopedic Surgery Progress Note   Assessment: Patient is a 89 y.o. female with left intertrochanteric femur fracture status post CMN   Plan: -Operative plans: complete -Diet: regular -DVT ppx: aspirin  81mg  BID -Antibiotics: ancef  x2 post-op doses -Weight bearing status: as tolerated -PT evaluate and treat -Pain control -Dispo: remain floor status ___________________________________________________________________________  Subjective: No acute events overnight. Pain better today versus yesterday. Feels that PT went well yesterday. Has ambulated to the bathroom this morning. Denies paresthesias and numbness.    Physical Exam:  General: no acute distress, appears stated age Neurologic: alert, answering questions appropriately, following commands Respiratory: unlabored breathing on room air, symmetric chest rise Psychiatric: appropriate affect, normal cadence to speech  MSK:   -Left lower extremity  Dressing over hip c/d/i EHL/TA/GSC intact Plantarflexes and dorsiflexes toes Sensation intact to light touch in sural, saphenous, tibial, deep peroneal, and superficial peroneal nerve distributions Foot warm and well perfused, palpable DP pulse   Patient name: Cynthia Gilmore Patient MRN: [id number] Date: 12/07/23

## 2023-12-07 NOTE — Plan of Care (Signed)
  Problem: Education: Goal: Ability to describe self-care measures that may prevent or decrease complications (Diabetes Survival Skills Education) will improve Outcome: Progressing Goal: Individualized Educational Video(s) Outcome: Progressing   Problem: Coping: Goal: Ability to adjust to condition or change in health will improve Outcome: Progressing   Problem: Fluid Volume: Goal: Ability to maintain a balanced intake and output will improve Outcome: Progressing   Problem: Health Behavior/Discharge Planning: Goal: Ability to identify and utilize available resources and services will improve Outcome: Progressing Goal: Ability to manage health-related needs will improve Outcome: Progressing   Problem: Metabolic: Goal: Ability to maintain appropriate glucose levels will improve Outcome: Progressing   Problem: Nutritional: Goal: Maintenance of adequate nutrition will improve Outcome: Progressing Goal: Progress toward achieving an optimal weight will improve Outcome: Progressing   Problem: Skin Integrity: Goal: Risk for impaired skin integrity will decrease Outcome: Progressing   Problem: Tissue Perfusion: Goal: Adequacy of tissue perfusion will improve Outcome: Progressing   Problem: Education: Goal: Knowledge of General Education information will improve Description: Including pain rating scale, medication(s)/side effects and non-pharmacologic comfort measures Outcome: Progressing   Problem: Health Behavior/Discharge Planning: Goal: Ability to manage health-related needs will improve Outcome: Progressing

## 2023-12-08 DIAGNOSIS — I471 Supraventricular tachycardia, unspecified: Secondary | ICD-10-CM | POA: Diagnosis not present

## 2023-12-08 DIAGNOSIS — F419 Anxiety disorder, unspecified: Secondary | ICD-10-CM | POA: Diagnosis not present

## 2023-12-08 DIAGNOSIS — I10 Essential (primary) hypertension: Secondary | ICD-10-CM | POA: Diagnosis not present

## 2023-12-08 DIAGNOSIS — S72102A Unspecified trochanteric fracture of left femur, initial encounter for closed fracture: Secondary | ICD-10-CM | POA: Diagnosis not present

## 2023-12-08 LAB — BASIC METABOLIC PANEL
Anion gap: 8 (ref 5–15)
BUN: 14 mg/dL (ref 8–23)
CO2: 26 mmol/L (ref 22–32)
Calcium: 8.5 mg/dL — ABNORMAL LOW (ref 8.9–10.3)
Chloride: 104 mmol/L (ref 98–111)
Creatinine, Ser: 0.61 mg/dL (ref 0.44–1.00)
GFR, Estimated: >60 mL/min (ref >60)
Glucose, Bld: 109 mg/dL — ABNORMAL HIGH (ref 70–99)
Potassium: 4.3 mmol/L (ref 3.5–5.1)
Sodium: 138 mmol/L (ref 135–145)

## 2023-12-08 LAB — CBC
HCT: 23.3 % — ABNORMAL LOW (ref 36.0–46.0)
Hemoglobin: 7.7 g/dL — ABNORMAL LOW (ref 12.0–15.0)
MCH: 29.5 pg (ref 26.0–34.0)
MCHC: 33 g/dL (ref 30.0–36.0)
MCV: 89.3 fL (ref 80.0–100.0)
Platelets: 232 10*3/uL (ref 150–400)
RBC: 2.61 MIL/uL — ABNORMAL LOW (ref 3.87–5.11)
RDW: 13.3 % (ref 11.5–15.5)
WBC: 6.3 10*3/uL (ref 4.0–10.5)
nRBC: 0 % (ref 0.0–0.2)

## 2023-12-08 LAB — GLUCOSE, CAPILLARY
Glucose-Capillary: 118 mg/dL — ABNORMAL HIGH (ref 70–99)
Glucose-Capillary: 93 mg/dL (ref 70–99)
Glucose-Capillary: 98 mg/dL (ref 70–99)
Glucose-Capillary: 118 mg/dL — ABNORMAL HIGH (ref 70–99)

## 2023-12-08 NOTE — Progress Notes (Signed)
 Occupational Therapy Treatment Patient Details Name: Cynthia Gilmore MRN: [id number] DOB: 07-07-1934 Today's Date: 12/08/2023   History of present illness 89 y.o. female who had a ground level fall at home resulting in left femoral neck fx s/p  IM Nail 1/11  PMH includes: Depression/anxiety  DM  GERD  Hemorrhoids  HTN  HLD  Hiatal hernia  IBS  Lymphocytic colitis  History of TIA.   OT comments  Patient demonstrating good gains with OT treatment. Patient able to ambulate to bathroom and perform grooming tasks standing at sink with CGA. Patient performed mobility in room to simulate home environment and toilet transfers with CGA. Patient educated on reacher and sock aide use for LB dressing and was able to return demonstration with min assist to complete. Discharge recommendations continue to be appropriate for HHOT to continue to address LB ADLs and functional transfers.       If plan is discharge home, recommend the following:  Assist for transportation;Assistance with cooking/housework;A little help with walking and/or transfers;A little help with bathing/dressing/bathroom   Equipment Recommendations  None recommended by OT    Recommendations for Other Services      Precautions / Restrictions Precautions Precautions: Fall Restrictions Weight Bearing Restrictions Per Provider Order: Yes LLE Weight Bearing Per Provider Order: Weight bearing as tolerated       Mobility Bed Mobility Overal bed mobility: Needs Assistance             General bed mobility comments: OOB in recliner    Transfers Overall transfer level: Needs assistance Equipment used: Rolling walker (2 wheels) Transfers: Sit to/from Stand, Bed to chair/wheelchair/BSC Sit to Stand: Contact guard assist     Step pivot transfers: Contact guard assist     General transfer comment: cues for hand placement and CGA     Balance Overall balance assessment: Needs assistance Sitting-balance support: Feet  supported Sitting balance-Leahy Scale: Fair     Standing balance support: Single extremity supported, Bilateral upper extremity supported, During functional activity Standing balance-Leahy Scale: Poor Standing balance comment: reliant on UE support when standing                           ADL either performed or assessed with clinical judgement   ADL Overall ADL's : Needs assistance/impaired     Grooming: Wash/dry hands;Wash/dry face;Brushing hair;Contact guard assist;Standing Grooming Details (indicate cue type and reason): at sink             Lower Body Dressing: Minimal assistance;With adaptive equipment;Sit to/from stand Lower Body Dressing Details (indicate cue type and reason): AE training with reacher and sock aide with min assist to donn socks and pants Toilet Transfer: Contact guard assist;Ambulation;Rolling walker (2 wheels)             General ADL Comments: demonstrated good understanding of AE use    Extremity/Trunk Assessment              Vision       Perception     Praxis      Cognition Arousal: Alert Behavior During Therapy: WFL for tasks assessed/performed Overall Cognitive Status: Within Functional Limits for tasks assessed                                          Exercises      Shoulder Instructions  General Comments      Pertinent Vitals/ Pain       Pain Assessment Pain Assessment: No/denies pain Pain Intervention(s): Monitored during session  Home Living                                          Prior Functioning/Environment              Frequency  Min 1X/week        Progress Toward Goals  OT Goals(current goals can now be found in the care plan section)  Progress towards OT goals: Progressing toward goals  Acute Rehab OT Goals Patient Stated Goal: go home OT Goal Formulation: With patient Time For Goal Achievement: 12/21/23 Potential to Achieve Goals:  Good ADL Goals Pt Will Perform Grooming: with supervision;standing Pt Will Perform Lower Body Dressing: with supervision;sit to/from stand;with adaptive equipment Pt Will Transfer to Toilet: with modified independence;ambulating;regular height toilet  Plan      Co-evaluation                 AM-PAC OT 6 Clicks Daily Activity     Outcome Measure   Help from another person eating meals?: None Help from another person taking care of personal grooming?: A Little Help from another person toileting, which includes using toliet, bedpan, or urinal?: A Little Help from another person bathing (including washing, rinsing, drying)?: A Little Help from another person to put on and taking off regular upper body clothing?: None Help from another person to put on and taking off regular lower body clothing?: A Little 6 Click Score: 20    End of Session Equipment Utilized During Treatment: Rolling walker (2 wheels);Other (comment) (reacher and sock aide)  OT Visit Diagnosis: Unsteadiness on feet (R26.81);Muscle weakness (generalized) (M62.81);Pain   Activity Tolerance Patient tolerated treatment well   Patient Left in chair;with call bell/phone within reach   Nurse Communication Mobility status        Time: 1117-1140 OT Time Calculation (min): 23 min  Charges: OT General Charges $OT Visit: 1 Visit OT Treatments $Self Care/Home Management : 23-37 mins  Dick Laine, OTA Acute Rehabilitation Services  Office [phone]   Jeb LITTIE Laine 12/08/2023, 1:19 PM

## 2023-12-08 NOTE — Progress Notes (Signed)
  Progress Note   Patient: Cynthia Gilmore FMW:[id number] DOB: 1934/06/07 DOA: 12/04/2023     4 DOS: the patient was seen and examined on 12/08/2023 at 9:20AM      Brief hospital course: 89 y.o. F with HTN, DM, anxiety, NSVT who presented with left hip fracture.  Post-op course notable for worsening anemia.     Assessment and Plan: * Femoral fracture (HCC) S/p intramedullary nailing 1/11 by Dr. Georgina - WBAT - PT/OT - SW   Anemia due to expected postoperative blood loss Baseline hemoglobin is around 10.  Postoperative hemoglobin is trending down to 7.7 today.  She felt quite dyspneic walking 50 feet with nursing yesterday.  Appreciated hematoma, however recommend monitoring 1 further day to make sure she does not need a transfusion tomorrow - Trend hemoglobin - Start oral iron  Anxiety - Continue Lexapro , Cymbalta , Xanax   Paroxysmal supraventricular tachycardia (HCC) Patient has no history of Afib, has had palpitations worked up by her Cardiologist and has NSVT treated with metoprolol . - Continue metoprolol   Type II diabetes mellitus with neurological manifestations (HCC) Glucose okay - Continue SS corrections - Hold metformin   Essential hypertension BP controlled - Continue spironolactone , losartan , metoprolol           Subjective: Patient is feeling well, she was somewhat weak and tired yesterday, she is very constipated.  No dizziness, no chest pain, no focal weakness.     Physical Exam: BP 122/61 (BP Location: Left Arm)   Pulse 95   Temp 97.9 F (36.6 C) (Oral)   Resp 17   Ht 5' 5 (1.651 m)   Wt 63.5 kg   SpO2 100%   BMI 23.30 kg/m   Thin adult female, ambulating back from the toilet already with a walker and CGA only. RRR, no murmurs, no peripheral edema Respiratory rate normal, lungs clear without rales or wheezes No bruising over her left hip incision Attention normal, affect appropriate, judgment and insight appear normal    Data  Reviewed: Creatinine stable basic metabolic panel CBC shows hemoglobin down to 7.7    Family Communication: Daughter by phone    Disposition: Status is: Inpatient Patient was admitted for hip fracture and underwent repair  She is doing well, and will actually be able to discharge home with home health  However hemoglobin is down to 7.7 today, and at this rate she may need transfusion tomorrow.  Monitor hemoglobin tomorrow, if stable, likely discharge home  with [facility]        Author: Lonni SHAUNNA Dalton, MD 12/08/2023 9:35 AM  For on call review [email or web address]

## 2023-12-08 NOTE — TOC Progression Note (Signed)
 Transition of Care [facility]) - Progression Note   Updated [facility] with [facility]  Patient Details  Name: Cynthia Gilmore MRN: [id number] Date of Birth: 1934-01-08  Transition of Care [facility]) CM/SW Contact  Hallelujah Wysong, Powell Jansky, RN Phone Number: 12/08/2023, 9:40 AM  Clinical Narrative:       Expected Discharge Plan: Home w Home Health Services Barriers to Discharge: Continued Medical Work up  Expected Discharge Plan and Services   Discharge Planning Services: CM Consult Post Acute Care Choice: Home Health Living arrangements for the past 2 months: Single Family Home                 DME Arranged: N/A         HH Arranged: PT (see note)           Social Determinants of Health (SDOH) Interventions SDOH Screenings   Food Insecurity: No Food Insecurity (12/05/2023)  Housing: Low Risk  (12/05/2023)  Transportation Needs: No Transportation Needs (12/05/2023)  Utilities: Not At Risk (12/05/2023)  Social Connections: Moderately Integrated (12/05/2023)  Tobacco Use: Medium Risk (12/05/2023)    Readmission Risk Interventions     No data to display

## 2023-12-08 NOTE — Plan of Care (Signed)

## 2023-12-08 NOTE — Progress Notes (Signed)
 Orthopedic Surgery Progress Note   Assessment: Patient is a 89 y.o. female with left intertrochanteric femur fracture status post CMN   Plan: -Operative plans: complete -Diet: regular -DVT ppx: aspirin  81mg  BID -Antibiotics: ancef  x2 post-op doses -Weight bearing status: as tolerated -PT evaluate and treat -Pain control -Dispo: per primary ___________________________________________________________________________  Subjective: No acute events overnight. Walked the halls yesterday with PT. Pain in her hip has been improving. No complaints this morning.    Physical Exam:  General: no acute distress, appears stated age Neurologic: alert, answering questions appropriately, following commands Respiratory: unlabored breathing on room air, symmetric chest rise Psychiatric: appropriate affect, normal cadence to speech  MSK:   -Left lower extremity  Dressing over hip c/d/i EHL/TA/GSC intact Plantarflexes and dorsiflexes toes Sensation intact to light touch in sural, saphenous, tibial, deep peroneal, and superficial peroneal nerve distributions Foot warm and well perfused, palpable DP pulse   Patient name: Cynthia Gilmore Patient MRN: [id number] Date: 12/08/23

## 2023-12-08 NOTE — Progress Notes (Addendum)
 Physical Therapy Treatment Patient Details Name: Cynthia Gilmore MRN: [id number] DOB: 01/07/1934 Today's Date: 12/08/2023   History of Present Illness 89 y.o. female who had a ground level fall at home resulting in left femoral neck fx s/p  IM Nail 1/11  PMH includes: Depression/anxiety  DM  GERD  Hemorrhoids  HTN  HLD  Hiatal hernia  IBS  Lymphocytic colitis  History of TIA.    PT Comments  Patient was eager and willing to participate with therapy today. The patient is able to perform transfers Mod I +1, and still requires supervision with a RW for ambulation to ensure safety. The patient was able to ambulate 114ft in the unit, walked to the bathroom with her RW, and is making good progress towards her goals. Introduced stair climbing and the patient required Mod A +2  to ensure safety, especially when descending stairs. Recommend follow up with HHPT to maximize functional level of function of independence.    If plan is discharge home, recommend the following: Assistance with cooking/housework   Can travel by Training And Development Officer (2 wheels)    Recommendations for Other Services OT consult     Precautions / Restrictions Precautions Precautions: Fall Restrictions Weight Bearing Restrictions Per Provider Order: Yes LLE Weight Bearing Per Provider Order: Weight bearing as tolerated     Mobility  Bed Mobility Overal bed mobility: Modified Independent Bed Mobility: Supine to Sit     Supine to sit: Modified independent (Device/Increase time)     General bed mobility comments: Utilized UE support to sit at the EOB, uses hand rail, elevated HOB    Transfers Overall transfer level: Modified independent Equipment used: Rolling walker (2 wheels) Transfers: Sit to/from Stand Sit to Stand: Modified independent (Device/Increase time)           General transfer comment: Verbal Cues for hand placement when sitting > standin,. Uses hand  rails    Ambulation/Gait Ambulation/Gait assistance: Supervision (For safety\) Gait Distance (Feet): 100 Feet Assistive device: Rolling walker (2 wheels) Gait Pattern/deviations: Step-through pattern           Stairs Stairs: Yes Stairs assistance: +2 safety/equipment, Mod assist Stair Management: One rail Right, Step to pattern, Forwards Number of Stairs: 3 General stair comments: Patient was fearful of falling when descending  and required Mod A +2 for safety, ascending was performed well   Wheelchair Mobility     Tilt Bed    Modified Rankin (Stroke Patients Only)       Balance Overall balance assessment: Modified Independent Sitting-balance support: Feet supported Sitting balance-Leahy Scale: Good     Standing balance support: Bilateral upper extremity supported Standing balance-Leahy Scale: Good                              Cognition Arousal: Alert Behavior During Therapy: WFL for tasks assessed/performed Overall Cognitive Status: Within Functional Limits for tasks assessed                                          Exercises      General Comments        Pertinent Vitals/Pain Pain Assessment Pain Assessment: No/denies pain Pain Intervention(s): Monitored during session    Home Living  Prior Function            PT Goals (current goals can now be found in the care plan section) Acute Rehab PT Goals Patient Stated Goal: Hopes to get home soon PT Goal Formulation: With patient Time For Goal Achievement: 12/20/23 Potential to Achieve Goals: Good Progress towards PT goals: Progressing toward goals    Frequency    Min 1X/week      PT Plan      Co-evaluation              AM-PAC PT 6 Clicks Mobility   Outcome Measure  Help needed turning from your back to your side while in a flat bed without using bedrails?: None Help needed moving from lying on your back to  sitting on the side of a flat bed without using bedrails?: None Help needed moving to and from a bed to a chair (including a wheelchair)?: None Help needed standing up from a chair using your arms (e.g., wheelchair or bedside chair)?: A Little Help needed to walk in hospital room?: A Little Help needed climbing 3-5 steps with a railing? : A Lot 6 Click Score: 20    End of Session Equipment Utilized During Treatment: Gait belt Activity Tolerance: Patient tolerated treatment well Patient left: in chair;with call bell/phone within reach;with chair alarm set Nurse Communication: Mobility status PT Visit Diagnosis: Unsteadiness on feet (R26.81);Pain Pain - Right/Left: Left Pain - part of body: Hip     Time: 8475-8442 PT Time Calculation (min) (ACUTE ONLY): 33 min  Charges:    $Gait Training: 8-22 mins $Therapeutic Activity: 8-22 mins PT General Charges $$ ACUTE PT VISIT: 1 Visit                     Deanie DOROTHA Harms, SPT   Johari Bennetts 12/08/2023, 4:40 PM

## 2023-12-08 NOTE — Progress Notes (Signed)
 Pt has no BM yet post-surgery but reported more gas. MD ordered suppository PRN but pt refused at the moment, wants to do it tonight with female RN.

## 2023-12-09 DIAGNOSIS — S72102A Unspecified trochanteric fracture of left femur, initial encounter for closed fracture: Secondary | ICD-10-CM | POA: Diagnosis not present

## 2023-12-09 LAB — CBC
HCT: 25.8 % — ABNORMAL LOW (ref 36.0–46.0)
Hemoglobin: 8.3 g/dL — ABNORMAL LOW (ref 12.0–15.0)
MCH: 28.9 pg (ref 26.0–34.0)
MCHC: 32.2 g/dL (ref 30.0–36.0)
MCV: 89.9 fL (ref 80.0–100.0)
Platelets: 279 10*3/uL (ref 150–400)
RBC: 2.87 MIL/uL — ABNORMAL LOW (ref 3.87–5.11)
RDW: 13.5 % (ref 11.5–15.5)
WBC: 6.5 10*3/uL (ref 4.0–10.5)
nRBC: 0 % (ref 0.0–0.2)

## 2023-12-09 LAB — GLUCOSE, CAPILLARY
Glucose-Capillary: 119 mg/dL — ABNORMAL HIGH (ref 70–99)
Glucose-Capillary: 113 mg/dL — ABNORMAL HIGH (ref 70–99)

## 2023-12-09 MED ORDER — PANTOPRAZOLE SODIUM 40 MG PO TBEC
40.0000 mg | DELAYED_RELEASE_TABLET | Freq: Every day | ORAL | Status: DC
  Administered 2023-12-09: 40 mg via ORAL
  Filled 2023-12-09: qty 1

## 2023-12-09 NOTE — TOC Progression Note (Signed)
 Transition of Care (TOC) - Progression Note   [facility] aware of discharge today  Patient Details  Name: Cynthia Gilmore MRN: [id number] Date of Birth: 1934-10-12  Transition of Care [facility]) CM/SW Contact  Aurelius Gildersleeve, Arturo Late, RN Phone Number: 12/09/2023, 1:07 PM  Clinical Narrative:       Expected Discharge Plan: Home w Home Health Services Barriers to Discharge: Continued Medical Work up  Expected Discharge Plan and Services   Discharge Planning Services: CM Consult Post Acute Care Choice: Home Health Living arrangements for the past 2 months: Single Family Home Expected Discharge Date: 12/09/23               DME Arranged: N/A         HH Arranged: PT (see note)           Social Determinants of Health (SDOH) Interventions SDOH Screenings   Food Insecurity: No Food Insecurity (12/05/2023)  Housing: Low Risk  (12/05/2023)  Transportation Needs: No Transportation Needs (12/05/2023)  Utilities: Not At Risk (12/05/2023)  Social Connections: Moderately Integrated (12/05/2023)  Tobacco Use: Medium Risk (12/05/2023)    Readmission Risk Interventions     No data to display

## 2023-12-09 NOTE — Care Management Important Message (Signed)
 Important Message  Patient Details  Name: Cynthia Gilmore MRN: [id number] Date of Birth: March 15, 1934   Important Message Given:  Yes - Medicare IM     Janith Melnick 12/09/2023, 3:34 PM

## 2023-12-09 NOTE — Discharge Summary (Signed)
 Physician Discharge Summary   Patient: Cynthia Gilmore MRN: [id number] DOB: 04/21/1934  Admit date:     12/04/2023  Discharge date: 12/09/23  Discharge Physician: Aneita Keens   PCP: Elester Grim, MD   Recommendations at discharge:  PCP visit for hospital follow-up Orthopedic visit for hospital follow-up  Discharge Diagnoses: Principal Problem:   Femoral fracture [facility]) Active Problems:   Essential hypertension   Type II diabetes mellitus with neurological manifestations (HCC)   Paroxysmal supraventricular tachycardia (HCC)   Anxiety  Resolved Problems:   * No resolved hospital problems. *  Hospital Course: Cynthia Gilmore is a 89 y.o. female with a history of anxiety, depression, diabetes mellitus type 2, GERD, hypertension, TIA.  Patient presented secondary to a fall with resultant left leg pain.  Patient was found to have evidence of a left femoral fracture.  Orthopedic surgery was consulted and performed intramedullary nail placement on 1/11.  Patient is weightbearing as tolerated.  DVT prophylaxis per orthopedic surgery include aspirin  81 mg twice daily.  Patient follow-up with orthopedic surgery as an outpatient.  Patient discharged home with home health physical and Occupational Therapy.  Assessment and Plan:  Left femur fracture Secondary to fall.  Orthopedic surgery was consulted and performed intramedullary nailing on 1/11.  Patient is weightbearing as tolerated of left lower extremity.  Recommendation for aspirin  81 mg twice daily for DVT prophylaxis per orthopedic surgery recommendations.  PT and OT recommend home health services on discharge.  Anxiety Continue Lexapro , Cymbalta , Xanax  on discharge  Paroxysmal supraventricular tachycardia Noted.  Patient is on metoprolol  as an outpatient.  Continue metoprolol  on discharge.  Acute blood loss anemia Chronic anemia Baseline hemoglobin of about 10 g/dL.  Patient with acute anemia secondary to perioperative  blood loss.  Hemoglobin as low as 7.7 g/dL with stabilized hemoglobin up to 8.3 g/dL prior to discharge.  Diabetes mellitus type 2 Controlled with hemoglobin A1c of 6.1%.  Continue metformin  on discharge.  Primary hypertension Continue losartan , spironolactone , metoprolol .   Consultants: Orthopedic surgery Procedures performed: Intramedullary nail placement Disposition: Home health Diet recommendation: Carb modified diet   DISCHARGE MEDICATION: Allergies as of 12/09/2023       Reactions   Alprazolam     REACTION: sleepy   Amlodipine Besylate    REACTION: swelling   Aripiprazole    Augmentin  [amoxicillin -pot Clavulanate] Diarrhea   Azithromycin    Bupropion Hcl    REACTION: insomnia   Cefaclor    REACTION: mouth blisters   Celecoxib    REACTION: "drained"   Cephalexin    Ciprofloxacin    Clonidine Hydrochloride    REACTION: headache, dizziness   Desvenlafaxine    Diphenoxylate-atropine    Divalproex Sodium    REACTION: nausea,tremors,fatigue   Doxycycline    Entex    REACTION: nausea, headache   Est Estrogens-methyltest    REACTION: headache   Fluoxetine Hcl    Furosemide    Hydrochlorothiazide    Hydrocodone-acetaminophen     REACTION: headache   Lamotrigine    REACTION: mood swing,chills, nausea   Lansoprazole    Lisinopril    REACTION: cough   Lorazepam    Meloxicam    REACTION: nausea, diarrhea   Methylphenidate Hcl    Metronidazole    Mirtazapine    REACTION: swelling   Nabumetone    REACTION: nausea, headache   Nitrofuran Derivatives Nausea Only   Olmesartan Medoxomil    Omeprazole  Diarrhea   Other    Alum and Mag Hydroxide  Simeth(Solution) GI Cocktail. Diarrhea    Oxycodone -acetaminophen     Pantoprazole  Sodium    Paroxetine    REACTION: tremor, insomnia, nausea   Quetiapine    Rabeprazole Sodium    REACTION: diarrhea   Rofecoxib    Sertraline Hcl    REACTION: exreme confusion   Sumatriptan    REACTION: cardiologist says "no"    Telithromycin    REACTION: "drained"   Topamax    Confusion and mood swings   Tramadol-acetaminophen     Trintellix [vortioxetine] Other (See Comments)   "Made things worst"   Venlafaxine    REACTION: hypertension, nausea, headache   Verapamil    Headaches and hot flashes    Vilazodone Hcl [vilazodone Hcl]         Medication List     STOP taking these medications    ibuprofen 200 MG tablet Commonly known as: ADVIL       TAKE these medications    acetaminophen  500 MG tablet Commonly known as: TYLENOL  Take 2 tablets (1,000 mg total) by mouth every 8 (eight) hours for 14 days.   ALPRAZolam  0.25 MG tablet Commonly known as: XANAX  TAKE 1/2 TO 1 TABLET BY MOUTH ONCE DAILY AS NEEDED   anti-nausea solution Take 5 mLs by mouth as needed for nausea or vomiting.   aspirin  81 MG chewable tablet Chew 1 tablet (81 mg total) by mouth 2 (two) times daily.   augmented betamethasone dipropionate 0.05 % cream Commonly known as: DIPROLENE-AF Apply 1 Application topically 2 (two) times daily as needed.   benzonatate 100 MG capsule Commonly known as: TESSALON Take 100 mg by mouth 3 (three) times daily as needed.   budesonide  3 MG 24 hr capsule Commonly known as: ENTOCORT EC  Take 3 capsules (9 mg total) by mouth daily.   ciclopirox 8 % solution Commonly known as: PENLAC Apply 1 Application topically daily.   DULoxetine  60 MG capsule Commonly known as: CYMBALTA  Take 60 mg by mouth daily.   escitalopram  20 MG tablet Commonly known as: LEXAPRO  Take 20 mg by mouth daily.   ESTROVEN ENERGY PO Take by mouth.   famotidine  40 MG tablet Commonly known as: PEPCID  TAKE 1 TABLET BY MOUTH TWICE DAILY   glucose blood test strip Commonly known as: Accu-Chek SmartView 1 each by Other route daily.   hyoscyamine  0.125 MG Tbdp disintergrating tablet Commonly known as: ANASPAZ  Place 1 tablet (0.125 mg total) under the tongue every 4 (four) hours as needed for cramping.    losartan  100 MG tablet Commonly known as: COZAAR  Take 100 mg by mouth daily.   metFORMIN  500 MG 24 hr tablet Commonly known as: GLUCOPHAGE -XR TAKE 1 TABLET(500 MG) BY MOUTH TWICE DAILY WITH A MEAL   metoprolol  succinate 50 MG 24 hr tablet Commonly known as: TOPROL -XL TAKE WITH OR IMMEDIATELY FOLLOWING A MEAL   metoprolol  succinate 25 MG 24 hr tablet Commonly known as: TOPROL -XL TAKE 1 TABLET(25 MG) BY MOUTH TWICE DAILY IN ADDITION TO YOUR 50 MG TABLET TO TOTAL 75 MG DAILY   omeprazole  40 MG capsule Commonly known as: PRILOSEC Take 40 mg by mouth every morning.   oxyCODONE  5 MG immediate release tablet Commonly known as: Oxy IR/ROXICODONE  Take 0.5-1 tablets (2.5-5 mg total) by mouth every 4 (four) hours as needed for up to 7 days for moderate pain (pain score 4-6) or severe pain (pain score 7-10).   polyethylene glycol 17 g packet Commonly known as: MIRALAX  / GLYCOLAX  Take 17 g by mouth daily for 14  days.   senna-docusate 8.6-50 MG tablet Commonly known as: Senokot-S Take 1 tablet by mouth 2 (two) times daily for 14 days.   spironolactone  25 MG tablet Commonly known as: ALDACTONE  TAKE 1 AND 1/2 TABLETS(37.5 MG) BY MOUTH DAILY   Vitamin C 500 MG Caps Take by mouth.        Follow-up Information     [facility], [facility] Follow up.   Specialty: Home Health Services Contact information: [address] [place] [address] [phone]         Elester Grim, MD. Schedule an appointment as soon as possible for a visit in 1 week(s).   Specialty: Internal Medicine Why: For hospital follow-up Contact information: [address] [place] [address] [phone]         Diedra Fowler, MD. Schedule an appointment as soon as possible for a visit in 1 week(s).   Specialty: Orthopedic Surgery Why: For hospital follow-up Contact information: [address] [place] [address] [phone]                 Discharge Exam: BP 124/62 (BP Location: Left Arm)   Pulse 73   Temp 98.2 F (36.8 C) (Oral)   Resp 16   Ht 5\' 5"  (1.651 m)   Wt 63.5 kg   SpO2 100%   BMI 23.30 kg/m   General exam: Appears calm and comfortable Respiratory system: Clear to auscultation. Respiratory effort normal. Cardiovascular system: S1 & S2 heard, RRR. No murmurs, rubs, gallops or clicks. Gastrointestinal system: Abdomen is nondistended, soft and nontender. Normal bowel sounds heard. Central nervous system: Alert and oriented. No focal neurological deficits. Musculoskeletal: No edema. No calf tenderness Skin: Multiple areas of ecchymosis of left thigh laterally, posteriorly and medially Psychiatry: Judgement and insight appear normal. Mood & affect appropriate.   Condition at discharge: stable  The results of significant diagnostics from this hospitalization (including imaging, microbiology, ancillary and laboratory) are listed below for reference.   Imaging Studies: DG HIP UNILAT WITH PELVIS 2-3 VIEWS LEFT Result Date: 12/05/2023 CLINICAL DATA:  Postoperative left hip ORIF.  Fracture. EXAM: DG HIP (WITH OR WITHOUT PELVIS) 2-3V LEFT COMPARISON:  Pelvis and left hip radiographs 12/04/2023 FINDINGS: Interval cephalomedullary nail fixation of the previously seen proximal left femoral intertrochanteric fracture. There is improved, near anatomic alignment. No perihardware lucency is seen to indicate hardware failure or loosening. Expected postoperative changes including lateral left hip subcutaneous air and lateral left hip surgical skin staples. The bilateral femoroacetabular joint spaces are maintained. Mild bilateral superolateral acetabular degenerative osteophytes. Mild pubic symphysis joint space narrowing and peripheral osteophytosis. IMPRESSION: Interval cephalomedullary nail fixation of the previously seen proximal left femoral intertrochanteric fracture with improved, near anatomic alignment. Electronically  Signed   By: Bertina Broccoli M.D.   On: 12/05/2023 15:19   DG HIP UNILAT WITH PELVIS 2-3 VIEWS LEFT Result Date: 12/05/2023 CLINICAL DATA:  [id number] Surgery, elective [id number] EXAM: DG HIP (WITH OR WITHOUT PELVIS) 2-3V LEFT COMPARISON:  12/04/2023 FINDINGS: Five C-arm fluoroscopic images were obtained intraoperatively and submitted for post operative interpretation. Images obtained during left hip ORIF secondary to intertrochanteric left femur fracture. Alignment appears near anatomic. 1 minute and 16 seconds of fluoroscopy time utilized. Radiation dose: 7.56 mGy. Please see the performing provider's procedural report for further detail. IMPRESSION: Intraoperative fluoroscopy during left hip ORIF. Electronically Signed   By: Leverne Reading D.O.   On: 12/05/2023 13:33   DG C-Arm 1-60 Min-No Report  Result Date: 12/05/2023 Fluoroscopy was utilized by the requesting physician.  No radiographic interpretation.   DG FEMUR MIN 2 VIEWS LEFT Result Date: 12/04/2023 CLINICAL DATA:  Mechanical fall onto left hip. Intertrochanteric hip fracture. EXAM: LEFT FEMUR 2 VIEWS COMPARISON:  Hip radiograph earlier today FINDINGS: Redemonstrated mildly displaced intertrochanteric proximal femur fracture. No hip dislocation. The distal femur is intact. No additional fracture. Knee alignment is normal with minor degenerative change. IMPRESSION: Intertrochanteric proximal femur fracture. The distal femur is intact. Electronically Signed   By: Chadwick Colonel M.D.   On: 12/04/2023 22:18   CT Head Wo Contrast Result Date: 12/04/2023 CLINICAL DATA:  Fall EXAM: CT HEAD WITHOUT CONTRAST TECHNIQUE: Contiguous axial images were obtained from the base of the skull through the vertex without intravenous contrast. RADIATION DOSE REDUCTION: This exam was performed according to the departmental dose-optimization program which includes automated exposure control, adjustment of the mA and/or kV according to patient size and/or use of iterative  reconstruction technique. COMPARISON:  02/23/2023 FINDINGS: Brain: No evidence of acute infarction, hemorrhage, mass, mass effect, or midline shift. No hydrocephalus or extra-axial fluid collection. Partial empty sella. Normal craniocervical junction. Vascular: No hyperdense vessel. Skull: Negative for fracture or focal lesion. Sinuses/Orbits: No acute finding. Other: The mastoid air cells are well aerated. IMPRESSION: No acute intracranial process. Electronically Signed   By: Zoila Hines M.D.   On: 12/04/2023 19:41   DG Chest Portable 1 View Result Date: 12/04/2023 CLINICAL DATA:  Mechanical fall.  Hip and knee pain. EXAM: PORTABLE CHEST 1 VIEW COMPARISON:  Chest radiograph 02/23/2023 FINDINGS: The cardiomediastinal contours are normal. Aortic atherosclerosis. Pulmonary vasculature is normal. No consolidation, pleural effusion, or pneumothorax. No acute osseous abnormalities are seen. IMPRESSION: No active disease. Electronically Signed   By: Chadwick Colonel M.D.   On: 12/04/2023 19:04   DG Hip Unilat W or Wo Pelvis 2-3 Views Left Result Date: 12/04/2023 CLINICAL DATA:  Fall, left hip pain EXAM: DG HIP (WITH OR WITHOUT PELVIS) 2-3V LEFT COMPARISON:  None Available. FINDINGS: Mildly displaced intertrochanteric left hip fracture. Bilateral hip joint spaces are preserved. Visualized bony pelvis appears intact. IMPRESSION: Mildly displaced intertrochanteric left hip fracture. Electronically Signed   By: Zadie Herter M.D.   On: 12/04/2023 19:02    Microbiology: Results for orders placed or performed in visit on 06/06/16  Urine Culture     Status: None   Collection Time: 06/06/16  2:44 PM   Specimen: Urine  Result Value Ref Range Status   Colony Count 25,000 COLONIES/ML  Final   Organism ID, Bacteria Multiple bacterial morphotypes present, none  Final   Organism ID, Bacteria predominant. Suggest appropriate recollection if  Final   Organism ID, Bacteria clinically indicated.  Final     Labs: CBC: Recent Labs  Lab 12/04/23 1843 12/05/23 0030 12/05/23 0529 12/06/23 0557 12/07/23 0508 12/08/23 0609 12/09/23 0729  WBC 11.2*   < > 8.3 10.7* 8.2 6.3 6.5  NEUTROABS 7.8*  --   --   --   --   --   --   HGB 11.3*   < > 10.1* 8.4* 8.3* 7.7* 8.3*  HCT 34.9*   < > 30.8* 25.8* 24.5* 23.3* 25.8*  MCV 89.9   < > 90.9 89.3 88.1 89.3 89.9  PLT 244   < > 202 186 177 232 279   < > = values in this interval not displayed.   Basic Metabolic Panel: Recent Labs  Lab 12/04/23 1843 12/05/23 0030 12/05/23 0529 12/06/23  1610 12/07/23 0508 12/08/23 0609  NA 140  --  140 136 136 138  K 3.9  --  3.9 4.2 3.9 4.3  CL 106  --  105 103 102 104  CO2 22  --  26 26 24 26   GLUCOSE 134*  --  109* 173* 120* 109*  BUN 16  --  12 13 14 14   CREATININE 0.66 0.57 0.50 0.66 0.63 0.61  CALCIUM  8.7*  --  8.2* 8.3* 8.4* 8.5*    CBG: Recent Labs  Lab 12/08/23 1218 12/08/23 1625 12/08/23 2013 12/09/23 0805 12/09/23 1149  GLUCAP 93 98 118* 119* 113*    Discharge time spent: 35 minutes.  Signed: Aneita Keens, MD [facility] 12/09/2023

## 2023-12-09 NOTE — Progress Notes (Signed)
 Piv dcd. Site unremarkable. AVS reviewed with patient , verbalized understanding. Patient now waiting on ride.

## 2023-12-09 NOTE — Progress Notes (Signed)
 Physical Therapy Treatment Patient Details Name: Cynthia Gilmore MRN: [id number] DOB: Mar 29, 1934 Today's Date: 12/09/2023   History of Present Illness 89 y.o. female who had a ground level fall at home resulting in left femoral neck fx s/p  IM Nail 1/11  PMH includes: Depression/anxiety  DM  GERD  Hemorrhoids  HTN  HLD  Hiatal hernia  IBS  Lymphocytic colitis  History of TIA.    PT Comments  Pt progressing steadily towards her physical therapy goals, demonstrating improved activity tolerance and ambulation distance this session. Pt able to perform seated warm up exercises and ambulating 200 ft with a walker, utilizing a step through pattern. Reviewed fall prevention precautions and home set up. Recommend HHPT at d/c.    If plan is discharge home, recommend the following: Assistance with cooking/housework   Can travel by Training and development officer (2 wheels)    Recommendations for Other Services       Precautions / Restrictions Precautions Precautions: Fall Restrictions Weight Bearing Restrictions Per Provider Order: Yes LLE Weight Bearing Per Provider Order: Weight bearing as tolerated     Mobility  Bed Mobility Overal bed mobility: Modified Independent Bed Mobility: Supine to Sit     Supine to sit: Modified independent (Device/Increase time)     General bed mobility comments: Utilized UE support to sit at the EOB, uses hand rail, elevated HOB    Transfers Overall transfer level: Modified independent Equipment used: Rolling walker (2 wheels)                    Ambulation/Gait Ambulation/Gait assistance: Supervision Gait Distance (Feet): 200 Feet Assistive device: Rolling walker (2 wheels) Gait Pattern/deviations: Step-through pattern, Narrow base of support       General Gait Details: Verbal cues for upward gaze and wider BOS. Good step through pattern and left heel strike at initial contact   Stairs              Wheelchair Mobility     Tilt Bed    Modified Rankin (Stroke Patients Only)       Balance Overall balance assessment: Modified Independent Sitting-balance support: Feet supported Sitting balance-Leahy Scale: Good     Standing balance support: Bilateral upper extremity supported Standing balance-Leahy Scale: Good                              Cognition Arousal: Alert Behavior During Therapy: WFL for tasks assessed/performed Overall Cognitive Status: Within Functional Limits for tasks assessed                                          Exercises General Exercises - Lower Extremity Long Arc Quad: Both, 10 reps, Seated Hip Flexion/Marching: Both, 5 reps, Seated Heel Raises: Both, 10 reps, Seated    General Comments        Pertinent Vitals/Pain Pain Assessment Pain Assessment: Faces Faces Pain Scale: Hurts a little bit Pain Location: L thigh Pain Descriptors / Indicators: Sore, Grimacing, Guarding Pain Intervention(s): Monitored during session    Home Living                          Prior Function            PT  Goals (current goals can now be found in the care plan section) Acute Rehab PT Goals Patient Stated Goal: Hopes to get home soon PT Goal Formulation: With patient Time For Goal Achievement: 12/20/23 Potential to Achieve Goals: Good Progress towards PT goals: Progressing toward goals    Frequency    Min 1X/week      PT Plan      Co-evaluation              AM-PAC PT "6 Clicks" Mobility   Outcome Measure  Help needed turning from your back to your side while in a flat bed without using bedrails?: None Help needed moving from lying on your back to sitting on the side of a flat bed without using bedrails?: None Help needed moving to and from a bed to a chair (including a wheelchair)?: None Help needed standing up from a chair using your arms (e.g., wheelchair or bedside chair)?: None Help  needed to walk in hospital room?: A Little Help needed climbing 3-5 steps with a railing? : A Lot 6 Click Score: 21    End of Session Equipment Utilized During Treatment: Gait belt Activity Tolerance: Patient tolerated treatment well Patient left: in bed;with bed alarm set;with call bell/phone within reach Nurse Communication: Mobility status PT Visit Diagnosis: Unsteadiness on feet (R26.81);Pain Pain - Right/Left: Left Pain - part of body: Hip     Time: 1000-1027 PT Time Calculation (min) (ACUTE ONLY): 27 min  Charges:    $Gait Training: 8-22 mins $Therapeutic Activity: 8-22 mins PT General Charges $$ ACUTE PT VISIT: 1 Visit                     Cynthia Gilmore, PT, DPT Acute Rehabilitation Services Office [phone]    Cynthia Gilmore 12/09/2023, 10:34 AM

## 2023-12-09 NOTE — Progress Notes (Signed)
 Pt daughter called and spoke to RN about her concern about pt coming home without having a BM since 1/10. RN notified MD of pt not having a BM since 1/10 and asked if he was still comfortable with her DC and MD stated that  " She has no signs/symptoms of obstruction, so yes he is comfortable with her DC". RN notified pt's daughter and she was comfortable stated she will be on the way to pick up her mother shortly.

## 2023-12-11 ENCOUNTER — Telehealth: Payer: Self-pay | Admitting: *Deleted

## 2023-12-11 NOTE — Telephone Encounter (Signed)
Pt had surgery on Saturday and came home on Wednesday and last night per daughter she has had diarrhea and today at lunch she has started throwing up. Pt wants to know what can be done. Please advise.

## 2023-12-13 ENCOUNTER — Encounter (HOSPITAL_COMMUNITY): Payer: Self-pay

## 2023-12-13 ENCOUNTER — Other Ambulatory Visit: Payer: Self-pay

## 2023-12-13 ENCOUNTER — Observation Stay (HOSPITAL_COMMUNITY)
Admission: EM | Admit: 2023-12-13 | Discharge: 2023-12-15 | Disposition: A | Discharge status: Home / Self Care | Payer: Medicare Other | Attending: Family Medicine | Admitting: Family Medicine

## 2023-12-13 ENCOUNTER — Observation Stay (HOSPITAL_COMMUNITY): Payer: Medicare Other

## 2023-12-13 DIAGNOSIS — R197 Diarrhea, unspecified: Secondary | ICD-10-CM | POA: Diagnosis present

## 2023-12-13 DIAGNOSIS — Z87891 Personal history of nicotine dependence: Secondary | ICD-10-CM | POA: Diagnosis not present

## 2023-12-13 DIAGNOSIS — I4581 Long QT syndrome: Secondary | ICD-10-CM | POA: Insufficient documentation

## 2023-12-13 DIAGNOSIS — E876 Hypokalemia: Secondary | ICD-10-CM | POA: Insufficient documentation

## 2023-12-13 DIAGNOSIS — K219 Gastro-esophageal reflux disease without esophagitis: Secondary | ICD-10-CM | POA: Insufficient documentation

## 2023-12-13 DIAGNOSIS — E860 Dehydration: Secondary | ICD-10-CM | POA: Insufficient documentation

## 2023-12-13 DIAGNOSIS — D649 Anemia, unspecified: Secondary | ICD-10-CM | POA: Diagnosis present

## 2023-12-13 DIAGNOSIS — Z1152 Encounter for screening for COVID-19: Secondary | ICD-10-CM | POA: Insufficient documentation

## 2023-12-13 DIAGNOSIS — R7401 Elevation of levels of liver transaminase levels: Secondary | ICD-10-CM | POA: Diagnosis not present

## 2023-12-13 DIAGNOSIS — A0811 Acute gastroenteropathy due to Norwalk agent: Secondary | ICD-10-CM | POA: Diagnosis not present

## 2023-12-13 DIAGNOSIS — R9431 Abnormal electrocardiogram [ECG] [EKG]: Secondary | ICD-10-CM | POA: Diagnosis present

## 2023-12-13 DIAGNOSIS — R112 Nausea with vomiting, unspecified: Principal | ICD-10-CM | POA: Diagnosis present

## 2023-12-13 DIAGNOSIS — Z79899 Other long term (current) drug therapy: Secondary | ICD-10-CM | POA: Diagnosis not present

## 2023-12-13 DIAGNOSIS — A0839 Other viral enteritis: Secondary | ICD-10-CM | POA: Insufficient documentation

## 2023-12-13 DIAGNOSIS — E1149 Type 2 diabetes mellitus with other diabetic neurological complication: Secondary | ICD-10-CM | POA: Diagnosis not present

## 2023-12-13 DIAGNOSIS — F341 Dysthymic disorder: Secondary | ICD-10-CM | POA: Diagnosis present

## 2023-12-13 DIAGNOSIS — M1990 Unspecified osteoarthritis, unspecified site: Secondary | ICD-10-CM | POA: Insufficient documentation

## 2023-12-13 DIAGNOSIS — A084 Viral intestinal infection, unspecified: Secondary | ICD-10-CM | POA: Diagnosis not present

## 2023-12-13 DIAGNOSIS — I10 Essential (primary) hypertension: Secondary | ICD-10-CM | POA: Diagnosis present

## 2023-12-13 DIAGNOSIS — Z8781 Personal history of (healed) traumatic fracture: Secondary | ICD-10-CM

## 2023-12-13 DIAGNOSIS — F32A Depression, unspecified: Secondary | ICD-10-CM | POA: Insufficient documentation

## 2023-12-13 LAB — C DIFFICILE QUICK SCREEN W PCR REFLEX
C Diff antigen: NEGATIVE
C Diff interpretation: NOT DETECTED
C Diff toxin: NEGATIVE

## 2023-12-13 LAB — CBC WITH DIFFERENTIAL/PLATELET
Abs Immature Granulocytes: 0.06 10*3/uL (ref 0.00–0.07)
Basophils Absolute: 0 10*3/uL (ref 0.0–0.1)
Basophils Relative: 0 %
Eosinophils Absolute: 0 10*3/uL (ref 0.0–0.5)
Eosinophils Relative: 0 %
HCT: 25.8 % — ABNORMAL LOW (ref 36.0–46.0)
Hemoglobin: 8.5 g/dL — ABNORMAL LOW (ref 12.0–15.0)
Immature Granulocytes: 1 %
Lymphocytes Relative: 24 %
Lymphs Abs: 1.6 10*3/uL (ref 0.7–4.0)
MCH: 29.8 pg (ref 26.0–34.0)
MCHC: 32.9 g/dL (ref 30.0–36.0)
MCV: 90.5 fL (ref 80.0–100.0)
Monocytes Absolute: 0.8 10*3/uL (ref 0.1–1.0)
Monocytes Relative: 12 %
Neutro Abs: 4.2 10*3/uL (ref 1.7–7.7)
Neutrophils Relative %: 63 %
Platelets: 379 10*3/uL (ref 150–400)
RBC: 2.85 MIL/uL — ABNORMAL LOW (ref 3.87–5.11)
RDW: 15.6 % — ABNORMAL HIGH (ref 11.5–15.5)
WBC: 6.8 10*3/uL (ref 4.0–10.5)
nRBC: 0 % (ref 0.0–0.2)

## 2023-12-13 LAB — COMPREHENSIVE METABOLIC PANEL
ALT: 60 U/L — ABNORMAL HIGH (ref 0–44)
AST: 73 U/L — ABNORMAL HIGH (ref 15–41)
Albumin: 3.4 g/dL — ABNORMAL LOW (ref 3.5–5.0)
Alkaline Phosphatase: 53 U/L (ref 38–126)
Anion gap: 13 (ref 5–15)
BUN: 16 mg/dL (ref 8–23)
CO2: 18 mmol/L — ABNORMAL LOW (ref 22–32)
Calcium: 8.2 mg/dL — ABNORMAL LOW (ref 8.9–10.3)
Chloride: 107 mmol/L (ref 98–111)
Creatinine, Ser: 0.69 mg/dL (ref 0.44–1.00)
GFR, Estimated: >60 mL/min (ref >60)
Glucose, Bld: 127 mg/dL — ABNORMAL HIGH (ref 70–99)
Potassium: 2.7 mmol/L — CL (ref 3.5–5.1)
Sodium: 138 mmol/L (ref 135–145)
Total Bilirubin: 1 mg/dL (ref 0.0–1.2)
Total Protein: 6 g/dL — ABNORMAL LOW (ref 6.5–8.1)

## 2023-12-13 LAB — RESP PANEL BY RT-PCR (RSV, FLU A&B, COVID)  RVPGX2
Influenza A by PCR: NEGATIVE
Influenza B by PCR: NEGATIVE
Resp Syncytial Virus by PCR: NEGATIVE
SARS Coronavirus 2 by RT PCR: NEGATIVE

## 2023-12-13 LAB — RESPIRATORY PANEL BY PCR

## 2023-12-13 LAB — MAGNESIUM: Magnesium: 1.3 mg/dL — ABNORMAL LOW (ref 1.7–2.4)

## 2023-12-13 LAB — POC OCCULT BLOOD, ED: Fecal Occult Bld: NEGATIVE

## 2023-12-13 LAB — ACETAMINOPHEN LEVEL: Acetaminophen (Tylenol), Serum: <10 ug/mL — ABNORMAL LOW (ref 10–30)

## 2023-12-13 MED ORDER — ALPRAZOLAM 0.25 MG PO TABS
0.2500 mg | ORAL_TABLET | Freq: Two times a day (BID) | ORAL | Status: DC | PRN
  Administered 2023-12-13 – 2023-12-15 (×5): 0.25 mg via ORAL
  Filled 2023-12-13 (×5): qty 1

## 2023-12-13 MED ORDER — POTASSIUM CHLORIDE 10 MEQ/100ML IV SOLN
10.0000 meq | INTRAVENOUS | Status: AC
  Administered 2023-12-13 (×2): 10 meq via INTRAVENOUS
  Filled 2023-12-13 (×2): qty 100

## 2023-12-13 MED ORDER — METOPROLOL SUCCINATE ER 25 MG PO TB24
75.0000 mg | ORAL_TABLET | Freq: Every day | ORAL | Status: DC
  Administered 2023-12-14 – 2023-12-15 (×2): 75 mg via ORAL
  Filled 2023-12-13 (×2): qty 3

## 2023-12-13 MED ORDER — ACETAMINOPHEN 325 MG PO TABS
650.0000 mg | ORAL_TABLET | Freq: Four times a day (QID) | ORAL | Status: DC | PRN
  Administered 2023-12-13 – 2023-12-14 (×2): 650 mg via ORAL
  Filled 2023-12-13 (×2): qty 2

## 2023-12-13 MED ORDER — PANTOPRAZOLE SODIUM 40 MG PO TBEC
40.0000 mg | DELAYED_RELEASE_TABLET | Freq: Every day | ORAL | Status: DC
  Administered 2023-12-13 – 2023-12-15 (×3): 40 mg via ORAL
  Filled 2023-12-13 (×3): qty 1

## 2023-12-13 MED ORDER — CALCIUM GLUCONATE-NACL 1-0.675 GM/50ML-% IV SOLN
1.0000 g | Freq: Once | INTRAVENOUS | Status: AC
Start: 2023-12-13 — End: 2023-12-13
  Administered 2023-12-13: 1000 mg via INTRAVENOUS
  Filled 2023-12-13: qty 50

## 2023-12-13 MED ORDER — MAGNESIUM SULFATE IN D5W 1-5 GM/100ML-% IV SOLN
1.0000 g | Freq: Once | INTRAVENOUS | Status: AC
  Administered 2023-12-13: 1 g via INTRAVENOUS
  Filled 2023-12-13: qty 100

## 2023-12-13 MED ORDER — METOPROLOL SUCCINATE ER 25 MG PO TB24: 50.0000 mg | ORAL_TABLET | Freq: Every day | ORAL | Status: DC

## 2023-12-13 MED ORDER — MAGNESIUM OXIDE -MG SUPPLEMENT 400 (240 MG) MG PO TABS
400.0000 mg | ORAL_TABLET | Freq: Once | ORAL | Status: AC
  Administered 2023-12-13: 400 mg via ORAL
  Filled 2023-12-13: qty 1

## 2023-12-13 MED ORDER — TRIMETHOBENZAMIDE HCL 100 MG/ML IM SOLN: 200.0000 mg | Freq: Four times a day (QID) | INTRAMUSCULAR | Status: DC | PRN

## 2023-12-13 MED ORDER — ESCITALOPRAM OXALATE 20 MG PO TABS
20.0000 mg | ORAL_TABLET | Freq: Every day | ORAL | Status: DC
  Administered 2023-12-13 – 2023-12-15 (×3): 20 mg via ORAL
  Filled 2023-12-13: qty 2
  Filled 2023-12-13 (×2): qty 1

## 2023-12-13 MED ORDER — SODIUM CHLORIDE 0.9% FLUSH
3.0000 mL | Freq: Two times a day (BID) | INTRAVENOUS | Status: DC
  Administered 2023-12-14 – 2023-12-15 (×3): 3 mL via INTRAVENOUS

## 2023-12-13 MED ORDER — ACETAMINOPHEN 650 MG RE SUPP: 650.0000 mg | Freq: Four times a day (QID) | RECTAL | Status: DC | PRN

## 2023-12-13 MED ORDER — METOPROLOL SUCCINATE ER 25 MG PO TB24: 25.0000 mg | ORAL_TABLET | Freq: Every day | ORAL | Status: DC

## 2023-12-13 MED ORDER — ALBUTEROL SULFATE (2.5 MG/3ML) 0.083% IN NEBU: 2.5000 mg | INHALATION_SOLUTION | Freq: Four times a day (QID) | RESPIRATORY_TRACT | Status: DC | PRN

## 2023-12-13 MED ORDER — ENOXAPARIN SODIUM 40 MG/0.4ML IJ SOSY
40.0000 mg | PREFILLED_SYRINGE | INTRAMUSCULAR | Status: DC
  Administered 2023-12-13 – 2023-12-14 (×2): 40 mg via SUBCUTANEOUS
  Filled 2023-12-13 (×2): qty 0.4

## 2023-12-13 MED ORDER — OXYCODONE HCL 5 MG PO TABS: 2.5000 mg | ORAL_TABLET | ORAL | Status: DC | PRN

## 2023-12-13 MED ORDER — LACTATED RINGERS IV BOLUS
1000.0000 mL | Freq: Once | INTRAVENOUS | Status: AC
  Administered 2023-12-13: 1000 mL via INTRAVENOUS

## 2023-12-13 MED ORDER — HYOSCYAMINE SULFATE 0.125 MG PO TBDP: 0.1250 mg | ORAL_TABLET | ORAL | Status: DC | PRN

## 2023-12-13 MED ORDER — POTASSIUM CHLORIDE CRYS ER 20 MEQ PO TBCR
40.0000 meq | EXTENDED_RELEASE_TABLET | Freq: Once | ORAL | Status: AC
  Administered 2023-12-13: 40 meq via ORAL
  Filled 2023-12-13: qty 2

## 2023-12-13 MED ORDER — LOSARTAN POTASSIUM 50 MG PO TABS
100.0000 mg | ORAL_TABLET | Freq: Every day | ORAL | Status: DC
  Administered 2023-12-13 – 2023-12-15 (×3): 100 mg via ORAL
  Filled 2023-12-13 (×3): qty 2

## 2023-12-13 MED ORDER — LACTATED RINGERS IV SOLN
INTRAVENOUS | Status: AC
  Administered 2023-12-13: 100 mL/h via INTRAVENOUS

## 2023-12-13 MED ORDER — DULOXETINE HCL 60 MG PO CPEP
60.0000 mg | ORAL_CAPSULE | Freq: Every day | ORAL | Status: DC
  Administered 2023-12-13 – 2023-12-15 (×3): 60 mg via ORAL
  Filled 2023-12-13 (×3): qty 1

## 2023-12-13 MED ORDER — HYOSCYAMINE SULFATE 0.125 MG SL SUBL: 0.1250 mg | SUBLINGUAL_TABLET | SUBLINGUAL | Status: DC | PRN

## 2023-12-13 NOTE — ED Provider Notes (Signed)
[place] EMERGENCY DEPARTMENT AT [facility] Provider Note   CSN: [id number] Arrival date & time: 12/13/23  1142     History  Chief Complaint  Patient presents with   Diarrhea    Cynthia Gilmore is a 89 y.o. female with PMH as listed below who presents with diarrhea.   Pt BIB [facility] from home d/t diarrhea that has been going on for the past 2 days. Had >10 bowel movements today. Also had vomiting but has stopped.  Pt had hip surgery done last Saturday.  No signs of infection at the incision site. Pt's husband also has developed the symptoms three days after the patient did and he himself is a patient in the ED. Endorses watery "dark" diarrhea, didn't notice any blood. Doesn't feel lightheaded but feels very generally weak. Denies abdominal pain, urinary sxs. Reports that she has been taking tylenol 1,000 mg q8h. no recent travel or new foods.   Had 500 ml fluids w EMS. ED VSS. Afebrile. A&O X4.      Past Medical History:  Diagnosis Date   Allergy    Anxiety    Anxiety and depression    ? bipolar   Cataract    bil cataracts removed   Depression    Diabetes mellitus, type 2 (HCC)    Dysrhythmia    GERD (gastroesophageal reflux disease)    Heart murmur    Hemorrhoids 11/24/2010   Hiatal hernia    Hypertension    IBS (irritable bowel syndrome)    Lymphocytic colitis 11/24/2010   Mumerous medication allergies and sensitivities 07/20/2015   Osteoarthritis (arthritis due to wear and tear of joints)    Segmental colitis (HCC)    iscemic colitis 2009   Stroke [facility])    states 2 "'mini strokes" 1980   TIA (transient ischemic attack) 11/24/1998   x 3        Home Medications Prior to Admission medications   Medication Sig Start Date End Date Taking? Authorizing Provider  acetaminophen (TYLENOL) 500 MG tablet Take 2 tablets (1,000 mg total) by mouth every 8 (eight) hours for 14 days. 12/07/23 12/21/23  London Sheer, MD  ALPRAZolam Prudy Feeler) 0.25 MG tablet TAKE  1/2 TO 1 TABLET BY MOUTH ONCE DAILY AS NEEDED 03/23/17   Zola Button, Grayling Congress, DO  anti-nausea (EMETROL) solution Take 5 mLs by mouth as needed for nausea or vomiting.    [provider]  Ascorbic Acid (VITAMIN C) 500 MG CAPS Take by mouth.    [provider]  aspirin 81 MG chewable tablet Chew 1 tablet (81 mg total) by mouth 2 (two) times daily. 12/07/23 01/18/24  London Sheer, MD  augmented betamethasone dipropionate (DIPROLENE-AF) 0.05 % cream Apply 1 Application topically 2 (two) times daily as needed. 06/30/23   [provider]  benzonatate (TESSALON) 100 MG capsule Take 100 mg by mouth 3 (three) times daily as needed. 07/23/23   [provider]  budesonide (ENTOCORT EC) 3 MG 24 hr capsule Take 3 capsules (9 mg total) by mouth daily. 10/09/23   Hilarie Fredrickson, MD  ciclopirox [facility]) 8 % solution Apply 1 Application topically daily. 10/20/23   [provider]  DULoxetine (CYMBALTA) 60 MG capsule Take 60 mg by mouth daily. 01/18/23   [provider]  escitalopram (LEXAPRO) 20 MG tablet Take 20 mg by mouth daily. 10/12/23   [provider]  famotidine (PEPCID) 40 MG tablet TAKE 1 TABLET BY MOUTH TWICE DAILY  11/05/23   Hilarie Fredrickson, MD  glucose blood [facility]) test strip 1 each by Other route daily. 10/16/14   Artist Pais, Doe-Hyun R, DO  hyoscyamine (ANASPAZ) 0.125 MG TBDP disintergrating tablet Place 1 tablet (0.125 mg total) under the tongue every 4 (four) hours as needed for cramping. 05/05/22   Hilarie Fredrickson, MD  losartan (COZAAR) 100 MG tablet Take 100 mg by mouth daily.    [provider]  metFORMIN (GLUCOPHAGE-XR) 500 MG 24 hr tablet TAKE 1 TABLET(500 MG) BY MOUTH TWICE DAILY WITH A MEAL 06/28/18   Zola Button, Yvonne R, DO  metoprolol succinate (TOPROL-XL) 25 MG 24 hr tablet TAKE 1 TABLET(25 MG) BY MOUTH TWICE DAILY IN ADDITION TO YOUR 50 MG TABLET TO TOTAL 75 MG DAILY 08/11/23   [place], Peter M, MD  metoprolol succinate  (TOPROL-XL) 50 MG 24 hr tablet TAKE WITH OR IMMEDIATELY FOLLOWING A MEAL 06/23/23   [place], Peter M, MD  Misc Natural Products (ESTROVEN ENERGY PO) Take by mouth.    [provider]  omeprazole (PRILOSEC) 40 MG capsule Take 40 mg by mouth every morning. 11/09/23   [provider]  oxyCODONE (OXY IR/ROXICODONE) 5 MG immediate release tablet Take 0.5-1 tablets (2.5-5 mg total) by mouth every 4 (four) hours as needed for up to 7 days for moderate pain (pain score 4-6) or severe pain (pain score 7-10). 12/07/23 12/14/23  London Sheer, MD  polyethylene glycol (MIRALAX / GLYCOLAX) 17 g packet Take 17 g by mouth daily for 14 days. 12/07/23 12/21/23  London Sheer, MD  senna-docusate (SENOKOT-S) 8.6-50 MG tablet Take 1 tablet by mouth 2 (two) times daily for 14 days. 12/07/23 12/21/23  London Sheer, MD  spironolactone (ALDACTONE) 25 MG tablet TAKE 1 AND 1/2 TABLETS(37.5 MG) BY MOUTH DAILY 05/14/23   [place], Peter M, MD      Allergies    Alprazolam, Amlodipine besylate, Aripiprazole, Augmentin [amoxicillin-pot clavulanate], Azithromycin, Bupropion hcl, Cefaclor, Celecoxib, Cephalexin, Ciprofloxacin, Clonidine hydrochloride, Desvenlafaxine, Diphenoxylate-atropine, Divalproex sodium, Doxycycline, Entex, Est estrogens-methyltest, Fluoxetine hcl, Furosemide, Hydrochlorothiazide, Hydrocodone-acetaminophen, Lamotrigine, Lansoprazole, Lisinopril, Lorazepam, Meloxicam, Methylphenidate hcl, Metronidazole, Mirtazapine, Nabumetone, Nitrofuran derivatives, Olmesartan medoxomil, Omeprazole, Other, Oxycodone-acetaminophen, Pantoprazole sodium, Paroxetine, Quetiapine, Rabeprazole sodium, Rofecoxib, Sertraline hcl, Sumatriptan, Telithromycin, Topamax, Tramadol-acetaminophen, Trintellix [vortioxetine], Venlafaxine, Verapamil, and Vilazodone hcl [vilazodone hcl]    Review of Systems   Review of Systems A 10 point review of systems was performed and is negative unless otherwise reported in HPI.  Physical  Exam Updated Vital Signs BP 117/68   Pulse 92   Temp 97.9 F (36.6 C) (Oral)   Resp 14   Ht 5\' 5"  (1.651 m)   Wt 61.2 kg   SpO2 100%   BMI 22.47 kg/m  Physical Exam General: Normal appearing elderly female, lying in bed.  HEENT: Sclera anicteric, very dry mucous membranes, trachea midline.  Cardiology: RRR, no murmurs/rubs/gallops. Resp: Normal respiratory rate and effort. CTAB, no wheezes, rhonchi, crackles.  Abd: Soft, non-tender, non-distended. No rebound tenderness or guarding.  GU: Deferred. MSK: No peripheral edema or signs of trauma. Extremities without deformity or TTP.  Skin: warm, dry.  Back: No CVA tenderness Neuro: A&Ox4, CNs II-XII grossly intact. MAEs. Sensation grossly intact.  Psych: Normal mood and affect.   ED Results / Procedures / Treatments   Labs (all labs ordered are listed, but only abnormal results are displayed) Labs Reviewed  CBC WITH DIFFERENTIAL/PLATELET - Abnormal; Notable for the following components:      Result Value  RBC 2.85 (*)    Hemoglobin 8.5 (*)    HCT 25.8 (*)    RDW 15.6 (*)    All other components within normal limits  COMPREHENSIVE METABOLIC PANEL - Abnormal; Notable for the following components:   Potassium 2.7 (*)    CO2 18 (*)    Glucose, Bld 127 (*)    Calcium 8.2 (*)    Total Protein 6.0 (*)    Albumin 3.4 (*)    AST 73 (*)    ALT 60 (*)    All other components within normal limits  MAGNESIUM - Abnormal; Notable for the following components:   Magnesium 1.3 (*)    All other components within normal limits  RESP PANEL BY RT-PCR (RSV, FLU A&B, COVID)  RVPGX2  GASTROINTESTINAL PANEL BY PCR, STOOL (REPLACES STOOL CULTURE)  C DIFFICILE QUICK SCREEN W PCR REFLEX    RESPIRATORY PANEL BY PCR  ACETAMINOPHEN LEVEL  POC OCCULT BLOOD, ED    EKG EKG Interpretation Date/Time:  Sunday December 13 2023 13:22:01 EST Ventricular Rate:  100 PR Interval:  148 QRS Duration:  149 QT Interval:  395 QTC Calculation: 510 R  Axis:   37  Text Interpretation: Sinus tachycardia Right bundle branch block  P rolonged QTc Confirmed by Vivi Barrack [phone]) on 12/13/2023 2:08:12 PM  Radiology No results found.  Procedures .Critical Care  Performed by: Loetta Rough, MD Authorized by: Loetta Rough, MD   Critical care provider statement:    Critical care time (minutes):  35   Critical care was necessary to treat or prevent imminent or life-threatening deterioration of the following conditions:  Dehydration (electrolyte derangements)   Critical care was time spent personally by me on the following activities:  Development of treatment plan with patient or surrogate, discussions with consultants, evaluation of patient's response to treatment, examination of patient, ordering and review of laboratory studies, ordering and review of radiographic studies, ordering and performing treatments and interventions, pulse oximetry, re-evaluation of patient's condition and review of old charts     Medications Ordered in ED Medications  potassium chloride 10 mEq in 100 mL IVPB (10 mEq Intravenous New Bag/Given 12/13/23 1422)  enoxaparin (LOVENOX) injection 40 mg (has no administration in time range)  sodium chloride flush (NS) 0.9 % injection 3 mL (3 mLs Intravenous Not Given 12/13/23 1519)  acetaminophen (TYLENOL) tablet 650 mg (has no administration in time range)    Or  acetaminophen (TYLENOL) suppository 650 mg (has no administration in time range)  trimethobenzamide (TIGAN) injection 200 mg (has no administration in time range)  albuterol (PROVENTIL) (2.5 MG/3ML) 0.083% nebulizer solution 2.5 mg (has no administration in time range)  calcium gluconate 1 g/ 50 mL sodium chloride IVPB (has no administration in time range)  magnesium sulfate IVPB 1 g 100 mL (has no administration in time range)  potassium chloride SA (KLOR-CON M) CR tablet 40 mEq (40 mEq Oral Given 12/13/23 1425)  magnesium oxide (MAG-OX) tablet 400 mg (400  mg Oral Given 12/13/23 1425)  lactated ringers bolus 1,000 mL (1,000 mLs Intravenous New Bag/Given 12/13/23 1423)    ED Course/ Medical Decision Making/ A&P                          Medical Decision Making Amount and/or Complexity of Data Reviewed Labs: ordered. Decision-making details documented in ED Course.  Risk OTC drugs. Prescription drug management. Decision regarding hospitalization.    This patient presents to  the ED for concern of diarrhea, generalized weakness, this involves an extensive number of treatment options, and is a complaint that carries with it a high risk of complications and morbidity.  I considered the following differential and admission for this acute, potentially life threatening condition.   MDM:    For ddx of acute diarrhea, consider:  -Bacterial: salmonella, ETEC, campylobacter, C diff, shigella.  Patient does have risk factors for C. difficile including recent hospitalization and surgery.  Reassuringly fecal occult is negative for blood.  Will obtain stool cultures. -Viral causes such as gastroenteritis d/t rotavirus or norovirus.  Less likely parasitic cause. -She has no abdominal pain or any abdominal tenderness to palpation to indicate noninfectious causes include intraabdominal infections such as appendicitis or diverticulitis; bowel malfunctions such as SBO, or mesenteric ischemia.  -Patient is noted to have electrolyte derangements.  EKG in the setting of hypokalemia does demonstrate newly prolonged QTc.  Will replete both potassium and magnesium. -Patient is noted to have very mild transaminitis.  Again no abdominal pain.  No history of any liver disease.  No hyperbilirubinemia to indicate obstructive cause.  She does endorse Tylenol consumption but appears to be taking it appropriately, will obtain Tylenol level to ensure. -Patient is very dry on exam and will give 1 L fluid resuscitation  Clinical Course as of 12/13/23 1537  Sun Dec 13, 2023   1318 Potassium(!!): 2.7 +hypokalemia, d/t diarrhea, will replete [HN]  1408 Magnesium(!): 1.3 +hypomagnesemia [HN]  1410 AST(!): 73 [HN]  1410 ALT(!): 60 Mild new transaminitis as well [HN]  1422 Hemoglobin(!): 8.5 Stable anemia [HN]  1452 Fecal Occult Blood, POC: NEGATIVE neg [HN]    Clinical Course User Index [HN] Loetta Rough, MD    Labs: I Ordered, and personally interpreted labs.  The pertinent results include: Those listed above  Additional history obtained from chart review.    Cardiac Monitoring: The patient was maintained on a cardiac monitor.  I personally viewed and interpreted the cardiac monitored which showed an underlying rhythm of: Normal sinus rhythm  Reevaluation: After the interventions noted above, I reevaluated the patient and found that they have :stayed the same  Social Determinants of Health:  lives independently  Disposition: Admitted to hospitalist  Co morbidities that complicate the patient evaluation  Past Medical History:  Diagnosis Date   Allergy    Anxiety    Anxiety and depression    ? bipolar   Cataract    bil cataracts removed   Depression    Diabetes mellitus, type 2 (HCC)    Dysrhythmia    GERD (gastroesophageal reflux disease)    Heart murmur    Hemorrhoids 11/24/2010   Hiatal hernia    Hypertension    IBS (irritable bowel syndrome)    Lymphocytic colitis 11/24/2010   Mumerous medication allergies and sensitivities 07/20/2015   Osteoarthritis (arthritis due to wear and tear of joints)    Segmental colitis (HCC)    iscemic colitis 2009   Stroke [facility])    states 2 "'mini strokes" 1980   TIA (transient ischemic attack) 11/24/1998   x 3      Medicines Meds ordered this encounter  Medications   potassium chloride SA (KLOR-CON M) CR tablet 40 mEq   potassium chloride 10 mEq in 100 mL IVPB   magnesium oxide (MAG-OX) tablet 400 mg   lactated ringers bolus 1,000 mL   enoxaparin (LOVENOX) injection 40 mg   sodium  chloride flush (NS) 0.9 % injection 3 mL  OR Linked Order Group    acetaminophen (TYLENOL) tablet 650 mg    acetaminophen (TYLENOL) suppository 650 mg   trimethobenzamide (TIGAN) injection 200 mg   albuterol (PROVENTIL) (2.5 MG/3ML) 0.083% nebulizer solution 2.5 mg   calcium gluconate 1 g/ 50 mL sodium chloride IVPB   magnesium sulfate IVPB 1 g 100 mL    I have reviewed the patients home medicines and have made adjustments as needed  Problem List / ED Course: Problem List Items Addressed This Visit   None Visit Diagnoses       Nausea vomiting and diarrhea    -  Primary     Dehydration         Hypokalemia         Hypomagnesemia         Transaminitis         Prolonged Q-T interval on ECG                       This note was created using dictation software, which may contain spelling or grammatical errors.    Loetta Rough, MD 12/13/23 [phone]

## 2023-12-13 NOTE — H&P (Signed)
History and Physical    Patient: Cynthia Gilmore ZOX:[id number] DOB: Jun 25, 1934 DOA: 12/13/2023 DOS: the patient was seen and examined on 12/13/2023 PCP: Ollen Bowl, MD  Patient coming from: Home via EMS  Chief Complaint:  Chief Complaint  Patient presents with   Diarrhea   HPI: Cynthia Gilmore is a 89 y.o. female with medical history significant of hypertension, TIA, diabetes mellitus type 2, irritable bowel syndrome, history of colitis, anxiety, depression, and GERD presents with signs of nausea, vomiting, and diarrhea.  She just recently been hospitalized from 1/10-/15 after to have a left femur fracture for which she underwent intramedullary nailing on 1/11.  Two days after discharge, she began experiencing nausea, vomiting, and diarrhea. The vomiting ceased yesterday, but the diarrhea persisted. She reported some abdominal discomfort, described as 'griping pain,' but not severe. She denied taking any stool softeners after the onset of these symptoms, although she had been using them initially post-surgery due to a six-day period of constipation.  In addition to her gastrointestinal symptoms, Cynthia Gilmore reported pain in her left knee, which was bruised and sensitive to touch. She had not experienced any further falls since her discharge from the hospital. Mobility was limited, with weight-bearing on the left leg as tolerated, and she was using a walker for assistance. She denied any feelings of lightheadedness or dizziness since the onset of her gastrointestinal symptoms.  Cynthia Gilmore's family members which included her husband and her daughter, who had been caring for her, also fell ill with similar symptoms.  The patient's husband also came to the hospital due to his symptoms, but note that his symptoms were more severe and that they had to intubate him.  In the emergency department patient was noted to be afebrile with relatively stable vital signs.  Labs significant for  hemoglobin 8.5, potassium 2.7, CO2 18, BUN 16, creatinine 0.69, calcium 8.2, magnesium 1.3, AST 73, ALT 60 total bilirubin 1.  Influenza, COVID-19, RSV screening were negative.  Stool guaiacs noted negative.  C. difficile and GI panel have been ordered and pending.  Patient has been given 1 L lactated Ringer's, magnesium oxide 400 mg p.o., potassium chloride 20 mEq IV and 40 mEq p.o.  Review of Systems: As mentioned in the history of present illness. All other systems reviewed and are negative. Past Medical History:  Diagnosis Date   Allergy    Anxiety    Anxiety and depression    ? bipolar   Cataract    bil cataracts removed   Depression    Diabetes mellitus, type 2 (HCC)    Dysrhythmia    GERD (gastroesophageal reflux disease)    Heart murmur    Hemorrhoids 11/24/2010   Hiatal hernia    Hypertension    IBS (irritable bowel syndrome)    Lymphocytic colitis 11/24/2010   Mumerous medication allergies and sensitivities 07/20/2015   Osteoarthritis (arthritis due to wear and tear of joints)    Segmental colitis (HCC)    iscemic colitis 2009   Stroke [facility])    states 2 "'mini strokes" 1980   TIA (transient ischemic attack) 11/24/1998   x 3    Past Surgical History:  Procedure Laterality Date   ANKLE SURGERY  1999   right ankle fracture surgery   BLADDER REPAIR  1995   CATARACT EXTRACTION, BILATERAL     CHOLECYSTECTOMY  2004   COLONOSCOPY W/ BIOPSIES AND POLYPECTOMY   07/29/2011   internal hemorrhoids, lymphocytic colitis   ESOPHAGEAL  MANOMETRY     Normal   ESOPHAGOGASTRODUODENOSCOPY  07/29/2011   gastritis,hiatal hernia, tortuous esophagus   EUS  06/03/2012   Procedure: UPPER ENDOSCOPIC ULTRASOUND (EUS) LINEAR;  Surgeon: Rachael Fee, MD;  Location: [facility];  Service: Endoscopy;  Laterality: N/A;   HERNIA REPAIR  1995   INTRAMEDULLARY (IM) NAIL INTERTROCHANTERIC Left 12/05/2023   Procedure: INTRAMEDULLARY (IM) NAIL INTERTROCHANTERIC;  Surgeon: London Sheer, MD;   Location: [facility] OR;  Service: Orthopedics;  Laterality: Left;   PARTIAL HYSTERECTOMY  1992   partial   Social History:  reports that she has quit smoking. Her smoking use included cigarettes. She has never used smokeless tobacco. She reports that she does not drink alcohol and does not use drugs.  Allergies  Allergen Reactions   Alprazolam     REACTION: sleepy   Amlodipine Besylate     REACTION: swelling   Aripiprazole    Augmentin [Amoxicillin-Pot Clavulanate] Diarrhea   Azithromycin    Bupropion Hcl     REACTION: insomnia   Cefaclor     REACTION: mouth blisters   Celecoxib     REACTION: "drained"   Cephalexin    Ciprofloxacin    Clonidine Hydrochloride     REACTION: headache, dizziness   Desvenlafaxine    Diphenoxylate-Atropine    Divalproex Sodium     REACTION: nausea,tremors,fatigue   Doxycycline    Entex     REACTION: nausea, headache   Est Estrogens-Methyltest     REACTION: headache   Fluoxetine Hcl    Furosemide    Hydrochlorothiazide    Hydrocodone-Acetaminophen     REACTION: headache   Lamotrigine     REACTION: mood swing,chills, nausea   Lansoprazole    Lisinopril     REACTION: cough   Lorazepam    Meloxicam     REACTION: nausea, diarrhea   Methylphenidate Hcl    Metronidazole    Mirtazapine     REACTION: swelling   Nabumetone     REACTION: nausea, headache   Nitrofuran Derivatives Nausea Only   Olmesartan Medoxomil    Omeprazole Diarrhea   Other     Alum and Mag Hydroxide Simeth(Solution) GI Cocktail. Diarrhea    Oxycodone-Acetaminophen    Pantoprazole Sodium    Paroxetine     REACTION: tremor, insomnia, nausea   Quetiapine    Rabeprazole Sodium     REACTION: diarrhea   Rofecoxib    Sertraline Hcl     REACTION: exreme confusion   Sumatriptan     REACTION: cardiologist says "no"   Telithromycin     REACTION: "drained"   Topamax     Confusion and mood swings   Tramadol-Acetaminophen    Trintellix [Vortioxetine] Other (See Comments)     "Made things worst"   Venlafaxine     REACTION: hypertension, nausea, headache   Verapamil     Headaches and hot flashes    Vilazodone Hcl [Vilazodone Hcl]     Family History  Problem Relation Age of Onset   Diabetes Mother    Heart disease Father    Heart attack Father    Diabetes Maternal Aunt    Diabetes Maternal Uncle    Heart disease Paternal Aunt    Colon cancer Neg Hx    Esophageal cancer Neg Hx    Stomach cancer Neg Hx     Prior to Admission medications   Medication Sig Start Date End Date Taking? Authorizing Provider  acetaminophen (TYLENOL) 500 MG tablet Take 2 tablets (1,000 mg  total) by mouth every 8 (eight) hours for 14 days. 12/07/23 12/21/23  London Sheer, MD  ALPRAZolam Prudy Feeler) 0.25 MG tablet TAKE 1/2 TO 1 TABLET BY MOUTH ONCE DAILY AS NEEDED 03/23/17   Zola Button, Grayling Congress, DO  anti-nausea (EMETROL) solution Take 5 mLs by mouth as needed for nausea or vomiting.    [provider]  Ascorbic Acid (VITAMIN C) 500 MG CAPS Take by mouth.    [provider]  aspirin 81 MG chewable tablet Chew 1 tablet (81 mg total) by mouth 2 (two) times daily. 12/07/23 01/18/24  London Sheer, MD  augmented betamethasone dipropionate (DIPROLENE-AF) 0.05 % cream Apply 1 Application topically 2 (two) times daily as needed. 06/30/23   [provider]  benzonatate (TESSALON) 100 MG capsule Take 100 mg by mouth 3 (three) times daily as needed. 07/23/23   [provider]  budesonide (ENTOCORT EC) 3 MG 24 hr capsule Take 3 capsules (9 mg total) by mouth daily. 10/09/23   Hilarie Fredrickson, MD  ciclopirox [facility]) 8 % solution Apply 1 Application topically daily. 10/20/23   [provider]  DULoxetine (CYMBALTA) 60 MG capsule Take 60 mg by mouth daily. 01/18/23   [provider]  escitalopram (LEXAPRO) 20 MG tablet Take 20 mg by mouth daily. 10/12/23   [provider]  famotidine (PEPCID) 40 MG tablet TAKE 1 TABLET BY MOUTH TWICE DAILY  11/05/23   Hilarie Fredrickson, MD  glucose blood (ACCU-CHEK SMARTVIEW) test strip 1 each by Other route daily. 10/16/14   Artist Pais, Doe-Hyun R, DO  hyoscyamine (ANASPAZ) 0.125 MG TBDP disintergrating tablet Place 1 tablet (0.125 mg total) under the tongue every 4 (four) hours as needed for cramping. 05/05/22   Hilarie Fredrickson, MD  losartan (COZAAR) 100 MG tablet Take 100 mg by mouth daily.    [provider]  metFORMIN (GLUCOPHAGE-XR) 500 MG 24 hr tablet TAKE 1 TABLET(500 MG) BY MOUTH TWICE DAILY WITH A MEAL 06/28/18   Zola Button, Yvonne R, DO  metoprolol succinate (TOPROL-XL) 25 MG 24 hr tablet TAKE 1 TABLET(25 MG) BY MOUTH TWICE DAILY IN ADDITION TO YOUR 50 MG TABLET TO TOTAL 75 MG DAILY 08/11/23   [place], Peter M, MD  metoprolol succinate (TOPROL-XL) 50 MG 24 hr tablet TAKE WITH OR IMMEDIATELY FOLLOWING A MEAL 06/23/23   [place], Peter M, MD  Misc Natural Products (ESTROVEN ENERGY PO) Take by mouth.    [provider]  omeprazole (PRILOSEC) 40 MG capsule Take 40 mg by mouth every morning. 11/09/23   [provider]  oxyCODONE (OXY IR/ROXICODONE) 5 MG immediate release tablet Take 0.5-1 tablets (2.5-5 mg total) by mouth every 4 (four) hours as needed for up to 7 days for moderate pain (pain score 4-6) or severe pain (pain score 7-10). 12/07/23 12/14/23  London Sheer, MD  polyethylene glycol (MIRALAX / GLYCOLAX) 17 g packet Take 17 g by mouth daily for 14 days. 12/07/23 12/21/23  London Sheer, MD  senna-docusate (SENOKOT-S) 8.6-50 MG tablet Take 1 tablet by mouth 2 (two) times daily for 14 days. 12/07/23 12/21/23  London Sheer, MD  spironolactone (ALDACTONE) 25 MG tablet TAKE 1 AND 1/2 TABLETS(37.5 MG) BY MOUTH DAILY 05/14/23   [place], Peter M, MD    Physical Exam: Vitals:   12/13/23 1154 12/13/23 1200 12/13/23 1300 12/13/23 1330  BP:  (!) 123/55 (!) 120/48 117/68  Pulse: 88 94 89 92  Resp:  11 (!) 22 14  Temp:  TempSrc:      SpO2: 100% 100% 100% 100%  Weight:       Height:       Constitutional: Elderly female who appears unwell but in no acute distress and able to follow commands Eyes: PERRL, lids and conjunctivae normal ENMT: Mucous membranes are moist.  Normal dentition.  Neck: normal, supple,   Respiratory: clear to auscultation bilaterally, no wheezing, no crackles. Normal respiratory effort. No accessory muscle use.  Cardiovascular: Regular rate and rhythm, no murmurs / rubs / gallops. No extremity edema. 2+ pedal pulses. No carotid bruits.  Abdomen: no tenderness, no masses palpated.  Bowel sounds positive.  Musculoskeletal: no clubbing / cyanosis.   Skin: Significant bruising noted of the left leg without significant signs of erythema surrounding surgical site on the lateral aspect of left thigh. Neurologic: CN 2-12 grossly intact. Strength 5/5 in all 4.  Psychiatric: Normal judgment and insight. Alert and oriented x 3. Normal mood.   Data Reviewed:  EKG reveals sinus tachycardia at 100 bpm with QTc 510.  Reviewed labs, imaging, and pertinent records as documented  Assessment and Plan:  Nausea, vomiting, and diarrhea secondary to suspected gastroenteritis History of IBS Acute.  Patient presents with at least 2-day history of diarrhea with nausea and vomiting.  Patient makes note that vomiting had resolved  yesterday.  At baseline patient has a history of IBS.  Influenza, COVID-19, and RSV screening were negative.  C. difficile panel was also negative.  GI panel was still pending.  She notes that has been an daughter also came down with similar symptoms.  Patient had been given 1 L bolus of IV fluids.  Suspect a possible viral gastroenteritis as the cause. -Admit to a medical telemetry bed -Enteric precautions -Aspiration precautions with elevation head of bed -Monitor intake and output -Diet as tolerated -Follow-up pending studies includes -Check acute abdominal series -Continue lactated Ringer's at 100 mL/h for 1  L  Hypokalemia Hypomagnesemia Hypocalcemia Acute.  On admission potassium  2.7, calcium 8.2, and magnesium 1.3. -Give calcium gluconate 1 g IV -Given additional  magnesium sulfate 1 g IV -Recheck electrolytes and replace as needed  Recent left femur fracture status post intramedullary nailing Patient suffered a left femur fracture after fall and underwent intramedullary nailing on 1/11.  Patient has significant bruising of the left leg and appears to be resolving and full surgical wound does not appear to have any erythema surrounding -Continue oxycodone as needed -Physical therapy to evaluate and treat  Essential hypertension Blood pressures currently maintained 117/68 to 131/60. -Continue metoprolol and losartan in a.m. -Held spironolactone due to diarrhea and fluid losses  Diabetes mellitus type 2, without long-term use of insulin Glucose was noted to be 127 on admission.  Last hemoglobin A1c was 6.1 when checked on 12/05/2023. -Hold metformin -Unclear indication for serial CBGs or sliding scale of insulin at this time.  Anxiety and depression -Continue Cymbalta, Lexapro, and Ativan as needed  Normocytic anemia Chronic.  Hemoglobin 8.5 which is appears similar to baseline. -Recheck H&H  Prolonged QT interval Acute.  QTc was noted to be prolonged at 510. -Correcting electrolyte abnormalities -Recheck EKG  Transaminitis Acute.  Labs noted AST  73 and ALT 60 with total bilirubin within normal limits.  Possibly related with fluid losses. -Recheck CMP tomorrow  GERD -Continue Protonix  DVT prophylaxis: Lovenox Advance Care Planning:   Code Status: Full Code    Consults: None  Family Communication: Daughter updated at bedside  Severity of Illness:  The appropriate patient status for this patient is OBSERVATION. Observation status is judged to be reasonable and necessary in order to provide the required intensity of service to ensure the patient's safety. The patient's  presenting symptoms, physical exam findings, and initial radiographic and laboratory data in the context of their medical condition is felt to place them at decreased risk for further clinical deterioration. Furthermore, it is anticipated that the patient will be medically stable for discharge from the hospital within 2 midnights of admission.   Author: Clydie Braun, MD 12/13/2023 2:50 PM  For on call review [email or web address].

## 2023-12-13 NOTE — ED Notes (Signed)
ED TO INPATIENT HANDOFF REPORT  ED Nurse Name and Phone #:   S Name/Age/Gender Cynthia Gilmore 89 y.o. female Room/Bed: 004C/004C  Code Status   Code Status: Full Code  Home/SNF/Other Home Patient oriented to: self, place, time, and situation Is this baseline? Yes   Triage Complete: Triage complete  Chief Complaint Diarrhea [R19.7]  Triage Note Pt BIB [facility] from home d/t diarrhea that has been going on for th past 2 days. Also had vomiting but has stopped.  Pt had hip surgery done last Saturday . No signs of infection at the incision site. Per paramedic, pt's husband also has the symptoms. VSS. Afebrile. A&O X4.   Had 500 ml fluids w EMS.    Allergies Allergies  Allergen Reactions   Alprazolam     REACTION: sleepy   Amlodipine Besylate     REACTION: swelling   Aripiprazole    Augmentin [Amoxicillin-Pot Clavulanate] Diarrhea   Azithromycin    Bupropion Hcl     REACTION: insomnia   Cefaclor     REACTION: mouth blisters   Celecoxib     REACTION: "drained"   Cephalexin    Ciprofloxacin    Clonidine Hydrochloride     REACTION: headache, dizziness   Desvenlafaxine    Diphenoxylate-Atropine    Divalproex Sodium     REACTION: nausea,tremors,fatigue   Doxycycline    Entex     REACTION: nausea, headache   Est Estrogens-Methyltest     REACTION: headache   Fluoxetine Hcl    Furosemide    Hydrochlorothiazide    Hydrocodone-Acetaminophen     REACTION: headache   Lamotrigine     REACTION: mood swing,chills, nausea   Lansoprazole    Lisinopril     REACTION: cough   Lorazepam    Meloxicam     REACTION: nausea, diarrhea   Methylphenidate Hcl    Metronidazole    Mirtazapine     REACTION: swelling   Nabumetone     REACTION: nausea, headache   Nitrofuran Derivatives Nausea Only   Olmesartan Medoxomil    Omeprazole Diarrhea   Other     Alum and Mag Hydroxide Simeth(Solution) GI Cocktail. Diarrhea    Oxycodone-Acetaminophen    Pantoprazole Sodium     Paroxetine     REACTION: tremor, insomnia, nausea   Quetiapine    Rabeprazole Sodium     REACTION: diarrhea   Rofecoxib    Sertraline Hcl     REACTION: exreme confusion   Sumatriptan     REACTION: cardiologist says "no"   Telithromycin     REACTION: "drained"   Topamax     Confusion and mood swings   Tramadol-Acetaminophen    Trintellix [Vortioxetine] Other (See Comments)    "Made things worst"   Venlafaxine     REACTION: hypertension, nausea, headache   Verapamil     Headaches and hot flashes    Vilazodone Hcl [Vilazodone Hcl]     Level of Care/Admitting Diagnosis ED Disposition     ED Disposition  Admit   Condition  --   Comment  Hospital Area: [facility] [[id number]]  Level of Care: Telemetry Medical [104]  May place patient in observation at [facility] or [facility] if equivalent level of care is available:: No  Covid Evaluation: Asymptomatic - no recent exposure (last 10 days) testing not required  Diagnosis: Diarrhea [787.91.ICD-9-CM]  Admitting Physician: Clydie Braun [[id number]]  Attending Physician: Clydie Braun [[id number]]  B Medical/Surgery History Past Medical History:  Diagnosis Date   Allergy    Anxiety    Anxiety and depression    ? bipolar   Cataract    bil cataracts removed   Depression    Diabetes mellitus, type 2 (HCC)    Dysrhythmia    GERD (gastroesophageal reflux disease)    Heart murmur    Hemorrhoids 11/24/2010   Hiatal hernia    Hypertension    IBS (irritable bowel syndrome)    Lymphocytic colitis 11/24/2010   Mumerous medication allergies and sensitivities 07/20/2015   Osteoarthritis (arthritis due to wear and tear of joints)    Segmental colitis (HCC)    iscemic colitis 2009   Stroke [facility])    states 2 "'mini strokes" 1980   TIA (transient ischemic attack) 11/24/1998   x 3    Past Surgical History:  Procedure Laterality Date   ANKLE SURGERY  1999   right ankle fracture surgery   BLADDER  REPAIR  1995   CATARACT EXTRACTION, BILATERAL     CHOLECYSTECTOMY  2004   COLONOSCOPY W/ BIOPSIES AND POLYPECTOMY   07/29/2011   internal hemorrhoids, lymphocytic colitis   ESOPHAGEAL MANOMETRY     Normal   ESOPHAGOGASTRODUODENOSCOPY  07/29/2011   gastritis,hiatal hernia, tortuous esophagus   EUS  06/03/2012   Procedure: UPPER ENDOSCOPIC ULTRASOUND (EUS) LINEAR;  Surgeon: Rachael Fee, MD;  Location: [facility] END[facility];  Service: Endoscopy;  Laterality: N/A;   HERNIA REPAIR  1995   INTRAMEDULLARY (IM) NAIL INTERTROCHANTERIC Left 12/05/2023   Procedure: INTRAMEDULLARY (IM) NAIL INTERTROCHANTERIC;  Surgeon: London Sheer, MD;  Location: [facility] OR;  Service: Orthopedics;  Laterality: Left;   PARTIAL HYSTERECTOMY  1992   partial     A IV Location/Drains/Wounds Patient Lines/Drains/Airways Status     Active Line/Drains/Airways     Name Placement date Placement time Site Days   Peripheral IV 12/13/23 20 G Right Antecubital 12/13/23  --  Antecubital  less than 1            Intake/Output Last 24 hours  Intake/Output Summary (Last 24 hours) at 12/13/2023 1927 Last data filed at 12/13/2023 1713 Gross per 24 hour  Intake 1315.47 ml  Output --  Net 1315.47 ml    Labs/Imaging Results for orders placed or performed during the hospital encounter of 12/13/23 (from the past 48 hours)  CBC with Differential     Status: Abnormal   Collection Time: 12/13/23 11:59 AM  Result Value Ref Range   WBC 6.8 4.0 - 10.5 K/uL   RBC 2.85 (L) 3.87 - 5.11 MIL/uL   Hemoglobin 8.5 (L) 12.0 - 15.0 g/dL   HCT 65.7 (L) 84.6 - 96.2 %   MCV 90.5 80.0 - 100.0 fL   MCH 29.8 26.0 - 34.0 pg   MCHC 32.9 30.0 - 36.0 g/dL   RDW 95.2 (H) 84.1 - 32.4 %   Platelets 379 150 - 400 K/uL   nRBC 0.0 0.0 - 0.2 %   Neutrophils Relative % 63 %   Neutro Abs 4.2 1.7 - 7.7 K/uL   Lymphocytes Relative 24 %   Lymphs Abs 1.6 0.7 - 4.0 K/uL   Monocytes Relative 12 %   Monocytes Absolute 0.8 0.1 - 1.0 K/uL   Eosinophils Relative 0  %   Eosinophils Absolute 0.0 0.0 - 0.5 K/uL   Basophils Relative 0 %   Basophils Absolute 0.0 0.0 - 0.1 K/uL   Immature Granulocytes 1 %   Abs Immature Granulocytes  0.06 0.00 - 0.07 K/uL    Comment: Performed at [facility], [address], [place], [place] [address]  Comprehensive metabolic panel     Status: Abnormal   Collection Time: 12/13/23 11:59 AM  Result Value Ref Range   Sodium 138 135 - 145 mmol/L   Potassium 2.7 (LL) 3.5 - 5.1 mmol/L    Comment: CRITICAL RESULT CALLED TO, READ BACK BY AND VERIFIED WITH K,BRANCH P.EMT @1254  12/13/23 E,BENTON   Chloride 107 98 - 111 mmol/L   CO2 18 (L) 22 - 32 mmol/L   Glucose, Bld 127 (H) 70 - 99 mg/dL    Comment: Glucose reference range applies only to samples taken after fasting for at least 8 hours.   BUN 16 8 - 23 mg/dL   Creatinine, Ser 1.91 0.44 - 1.00 mg/dL   Calcium 8.2 (L) 8.9 - 10.3 mg/dL   Total Protein 6.0 (L) 6.5 - 8.1 g/dL   Albumin 3.4 (L) 3.5 - 5.0 g/dL   AST 73 (H) 15 - 41 U/L   ALT 60 (H) 0 - 44 U/L   Alkaline Phosphatase 53 38 - 126 U/L   Total Bilirubin 1.0 0.0 - 1.2 mg/dL   GFR, Estimated >47 >82 mL/min    Comment: (NOTE) Calculated using the CKD-EPI Creatinine Equation (2021)    Anion gap 13 5 - 15    Comment: Performed at [facility], [address] [place], [place] [address]  Magnesium     Status: Abnormal   Collection Time: 12/13/23 11:59 AM  Result Value Ref Range   Magnesium 1.3 (L) 1.7 - 2.4 mg/dL    Comment: Performed at [facility], [address] [place], [place] [address]  Resp panel by RT-PCR (RSV, Flu A&B, Covid) Anterior Nasal Swab     Status: None   Collection Time: 12/13/23 11:59 AM   Specimen: Anterior Nasal Swab  Result Value Ref Range   SARS Coronavirus 2 by RT PCR NEGATIVE NEGATIVE   Influenza A by PCR NEGATIVE NEGATIVE   Influenza B by PCR NEGATIVE NEGATIVE    Comment: (NOTE) The Xpert Xpress SARS-CoV-2/FLU/RSV plus assay is intended as an aid in the diagnosis of  influenza from Nasopharyngeal swab specimens and should not be used as a sole basis for treatment. Nasal washings and aspirates are unacceptable for Xpert Xpress SARS-CoV-2/FLU/RSV testing.  Fact Sheet for Patients: BloggerCourse.com  Fact Sheet for Healthcare Providers: SeriousBroker.it  This test is not yet approved or cleared by the [place] FDA and has been authorized for detection and/or diagnosis of SARS-CoV-2 by FDA under an Emergency Use Authorization (EUA). This EUA will remain in effect (meaning this test can be used) for the duration of the COVID-19 declaration under Section 564(b)(1) of the Act, 21 U.S.C. section 360bbb-3(b)(1), unless the authorization is terminated or revoked.     Resp Syncytial Virus by PCR NEGATIVE NEGATIVE    Comment: (NOTE) Fact Sheet for Patients: BloggerCourse.com  Fact Sheet for Healthcare Providers: SeriousBroker.it  This test is not yet approved or cleared by the [place] FDA and has been authorized for detection and/or diagnosis of SARS-CoV-2 by FDA under an Emergency Use Authorization (EUA). This EUA will remain in effect (meaning this test can be used) for the duration of the COVID-19 declaration under Section 564(b)(1) of the Act, 21 U.S.C. section 360bbb-3(b)(1), unless the authorization is terminated or revoked.  Performed at [facility], [address] [place], [place] [address]  C Difficile Quick Screen w PCR reflex     Status: None   Collection Time: 12/13/23  2:10 PM   Specimen: Stool  Result Value Ref Range   C Diff antigen NEGATIVE NEGATIVE   C Diff toxin NEGATIVE NEGATIVE   C Diff interpretation No C. difficile detected.     Comment: Performed at [facility], [address] [place], [place] [address]  POC occult blood, ED RN will collect     Status: None   Collection Time: 12/13/23  2:50 PM   Result Value Ref Range   Fecal Occult Bld NEGATIVE NEGATIVE  Acetaminophen level     Status: Abnormal   Collection Time: 12/13/23  5:06 PM  Result Value Ref Range   Acetaminophen (Tylenol), Serum <10 (L) 10 - 30 ug/mL    Comment: (NOTE) Therapeutic concentrations vary significantly. A range of 10-30 ug/mL  may be an effective concentration for many patients. However, some  are best treated at concentrations outside of this range. Acetaminophen concentrations >150 ug/mL at 4 hours after ingestion  and >50 ug/mL at 12 hours after ingestion are often associated with  toxic reactions.  Performed at [facility], [address] [place], [place] [address]    DG ABD ACUTE 2+V W 1V CHEST Result Date: 12/13/2023 CLINICAL DATA:  Nausea, vomiting, diarrhea. EXAM: DG ABDOMEN ACUTE WITH 1 VIEW CHEST COMPARISON:  Chest radiograph dated 12/04/2023. FINDINGS: There is no evidence of dilated bowel loops or free intraperitoneal air. Gas overlies the rectum. No radiopaque calculi are seen overlying the expected course of the urinary tract. Degenerative changes are seen in the spine and hips. Fixation hardware is seen in the left femur. Heart size and mediastinal contours are within normal limits. Insert vascular both lungs are clear. IMPRESSION: 1. Nonobstructive bowel gas pattern. 2. No acute cardiopulmonary process. Electronically Signed   By: Romona Curls M.D.   On: 12/13/2023 16:15    Pending Labs Unresulted Labs (From admission, onward)     Start     Ordered   12/14/23 0500  CBC  Tomorrow morning,   R        12/13/23 1503   12/14/23 0500  Comprehensive metabolic panel  Tomorrow morning,   R        12/13/23 1503   12/14/23 0500  Magnesium  Tomorrow morning,   R        12/13/23 1817   12/13/23 1823  Respiratory (~20 pathogens) panel by PCR  (Respiratory panel by PCR (~20 pathogens, ~24 hr TAT)  w precautions)  Add-on,   AD       Comments: Due to nausea, vomiting, diarrhea    12/13/23  1822   12/13/23 1410  Gastrointestinal Panel by PCR , Stool  (Gastrointestinal Panel by PCR, Stool                                                                                                                                                     **  Does Not include CLOSTRIDIUM DIFFICILE testing. **If CDIFF testing is needed, place order from the "C Difficile Testing" order set.**)  Once,   URGENT        12/13/23 1409            Vitals/Pain Today's Vitals   12/13/23 1200 12/13/23 1300 12/13/23 1330 12/13/23 1619  BP: (!) 123/55 (!) 120/48 117/68   Pulse: 94 89 92   Resp: 11 (!) 22 14   Temp:    98.2 F (36.8 C)  TempSrc:    Oral  SpO2: 100% 100% 100%   Weight:      Height:      PainSc:        Isolation Precautions Enteric precautions (UV disinfection)  Medications Medications  enoxaparin (LOVENOX) injection 40 mg (40 mg Subcutaneous Given 12/13/23 1614)  sodium chloride flush (NS) 0.9 % injection 3 mL (3 mLs Intravenous Not Given 12/13/23 1519)  acetaminophen (TYLENOL) tablet 650 mg (has no administration in time range)    Or  acetaminophen (TYLENOL) suppository 650 mg (has no administration in time range)  trimethobenzamide (TIGAN) injection 200 mg (has no administration in time range)  albuterol (PROVENTIL) (2.5 MG/3ML) 0.083% nebulizer solution 2.5 mg (has no administration in time range)  oxyCODONE (Oxy IR/ROXICODONE) immediate release tablet 2.5-5 mg (has no administration in time range)  losartan (COZAAR) tablet 100 mg (has no administration in time range)  ALPRAZolam (XANAX) tablet 0.25 mg (has no administration in time range)  DULoxetine (CYMBALTA) DR capsule 60 mg (has no administration in time range)  escitalopram (LEXAPRO) tablet 20 mg (has no administration in time range)  hyoscyamine (ANASPAZ) disintergrating tablet 0.125 mg (has no administration in time range)  pantoprazole (PROTONIX) EC tablet 40 mg (has no administration in time range)  lactated ringers  infusion (has no administration in time range)  metoprolol succinate (TOPROL-XL) 24 hr tablet 75 mg (has no administration in time range)  potassium chloride SA (KLOR-CON M) CR tablet 40 mEq (40 mEq Oral Given 12/13/23 1425)  potassium chloride 10 mEq in 100 mL IVPB (0 mEq Intravenous Stopped 12/13/23 1709)  magnesium oxide (MAG-OX) tablet 400 mg (400 mg Oral Given 12/13/23 1425)  lactated ringers bolus 1,000 mL (0 mLs Intravenous Stopped 12/13/23 1640)  calcium gluconate 1 g/ 50 mL sodium chloride IVPB (0 mg Intravenous Stopped 12/13/23 1713)  magnesium sulfate IVPB 1 g 100 mL (0 g Intravenous Stopped 12/13/23 1710)    Mobility walks with person assist     Focused Assessments    R Recommendations: See Admitting Provider Note  Report given to:   Additional Notes:

## 2023-12-13 NOTE — ED Triage Notes (Signed)
Pt BIB [facility] from home d/t diarrhea that has been going on for th past 2 days. Also had vomiting but has stopped.  Pt had hip surgery done last Saturday . No signs of infection at the incision site. Per paramedic, pt's husband also has the symptoms. VSS. Afebrile. A&O X4.   Had 500 ml fluids w EMS.

## 2023-12-14 DIAGNOSIS — R112 Nausea with vomiting, unspecified: Secondary | ICD-10-CM | POA: Diagnosis not present

## 2023-12-14 DIAGNOSIS — R197 Diarrhea, unspecified: Secondary | ICD-10-CM | POA: Diagnosis not present

## 2023-12-14 LAB — GASTROINTESTINAL PANEL BY PCR, STOOL (REPLACES STOOL CULTURE)

## 2023-12-14 LAB — COMPREHENSIVE METABOLIC PANEL
ALT: 43 U/L (ref 0–44)
AST: 44 U/L — ABNORMAL HIGH (ref 15–41)
Albumin: 2.8 g/dL — ABNORMAL LOW (ref 3.5–5.0)
Alkaline Phosphatase: 46 U/L (ref 38–126)
Anion gap: 8 (ref 5–15)
BUN: 10 mg/dL (ref 8–23)
CO2: 22 mmol/L (ref 22–32)
Calcium: 8.4 mg/dL — ABNORMAL LOW (ref 8.9–10.3)
Chloride: 111 mmol/L (ref 98–111)
Creatinine, Ser: 0.56 mg/dL (ref 0.44–1.00)
GFR, Estimated: >60 mL/min (ref >60)
Glucose, Bld: 107 mg/dL — ABNORMAL HIGH (ref 70–99)
Potassium: 3.3 mmol/L — ABNORMAL LOW (ref 3.5–5.1)
Sodium: 141 mmol/L (ref 135–145)
Total Bilirubin: 0.9 mg/dL (ref 0.0–1.2)
Total Protein: 5.2 g/dL — ABNORMAL LOW (ref 6.5–8.1)

## 2023-12-14 LAB — CBC
HCT: 23.3 % — ABNORMAL LOW (ref 36.0–46.0)
Hemoglobin: 7.7 g/dL — ABNORMAL LOW (ref 12.0–15.0)
MCH: 30.1 pg (ref 26.0–34.0)
MCHC: 33 g/dL (ref 30.0–36.0)
MCV: 91 fL (ref 80.0–100.0)
Platelets: 362 10*3/uL (ref 150–400)
RBC: 2.56 MIL/uL — ABNORMAL LOW (ref 3.87–5.11)
RDW: 15.9 % — ABNORMAL HIGH (ref 11.5–15.5)
WBC: 4.5 10*3/uL (ref 4.0–10.5)
nRBC: 0 % (ref 0.0–0.2)

## 2023-12-14 LAB — HEMOGLOBIN AND HEMATOCRIT, BLOOD
HCT: 29.2 % — ABNORMAL LOW (ref 36.0–46.0)
Hemoglobin: 9.6 g/dL — ABNORMAL LOW (ref 12.0–15.0)

## 2023-12-14 LAB — MAGNESIUM: Magnesium: 1.5 mg/dL — ABNORMAL LOW (ref 1.7–2.4)

## 2023-12-14 MED ORDER — MELATONIN 5 MG PO TABS
5.0000 mg | ORAL_TABLET | Freq: Once | ORAL | Status: AC
Start: 2023-12-14 — End: 2023-12-14
  Administered 2023-12-14: 5 mg via ORAL
  Filled 2023-12-14: qty 1

## 2023-12-14 MED ORDER — POTASSIUM CHLORIDE CRYS ER 20 MEQ PO TBCR
40.0000 meq | EXTENDED_RELEASE_TABLET | Freq: Once | ORAL | Status: AC
  Administered 2023-12-14: 40 meq via ORAL
  Filled 2023-12-14: qty 2

## 2023-12-14 MED ORDER — MAGNESIUM SULFATE 2 GM/50ML IV SOLN
2.0000 g | Freq: Once | INTRAVENOUS | Status: AC
  Administered 2023-12-14: 2 g via INTRAVENOUS
  Filled 2023-12-14: qty 50

## 2023-12-14 NOTE — Evaluation (Signed)
Physical Therapy Evaluation Patient Details Name: Cynthia Gilmore MRN: [id number] DOB: 05-Feb-1934 Today's Date: 12/14/2023  History of Present Illness  89 y.o. female who presents to acute rehab after recent reports of nausea, vomiting, and diarrhea. The patient was recently admitted and  had a ground level fall at home resulting in left femoral neck fx s/p on IM Nail 1/1.  PMH includes: Depression/anxiety  DM  GERD  Hemorrhoids  HTN  HLD  Hiatal hernia  IBS  Lymphocytic colitis  History of TIA.  Clinical Impression  Patient was admitted with the above diagnosis. She lives at home with her husband and is his caretaker. The patient was willing to participate with therapy today, although she reports that she was feeling a bit weak prior to mobility. The patient was able to independently transition herself from supine > sitting, expressing no pain or difficulty when demonstrating. Patient was able to ambulate within her room and to the bathroom via supervision with a RW, however deferred further ambulation due to chest pain and tachycardia. The patient was educated on remaining hydrated throughout the day and energy conservation. Vitals were assessed throughout the session: (HR: 94bpm pre-tx, 140bpm during-tx). Recommend follow-up with mobility specialist and continued HHPT at discharge.       If plan is discharge home, recommend the following: Assistance with cooking/housework   Can travel by private vehicle        Equipment Recommendations None recommended by PT  Recommendations for Other Services       Functional Status Assessment Patient has had a recent decline in their functional status and demonstrates the ability to make significant improvements in function in a reasonable and predictable amount of time.     Precautions / Restrictions Precautions Precautions: Fall;Other (comment) Precaution Comments: Enteric Precautions Restrictions Weight Bearing Restrictions Per Provider Order:  Yes LLE Weight Bearing Per Provider Order: Weight bearing as tolerated      Mobility  Bed Mobility Overal bed mobility: Independent Bed Mobility: Supine to Sit     Supine to sit: Independent     General bed mobility comments: No use of handrail to roll on the right side, utilized UE support to sit at the EOB    Transfers Overall transfer level: Modified independent Equipment used: Rolling walker (2 wheels) Transfers: Sit to/from Stand Sit to Stand: Modified independent (Device/Increase time)           General transfer comment: Utilized unilateral UE support to ascend from a sitting postion    Ambulation/Gait Ambulation/Gait assistance: Supervision Gait Distance (Feet): 15 Feet Assistive device: Rolling walker (2 wheels) Gait Pattern/deviations: Step-through pattern Gait velocity: slowed     General Gait Details: Patient was able to ambulate to her bathroom, then to her chair. Deferred further ambulation due to chest pain and fatigue (HR: 140bpm, O2: 100%)  Stairs            Wheelchair Mobility     Tilt Bed    Modified Rankin (Stroke Patients Only)       Balance Overall balance assessment: Modified Independent Sitting-balance support: Feet supported Sitting balance-Leahy Scale: Good     Standing balance support: Bilateral upper extremity supported Standing balance-Leahy Scale: Good                               Pertinent Vitals/Pain Pain Assessment Pain Assessment: No/denies pain    Home Living Family/patient expects to be discharged to:: Private residence  Living Arrangements: Spouse/significant other Available Help at Discharge: Family;Available 24 hours/day Type of Home: House Home Access: Ramped entrance       Home Layout: One level Home Equipment: Agricultural consultant (2 wheels);Cane - single point;BSC/3in1;Shower seat;Wheelchair - manual;Adaptive equipment;Grab bars - tub/shower;Grab bars - toilet      Prior Function Prior  Level of Function : Independent/Modified Independent             Mobility Comments: Recently walking with RW, admits to frequent falls       Extremity/Trunk Assessment   Upper Extremity Assessment Upper Extremity Assessment: Overall WFL for tasks assessed    Lower Extremity Assessment Lower Extremity Assessment: LLE deficits/detail LLE Deficits / Details: L hip flexor activation against gravity, unable to move through full range when demonstrating seated marches       Communication   Communication Communication: No apparent difficulties  Cognition Arousal: Alert Behavior During Therapy: WFL for tasks assessed/performed Overall Cognitive Status: Within Functional Limits for tasks assessed                                          General Comments General comments (skin integrity, edema, etc.): Pre-tx Vitals (HR: 94), Post-tx vitals (BP: 153/73 ,O2:100%)    Exercises General Exercises - Lower Extremity Hip Flexion/Marching: AROM, Both, 5 reps, Seated   Assessment/Plan    PT Assessment Patient needs continued PT services  PT Problem List Decreased strength;Decreased range of motion;Decreased activity tolerance;Decreased mobility       PT Treatment Interventions DME instruction;Gait training;Stair training;Functional mobility training;Therapeutic activities;Therapeutic exercise;Balance training;Patient/family education    PT Goals (Current goals can be found in the Care Plan section)  Acute Rehab PT Goals Patient Stated Goal: Hopes to get home soon PT Goal Formulation: With patient Time For Goal Achievement: 12/28/23 Potential to Achieve Goals: Good    Frequency Min 1X/week     Co-evaluation               AM-PAC PT "6 Clicks" Mobility  Outcome Measure Help needed turning from your back to your side while in a flat bed without using bedrails?: None Help needed moving from lying on your back to sitting on the side of a flat bed without  using bedrails?: None Help needed moving to and from a bed to a chair (including a wheelchair)?: None Help needed standing up from a chair using your arms (e.g., wheelchair or bedside chair)?: None Help needed to walk in hospital room?: A Little Help needed climbing 3-5 steps with a railing? : A Lot 6 Click Score: 21    End of Session Equipment Utilized During Treatment: Gait belt Activity Tolerance: Patient limited by lethargy Patient left: in chair;with call bell/phone within reach;with chair alarm set;with nursing/sitter in room Nurse Communication: Other (comment) (VItals and symptoms) PT Visit Diagnosis: Unsteadiness on feet (R26.81);Muscle weakness (generalized) (M62.81);History of falling (Z91.81)    Time: 1610-9604 PT Time Calculation (min) (ACUTE ONLY): 27 min   Charges:   PT Evaluation $PT Eval Low Complexity: 1 Low PT Treatments $Therapeutic Activity: 8-22 mins PT General Charges $$ ACUTE PT VISIT: 1 Visit         Doreen Beam, SPT  Aylan Bayona 12/14/2023, 11:08 AM

## 2023-12-14 NOTE — Progress Notes (Signed)
Mobility Specialist Progress Note:   12/14/23 1355  Mobility  Activity Ambulated with assistance in hallway  Level of Assistance Standby assist, set-up cues, supervision of patient - no hands on  Assistive Device Front wheel walker  Distance Ambulated (ft) 80 ft  LLE Weight Bearing Per Provider Order WBAT  Activity Response Tolerated well  Mobility Referral Yes  Mobility visit 1 Mobility  Mobility Specialist Start Time (ACUTE ONLY) 1234  Mobility Specialist Stop Time (ACUTE ONLY) 1245  Mobility Specialist Time Calculation (min) (ACUTE ONLY) 11 min   Pre Mobility: 88 HR  During Mobility: 98 HR  Post Mobility: 75 HR   Pt received in bed, agreeable to mobility. SB to stand. MinG during ambulation. Pt co slight LLE pain during session but stated that she feels better overall compared to this morning. Asx throughout. VSS. Pt left in chair with call bell in reach and all needs met. Chair alarm on. Family present.   Leory Plowman  Mobility Specialist Please contact via [employer] office at [phone]

## 2023-12-14 NOTE — Hospital Course (Signed)
Cynthia Gilmore is a 89 y.o. female with a history of hypertension, TIA, diabetes mellitus type 2, IBS, anxiety, depression, GERD, anxiety.  Patient presented secondary to diarrhea, nausea and vomiting and found to have evidence of gastroenteritis. Hospitalization complicated by electrolyte abnormalities.

## 2023-12-14 NOTE — Plan of Care (Signed)

## 2023-12-14 NOTE — Progress Notes (Signed)
PROGRESS NOTE    Cynthia Gilmore  EGB:[id number] DOB: 1934/08/27 DOA: 12/13/2023 PCP: Ollen Bowl, MD   Brief Narrative: Cynthia Gilmore is a 89 y.o. female with a history of hypertension, TIA, diabetes mellitus type 2, IBS, anxiety, depression, GERD, anxiety.  Patient presented secondary to diarrhea, nausea and vomiting and found to have evidence of gastroenteritis. Hospitalization complicated by electrolyte abnormalities.   Assessment and Plan:  Gastroenteritis secondary to Norovirus GI pathogen panel significant for positive norovirus. Family contacts with similar illness. Afebrile. Diarrhea has improved today. -Continue supportive care  Hypokalemia Secondary to GI losses. Potassium of 3.3 today. -Potassium chloride 4 meq PO x1  Hypomagnesemia Secondary to GI losses. Magnesium of 1.5 today. -Magnesium sulfate 2 g IV  Hypocalcemia Likely secondary to hypoalbuminemia. Corrected calcium today of 9.4.  Acute anemia Likely secondary to perioperative blood loss from recent surgery. Hemoglobin of 8.3 g/dL at recent discharge. Hemoglobin of 8.5 g/dL this admission, down to 7.7 g/dL today. -Trend hemoglobin and transfuse for hemoglobin less than 7 g/dL  Recent left femur fracture PT recommending home health.  Primary hypertension -Continue losartan and Toprol XL  Diabetes mellitus type 2 Well controlled with last hemoglobin A1C of 6.1%. Metformin held on admission.  Anxiety Depression -Continue Cymbalta, Lexapro and Ativan   DVT prophylaxis: Lovenox Code Status:   Code Status: Full Code Family Communication: None at bedside Disposition Plan: Discharge home likely tomorrow after stable hemoglobin and improvement of electrolytes   Consultants:  None  Procedures:  None  Antimicrobials: None    Subjective: Diarrhea improved  Objective: BP (!) 157/73 (BP Location: Left Arm)   Pulse 98   Temp 97.7 F (36.5 C) (Oral)   Resp 19   Ht 5\' 5"  (1.651 m)    Wt 60.7 kg   SpO2 100%   BMI 22.27 kg/m   Examination:  General exam: Appears calm and comfortable Respiratory system: Respiratory effort normal. Gastrointestinal system: Abdomen is nondistended, soft and non-tender. Central nervous system: Alert and oriented. No focal neurological deficits. Musculoskeletal: No edema. No calf tenderness Psychiatry: Judgement and insight appear normal. Mood & affect appropriate.    Data Reviewed: I have personally reviewed following labs and imaging studies  CBC Lab Results  Component Value Date   WBC 4.5 12/14/2023   RBC 2.56 (L) 12/14/2023   HGB 7.7 (L) 12/14/2023   HCT 23.3 (L) 12/14/2023   MCV 91.0 12/14/2023   MCH 30.1 12/14/2023   PLT 362 12/14/2023   MCHC 33.0 12/14/2023   RDW 15.9 (H) 12/14/2023   LYMPHSABS 1.6 12/13/2023   MONOABS 0.8 12/13/2023   EOSABS 0.0 12/13/2023   BASOSABS 0.0 12/13/2023     Last metabolic panel Lab Results  Component Value Date   NA 141 12/14/2023   K 3.3 (L) 12/14/2023   CL 111 12/14/2023   CO2 22 12/14/2023   BUN 10 12/14/2023   CREATININE 0.56 12/14/2023   GLUCOSE 107 (H) 12/14/2023   GFRNONAA >60 12/14/2023   GFRAA 93 07/05/2019   CALCIUM 8.4 (L) 12/14/2023   PROT 5.2 (L) 12/14/2023   ALBUMIN 2.8 (L) 12/14/2023   BILITOT 0.9 12/14/2023   ALKPHOS 46 12/14/2023   AST 44 (H) 12/14/2023   ALT 43 12/14/2023   ANIONGAP 8 12/14/2023    GFR: Estimated Creatinine Clearance: 44.6 mL/min (by C-G formula based on SCr of 0.56 mg/dL).  Recent Results (from the past 240 hours)  Resp panel by RT-PCR (RSV, Flu A&B, Covid) Anterior  Nasal Swab     Status: None   Collection Time: 12/13/23 11:59 AM   Specimen: Anterior Nasal Swab  Result Value Ref Range Status   SARS Coronavirus 2 by RT PCR NEGATIVE NEGATIVE Final   Influenza A by PCR NEGATIVE NEGATIVE Final   Influenza B by PCR NEGATIVE NEGATIVE Final    Comment: (NOTE) The Xpert Xpress SARS-CoV-2/FLU/RSV plus assay is intended as an aid in  the diagnosis of influenza from Nasopharyngeal swab specimens and should not be used as a sole basis for treatment. Nasal washings and aspirates are unacceptable for Xpert Xpress SARS-CoV-2/FLU/RSV testing.  Fact Sheet for Patients: BloggerCourse.com  Fact Sheet for Healthcare Providers: SeriousBroker.it  This test is not yet approved or cleared by the [place] FDA and has been authorized for detection and/or diagnosis of SARS-CoV-2 by FDA under an Emergency Use Authorization (EUA). This EUA will remain in effect (meaning this test can be used) for the duration of the COVID-19 declaration under Section 564(b)(1) of the Act, 21 U.S.C. section 360bbb-3(b)(1), unless the authorization is terminated or revoked.     Resp Syncytial Virus by PCR NEGATIVE NEGATIVE Final    Comment: (NOTE) Fact Sheet for Patients: BloggerCourse.com  Fact Sheet for Healthcare Providers: SeriousBroker.it  This test is not yet approved or cleared by the [place] FDA and has been authorized for detection and/or diagnosis of SARS-CoV-2 by FDA under an Emergency Use Authorization (EUA). This EUA will remain in effect (meaning this test can be used) for the duration of the COVID-19 declaration under Section 564(b)(1) of the Act, 21 U.S.C. section 360bbb-3(b)(1), unless the authorization is terminated or revoked.  Performed at [facility], [address] [place], [place] [address]   Gastrointestinal Panel by PCR , Stool     Status: Abnormal   Collection Time: 12/13/23  2:10 PM   Specimen: Stool  Result Value Ref Range Status   Campylobacter species NOT DETECTED NOT DETECTED Final   Plesimonas shigelloides NOT DETECTED NOT DETECTED Final   Salmonella species NOT DETECTED NOT DETECTED Final   Yersinia enterocolitica NOT DETECTED NOT DETECTED Final   Vibrio species NOT DETECTED NOT DETECTED  Final   Vibrio cholerae NOT DETECTED NOT DETECTED Final   Enteroaggregative E coli (EAEC) NOT DETECTED NOT DETECTED Final   Enteropathogenic E coli (EPEC) NOT DETECTED NOT DETECTED Final   Enterotoxigenic E coli (ETEC) NOT DETECTED NOT DETECTED Final   Shiga like toxin producing E coli (STEC) NOT DETECTED NOT DETECTED Final   Shigella/Enteroinvasive E coli (EIEC) NOT DETECTED NOT DETECTED Final   Cryptosporidium NOT DETECTED NOT DETECTED Final   Cyclospora cayetanensis NOT DETECTED NOT DETECTED Final   Entamoeba histolytica NOT DETECTED NOT DETECTED Final   Giardia lamblia NOT DETECTED NOT DETECTED Final   Adenovirus F40/41 NOT DETECTED NOT DETECTED Final   Astrovirus NOT DETECTED NOT DETECTED Final   Norovirus GI/GII DETECTED (A) NOT DETECTED Final    Comment: CRITICAL RESULT CALLED TO, READ BACK BY AND VERIFIED WITH: Rosana Fret RN @ 0006 12/14/2023 LFD    Rotavirus A NOT DETECTED NOT DETECTED Final   Sapovirus (I, II, IV, and V) NOT DETECTED NOT DETECTED Final    Comment: Performed at [facility], [address] [place], [place] [address]  C Difficile Quick Screen w PCR reflex     Status: None   Collection Time: 12/13/23  2:10 PM   Specimen: Stool  Result Value Ref Range Status   C Diff antigen NEGATIVE  NEGATIVE Final   C Diff toxin NEGATIVE NEGATIVE Final   C Diff interpretation No C. difficile detected.  Final    Comment: Performed at [facility], [address], [place], [place] [address]  Respiratory (~20 pathogens) panel by PCR     Status: None   Collection Time: 12/13/23  6:23 PM   Specimen: Nasopharyngeal Swab; Respiratory  Result Value Ref Range Status   Adenovirus NOT DETECTED NOT DETECTED Final   Coronavirus 229E NOT DETECTED NOT DETECTED Final    Comment: (NOTE) The Coronavirus on the Respiratory Panel, DOES NOT test for the novel  Coronavirus (2019 nCoV)    Coronavirus HKU1 NOT DETECTED NOT DETECTED Final   Coronavirus NL63 NOT DETECTED NOT  DETECTED Final   Coronavirus OC43 NOT DETECTED NOT DETECTED Final   Metapneumovirus NOT DETECTED NOT DETECTED Final   Rhinovirus / Enterovirus NOT DETECTED NOT DETECTED Final   Influenza A NOT DETECTED NOT DETECTED Final   Influenza B NOT DETECTED NOT DETECTED Final   Parainfluenza Virus 1 NOT DETECTED NOT DETECTED Final   Parainfluenza Virus 2 NOT DETECTED NOT DETECTED Final   Parainfluenza Virus 3 NOT DETECTED NOT DETECTED Final   Parainfluenza Virus 4 NOT DETECTED NOT DETECTED Final   Respiratory Syncytial Virus NOT DETECTED NOT DETECTED Final   Bordetella pertussis NOT DETECTED NOT DETECTED Final   Bordetella Parapertussis NOT DETECTED NOT DETECTED Final   Chlamydophila pneumoniae NOT DETECTED NOT DETECTED Final   Mycoplasma pneumoniae NOT DETECTED NOT DETECTED Final    Comment: Performed at [facility], [address] [place], [place] [address]      Radiology Studies: DG ABD ACUTE 2+V W 1V CHEST Result Date: 12/13/2023 CLINICAL DATA:  Nausea, vomiting, diarrhea. EXAM: DG ABDOMEN ACUTE WITH 1 VIEW CHEST COMPARISON:  Chest radiograph dated 12/04/2023. FINDINGS: There is no evidence of dilated bowel loops or free intraperitoneal air. Gas overlies the rectum. No radiopaque calculi are seen overlying the expected course of the urinary tract. Degenerative changes are seen in the spine and hips. Fixation hardware is seen in the left femur. Heart size and mediastinal contours are within normal limits. Insert vascular both lungs are clear. IMPRESSION: 1. Nonobstructive bowel gas pattern. 2. No acute cardiopulmonary process. Electronically Signed   By: Romona Curls M.D.   On: 12/13/2023 16:15      LOS: 0 days    Jacquelin Hawking, MD [facility] 12/14/2023, 12:21 PM   If 7PM-7AM, please contact night-coverage [email or web address]

## 2023-12-14 NOTE — Telephone Encounter (Signed)
I called and spoke with [name], the Daughter, she states that Cynthia Gilmore is in the hospital with Norovirus that the her dad has it to but that he was in ICU due to him aspirating. She sadi they called the EMS this morning to take them both to the hospital. And now [name] states that she has it.

## 2023-12-14 NOTE — TOC Progression Note (Signed)
Transition of Care [facility]) - Progression Note    Patient Details  Name: Cynthia Gilmore MRN: [id number] Date of Birth: Mar 24, 1934  [facility]) CM/SW Contact  Ronny Bacon, RN Phone Number: 12/14/2023, 9:58 AM  Clinical Narrative:   During progression rounds, reports of husband is a patient in ICU and daughter also has GI bug. At this time no one is home to help care for patient.          Expected Discharge Plan and Services                                               Social Determinants of Health (SDOH) Interventions SDOH Screenings   Food Insecurity: No Food Insecurity (12/13/2023)  Housing: Low Risk  (12/13/2023)  Transportation Needs: No Transportation Needs (12/13/2023)  Utilities: Not At Risk (12/13/2023)  Social Connections: Moderately Integrated (12/13/2023)  Tobacco Use: Medium Risk (12/13/2023)    Readmission Risk Interventions     No data to display

## 2023-12-14 NOTE — Care Management Obs Status (Signed)
MEDICARE OBSERVATION STATUS NOTIFICATION   Patient Details  Name: Cynthia Gilmore MRN: [id number] Date of Birth: [birth year]   Medicare Observation Status Notification Given:  Yes    Ronny Bacon, RN 12/14/2023, 9:33 AM

## 2023-12-15 ENCOUNTER — Telehealth: Payer: Self-pay | Admitting: Orthopedic Surgery

## 2023-12-15 DIAGNOSIS — A0811 Acute gastroenteropathy due to Norwalk agent: Secondary | ICD-10-CM

## 2023-12-15 LAB — HEMOGLOBIN AND HEMATOCRIT, BLOOD
HCT: 24.9 % — ABNORMAL LOW (ref 36.0–46.0)
Hemoglobin: 8 g/dL — ABNORMAL LOW (ref 12.0–15.0)

## 2023-12-15 LAB — BASIC METABOLIC PANEL
Anion gap: 7 (ref 5–15)
BUN: 8 mg/dL (ref 8–23)
CO2: 22 mmol/L (ref 22–32)
Calcium: 8.4 mg/dL — ABNORMAL LOW (ref 8.9–10.3)
Chloride: 112 mmol/L — ABNORMAL HIGH (ref 98–111)
Creatinine, Ser: 0.6 mg/dL (ref 0.44–1.00)
GFR, Estimated: >60 mL/min (ref >60)
Glucose, Bld: 105 mg/dL — ABNORMAL HIGH (ref 70–99)
Potassium: 4 mmol/L (ref 3.5–5.1)
Sodium: 141 mmol/L (ref 135–145)

## 2023-12-15 LAB — MAGNESIUM
Magnesium: 1.5 mg/dL — ABNORMAL LOW (ref 1.7–2.4)
Magnesium: 1.9 mg/dL (ref 1.7–2.4)

## 2023-12-15 MED ORDER — MAGNESIUM SULFATE 2 GM/50ML IV SOLN
2.0000 g | Freq: Once | INTRAVENOUS | Status: AC
Start: 2023-12-15 — End: 2023-12-15
  Administered 2023-12-15: 2 g via INTRAVENOUS
  Filled 2023-12-15: qty 50

## 2023-12-15 NOTE — Progress Notes (Addendum)
Physical Therapy Treatment Patient Details Name: Cynthia Gilmore MRN: [id number] DOB: 07/23/1934 Today's Date: 12/15/2023   History of Present Illness 89 y.o. female who presents to acute rehab after recent reports of nausea, vomiting, and diarrhea. The patient was recently admitted and  had a ground level fall at home resulting in left femoral neck fx s/p on IM Nail 1/1.  PMH includes: Depression/anxiety  DM  GERD  Hemorrhoids  HTN  HLD  Hiatal hernia  IBS  Lymphocytic colitis  History of TIA.    PT Comments  Patient was eager and prepared to start therapy today, progressing well toward PT goals. Focus of today's session was to improve activity tolerance, specifically for ambulation. The patient was Mod I +1 for the sit > stand transfer, successfully utilizing bilateral UE support to ascend from a sitting position. Patient requires CGA +1 while ambulating with a RW, to ensure safety. The patient demonstrates a step-through pattern, decreased cadence and stride length when ambulating 163ft around the unit. Assessed and monitored the patient's HR throughout the entirety of today's session (70-99bpm). Recommend follow-up with mobility specialist and continued HHPT at discharge.      If plan is discharge home, recommend the following: Assistance with cooking/housework   Can travel by private vehicle        Equipment Recommendations  None recommended by PT    Recommendations for Other Services       Precautions / Restrictions Precautions Precautions: Fall;Other (comment) Precaution Comments: Enteric Precautions Restrictions Weight Bearing Restrictions Per Provider Order: Yes LLE Weight Bearing Per Provider Order: Weight bearing as tolerated     Mobility  Bed Mobility                    Transfers Overall transfer level: Modified independent Equipment used: Rolling walker (2 wheels) Transfers: Sit to/from Stand Sit to Stand: Modified independent (Device/Increase time)            General transfer comment: Utilized bilateral UE support to ascend from a sitting postion    Ambulation/Gait Ambulation/Gait assistance: Contact guard assist Gait Distance (Feet): 120 Feet Assistive device: Rolling walker (2 wheels) Gait Pattern/deviations: Step-through pattern, Decreased stride length Gait velocity: slowed     General Gait Details: Patient reported that she had more energy than her last PT session to ambulate around the unit   Stairs             Wheelchair Mobility     Tilt Bed    Modified Rankin (Stroke Patients Only)       Balance Overall balance assessment: Modified Independent Sitting-balance support: Feet supported Sitting balance-Leahy Scale: Good     Standing balance support: Bilateral upper extremity supported Standing balance-Leahy Scale: Good Standing balance comment: reliant on UE support when standing                            Cognition Arousal: Alert Behavior During Therapy: WFL for tasks assessed/performed Overall Cognitive Status: Within Functional Limits for tasks assessed                                          Exercises      General Comments General comments (skin integrity, edema, etc.): Resting HR: 70-99      Pertinent Vitals/Pain Pain Assessment Pain Assessment: Faces Faces Pain Scale: Hurts a  little bit Pain Location: L thigh Pain Descriptors / Indicators: Sore, Grimacing, Guarding Pain Intervention(s): Monitored during session, Ice applied    Home Living                          Prior Function            PT Goals (current goals can now be found in the care plan section) Acute Rehab PT Goals Patient Stated Goal: Hopes to get home soon PT Goal Formulation: With patient Time For Goal Achievement: 12/28/23 Potential to Achieve Goals: Good Progress towards PT goals: Progressing toward goals    Frequency    Min 1X/week      PT Plan       Co-evaluation              AM-PAC PT "6 Clicks" Mobility   Outcome Measure  Help needed turning from your back to your side while in a flat bed without using bedrails?: None Help needed moving from lying on your back to sitting on the side of a flat bed without using bedrails?: None Help needed moving to and from a bed to a chair (including a wheelchair)?: None Help needed standing up from a chair using your arms (e.g., wheelchair or bedside chair)?: None Help needed to walk in hospital room?: A Little Help needed climbing 3-5 steps with a railing? : A Lot 6 Click Score: 21    End of Session Equipment Utilized During Treatment: Gait belt Activity Tolerance: Patient tolerated treatment well Patient left: in chair;with call bell/phone within reach;with chair alarm set   PT Visit Diagnosis: Unsteadiness on feet (R26.81);Muscle weakness (generalized) (M62.81);History of falling (Z91.81) Pain - Right/Left: Left Pain - part of body: Hip     Time: 8657-8469 PT Time Calculation (min) (ACUTE ONLY): 19 min  Charges:    $Therapeutic Activity: 8-22 mins PT General Charges $$ ACUTE PT VISIT: 1 Visit                     Doreen Beam, SPT   Greydon Betke 12/15/2023, 1:25 PM

## 2023-12-15 NOTE — Progress Notes (Signed)
Reviewed AVS, patient expressed understanding of medications, MD follow up reviewed. Patient transported to Unit with Dtr to see her husband.

## 2023-12-15 NOTE — Plan of Care (Signed)

## 2023-12-15 NOTE — TOC Initial Note (Signed)
Transition of Care [facility]) - Initial/Assessment Note    Patient Details  Name: Cynthia Gilmore MRN: [id number] Date of Birth: [birth year]  [facility]) CM/SW Contact:    Kingsley Plan, RN Phone Number: 12/15/2023, 9:54 AM  Clinical Narrative:                  Patient from home with husband. Husband currently on 49M at [facility].   Patient's daughter also sick but patient states getting a little better.   Patient active with [facility] for HHPT. PAtient wanting to continue services with [facility] Confirmed with Haywood Lasso with [facility] and enter orders for MD to sign  Expected Discharge Plan: Home w Home Health Services Barriers to Discharge: Continued Medical Work up   Patient Goals and CMS Choice Patient states their goals for this hospitalization and ongoing recovery are:: to return to home CMS Medicare.gov Compare Post Acute Care list provided to:: Patient Choice offered to / list presented to : Patient      Expected Discharge Plan and Services   Discharge Planning Services: CM Consult Post Acute Care Choice: Home Health Living arrangements for the past 2 months: Single Family Home                 DME Arranged: N/A DME Agency: NA       HH Arranged: PT HH Agency: [facility] Date HH Agency Contacted: 12/15/23 Time HH Agency Contacted: [phone] Representative spoke with at [facility] Agency: Haywood Lasso  Prior Living Arrangements/Services Living arrangements for the past 2 months: Single Family Home Lives with:: Spouse Patient language and need for interpreter reviewed:: Yes Do you feel safe going back to the place where you live?: Yes      Need for Family Participation in Patient Care: Yes (Comment) Care giver support system in place?: Yes (comment) Current home services: DME Criminal Activity/Legal Involvement Pertinent to Current Situation/Hospitalization: No - Comment as needed  Activities of Daily Living   ADL Screening (condition at time of  admission) Independently performs ADLs?: Yes (appropriate for developmental age) Is the patient deaf or have difficulty hearing?: Yes Does the patient have difficulty seeing, even when wearing glasses/contacts?: No Does the patient have difficulty concentrating, remembering, or making decisions?: No  Permission Sought/Granted   Permission granted to share information with : Yes, Verbal Permission Granted     Permission granted to share info w AGENCY: [facility]        Emotional Assessment Appearance:: Appears stated age Attitude/Demeanor/Rapport: Engaged Affect (typically observed): Accepting Orientation: : Oriented to Self, Oriented to Place, Oriented to  Time, Oriented to Situation Alcohol / Substance Use: Not Applicable Psych Involvement: No (comment)  Admission diagnosis:  Diarrhea [R19.7] Dehydration [E86.0] Hypokalemia [E87.6] Hypomagnesemia [E83.42] Prolonged Q-T interval on ECG [R94.31] Transaminitis [R74.01] Nausea vomiting and diarrhea [R11.2, R19.7] Patient Active Problem List   Diagnosis Date Noted   Nausea, vomiting, and diarrhea 12/13/2023   Viral gastroenteritis 12/13/2023   Hypokalemia 12/13/2023   Hypomagnesemia 12/13/2023   Hypocalcemia 12/13/2023   History of femur fracture 12/13/2023   Normocytic anemia 12/13/2023   Prolonged QT interval 12/13/2023   Transaminitis 12/13/2023   Anxiety 12/05/2023   Femoral fracture (HCC) 12/04/2023   Subvalvular aortic stenosis 03/02/2017   Low back pain radiating to both legs 06/06/2016   Sinusitis, acute 11/27/2015   Numerous medication allergies and sensitivities 07/20/2015   Carpal tunnel syndrome 05/30/2015   Dysuria 05/30/2015   Preventative health care 10/13/2014   Paroxysmal supraventricular  tachycardia (HCC) 11/03/2013   Palpitation 07/07/2012   Lipoma stomach 06/03/2012   Intermittent vertigo 10/08/2011   GERD (gastroesophageal reflux disease) 06/20/2011   IBS (irritable bowel syndrome) 06/20/2011    DEPRESSION/ANXIETY 11/11/2010   Type II diabetes mellitus with neurological manifestations (HCC) 01/31/2010   ALLERGIC RHINITIS 09/13/2009   INSOMNIA, CHRONIC 05/24/2009   MEMORY LOSS 05/24/2009   ABDOMINAL PAIN-EPIGASTRIC 01/09/2009   Lymphocytic colitis 07/19/2008   History of colonic polyps 07/19/2008   Essential hypertension 01/30/2007   DEGENERATIVE DISC DISEASE 01/30/2007   CARDIAC MURMUR, AORTIC 01/30/2007   PCP:  Ollen Bowl, MD Pharmacy:   [id number] [place], [place] - [address] AT [facility] OF [facility] & [address] [place] [address] Phone: [phone] Fax: [phone]     Social Drivers of Health (SDOH) Social History: SDOH Screenings   Food Insecurity: No Food Insecurity (12/13/2023)  Housing: Low Risk  (12/13/2023)  Transportation Needs: No Transportation Needs (12/13/2023)  Utilities: Not At Risk (12/13/2023)  Social Connections: Moderately Integrated (12/13/2023)  Tobacco Use: Medium Risk (12/13/2023)   SDOH Interventions:     Readmission Risk Interventions     No data to display

## 2023-12-15 NOTE — Telephone Encounter (Signed)
Patient's daughter called. Would like to know if the bandages should be changed and if she should see Dr. Christell Constant this week? [phone]

## 2023-12-15 NOTE — Plan of Care (Signed)
  Problem: Education: Goal: Knowledge of General Education information will improve Description: Including pain rating scale, medication(s)/side effects and non-pharmacologic comfort measures Outcome: Progressing   Problem: Health Behavior/Discharge Planning: Goal: Ability to manage health-related needs will improve Outcome: Progressing   Problem: Nutrition: Goal: Adequate nutrition will be maintained Outcome: Progressing   Problem: Coping: Goal: Level of anxiety will decrease Outcome: Progressing   Problem: Pain Managment: Goal: General experience of comfort will improve and/or be controlled Outcome: Progressing

## 2023-12-15 NOTE — Discharge Summary (Signed)
Physician Discharge Summary   Patient: Cynthia Gilmore MRN: [id number] DOB: 03-Mar-1934  Admit date:     12/13/2023  Discharge date: 12/15/23  Discharge Physician: Jacquelin Hawking, MD   PCP: Ollen Bowl, MD   Recommendations at discharge:  PCP visit for hospital follow-up  Discharge Diagnoses: Principal Problem:   Acute gastroenteropathy due to Norovirus Active Problems:   Viral gastroenteritis   Hypokalemia   Hypomagnesemia   Hypocalcemia   History of femur fracture   Essential hypertension   Type II diabetes mellitus with neurological manifestations (HCC)   DEPRESSION/ANXIETY   Normocytic anemia   Prolonged QT interval   Transaminitis   GERD (gastroesophageal reflux disease)  Resolved Problems:   * No resolved hospital problems. *  Hospital Course: Cynthia Gilmore is a 89 y.o. female with a history of hypertension, TIA, diabetes mellitus type 2, IBS, anxiety, depression, GERD, anxiety.  Patient presented secondary to diarrhea, nausea and vomiting and found to have evidence of gastroenteritis. Hospitalization complicated by electrolyte abnormalities.  Assessment and Plan:  Gastroenteritis secondary to Norovirus GI pathogen panel significant for positive norovirus. Family contacts with similar illness. Afebrile. Diarrhea has improved.   Hypokalemia Secondary to GI losses. Resolved with repletion. Potassium of 4.0 on day of discharge.   Hypomagnesemia Secondary to GI losses. Resolved with repletion. Magnesium of 1.9 on day of discharge.   Hypocalcemia Likely secondary to hypoalbuminemia. Corrected calcium of 9.4.   Acute anemia Likely secondary to perioperative blood loss from recent surgery. Hemoglobin of 8.3 g/dL at recent discharge. Hemoglobin of 8.5 g/dL this admission, down to 7.7 g/dL. Stable at 8.0 g/dL prior to discharge.   Recent left femur fracture PT recommending home health.   Primary hypertension Continue losartan and Toprol XL   Diabetes  mellitus type 2 Well controlled with last hemoglobin A1C of 6.1%. Metformin held on admission.   Anxiety Depression -Continue Cymbalta, Lexapro and Ativan   Procedures performed: None  Disposition: Home Diet recommendation: Regular diet    DISCHARGE MEDICATION: Allergies as of 12/15/2023       Reactions   Alprazolam    REACTION: sleepy   Amlodipine Besylate    REACTION: swelling   Aripiprazole    Augmentin [amoxicillin-pot Clavulanate] Diarrhea   Azithromycin    Bupropion Hcl    REACTION: insomnia   Cefaclor    REACTION: mouth blisters   Celecoxib    REACTION: "drained"   Cephalexin    Ciprofloxacin    Clonidine Hydrochloride    REACTION: headache, dizziness   Desvenlafaxine    Diphenoxylate-atropine    Divalproex Sodium    REACTION: nausea,tremors,fatigue   Doxycycline    Entex    REACTION: nausea, headache   Est Estrogens-methyltest    REACTION: headache   Fluoxetine Hcl    Furosemide    Hydrochlorothiazide    Hydrocodone-acetaminophen    REACTION: headache   Lamotrigine    REACTION: mood swing,chills, nausea   Lansoprazole    Lisinopril    REACTION: cough   Lorazepam    Meloxicam    REACTION: nausea, diarrhea   Methylphenidate Hcl    Metronidazole    Mirtazapine    REACTION: swelling   Nabumetone    REACTION: nausea, headache   Nitrofuran Derivatives Nausea Only   Olmesartan Medoxomil    Omeprazole Diarrhea   Other    Alum and Mag Hydroxide Simeth(Solution) GI Cocktail. Diarrhea    Oxycodone-acetaminophen    Pantoprazole Sodium    Paroxetine  REACTION: tremor, insomnia, nausea   Quetiapine    Rabeprazole Sodium    REACTION: diarrhea   Rofecoxib    Sertraline Hcl    REACTION: exreme confusion   Sumatriptan    REACTION: cardiologist says "no"   Telithromycin    REACTION: "drained"   Topamax    Confusion and mood swings   Tramadol-acetaminophen    Trintellix [vortioxetine] Other (See Comments)   "Made things worst"   Venlafaxine     REACTION: hypertension, nausea, headache   Verapamil    Headaches and hot flashes    Vilazodone Hcl [vilazodone Hcl]         Medication List     STOP taking these medications    oxyCODONE 5 MG immediate release tablet Commonly known as: Oxy IR/ROXICODONE       TAKE these medications    acetaminophen 500 MG tablet Commonly known as: TYLENOL Take 2 tablets (1,000 mg total) by mouth every 8 (eight) hours for 14 days.   ALPRAZolam 0.25 MG tablet Commonly known as: XANAX TAKE 1/2 TO 1 TABLET BY MOUTH ONCE DAILY AS NEEDED   aspirin 81 MG chewable tablet Chew 1 tablet (81 mg total) by mouth 2 (two) times daily.   DULoxetine 60 MG capsule Commonly known as: CYMBALTA Take 60 mg by mouth daily.   escitalopram 20 MG tablet Commonly known as: LEXAPRO Take 20 mg by mouth daily.   ESTROVEN ENERGY PO Take 1 tablet by mouth daily.   hyoscyamine 0.125 MG Tbdp disintergrating tablet Commonly known as: ANASPAZ Place 1 tablet (0.125 mg total) under the tongue every 4 (four) hours as needed for cramping.   losartan 100 MG tablet Commonly known as: COZAAR Take 100 mg by mouth daily.   metFORMIN 500 MG 24 hr tablet Commonly known as: GLUCOPHAGE-XR TAKE 1 TABLET(500 MG) BY MOUTH TWICE DAILY WITH A MEAL   metoprolol succinate 50 MG 24 hr tablet Commonly known as: TOPROL-XL TAKE WITH OR IMMEDIATELY FOLLOWING A MEAL   metoprolol succinate 25 MG 24 hr tablet Commonly known as: TOPROL-XL TAKE 1 TABLET(25 MG) BY MOUTH TWICE DAILY IN ADDITION TO YOUR 50 MG TABLET TO TOTAL 75 MG DAILY   omeprazole 40 MG capsule Commonly known as: PRILOSEC Take 40 mg by mouth every morning.   spironolactone 25 MG tablet Commonly known as: ALDACTONE TAKE 1 AND 1/2 TABLETS(37.5 MG) BY MOUTH DAILY   Vitamin C 500 MG Caps Take by mouth.        Follow-up Information     [facility] Follow up.   Specialty: Home Health Services Contact information: [address] [place] [address] [place] [address] [phone]         Ollen Bowl, MD. Schedule an appointment as soon as possible for a visit.   Specialty: Internal Medicine Why: For hospital follow-up Contact information: [address] [place] [address] [phone]                Discharge Exam: BP (!) 117/57 (BP Location: Right Arm)   Pulse 63   Temp 98 F (36.7 C) (Oral)   Resp 16   Ht 5\' 5"  (1.651 m)   Wt 60.7 kg   SpO2 99%   BMI 22.27 kg/m   General exam: Appears calm and comfortable  Respiratory system: Respiratory effort normal. Central nervous system: Alert and oriented Psychiatry: Judgement and insight appear normal. Mood & affect appropriate.   Condition at discharge: stable  The results of significant diagnostics from this hospitalization (including imaging, microbiology, ancillary and laboratory) are listed below for reference.   Imaging Studies: DG ABD ACUTE 2+V W 1V CHEST Result Date: 12/13/2023 CLINICAL DATA:  Nausea, vomiting, diarrhea. EXAM: DG ABDOMEN ACUTE WITH 1 VIEW CHEST COMPARISON:  Chest radiograph dated 12/04/2023. FINDINGS: There is no evidence of dilated bowel loops or free intraperitoneal air. Gas overlies the rectum. No radiopaque calculi are seen overlying the expected course of the urinary tract. Degenerative changes are seen in the spine and hips. Fixation hardware is seen in the left femur. Heart size and mediastinal contours are within normal limits. Insert vascular both lungs are clear. IMPRESSION: 1. Nonobstructive bowel gas pattern. 2. No acute cardiopulmonary process. Electronically Signed   By: Romona Curls M.D.   On: 12/13/2023 16:15   DG HIP UNILAT WITH PELVIS 2-3 VIEWS LEFT Result Date: 12/05/2023 CLINICAL DATA:  Postoperative left hip ORIF.  Fracture. EXAM: DG HIP (WITH OR WITHOUT PELVIS) 2-3V LEFT COMPARISON:  Pelvis and left hip radiographs 12/04/2023 FINDINGS: Interval cephalomedullary nail fixation of  the previously seen proximal left femoral intertrochanteric fracture. There is improved, near anatomic alignment. No perihardware lucency is seen to indicate hardware failure or loosening. Expected postoperative changes including lateral left hip subcutaneous air and lateral left hip surgical skin staples. The bilateral femoroacetabular joint spaces are maintained. Mild bilateral superolateral acetabular degenerative osteophytes. Mild pubic symphysis joint space narrowing and peripheral osteophytosis. IMPRESSION: Interval cephalomedullary nail fixation of the previously seen proximal left femoral intertrochanteric fracture with improved, near anatomic alignment. Electronically Signed   By: Neita Garnet M.D.   On: 12/05/2023 15:19   DG HIP UNILAT WITH PELVIS 2-3 VIEWS LEFT Result Date: 12/05/2023 CLINICAL DATA:  [id number] Surgery, elective [id number] EXAM: DG HIP (WITH OR WITHOUT PELVIS) 2-3V LEFT COMPARISON:  12/04/2023 FINDINGS: Five C-arm fluoroscopic images were obtained intraoperatively and submitted for post operative interpretation. Images obtained during left hip ORIF secondary to intertrochanteric left femur fracture. Alignment appears near anatomic. 1 minute and 16 seconds of fluoroscopy time utilized. Radiation dose: 7.56 mGy. Please see the performing provider's procedural report for further detail. IMPRESSION: Intraoperative fluoroscopy during left hip ORIF. Electronically Signed   By: Duanne Guess D.O.   On: 12/05/2023 13:33   DG C-Arm 1-60 Min-No Report Result Date: 12/05/2023 Fluoroscopy was utilized by the requesting physician.  No radiographic interpretation.   DG FEMUR MIN 2 VIEWS LEFT Result Date: 12/04/2023 CLINICAL DATA:  Mechanical fall onto left hip. Intertrochanteric hip fracture. EXAM: LEFT FEMUR 2 VIEWS COMPARISON:  Hip radiograph earlier today FINDINGS: Redemonstrated mildly displaced intertrochanteric proximal femur fracture. No hip dislocation. The distal femur is intact. No  additional fracture. Knee alignment is normal with minor degenerative change. IMPRESSION: Intertrochanteric proximal femur fracture. The distal femur is intact. Electronically Signed   By: Narda Rutherford M.D.   On: 12/04/2023 22:18   CT Head Wo Contrast Result Date: 12/04/2023 CLINICAL DATA:  Fall EXAM: CT HEAD WITHOUT CONTRAST TECHNIQUE: Contiguous axial images were obtained from the base of the skull through the vertex without intravenous contrast. RADIATION DOSE REDUCTION: This exam was performed according to the departmental dose-optimization program which includes automated exposure control, adjustment of the mA and/or kV according to patient size and/or use of iterative reconstruction technique. COMPARISON:  02/23/2023 FINDINGS: Brain: No evidence of acute infarction, hemorrhage, mass, mass effect, or midline shift. No hydrocephalus or extra-axial fluid collection. Partial empty sella. Normal craniocervical junction. Vascular: No hyperdense vessel. Skull: Negative for  fracture or focal lesion. Sinuses/Orbits: No acute finding. Other: The mastoid air cells are well aerated. IMPRESSION: No acute intracranial process. Electronically Signed   By: Wiliam Ke M.D.   On: 12/04/2023 19:41   DG Chest Portable 1 View Result Date: 12/04/2023 CLINICAL DATA:  Mechanical fall.  Hip and knee pain. EXAM: PORTABLE CHEST 1 VIEW COMPARISON:  Chest radiograph 02/23/2023 FINDINGS: The cardiomediastinal contours are normal. Aortic atherosclerosis. Pulmonary vasculature is normal. No consolidation, pleural effusion, or pneumothorax. No acute osseous abnormalities are seen. IMPRESSION: No active disease. Electronically Signed   By: Narda Rutherford M.D.   On: 12/04/2023 19:04   DG Hip Unilat W or Wo Pelvis 2-3 Views Left Result Date: 12/04/2023 CLINICAL DATA:  Fall, left hip pain EXAM: DG HIP (WITH OR WITHOUT PELVIS) 2-3V LEFT COMPARISON:  None Available. FINDINGS: Mildly displaced intertrochanteric left hip fracture.  Bilateral hip joint spaces are preserved. Visualized bony pelvis appears intact. IMPRESSION: Mildly displaced intertrochanteric left hip fracture. Electronically Signed   By: Charline Bills M.D.   On: 12/04/2023 19:02    Microbiology: Results for orders placed or performed during the hospital encounter of 12/13/23  Resp panel by RT-PCR (RSV, Flu A&B, Covid) Anterior Nasal Swab     Status: None   Collection Time: 12/13/23 11:59 AM   Specimen: Anterior Nasal Swab  Result Value Ref Range Status   SARS Coronavirus 2 by RT PCR NEGATIVE NEGATIVE Final   Influenza A by PCR NEGATIVE NEGATIVE Final   Influenza B by PCR NEGATIVE NEGATIVE Final    Comment: (NOTE) The Xpert Xpress SARS-CoV-2/FLU/RSV plus assay is intended as an aid in the diagnosis of influenza from Nasopharyngeal swab specimens and should not be used as a sole basis for treatment. Nasal washings and aspirates are unacceptable for Xpert Xpress SARS-CoV-2/FLU/RSV testing.  Fact Sheet for Patients: BloggerCourse.com  Fact Sheet for Healthcare Providers: SeriousBroker.it  This test is not yet approved or cleared by the [place] FDA and has been authorized for detection and/or diagnosis of SARS-CoV-2 by FDA under an Emergency Use Authorization (EUA). This EUA will remain in effect (meaning this test can be used) for the duration of the COVID-19 declaration under Section 564(b)(1) of the Act, 21 U.S.C. section 360bbb-3(b)(1), unless the authorization is terminated or revoked.     Resp Syncytial Virus by PCR NEGATIVE NEGATIVE Final    Comment: (NOTE) Fact Sheet for Patients: BloggerCourse.com  Fact Sheet for Healthcare Providers: SeriousBroker.it  This test is not yet approved or cleared by the [place] FDA and has been authorized for detection and/or diagnosis of SARS-CoV-2 by FDA under an Emergency Use  Authorization (EUA). This EUA will remain in effect (meaning this test can be used) for the duration of the COVID-19 declaration under Section 564(b)(1) of the Act, 21 U.S.C. section 360bbb-3(b)(1), unless the authorization is terminated or revoked.  Performed at [facility], [address], [place], [place] [address]   Gastrointestinal Panel by PCR , Stool     Status: Abnormal   Collection Time: 12/13/23  2:10 PM   Specimen: Stool  Result Value Ref Range Status   Campylobacter species NOT DETECTED NOT DETECTED Final   Plesimonas shigelloides NOT DETECTED NOT DETECTED Final   Salmonella species NOT DETECTED NOT DETECTED Final   Yersinia enterocolitica NOT DETECTED NOT DETECTED Final   Vibrio species NOT DETECTED NOT DETECTED Final   Vibrio cholerae NOT DETECTED NOT DETECTED Final   Enteroaggregative E coli (EAEC) NOT DETECTED NOT DETECTED  Final   Enteropathogenic E coli (EPEC) NOT DETECTED NOT DETECTED Final   Enterotoxigenic E coli (ETEC) NOT DETECTED NOT DETECTED Final   Shiga like toxin producing E coli (STEC) NOT DETECTED NOT DETECTED Final   Shigella/Enteroinvasive E coli (EIEC) NOT DETECTED NOT DETECTED Final   Cryptosporidium NOT DETECTED NOT DETECTED Final   Cyclospora cayetanensis NOT DETECTED NOT DETECTED Final   Entamoeba histolytica NOT DETECTED NOT DETECTED Final   Giardia lamblia NOT DETECTED NOT DETECTED Final   Adenovirus F40/41 NOT DETECTED NOT DETECTED Final   Astrovirus NOT DETECTED NOT DETECTED Final   Norovirus GI/GII DETECTED (A) NOT DETECTED Final    Comment: CRITICAL RESULT CALLED TO, READ BACK BY AND VERIFIED WITH: Rosana Fret RN @ 0006 12/14/2023 LFD    Rotavirus A NOT DETECTED NOT DETECTED Final   Sapovirus (I, II, IV, and V) NOT DETECTED NOT DETECTED Final    Comment: Performed at [facility], [address] [place], [place] [address]  C Difficile Quick Screen w PCR reflex     Status: None   Collection Time: 12/13/23  2:10 PM    Specimen: Stool  Result Value Ref Range Status   C Diff antigen NEGATIVE NEGATIVE Final   C Diff toxin NEGATIVE NEGATIVE Final   C Diff interpretation No C. difficile detected.  Final    Comment: Performed at [facility], [address] [place], [place] [address]  Respiratory (~20 pathogens) panel by PCR     Status: None   Collection Time: 12/13/23  6:23 PM   Specimen: Nasopharyngeal Swab; Respiratory  Result Value Ref Range Status   Adenovirus NOT DETECTED NOT DETECTED Final   Coronavirus 229E NOT DETECTED NOT DETECTED Final    Comment: (NOTE) The Coronavirus on the Respiratory Panel, DOES NOT test for the novel  Coronavirus (2019 nCoV)    Coronavirus HKU1 NOT DETECTED NOT DETECTED Final   Coronavirus NL63 NOT DETECTED NOT DETECTED Final   Coronavirus OC43 NOT DETECTED NOT DETECTED Final   Metapneumovirus NOT DETECTED NOT DETECTED Final   Rhinovirus / Enterovirus NOT DETECTED NOT DETECTED Final   Influenza A NOT DETECTED NOT DETECTED Final   Influenza B NOT DETECTED NOT DETECTED Final   Parainfluenza Virus 1 NOT DETECTED NOT DETECTED Final   Parainfluenza Virus 2 NOT DETECTED NOT DETECTED Final   Parainfluenza Virus 3 NOT DETECTED NOT DETECTED Final   Parainfluenza Virus 4 NOT DETECTED NOT DETECTED Final   Respiratory Syncytial Virus NOT DETECTED NOT DETECTED Final   Bordetella pertussis NOT DETECTED NOT DETECTED Final   Bordetella Parapertussis NOT DETECTED NOT DETECTED Final   Chlamydophila pneumoniae NOT DETECTED NOT DETECTED Final   Mycoplasma pneumoniae NOT DETECTED NOT DETECTED Final    Comment: Performed at [facility], [address] [place], [place] [address]    Labs: CBC: Recent Labs  Lab 12/09/23 [phone] 12/13/23 1159 12/14/23 0606 12/14/23 1403 12/15/23 0618  WBC 6.5 6.8 4.5  --   --   NEUTROABS  --  4.2  --   --   --   HGB 8.3* 8.5* 7.7* 9.6* 8.0*  HCT 25.8* 25.8* 23.3* 29.2* 24.9*  MCV 89.9 90.5 91.0  --   --   PLT 279 379 362  --   --     Basic Metabolic Panel: Recent Labs  Lab 12/13/23 1159 12/14/23 0606 12/15/23 0618  NA 138 141 141  K 2.7* 3.3* 4.0  CL 107 111 112*  CO2 18* 22  22  GLUCOSE 127* 107* 105*  BUN 16 10 8   CREATININE 0.69 0.56 0.60  CALCIUM 8.2* 8.4* 8.4*  MG 1.3* 1.5* 1.5*   Liver Function Tests: Recent Labs  Lab 12/13/23 1159 12/14/23 0606  AST 73* 44*  ALT 60* 43  ALKPHOS 53 46  BILITOT 1.0 0.9  PROT 6.0* 5.2*  ALBUMIN 3.4* 2.8*   CBG: Recent Labs  Lab 12/08/23 1625 12/08/23 2013 12/09/23 0805 12/09/23 1149  GLUCAP 98 118* 119* 113*    Discharge time spent: 35 minutes.  Signed: Jacquelin Hawking, MD [facility] 12/15/2023

## 2023-12-15 NOTE — Progress Notes (Signed)
Mobility Specialist Progress Note:   12/15/23 1217  Mobility  Activity Ambulated with assistance to bathroom  Level of Assistance Standby assist, set-up cues, supervision of patient - no hands on  Assistive Device Front wheel walker  Distance Ambulated (ft) 15 ft  LLE Weight Bearing Per Provider Order WBAT  Activity Response Tolerated well  Mobility Referral Yes  Mobility visit 1 Mobility  Mobility Specialist Start Time (ACUTE ONLY) 0920  Mobility Specialist Stop Time (ACUTE ONLY) 0930  Mobility Specialist Time Calculation (min) (ACUTE ONLY) 10 min   Pt received in bed, requesting assitance to BR. Standby assist to ambulate to BR with RW. No complaints stated. VSS. Pt left in BR and reminded to pull call bell when ready.   Leory Plowman  Mobility Specialist Please contact via [employer] office at [phone]

## 2023-12-15 NOTE — Discharge Instructions (Signed)
Cynthia Gilmore,  You were in the hospital with diarrheal illness secondary to norovirus infection. This has improved with time. We treated your dehydration and electrolytes with IV fluids and IV supplementation. Please follow-up with your PCP and keep well hydrated.

## 2023-12-16 NOTE — Telephone Encounter (Signed)
I called and advised Cynthia Gilmore of Dr. Kathi Der Message below. She states that they called and spoke with someone in our office and was told to leave the clear bandage on but to take the rest of it off.  I advised her to take it all off, and she understands this. She asked if it was rubbing her her clothes what should they do, I advised that they can put a dressing over it as needed.

## 2023-12-18 ENCOUNTER — Telehealth: Payer: Self-pay | Admitting: Orthopedic Surgery

## 2023-12-18 NOTE — Telephone Encounter (Signed)
Received call from [place] (OT) with [facility] needing verbal orders for HHOT 1 Wk 6. The number to contact Judeth Cornfield is [phone]

## 2023-12-21 NOTE — Telephone Encounter (Signed)
I called and gave verbal auth

## 2023-12-23 ENCOUNTER — Encounter: Payer: Medicare Other | Admitting: Orthopedic Surgery

## 2023-12-23 ENCOUNTER — Other Ambulatory Visit (INDEPENDENT_AMBULATORY_CARE_PROVIDER_SITE_OTHER): Payer: Medicare Other

## 2023-12-23 ENCOUNTER — Ambulatory Visit (INDEPENDENT_AMBULATORY_CARE_PROVIDER_SITE_OTHER): Payer: Medicare Other | Admitting: Orthopedic Surgery

## 2023-12-23 DIAGNOSIS — S72102D Unspecified trochanteric fracture of left femur, subsequent encounter for closed fracture with routine healing: Secondary | ICD-10-CM | POA: Diagnosis not present

## 2023-12-23 NOTE — Progress Notes (Signed)
Orthopedic Surgery Post-operative Office Visit  Procedure: left intertrochanteric hip fracture CMN Date of Surgery: 12/05/2023 (~2 weeks post-op)  Assessment: Patient is a 89 y.o. who is doing well after surgery. Pain gradually getting better   Plan: -Operative plans complete -DVT ppx: ASA 81mg  BID x6 weeks -Staples removed today in office -Weight bearing as tolerated -Continue to work with PT -Okay to let soap/water run over the incisions -Pain management: weaning oxycodone -Return to office in 4 weeks, x-rays needed at next visit: AP/lateral hip  ___________________________________________________________________________   Subjective: Patient got discharged from the hospital and then contracted norovirus.  She got readmitted for rehydration.  She was discharged the next day.  Since then, she has been doing well.  She has had some hip soreness but is getting better with time.  She has been working with physical therapy.  She is ambulating with a walker.  She has not noticed any redness or drainage around her incisions.  Objective:  General: no acute distress, appropriate affect Neurologic: alert, answering questions appropriately, following commands Respiratory: unlabored breathing on room air Skin: incisions are well-approximated with no erythema, induration, active/expressible drainage  MSK (LLE): Ambulating with walker, EHL/TA/GSC intact, no pain with logroll, palpable DP pulse, foot warm well-perfused, sensation intact to light touch in sural/saphenous/deep peroneal/superficial peroneal/tibial nerve distributions  Imaging: X-rays of the left hip from 12/23/2023 were independently reviewed and interpreted, showing short cephalomedullary rod in place.  The lag screws appear well centered within the femoral head.  No lucency around the screws.  Fracture alignment unchanged from immediate postoperative films.  No new fracture seen.   Patient name: Cynthia Gilmore Patient MRN:  [id number] Date of visit: 12/23/23

## 2024-01-11 ENCOUNTER — Ambulatory Visit: Payer: Medicare Other | Admitting: Nurse Practitioner

## 2024-01-20 ENCOUNTER — Other Ambulatory Visit (INDEPENDENT_AMBULATORY_CARE_PROVIDER_SITE_OTHER): Payer: Medicare Other

## 2024-01-20 ENCOUNTER — Ambulatory Visit (INDEPENDENT_AMBULATORY_CARE_PROVIDER_SITE_OTHER): Payer: Medicare Other | Admitting: Orthopedic Surgery

## 2024-01-20 DIAGNOSIS — S72102D Unspecified trochanteric fracture of left femur, subsequent encounter for closed fracture with routine healing: Secondary | ICD-10-CM

## 2024-01-20 NOTE — Progress Notes (Signed)
 Orthopedic Surgery Post-operative Office Visit   Procedure: left intertrochanteric hip fracture CMN Date of Surgery: 12/05/2023 (~6 weeks post-op)   Assessment: Patient is a 89 y.o. who has noticed decreasing pain and has been walking more. Has some soreness in her groin after working with PT     Plan: -Operative plans complete -DVT ppx: ASA 81mg  BID x6 weeks -Okay to submerge wound at this time -Weight bearing as tolerated -Continue to work with PT -Pain management: tylenol -Return to office in 6 weeks, x-rays needed at next visit: AP/lateral hip   ___________________________________________________________________________     Subjective: Patient has been at home. She is noticing decreasing pain in her leg. She is only using tylenol to control the pain. She said after she works with PT she will have soreness in her groin. She is ambulating with a walker. She has not noticed any redness or drainage around her incisions.    Objective:   General: no acute distress, appropriate affect Neurologic: alert, answering questions appropriately, following commands Respiratory: unlabored breathing on room air Skin: incisions are well healed with no erythema, induration, active/expressible drainage   MSK (LLE): Ambulating with walker, EHL/TA/GSC intact, no pain with logroll, palpable DP pulse, foot warm well-perfused, sensation intact to light touch in sural/saphenous/deep peroneal/superficial peroneal/tibial nerve distributions   Imaging: X-rays of the left hip from 01/20/2024 were independently reviewed and interpreted, showing short cephalomedullary rod in place. Fixation is in unchanged position since last films on 12/23/2023. Fracture still appears well reduced. No lucency seen around the screws. No new fracture seen.      Patient name: Cynthia Gilmore Patient MRN: [id number] Date of visit: 01/20/24

## 2024-02-03 ENCOUNTER — Other Ambulatory Visit: Payer: Self-pay | Admitting: Cardiology

## 2024-02-28 NOTE — Progress Notes (Unsigned)
 Cardiology Office Note   Date:  02/28/2024   ID:  Cynthia Gilmore, DOB 04/04/1934, MRN [id number]  PCP:  Ollen Bowl, MD  Cardiologist:   Undrea Archbold [place], MD   No chief complaint on file.     History of Present Illness: Cynthia Gilmore is a 89 y.o. female who presents for evaluation of SVT. Formerly seen by Dr Verdis Prime. She has a history of subvalvular Aortic stenosis with gradient. She also has history of anxiety disorder, DM type 2, and history of intermittent chest pain. She also has a history of nonsustained SVT and wide complex arrhythmia treated with Toprol XL.  She has multiple drug intolerances. On Lexapro for depression.   She was admitted in Jan following a fall with femoral neck fracture requiring surgery. She was readmitted shortly after that hospitalization with gastroenteropathy related to Norovirus  On follow up today she is doing very well. No chest pain, palpitations, dizziness or dyspnea. Sugar is doing well. BP well controlled.    Past Medical History:  Diagnosis Date   Allergy    Anxiety    Anxiety and depression    ? bipolar   Cataract    bil cataracts removed   Depression    Diabetes mellitus, type 2 (HCC)    Dysrhythmia    GERD (gastroesophageal reflux disease)    Heart murmur    Hemorrhoids 11/24/2010   Hiatal hernia    Hypertension    IBS (irritable bowel syndrome)    Lymphocytic colitis 11/24/2010   Mumerous medication allergies and sensitivities 07/20/2015   Osteoarthritis (arthritis due to wear and tear of joints)    Segmental colitis (HCC)    iscemic colitis 2009   Stroke [facility])    states 2 "'mini strokes" 1980   TIA (transient ischemic attack) 11/24/1998   x 3     Past Surgical History:  Procedure Laterality Date   ANKLE SURGERY  1999   right ankle fracture surgery   BLADDER REPAIR  1995   CATARACT EXTRACTION, BILATERAL     CHOLECYSTECTOMY  2004   COLONOSCOPY W/ BIOPSIES AND POLYPECTOMY   07/29/2011   internal  hemorrhoids, lymphocytic colitis   ESOPHAGEAL MANOMETRY     Normal   ESOPHAGOGASTRODUODENOSCOPY  07/29/2011   gastritis,hiatal hernia, tortuous esophagus   EUS  06/03/2012   Procedure: UPPER ENDOSCOPIC ULTRASOUND (EUS) LINEAR;  Surgeon: Rachael Fee, MD;  Location: [facility] ENDOSCOPY;  Service: Endoscopy;  Laterality: N/A;   HERNIA REPAIR  1995   INTRAMEDULLARY (IM) NAIL INTERTROCHANTERIC Left 12/05/2023   Procedure: INTRAMEDULLARY (IM) NAIL INTERTROCHANTERIC;  Surgeon: London Sheer, MD;  Location: [facility] OR;  Service: Orthopedics;  Laterality: Left;   PARTIAL HYSTERECTOMY  1992   partial     Current Outpatient Medications  Medication Sig Dispense Refill   ALPRAZolam (XANAX) 0.25 MG tablet TAKE 1/2 TO 1 TABLET BY MOUTH ONCE DAILY AS NEEDED (Patient taking differently: No sig reported) 30 tablet 0   Ascorbic Acid (VITAMIN C) 500 MG CAPS Take by mouth.     DULoxetine (CYMBALTA) 60 MG capsule Take 60 mg by mouth daily.     escitalopram (LEXAPRO) 20 MG tablet Take 20 mg by mouth daily.     hyoscyamine (ANASPAZ) 0.125 MG TBDP disintergrating tablet Place 1 tablet (0.125 mg total) under the tongue every 4 (four) hours as needed for cramping. 90 tablet 5   losartan (COZAAR) 100 MG tablet Take 100 mg by mouth daily.  metFORMIN (GLUCOPHAGE-XR) 500 MG 24 hr tablet TAKE 1 TABLET(500 MG) BY MOUTH TWICE DAILY WITH A MEAL 180 tablet 0   metoprolol succinate (TOPROL-XL) 25 MG 24 hr tablet TAKE 1 TABLET(25 MG) BY MOUTH TWICE DAILY IN ADDITION TO YOUR 50 MG TABLET TO TOTAL 75 MG DAILY 90 tablet 3   metoprolol succinate (TOPROL-XL) 50 MG 24 hr tablet TAKE WITH OR IMMEDIATELY FOLLOWING A MEAL 90 tablet 2   Misc Natural Products (ESTROVEN ENERGY PO) Take 1 tablet by mouth daily.     omeprazole (PRILOSEC) 40 MG capsule Take 40 mg by mouth every morning.     spironolactone (ALDACTONE) 25 MG tablet TAKE 1 AND 1/2 TABLETS(37.5 MG) BY MOUTH DAILY 135 tablet 2   No current facility-administered medications for this  visit.    Allergies:   Alprazolam, Amlodipine besylate, Aripiprazole, Augmentin [amoxicillin-pot clavulanate], Azithromycin, Bupropion hcl, Cefaclor, Celecoxib, Cephalexin, Ciprofloxacin, Clonidine hydrochloride, Desvenlafaxine, Diphenoxylate-atropine, Divalproex sodium, Doxycycline, Entex, Est estrogens-methyltest, Fluoxetine hcl, Furosemide, Hydrochlorothiazide, Hydrocodone-acetaminophen, Lamotrigine, Lansoprazole, Lisinopril, Lorazepam, Meloxicam, Methylphenidate hcl, Metronidazole, Mirtazapine, Nabumetone, Nitrofuran derivatives, Olmesartan medoxomil, Omeprazole, Other, Oxycodone-acetaminophen, Pantoprazole sodium, Paroxetine, Quetiapine, Rabeprazole sodium, Rofecoxib, Sertraline hcl, Sumatriptan, Telithromycin, Topamax, Tramadol-acetaminophen, Trintellix [vortioxetine], Venlafaxine, Verapamil, and Vilazodone hcl [vilazodone hcl]    Social History:  The patient  reports that she has quit smoking. Her smoking use included cigarettes. She has never used smokeless tobacco. She reports that she does not drink alcohol and does not use drugs.   Family History:  The patient's family history includes Diabetes in her maternal aunt, maternal uncle, and mother; Heart attack in her father; Heart disease in her father and paternal aunt.    ROS:  Please see the history of present illness.   Otherwise, review of systems are positive for .   All other systems are reviewed and negative.    PHYSICAL EXAM: VS:  There were no vitals taken for this visit. , BMI There is no height or weight on file to calculate BMI. GEN: Well nourished, well developed, in no acute distress  HEENT: normal  Neck: no JVD, carotid bruits, or masses Cardiac: RRR; no  rubs, or gallops,no edema  Soft 1/6 systolic murmur RUSB Respiratory:  clear to auscultation bilaterally, normal work of breathing GI: soft, nontender, nondistended, + BS MS: no deformity or atrophy  Skin: warm and dry, no rash Neuro:  Strength and sensation are  intact Psych: euthymic mood, full affect   EKG:  EKG is ordered today. The ekg ordered today demonstrates Junctional rhythm rate 63. Low voltage. Nonspecific IVCD. I have personally reviewed and interpreted this study.    Recent Labs: 12/14/2023: ALT 43; Platelets 362 12/15/2023: BUN 8; Creatinine, Ser 0.60; Hemoglobin 8.0; Magnesium 1.9; Potassium 4.0; Sodium 141    Lipid Panel    Component Value Date/Time   CHOL 196 03/24/2017 1048   TRIG 209.0 (H) 03/24/2017 1048   HDL 49.40 03/24/2017 1048   CHOLHDL 4 03/24/2017 1048   VLDL 41.8 (H) 03/24/2017 1048   LDLCALC 98 12/19/2016 1457   LDLDIRECT 124.0 03/24/2017 1048      Wt Readings from Last 3 Encounters:  12/13/23 133 lb 13.1 oz (60.7 kg)  12/04/23 140 lb (63.5 kg)  02/23/23 140 lb (63.5 kg)      Other studies Reviewed: Additional studies/ records that were reviewed today include:   Echo 11/22/13: Study Conclusions   - Left ventricle: The cavity size was normal. There was mild    focal basal hypertrophy of the septum. Systolic function  was vigorous. The estimated ejection fraction was in the    range of 65% to 70%. Wall motion was normal; there were no    regional wall motion abnormalities.  - Aortic valve: Mild sub aortic valve gradient.  - Mitral valve: There was systolic anterior motion. Mild    regurgitation.   Myoview 03/17/17: Study Highlights    Nuclear stress EF: 81%. Blood pressure demonstrated a normal response to exercise. There was no ST segment deviation noted during stress. Defect 1: There is a small defect of moderate severity present in the basal inferior location. This is a low risk study. The left ventricular ejection fraction is hyperdynamic (>65%).   Low risk stress nuclear study with mild inferobasal thinning; no significant ischemia (minimal reversibility felt unlikely to be significant); EF 81 with normal wall motion.    Event monitor 03/17/17: Study Highlights    The underlying  basic rhythm is normal sinus rhythm No sustained arrhythmias, either tachycardia or bradycardia were noted. Multiple events of "palpitation" and "flutter ", were reported but do not correlate with significant arrhythmia. On one occasion "palpitation" was associated with ventricular bigeminy   Event monitor 06/14/19: Study Highlights  NSR is basic rhythm Non sustained WCT lasting up to 12 seconds with rate 154-170 bpm Nonsustained SVT PAC's and PVC's      ASSESSMENT AND PLAN:  1. PSVT/ wide complex arrythmia. Well controlled on metoprolol.  Continue current therapy.   2. HTN essential- well controlled. Continue current therapy  3. Subvalvular aortic stenosis- mild obstruction and asymptomatic.     Current medicines are reviewed at length with the patient today.  The patient does not have concerns regarding medicines.  The following changes have been made:  no change  Labs/ tests ordered today include:  No orders of the defined types were placed in this encounter.    Disposition:   FU with me in one  year  Signed, Shenee Wignall [place], MD  02/28/2024 11:57 AM    [facility] [address] [place], [place], [address] Phone [phone], Fax [phone]

## 2024-03-02 ENCOUNTER — Other Ambulatory Visit (INDEPENDENT_AMBULATORY_CARE_PROVIDER_SITE_OTHER)

## 2024-03-02 ENCOUNTER — Ambulatory Visit (INDEPENDENT_AMBULATORY_CARE_PROVIDER_SITE_OTHER): Payer: Medicare Other | Admitting: Orthopedic Surgery

## 2024-03-02 DIAGNOSIS — S72102D Unspecified trochanteric fracture of left femur, subsequent encounter for closed fracture with routine healing: Secondary | ICD-10-CM

## 2024-03-02 NOTE — Progress Notes (Signed)
 Orthopedic Surgery Post-operative Office Visit   Procedure: left intertrochanteric hip fracture CMN Date of Surgery: 12/05/2023 (~3 months post-op)   Assessment: Patient is a 89 y.o. who still having some groin pain particularly when getting up out of bed in the morning.  Pain is controlled with Advil and gets better as the day goes on.  Pain has improved since she was last seen.     Plan: -No activity restrictions at this time -Can stop ASA for DVT ppx -Okay to submerge wound at this time -Weight bearing as tolerated -Pain management: Advil -Return to office in 3 months, x-rays needed at next visit: AP/lateral hip   ___________________________________________________________________________     Subjective: Patient still having some groin pain particularly when getting up pursing in the morning.  She says it responds well to Advil.  She does not have as much pain as the day goes on.  She feels her pain has improved since she was last seen in the office.  She has not noticed any redness or drainage around her incisions. Ambulating with walker.    Objective:   General: no acute distress, appropriate affect Neurologic: alert, answering questions appropriately, following commands Respiratory: unlabored breathing on room air Skin: incisions are well healed   MSK (LLE): EHL/TA/GSC intact, pain with internal rotation at the hip with no pain to remainder of range of motion, sensation intact to light touch in sural/saphenous/deep peroneal/superficial peroneal/tibial nerve distributions, foot warm well-perfused, palpable DP pulse   Imaging: X-rays of the left hip from 03/02/2024 were independently reviewed and interpreted, showing fracture reduction and alignment have been well-maintained since last films on 01/20/2024.  Short cephalomedullary rod in place.  Lag screws are well centered within the femoral head.  No lucency seen around the lag screws or the interlocking screw.  No new fracture  seen.     Patient name: Cynthia Gilmore Patient MRN: [id number] Date of visit: 03/02/24

## 2024-03-04 ENCOUNTER — Ambulatory Visit: Attending: Cardiology | Admitting: Cardiology

## 2024-03-04 ENCOUNTER — Encounter: Payer: Self-pay | Admitting: Cardiology

## 2024-03-04 VITALS — BP 150/72 | HR 54 | Ht 65.5 in | Wt 138.4 lb

## 2024-03-04 DIAGNOSIS — Q244 Congenital subaortic stenosis: Secondary | ICD-10-CM | POA: Diagnosis not present

## 2024-03-04 DIAGNOSIS — I471 Supraventricular tachycardia, unspecified: Secondary | ICD-10-CM | POA: Diagnosis not present

## 2024-03-04 DIAGNOSIS — I10 Essential (primary) hypertension: Secondary | ICD-10-CM

## 2024-03-04 MED ORDER — TELMISARTAN 80 MG PO TABS: 80.0000 mg | ORAL_TABLET | Freq: Every day | ORAL | 3 refills | Status: AC

## 2024-03-04 MED ORDER — SPIRONOLACTONE 50 MG PO TABS: 50.0000 mg | ORAL_TABLET | Freq: Every day | ORAL | 3 refills | Status: AC

## 2024-03-04 NOTE — Patient Instructions (Addendum)
 Medication Instructions:  Stop Losartan  Start Telmisartan 80 mg daily Increase Aldactone to 50 mg daily Continue all other medications *If you need a refill on your cardiac medications before your next appointment, please call your pharmacy*  Lab Work: None ordered  Testing/Procedures: None ordered  Follow-Up: At [facility], you and your health needs are our priority.  As part of our continuing mission to provide you with exceptional heart care, our providers are all part of one team.  This team includes your primary Cardiologist (physician) and Advanced Practice Providers or APPs (Physician Assistants and Nurse Practitioners) who all work together to provide you with the care you need, when you need it.  Your next appointment:  6 months   Call in July to schedule Oct appointment     Provider:  Dr.Jordan   We recommend signing up for the patient portal called "MyChart".  Sign up information is provided on this After Visit Summary.  MyChart is used to connect with patients for Virtual Visits (Telemedicine).  Patients are able to view lab/test results, encounter notes, upcoming appointments, etc.  Non-urgent messages can be sent to your provider as well.   To learn more about what you can do with MyChart, go to [email or web address].        1st Floor: - Lobby - Registration  - Pharmacy  - Lab - Cafe  2nd Floor: - PV Lab - Diagnostic Testing (echo, CT, nuclear med)  3rd Floor: - Vacant  4th Floor: - TCTS (cardiothoracic surgery) - AFib Clinic - Structural Heart Clinic - Vascular Surgery  - Vascular Ultrasound  5th Floor: - HeartCare Cardiology (general and EP) - Clinical Pharmacy for coumadin, hypertension, lipid, weight-loss medications, and med management appointments    Valet parking services will be available as well.

## 2024-03-27 ENCOUNTER — Other Ambulatory Visit: Payer: Self-pay | Admitting: Cardiology

## 2024-06-01 ENCOUNTER — Ambulatory Visit: Admitting: Orthopedic Surgery

## 2024-06-08 ENCOUNTER — Ambulatory Visit: Admitting: Orthopedic Surgery

## 2024-06-08 ENCOUNTER — Other Ambulatory Visit (INDEPENDENT_AMBULATORY_CARE_PROVIDER_SITE_OTHER)

## 2024-06-08 DIAGNOSIS — S72102D Unspecified trochanteric fracture of left femur, subsequent encounter for closed fracture with routine healing: Secondary | ICD-10-CM

## 2024-06-08 MED ORDER — GABAPENTIN 300 MG PO CAPS: 300.0000 mg | ORAL_CAPSULE | Freq: Three times a day (TID) | ORAL | 0 refills | Status: DC

## 2024-06-08 NOTE — Progress Notes (Addendum)
 Orthopedic Surgery Post-operative Office Visit   Procedure: left intertrochanteric hip fracture CMN Date of Surgery: 12/05/2023 (~6 months post-op)   Assessment: Patient is a 89 y.o. who is not having any hip pain at this point.  She is ambulating with a walker.  However, since she was last seen she has developed pain in the lateral aspect of her bilateral thighs and legs, suspect radiculopathy     Plan: -No activity restrictions at this time -Okay to submerge wound at this time -Weight bearing as tolerated -Can continue with the Advil, prescribed gabapentin  for additional pain relief -If she is not doing any better at her next visit, we will get an MRI of the lumbar spine to evaluate for radiculopathy -Return to office in 6 weeks, x-rays needed at next visit: AP/lateral/flex/ex lumbar ___________________________________________________________________________     Subjective: Patient has noticed resolution of her hip pain.  She is ambulating with a walker.  Since she was last seen, she has developed pain radiating along the lateral aspect of her thighs and legs.  She does have a history of chronic back pain but did not have this radiating pain previously.  There is no trauma or injury that preceded the onset of this pain.  She has not noticed any weakness.  No bowel or bladder continence.  No saddle anesthesia.   Objective:   General: no acute distress, appropriate affect Neurologic: alert, answering questions appropriately, following commands Respiratory: unlabored breathing on room air Skin: incisions are well healed   MSK (spine):  -Strength exam      Left  Right EHL    5/5  5/5 TA    5/5  5/5 GSC    5/5  5/5 Knee extension  5/5  5/5 Hip flexion   5/5  5/5  -Sensory exam    Sensation intact to light touch in L3-S1 nerve distributions of bilateral lower extremities  -Achilles DTR: 1/4 on the left, 1/4 on the right -Patellar tendon DTR: 1/4 on the left, 1/4 on the  right  -Straight leg raise: negative bilaterally -Clonus: no beats bilaterally  -Left hip exam: Pain with internal rotation at the hip, no pain through remainder of range of motion, negative Stinchfield, negative FABER -Right hip exam: No pain through range of motion, negative Stinchfield, negative FABER   Imaging: X-rays of the left hip from 06/08/2024 were independently reviewed and interpreted, showing unchanged alignment since last films on 03/02/2024.  Short intramedullary rod in place.  No lucency seen around the lag screw or the interlocking screw.  No new fracture seen.     Patient name: Cynthia Gilmore Patient MRN: [id number] Date of visit: 06/08/24  Pre-operative Scores   VAS leg: 10/10  6 Month Postoperative Scores  VAS leg: 5/10

## 2024-07-20 ENCOUNTER — Ambulatory Visit: Admitting: Orthopedic Surgery

## 2024-07-30 ENCOUNTER — Other Ambulatory Visit: Payer: Self-pay | Admitting: Cardiology

## 2024-08-01 ENCOUNTER — Other Ambulatory Visit (INDEPENDENT_AMBULATORY_CARE_PROVIDER_SITE_OTHER)

## 2024-08-01 ENCOUNTER — Ambulatory Visit: Admitting: Orthopedic Surgery

## 2024-08-01 DIAGNOSIS — S72102D Unspecified trochanteric fracture of left femur, subsequent encounter for closed fracture with routine healing: Secondary | ICD-10-CM

## 2024-08-01 DIAGNOSIS — M545 Low back pain, unspecified: Secondary | ICD-10-CM | POA: Diagnosis not present

## 2024-08-01 NOTE — Progress Notes (Signed)
 Orthopedic Surgery Post-operative Office Visit   Procedure: left intertrochanteric hip fracture CMN Date of Surgery: 12/05/2023 (~8 months post-op)   Assessment: Patient is a 89 y.o. who is not having any significant pain at this point.  She does have some pain over the incision particularly the lag screw incision if she is laying on that side.  Her radiating leg pain has resolved   Plan: -No activity restrictions at this time -Okay to submerge wound at this time -Weight bearing as tolerated -Since pain is doing better, would not recommend MRI at this time -Can use topical voltaren gel or lidocaine  over her incisions  -Return to office in 3 months, x-rays needed at next visit: left hip AP/lateral ___________________________________________________________________________     Subjective: Patient is doing better since she was last seen in the office.  She is not having any significant hip pain with ambulation.  She is not having any radiating leg pain.  She is having no pain in the right lower extremity.  She does have some pain over the incisions if she lays on that side.  She notices particular pain over the 1 year of the trochanteric bursa where the lag screws were inserted.  Otherwise, she has no complaints at this time.  She is getting around and able to do everything that she wants to do.   Objective:   General: no acute distress, appropriate affect Neurologic: alert, answering questions appropriately, following commands Respiratory: unlabored breathing on room air Skin: incisions are well healed   MSK (spine):   -Strength exam                                                   Left                  Right EHL                              5/5                  5/5 TA                                 5/5                  5/5 GSC                             5/5                  5/5 Knee extension            5/5                  5/5 Hip flexion                    5/5                   5/5   -Sensory exam                           Sensation intact to light touch in L3-S1 nerve distributions of bilateral lower  extremities   -Left hip exam: No pain through range of motion, negative Stinchfield, negative FABER -Right hip exam: No pain through range of motion, negative Stinchfield, negative FABER   Imaging: X-rays of the left hip from 06/08/2024 were previously independently reviewed and interpreted, showing unchanged alignment since last films on 03/02/2024.  Short intramedullary rod in place.  No lucency seen around the lag screw or the interlocking screw.  No new fracture seen.  XRs of the lumbar spine from 08/01/2024 were independently reviewed and interpreted, showing disc height loss at L4/5.  Facet arthropathy seen at L4/5 and L5/S1.  No other significant degenerative changes seen.  No evidence of instability on flexion/extension views.  No fracture or dislocation seen.     Patient name: Cynthia Gilmore Patient MRN: [id number] Date of visit: 08/01/24   Pre-operative Scores   VAS leg: 10/10   6 Month Postoperative Scores   VAS leg: 5/10

## 2024-08-15 ENCOUNTER — Telehealth: Payer: Self-pay | Admitting: Internal Medicine

## 2024-08-15 NOTE — Telephone Encounter (Signed)
 Spoke with patient's daughter Reba)  Patient  has been on Budesonide  (3/day)  and about a month ago began having diarrhea multiple times per day.  No other changes in health. Scheduled an OV w/ [facility] for 11/14. Please advise.

## 2024-08-15 NOTE — Telephone Encounter (Signed)
 Received a call from patients daughter stating the current treatment plan being used is not proving effective, patients daughter is requesting a nurse fu cal to discuss options. Callback [phone] (vicky #2 daughter who will be with patient). Please review and advise  Thank you

## 2024-08-15 NOTE — Telephone Encounter (Signed)
 Contacted patient's daughter, Andres, with Dr Perry's recommendation for Immodium. Also advised new appt date with Dr Abran 08/24/24 @ 4 PM

## 2024-08-24 ENCOUNTER — Ambulatory Visit: Admitting: Internal Medicine

## 2024-09-13 ENCOUNTER — Ambulatory Visit (HOSPITAL_COMMUNITY)
Admission: RE | Admit: 2024-09-13 | Discharge: 2024-09-13 | Disposition: A | Discharge status: Home / Self Care | Source: Ambulatory Visit | Attending: Vascular Surgery | Admitting: Vascular Surgery

## 2024-09-13 ENCOUNTER — Other Ambulatory Visit (HOSPITAL_COMMUNITY): Payer: Self-pay | Admitting: Internal Medicine

## 2024-09-13 DIAGNOSIS — M79605 Pain in left leg: Secondary | ICD-10-CM | POA: Insufficient documentation

## 2024-10-07 ENCOUNTER — Ambulatory Visit: Admitting: Gastroenterology

## 2024-10-27 ENCOUNTER — Other Ambulatory Visit: Payer: Self-pay | Admitting: Cardiology

## 2024-11-02 ENCOUNTER — Ambulatory Visit: Admitting: Orthopedic Surgery

## 2024-12-08 ENCOUNTER — Other Ambulatory Visit (HOSPITAL_COMMUNITY): Payer: Self-pay | Admitting: Internal Medicine

## 2024-12-08 DIAGNOSIS — R1319 Other dysphagia: Secondary | ICD-10-CM

## 2024-12-15 ENCOUNTER — Encounter: Payer: Self-pay | Admitting: Neurology

## 2024-12-15 ENCOUNTER — Ambulatory Visit (HOSPITAL_COMMUNITY)

## 2024-12-15 ENCOUNTER — Encounter (HOSPITAL_COMMUNITY): Payer: Self-pay

## 2024-12-16 ENCOUNTER — Encounter: Payer: Self-pay | Admitting: Nurse Practitioner

## 2024-12-16 ENCOUNTER — Ambulatory Visit: Admitting: Nurse Practitioner

## 2024-12-16 DIAGNOSIS — K52832 Lymphocytic colitis: Secondary | ICD-10-CM

## 2024-12-16 DIAGNOSIS — K219 Gastro-esophageal reflux disease without esophagitis: Secondary | ICD-10-CM

## 2024-12-16 DIAGNOSIS — R197 Diarrhea, unspecified: Secondary | ICD-10-CM

## 2024-12-16 DIAGNOSIS — R1310 Dysphagia, unspecified: Secondary | ICD-10-CM | POA: Diagnosis not present

## 2024-12-16 DIAGNOSIS — E119 Type 2 diabetes mellitus without complications: Secondary | ICD-10-CM

## 2024-12-16 DIAGNOSIS — Z7984 Long term (current) use of oral hypoglycemic drugs: Secondary | ICD-10-CM

## 2024-12-16 NOTE — Patient Instructions (Addendum)
 _______________________________________________________  If your blood pressure at your visit was 140/90 or greater, please contact your primary care physician to follow up on this.  _______________________________________________________  If you are age 89 or older, your body mass index should be between 23-30. Your Body mass index is 22.8 kg/m. If this is out of the aforementioned range listed, please consider follow up with your Primary Care Provider.  If you are age 85 or younger, your body mass index should be between 19-25. Your Body mass index is 22.8 kg/m. If this is out of the aformentioned range listed, please consider follow up with your Primary Care Provider.   ________________________________________________________  The [facility] providers would like to encourage you to use MYCHART to communicate with providers for non-urgent requests or questions.  Due to long hold times on the telephone, sending your provider a message by [facility] may be a faster and more efficient way to get a response.  Please allow 48 business hours for a response.  Please remember that this is for non-urgent requests.  _______________________________________________________  [facility] is using a team-based approach to care.  Your team is made up of your doctor and two to three APPS. Our APPS (Nurse Practitioners and Physician Assistants) work with your physician to ensure care continuity for you. They are fully qualified to address your health concerns and develop a treatment plan. They communicate directly with your gastroenterologist to care for you. Seeing the Advanced Practice Practitioners on your physician's team can help you by facilitating care more promptly, often allowing for earlier appointments, access to diagnostic testing, procedures, and other specialty referrals.   Your provider has ordered [employer] stool testing for you. You have received a kit from our office today containing  all necessary supplies to complete this test. Please carefully read the stool collection instructions provided in the kit before opening the accompanying materials. In addition, be sure there is a label providing your full name and date of birth on the puritan opti-swab tube that is supplied in the kit (if you do not see a label with this information on your test tube, please make us  aware before test collection!). After completing the test, you should secure the purtian tube into the specimen biohazard bag. The [facility] E-Req sheet (including date and time of specimen collection) should be placed into the outside pocket of the specimen biohazard bag and returned to the [facility] lab (basement floor of [facility]) within 3 days of collection. Please make sure to give the specimen to a staff member at the lab. DO NOT leave the specimen on the counter.  If the specimen date and time (can be found in the upper right boxed portion of the sheet) are not filled out on the E-Req sheet, the test will NOT be performed.   Due to recent changes in healthcare laws, you may see the results of your imaging and laboratory studies on MyChart before your provider has had a chance to review them.  We understand that in some cases there may be results that are confusing or concerning to you. Not all laboratory results come back in the same time frame and the provider may be waiting for multiple results in order to interpret others.  Please give us  48 hours in order for your provider to thoroughly review all the results before contacting the office for clarification of your results.   Eat a green banana daily which contains pectin ( a natural constipating fiber)  Use Imodium two tablets daily as needed.  Hold if no bowel movement in 24 hours  Thank you for entrusting me with your care and choosing [facility].  Elida Shawl, NP

## 2024-12-16 NOTE — Progress Notes (Signed)
 "       12/16/2024 Cynthia Gilmore [id number] [birth year]  Primary Gastroenterologist: Dr. Abran   Chief Complaint: Diarrhea  History of Present Illness: Cynthia Gilmore is a 89 y.o. female with a history of hypertension, TIA, diabetes mellitus type 2, IBS, anxiety, depression, GERD, anxiety and lymphocytic colitis.   Discussed the use of AI scribe software for clinical note transcription with the patient, who gave verbal consent to proceed.  History of Present Illness Cynthia Gilmore is an 89 year old female with lymphocytic colitis and irritable bowel syndrome with diarrhea who presents for evaluation of worsening diarrhea and lower abdominal cramping.  She is accompanied by her daughter.  Complex gastrointestinal history includes lymphocytic colitis previously managed with budesonide  and irritable bowel syndrome with diarrhea predominance. Budesonide  was initially effective but lost efficacy over time and has been discontinued for at least two months. Symptoms have worsened since stopping budesonide . Patient and family report increased stress and grief related to her father's recent illness and passing.  Reports sharp, cramping pain localized to the very lower abdomen, onset after discontinuing budesonide . Diarrhea is frequent and urgent, especially in the mornings, with up to four or five episodes before noon. Difficulty reaching the bathroom in time and fear of incontinence, particularly outside the home, have led to significant anxiety about leaving the house. Denies blood in stool. Also notes upper abdominal rolling and growling sensations.  Manages diarrhea with Imodium, taking three tablets at a time primarily when leaving the house. Does not use Imodium at home. After taking Imodium, may not have a bowel movement for two to four days, though efficacy is inconsistent. Lower doses are ineffective. Stool softeners are often required after Imodium due to constipation. Sometimes  experiences fullness or pressure in the rectum when bowels do not move.  She passed a normal bowel formed stool yesterday.  She passed a normal brown formed stool earlier this morning then took 3 Imodium tablets later this afternoon prior to this office visit to avoid any unexpected bouts of diarrhea and incontinence.  Recently completed a seven-day course of antibiotics for a bladder infection, finishing the medication the previous Thursday night. Has had at least one other course of antibiotics for a bladder infection within the past few months. Laboratory work performed by her primary care provider on December 08, 2024 included thyroid  and kidney function tests, with thyroid  function reported as normal.  She has a history of lymphocytic colitis for which she has received budesonide  in the past.  He has noted increased reflux symptoms and has chronic dysphagia.  She questions if she can increase omeprazole  to twice daily.  She was seen by her PCP who ordered a barium swallow study has not yet been performed.  GI PROCEDURES:   Colonoscopy 07/29/2011: Nodular mucosa in the terminal ileum Possible proctitis in the rectum Internal hemorrhoids Normal colon, random biopsies taken Path report showed lymphocytic colitis  EGD 05/05/2012: Nodule in the antrum submucosal, 2 cm Moderate gastritis Rings, multiple in the total esophagus 2 cm hiatal hernia Surgical Surgical [P], esophagus, esophagus, bx - BENIGN SQUAMOUS SQUAMOUS MUCOSA. MUCOSA. - NO EVIDENCE EVIDENCE OF ACTIVE INFLAMMATION INFLAMMATION OR FUNGAL ORGANISMS. ORGANISMS. - NO EVIDENCE EVIDENCE OF EOSINOPHILIC EOSINOPHILIC ESOPHAGITIS. ESOPHAGITIS. 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  Review of Systems:   Constitutional: Negative for fever, sweats, chills or weight loss.  Respiratory: Negative for shortness of breath.   Cardiovascular: Negative for chest pain, palpitations and leg swelling.  Gastrointestinal: See HPI.  Musculoskeletal: Negative for back pain or muscle aches.  Neurological: Negative for dizziness, headaches or paresthesias.   Physical Exam: BP 130/78   Pulse 92   Ht 5' 5.5 (1.664 m)   Wt 139 lb 2 oz (63.1 kg)   BMI 22.80 kg/m  Wt Readings from Last 3 Encounters:  12/16/24 139 lb 2 oz (63.1 kg)  03/04/24 138 lb 6.4 oz (62.8 kg)  12/13/23 133 lb 13.1 oz (60.7 kg)    General: 89 year old female in no acute distress. Head: Normocephalic and atraumatic. Eyes: No scleral icterus. Conjunctiva pink . Ears: Normal auditory acuity. Mouth: Dentition intact. No ulcers or lesions.  Lungs: Clear throughout to auscultation. Heart: Regular rate and rhythm, no murmur. Abdomen: Soft, nontender and nondistended. No masses or hepatomegaly. Normal bowel sounds x 4 quadrants.  Rectal: Small noninflamed external hemorrhoids.  Hypopigmented patches to the anal area.  No stool/impaction in the rectal vault.  Nikkel CMA present at time of exam. Musculoskeletal: Symmetrical with no gross deformities. Extremities: No edema. Neurological: Alert oriented x 4. No focal deficits.  Psychological: Alert and cooperative. Normal mood and affect  Assessment and Recommendations:  89 year old female with a history of lymphocytic colitis with intermittent diarrhea. She passed a normal brown solid stool yesterday without taking any Imodium and passed a normal bowel movement earlier this morning then proactively took Imodium 3 tabs prior to her office visit to avoid any potential bouts of diarrhea/fecal incontinence.  Increase stress during the grieving process for the  loss of her husband may be a contributing factor. - Patient instructed to complete GI pathogen panel including C. difficile only if she has persistent diarrhea/loose stools - Diet as tolerated - Eat a green banana daily which contains pectin (a natural constipating fiber) - Imodium 1 tab to 2 tabs daily as needed, hold if no BM in 24 hours. - If diarrhea recurs and GI pathogen panel negative, may consider stopping metformin   Increased acid reflux.  On Omeprazole  40 mg once daily, patient request to increase to twice daily. - Okay to increase omeprazole  40 mg twice daily if tolerated.  If diarrhea occurs after increasing to twice daily would reduce back to once daily. - Patient to contact office if reflux symptoms persist or worsen  Chronic dysphagia.  PCP ordered a barium swallow study, not yet completed - EGD deferred for now, await barium swallow study results - See plan above  Recent UTI, treated with Cipro 500 mg twice daily x 7 days 12/07/2024  Diabetes mellitus type 2 on metformin             [1]  Current Outpatient Medications on File Prior to Visit  Medication Sig Dispense Refill   ALPRAZolam  (XANAX ) 0.25 MG tablet TAKE 1/2 TO 1 TABLET BY MOUTH ONCE DAILY AS NEEDED 30 tablet 0   ALPRAZolam  (XANAX ) 0.5 MG tablet Take 0.5 mg by mouth 2 (two) times daily.     Ascorbic Acid (VITAMIN C) 500 MG CAPS Take by mouth.     BUDESONIDE  PO Take 1 tablet by mouth every 6 (six) hours.     cyanocobalamin  100 MCG tablet 1 tablet Orally Once a day     desvenlafaxine (PRISTIQ)  50 MG 24 hr tablet Take 50 mg by mouth daily.     escitalopram  (LEXAPRO ) 20 MG tablet Take 20 mg by mouth daily.     fluticasone  (FLONASE ) 50 MCG/ACT nasal spray SHAKE LIQUID AND USE 1 SPRAY IN EACH NOSTRIL TWICE DAILY     furosemide (LASIX) 20 MG tablet Take 20 mg by mouth daily as needed.     glucose blood (ACCU-CHEK SMARTVIEW) test strip use 1 strip as directed In Vitro Once a day to test blood sugar     metFORMIN   (GLUCOPHAGE -XR) 500 MG 24 hr tablet TAKE 1 TABLET(500 MG) BY MOUTH TWICE DAILY WITH A MEAL 180 tablet 0   metoprolol  succinate (TOPROL -XL) 25 MG 24 hr tablet TAKE 1 TABLET(25 MG) BY MOUTH TWICE DAILY IN ADDITION TO YOUR 50 MG TABLET TO TOTAL 75 MG DAILY 90 tablet 1   metoprolol  succinate (TOPROL -XL) 50 MG 24 hr tablet TAKE 1 TABLET BY MOUTH IMMEDIATELY FOLLOWING A MEAL 90 tablet 3   Misc Natural Products (ESTROVEN ENERGY PO) Take 1 tablet by mouth daily.     Multiple Vitamin (MULTI VITAMIN) TABS 1 tablet Orally Once a day; Duration: 30 day(s)     omeprazole  (PRILOSEC) 40 MG capsule Take 40 mg by mouth every morning.     potassium citrate (UROCIT-K) 10 MEQ (1080 MG) SR tablet Take 10 mEq by mouth daily.     spironolactone  (ALDACTONE ) 50 MG tablet Take 1 tablet (50 mg total) by mouth daily. 90 tablet 3   telmisartan  (MICARDIS ) 80 MG tablet Take 1 tablet (80 mg total) by mouth daily. 90 tablet 3   traZODone (DESYREL) 50 MG tablet as needed.     No current facility-administered medications on file prior to visit.  [2]  Allergies Allergen Reactions   Alprazolam      REACTION: sleepy   Alum & Mag Hydroxide-Simeth Diarrhea   Amitriptyline  Hcl     Other Reaction(s): confusion, sleep walking   Amlodipine Besylate     REACTION: swelling   Amlodipine Besylate Swelling   Aripiprazole    Augmentin  [Amoxicillin -Pot Clavulanate] Diarrhea   Azithromycin    Bupropion Hcl     Other Reaction(s): insomnia   Buspirone Hcl Diarrhea   Cefaclor     REACTION: mouth blisters   Celecoxib     REACTION: drained   Cephalexin    Ciprofloxacin    Clonidine Hydrochloride     REACTION: headache, dizziness   Desvenlafaxine    Diphenoxylate-Atropine    Divalproex Sodium     REACTION: nausea,tremors,fatigue   Divalproex Sodium     Other Reaction(s): nausea/tremors/fatigue   Doxycycline    Entex     REACTION: nausea, headache   Est Estrogens-Methyltest     REACTION: headache   Fluoxetine Hcl     Fluticasone  Furoate     Other Reaction(s): Unknown   Furosemide    Hydrochlorothiazide    Hydrocodone-Acetaminophen      REACTION: headache   Lamotrigine     REACTION: mood swing,chills, nausea   Lansoprazole    Lisinopril     REACTION: cough   Lorazepam    Meloxicam     REACTION: nausea, diarrhea   Methylphenidate Hcl    Methylphenidate Hcl     Other Reaction(s): Unknown   Metronidazole    Mirtazapine     REACTION: swelling   Nabumetone     REACTION: nausea, headache   Nitrofuran Derivatives Nausea Only   Olmesartan Medoxomil    Omeprazole  Diarrhea   Other  Alum and Mag Hydroxide Simeth(Solution) GI Cocktail. Diarrhea    Oxycodone -Acetaminophen     Pantoprazole  Sodium    Paroxetine     REACTION: tremor, insomnia, nausea   Pravastatin  Sodium     Other Reaction(s): drained/no energy   Quetiapine    Quetiapine Fumarate     Other Reaction(s): Unknown   Rabeprazole Sodium     REACTION: diarrhea   Rofecoxib    Sertraline Hcl     REACTION: exreme confusion   Sulfamethoxazole-Trimethoprim     Other Reaction(s): passed out   Sumatriptan     REACTION: cardiologist says no   Telithromycin     REACTION: drained   Topamax     Confusion and mood swings   Topiramate     Other Reaction(s): confusion/mood swings   Tramadol-Acetaminophen     Trintellix [Vortioxetine] Other (See Comments)    Made things worst   Venlafaxine     REACTION: hypertension, nausea, headache   Verapamil     Headaches and hot flashes    Vilazodone Hcl     Other Reaction(s): Unknown   Vilazodone Hcl [Vilazodone Hcl]    "

## 2024-12-16 NOTE — Progress Notes (Signed)
 Noted.

## 2025-01-10 ENCOUNTER — Ambulatory Visit (HOSPITAL_COMMUNITY)

## 2025-02-28 ENCOUNTER — Ambulatory Visit: Payer: Self-pay | Admitting: Neurology
